# Patient Record
Sex: Male | Born: 1960 | Race: White | Hispanic: No | Marital: Married | Smoking: Never smoker
Health system: Southern US, Community
[De-identification: ages and names within clinical notes are randomized; demographics above are authoritative.]

## PROBLEM LIST (undated history)

## (undated) DIAGNOSIS — M5414 Radiculopathy, thoracic region: Secondary | ICD-10-CM

## (undated) DIAGNOSIS — K589 Irritable bowel syndrome without diarrhea: Secondary | ICD-10-CM

## (undated) DIAGNOSIS — M4802 Spinal stenosis, cervical region: Secondary | ICD-10-CM

## (undated) DIAGNOSIS — R0789 Other chest pain: Secondary | ICD-10-CM

## (undated) DIAGNOSIS — K760 Fatty (change of) liver, not elsewhere classified: Secondary | ICD-10-CM

## (undated) DIAGNOSIS — K219 Gastro-esophageal reflux disease without esophagitis: Secondary | ICD-10-CM

## (undated) DIAGNOSIS — E1110 Type 2 diabetes mellitus with ketoacidosis without coma: Secondary | ICD-10-CM

## (undated) DIAGNOSIS — K7460 Unspecified cirrhosis of liver: Secondary | ICD-10-CM

## (undated) DIAGNOSIS — R197 Diarrhea, unspecified: Secondary | ICD-10-CM

## (undated) DIAGNOSIS — R0600 Dyspnea, unspecified: Secondary | ICD-10-CM

## (undated) DIAGNOSIS — M1990 Unspecified osteoarthritis, unspecified site: Secondary | ICD-10-CM

## (undated) DIAGNOSIS — D696 Thrombocytopenia, unspecified: Secondary | ICD-10-CM

## (undated) DIAGNOSIS — M5410 Radiculopathy, site unspecified: Secondary | ICD-10-CM

## (undated) DIAGNOSIS — N289 Disorder of kidney and ureter, unspecified: Secondary | ICD-10-CM

## (undated) DIAGNOSIS — M5416 Radiculopathy, lumbar region: Secondary | ICD-10-CM

## (undated) DIAGNOSIS — G4730 Sleep apnea, unspecified: Secondary | ICD-10-CM

## (undated) DIAGNOSIS — E119 Type 2 diabetes mellitus without complications: Secondary | ICD-10-CM

## (undated) DIAGNOSIS — M542 Cervicalgia: Secondary | ICD-10-CM

## (undated) DIAGNOSIS — E1142 Type 2 diabetes mellitus with diabetic polyneuropathy: Secondary | ICD-10-CM

## (undated) DIAGNOSIS — M5126 Other intervertebral disc displacement, lumbar region: Secondary | ICD-10-CM

## (undated) DIAGNOSIS — C649 Malignant neoplasm of unspecified kidney, except renal pelvis: Secondary | ICD-10-CM

## (undated) DIAGNOSIS — R319 Hematuria, unspecified: Secondary | ICD-10-CM

## (undated) DIAGNOSIS — M76899 Other specified enthesopathies of unspecified lower limb, excluding foot: Secondary | ICD-10-CM

## (undated) DIAGNOSIS — I10 Essential (primary) hypertension: Secondary | ICD-10-CM

## (undated) DIAGNOSIS — S86301A Unspecified injury of muscle(s) and tendon(s) of peroneal muscle group at lower leg level, right leg, initial encounter: Secondary | ICD-10-CM

## (undated) DIAGNOSIS — M5417 Radiculopathy, lumbosacral region: Secondary | ICD-10-CM

## (undated) DIAGNOSIS — R1013 Epigastric pain: Secondary | ICD-10-CM

## (undated) DIAGNOSIS — C801 Malignant (primary) neoplasm, unspecified: Secondary | ICD-10-CM

## (undated) DIAGNOSIS — N433 Hydrocele, unspecified: Secondary | ICD-10-CM

## (undated) DIAGNOSIS — F419 Anxiety disorder, unspecified: Secondary | ICD-10-CM

## (undated) DIAGNOSIS — S86319A Strain of muscle(s) and tendon(s) of peroneal muscle group at lower leg level, unspecified leg, initial encounter: Secondary | ICD-10-CM

## (undated) DIAGNOSIS — M51369 Other intervertebral disc degeneration, lumbar region without mention of lumbar back pain or lower extremity pain: Secondary | ICD-10-CM

## (undated) DIAGNOSIS — M5136 Other intervertebral disc degeneration, lumbar region: Secondary | ICD-10-CM

## (undated) DIAGNOSIS — A498 Other bacterial infections of unspecified site: Secondary | ICD-10-CM

## (undated) DIAGNOSIS — M48061 Spinal stenosis, lumbar region without neurogenic claudication: Secondary | ICD-10-CM

## (undated) DIAGNOSIS — R51 Headache: Secondary | ICD-10-CM

## (undated) DIAGNOSIS — K30 Functional dyspepsia: Secondary | ICD-10-CM

## (undated) DIAGNOSIS — D691 Qualitative platelet defects: Secondary | ICD-10-CM

## (undated) DIAGNOSIS — R519 Headache, unspecified: Secondary | ICD-10-CM

## (undated) HISTORY — DX: Type 2 diabetes mellitus without complications: E11.9

## (undated) HISTORY — DX: Gastro-esophageal reflux disease without esophagitis: K21.9

## (undated) HISTORY — DX: Functional dyspepsia: K30

## (undated) HISTORY — PX: JOINT REPLACEMENT: SHX530

## (undated) HISTORY — PX: SPINE SURGERY: SHX786

## (undated) HISTORY — DX: Type 2 diabetes mellitus with ketoacidosis without coma: E11.10

## (undated) HISTORY — PX: OSTEOTOMY: SHX137

## (undated) HISTORY — DX: Other intervertebral disc degeneration, lumbar region: M51.36

## (undated) HISTORY — PX: COLONOSCOPY: SHX174

## (undated) HISTORY — DX: Fatty (change of) liver, not elsewhere classified: K76.0

## (undated) HISTORY — DX: Other intervertebral disc displacement, lumbar region: M51.26

## (undated) HISTORY — DX: Other bacterial infections of unspecified site: A49.8

## (undated) HISTORY — DX: Radiculopathy, thoracic region: M54.14

## (undated) HISTORY — PX: OTHER SURGICAL HISTORY: SHX169

## (undated) HISTORY — PX: BACK SURGERY: SHX140

## (undated) HISTORY — DX: Other specified enthesopathies of unspecified lower limb, excluding foot: M76.899

## (undated) HISTORY — DX: Malignant (primary) neoplasm, unspecified: C80.1

## (undated) HISTORY — DX: Essential (primary) hypertension: I10

## (undated) HISTORY — DX: Radiculopathy, lumbar region: M54.16

## (undated) HISTORY — DX: Radiculopathy, lumbosacral region: M54.17

## (undated) HISTORY — DX: Unspecified osteoarthritis, unspecified site: M19.90

## (undated) HISTORY — DX: Unspecified cirrhosis of liver: K74.60

## (undated) SURGERY — LEFT HEART CATH AND CORONARY ANGIOGRAPHY
Anesthesia: Moderate Sedation | Laterality: Right

---

## 2004-06-19 ENCOUNTER — Ambulatory Visit: Payer: Self-pay | Admitting: Family Medicine

## 2004-06-28 ENCOUNTER — Ambulatory Visit (HOSPITAL_COMMUNITY): Admission: RE | Admit: 2004-06-28 | Discharge: 2004-06-28 | Payer: Self-pay | Admitting: Neurosurgery

## 2006-11-28 ENCOUNTER — Ambulatory Visit: Payer: Self-pay

## 2007-06-16 ENCOUNTER — Ambulatory Visit: Payer: Self-pay | Admitting: Internal Medicine

## 2008-07-21 ENCOUNTER — Ambulatory Visit: Payer: Self-pay | Admitting: Unknown Physician Specialty

## 2008-08-31 ENCOUNTER — Ambulatory Visit: Payer: Self-pay | Admitting: Unknown Physician Specialty

## 2009-06-06 ENCOUNTER — Ambulatory Visit: Payer: Self-pay | Admitting: Internal Medicine

## 2010-05-14 DIAGNOSIS — C801 Malignant (primary) neoplasm, unspecified: Secondary | ICD-10-CM

## 2010-05-14 HISTORY — DX: Malignant (primary) neoplasm, unspecified: C80.1

## 2011-02-08 ENCOUNTER — Ambulatory Visit: Payer: Self-pay | Admitting: Family Medicine

## 2011-02-08 ENCOUNTER — Emergency Department: Payer: Self-pay | Admitting: Internal Medicine

## 2011-02-26 ENCOUNTER — Ambulatory Visit: Payer: Self-pay | Admitting: Urology

## 2011-03-16 ENCOUNTER — Ambulatory Visit: Payer: Self-pay | Admitting: Urology

## 2011-03-23 ENCOUNTER — Ambulatory Visit: Payer: Self-pay | Admitting: Urology

## 2011-04-12 ENCOUNTER — Ambulatory Visit: Payer: Self-pay | Admitting: Urology

## 2011-04-14 HISTORY — PX: NEPHRECTOMY: SHX65

## 2011-06-29 ENCOUNTER — Ambulatory Visit: Payer: Self-pay | Admitting: Unknown Physician Specialty

## 2011-07-03 LAB — PATHOLOGY REPORT

## 2011-07-24 DIAGNOSIS — M5414 Radiculopathy, thoracic region: Secondary | ICD-10-CM

## 2011-07-24 DIAGNOSIS — E119 Type 2 diabetes mellitus without complications: Secondary | ICD-10-CM | POA: Insufficient documentation

## 2011-07-24 HISTORY — DX: Radiculopathy, thoracic region: M54.14

## 2011-07-24 HISTORY — DX: Type 2 diabetes mellitus without complications: E11.9

## 2011-08-23 DIAGNOSIS — M48061 Spinal stenosis, lumbar region without neurogenic claudication: Secondary | ICD-10-CM | POA: Insufficient documentation

## 2011-08-23 DIAGNOSIS — M5136 Other intervertebral disc degeneration, lumbar region: Secondary | ICD-10-CM | POA: Insufficient documentation

## 2011-08-23 DIAGNOSIS — Q602 Renal agenesis, unspecified: Secondary | ICD-10-CM | POA: Insufficient documentation

## 2011-08-23 DIAGNOSIS — M51369 Other intervertebral disc degeneration, lumbar region without mention of lumbar back pain or lower extremity pain: Secondary | ICD-10-CM

## 2011-08-23 HISTORY — DX: Other intervertebral disc degeneration, lumbar region without mention of lumbar back pain or lower extremity pain: M51.369

## 2011-08-23 HISTORY — DX: Other intervertebral disc degeneration, lumbar region: M51.36

## 2011-09-05 DIAGNOSIS — M5416 Radiculopathy, lumbar region: Secondary | ICD-10-CM

## 2011-09-05 DIAGNOSIS — M5126 Other intervertebral disc displacement, lumbar region: Secondary | ICD-10-CM | POA: Insufficient documentation

## 2011-09-05 DIAGNOSIS — M51369 Other intervertebral disc degeneration, lumbar region without mention of lumbar back pain or lower extremity pain: Secondary | ICD-10-CM

## 2011-09-05 HISTORY — DX: Other intervertebral disc displacement, lumbar region: M51.26

## 2011-09-05 HISTORY — DX: Radiculopathy, lumbar region: M54.16

## 2011-09-05 HISTORY — DX: Other intervertebral disc degeneration, lumbar region without mention of lumbar back pain or lower extremity pain: M51.369

## 2011-10-30 ENCOUNTER — Ambulatory Visit: Payer: Self-pay | Admitting: Unknown Physician Specialty

## 2011-11-06 ENCOUNTER — Ambulatory Visit: Payer: Self-pay | Admitting: Unknown Physician Specialty

## 2011-12-18 DIAGNOSIS — M545 Low back pain, unspecified: Secondary | ICD-10-CM | POA: Insufficient documentation

## 2012-02-14 DIAGNOSIS — K30 Functional dyspepsia: Secondary | ICD-10-CM | POA: Insufficient documentation

## 2012-02-14 HISTORY — DX: Functional dyspepsia: K30

## 2012-02-18 ENCOUNTER — Ambulatory Visit: Payer: Self-pay | Admitting: Internal Medicine

## 2012-02-27 ENCOUNTER — Emergency Department: Payer: Self-pay | Admitting: Emergency Medicine

## 2012-02-27 LAB — COMPREHENSIVE METABOLIC PANEL
Albumin: 3.8 g/dL (ref 3.4–5.0)
Alkaline Phosphatase: 128 U/L (ref 50–136)
Anion Gap: 11 (ref 7–16)
BUN: 21 mg/dL — ABNORMAL HIGH (ref 7–18)
Bilirubin,Total: 0.5 mg/dL (ref 0.2–1.0)
Calcium, Total: 8.8 mg/dL (ref 8.5–10.1)
Chloride: 104 mmol/L (ref 98–107)
Co2: 24 mmol/L (ref 21–32)
Creatinine: 1.42 mg/dL — ABNORMAL HIGH (ref 0.60–1.30)
EGFR (African American): >60
EGFR (Non-African Amer.): 58 — ABNORMAL LOW
Glucose: 249 mg/dL — ABNORMAL HIGH (ref 65–99)
Osmolality: 289 (ref 275–301)
Potassium: 4.3 mmol/L (ref 3.5–5.1)
SGOT(AST): 34 U/L (ref 15–37)
SGPT (ALT): 53 U/L (ref 12–78)
Sodium: 139 mmol/L (ref 136–145)
Total Protein: 7.9 g/dL (ref 6.4–8.2)

## 2012-02-27 LAB — TROPONIN I: Troponin-I: <0.02 ng/mL

## 2012-02-27 LAB — CBC
HCT: 44 % (ref 40.0–52.0)
HGB: 15.3 g/dL (ref 13.0–18.0)
MCH: 30.1 pg (ref 26.0–34.0)
MCHC: 34.8 g/dL (ref 32.0–36.0)
MCV: 86 fL (ref 80–100)
Platelet: 211 10*3/uL (ref 150–440)
RBC: 5.09 10*6/uL (ref 4.40–5.90)
RDW: 13.7 % (ref 11.5–14.5)
WBC: 7.1 10*3/uL (ref 3.8–10.6)

## 2012-02-27 LAB — LIPASE, BLOOD: Lipase: 248 U/L (ref 73–393)

## 2012-02-27 LAB — CK TOTAL AND CKMB (NOT AT ARMC)
CK, Total: 125 U/L (ref 35–232)
CK-MB: 1 ng/mL (ref 0.5–3.6)

## 2012-03-31 ENCOUNTER — Ambulatory Visit: Payer: Self-pay | Admitting: Rheumatology

## 2012-04-03 ENCOUNTER — Ambulatory Visit: Payer: Self-pay | Admitting: Unknown Physician Specialty

## 2012-04-07 ENCOUNTER — Ambulatory Visit: Payer: Self-pay | Admitting: Unknown Physician Specialty

## 2012-05-02 ENCOUNTER — Ambulatory Visit: Payer: Self-pay | Admitting: Physical Medicine and Rehabilitation

## 2012-05-26 ENCOUNTER — Emergency Department: Payer: Self-pay | Admitting: Emergency Medicine

## 2012-05-26 LAB — COMPREHENSIVE METABOLIC PANEL
Albumin: 4 g/dL (ref 3.4–5.0)
Alkaline Phosphatase: 119 U/L (ref 50–136)
Anion Gap: 8 (ref 7–16)
BUN: 18 mg/dL (ref 7–18)
Bilirubin,Total: 0.8 mg/dL (ref 0.2–1.0)
Calcium, Total: 9.4 mg/dL (ref 8.5–10.1)
Chloride: 100 mmol/L (ref 98–107)
Co2: 25 mmol/L (ref 21–32)
Creatinine: 1.38 mg/dL — ABNORMAL HIGH (ref 0.60–1.30)
EGFR (African American): >60
EGFR (Non-African Amer.): 60 — ABNORMAL LOW
Glucose: 189 mg/dL — ABNORMAL HIGH (ref 65–99)
Osmolality: 273 (ref 275–301)
Potassium: 4.1 mmol/L (ref 3.5–5.1)
SGOT(AST): 59 U/L — ABNORMAL HIGH (ref 15–37)
SGPT (ALT): 99 U/L — ABNORMAL HIGH (ref 12–78)
Sodium: 133 mmol/L — ABNORMAL LOW (ref 136–145)
Total Protein: 8.5 g/dL — ABNORMAL HIGH (ref 6.4–8.2)

## 2012-05-26 LAB — URINALYSIS, COMPLETE
Bacteria: NONE SEEN
Bilirubin,UR: NEGATIVE
Blood: NEGATIVE
Glucose,UR: >=500 mg/dL (ref 0–75)
Leukocyte Esterase: NEGATIVE
Nitrite: NEGATIVE
Ph: 6 (ref 4.5–8.0)
Protein: 30
RBC,UR: 2 /HPF (ref 0–5)
Specific Gravity: 1.025 (ref 1.003–1.030)
Squamous Epithelial: NONE SEEN
WBC UR: 1 /HPF (ref 0–5)

## 2012-05-26 LAB — CBC
HCT: 49.2 % (ref 40.0–52.0)
HGB: 16.3 g/dL (ref 13.0–18.0)
MCH: 29.5 pg (ref 26.0–34.0)
MCHC: 33.1 g/dL (ref 32.0–36.0)
MCV: 89 fL (ref 80–100)
Platelet: 202 10*3/uL (ref 150–440)
RBC: 5.52 10*6/uL (ref 4.40–5.90)
RDW: 13.9 % (ref 11.5–14.5)
WBC: 8 10*3/uL (ref 3.8–10.6)

## 2012-05-26 LAB — LIPASE, BLOOD: Lipase: 282 U/L (ref 73–393)

## 2012-05-26 LAB — CK TOTAL AND CKMB (NOT AT ARMC)
CK, Total: 82 U/L (ref 35–232)
CK-MB: <0.5 ng/mL — ABNORMAL LOW (ref 0.5–3.6)

## 2012-05-26 LAB — TROPONIN I: Troponin-I: <0.02 ng/mL

## 2012-05-27 LAB — TROPONIN I: Troponin-I: <0.02 ng/mL

## 2012-07-11 ENCOUNTER — Ambulatory Visit: Payer: Self-pay | Admitting: Unknown Physician Specialty

## 2012-09-10 ENCOUNTER — Emergency Department: Payer: Self-pay | Admitting: Emergency Medicine

## 2012-09-27 ENCOUNTER — Ambulatory Visit: Payer: Self-pay | Admitting: Internal Medicine

## 2012-09-27 LAB — SYNOVIAL CELL COUNT + DIFF, W/ CRYSTALS
Basophil: 0 %
Crystals, Joint Fluid: NONE SEEN
Eosinophil: 5 %
Lymphocytes: 11 %
Neutrophils: 84 %
Nucleated Cell Count: 2553 /mm3
Other Cells BF: 0 %
Other Mononuclear Cells: 0 %

## 2012-10-01 LAB — BODY FLUID CULTURE

## 2012-10-11 ENCOUNTER — Ambulatory Visit: Payer: Self-pay | Admitting: Emergency Medicine

## 2012-10-15 ENCOUNTER — Ambulatory Visit: Payer: Self-pay | Admitting: Unknown Physician Specialty

## 2012-10-16 ENCOUNTER — Ambulatory Visit: Payer: Self-pay

## 2012-11-15 ENCOUNTER — Ambulatory Visit: Payer: Self-pay

## 2012-11-15 ENCOUNTER — Emergency Department: Payer: Self-pay | Admitting: Emergency Medicine

## 2012-11-15 LAB — COMPREHENSIVE METABOLIC PANEL
Albumin: 3.7 g/dL (ref 3.4–5.0)
Alkaline Phosphatase: 123 U/L (ref 50–136)
Anion Gap: 8 (ref 7–16)
BUN: 16 mg/dL (ref 7–18)
Bilirubin,Total: 0.6 mg/dL (ref 0.2–1.0)
Calcium, Total: 9.1 mg/dL (ref 8.5–10.1)
Chloride: 108 mmol/L — ABNORMAL HIGH (ref 98–107)
Co2: 26 mmol/L (ref 21–32)
Creatinine: 1.18 mg/dL (ref 0.60–1.30)
EGFR (African American): >60
EGFR (Non-African Amer.): >60
Glucose: 81 mg/dL (ref 65–99)
Osmolality: 283 (ref 275–301)
Potassium: 3.8 mmol/L (ref 3.5–5.1)
SGOT(AST): 24 U/L (ref 15–37)
SGPT (ALT): 31 U/L (ref 12–78)
Sodium: 142 mmol/L (ref 136–145)
Total Protein: 7.7 g/dL (ref 6.4–8.2)

## 2012-11-15 LAB — URINALYSIS, COMPLETE
Bacteria: NONE SEEN
Bilirubin,UR: NEGATIVE
Blood: NEGATIVE
Glucose,UR: NEGATIVE mg/dL (ref 0–75)
Hyaline Cast: 1
Ketone: NEGATIVE
Leukocyte Esterase: NEGATIVE
Nitrite: NEGATIVE
Ph: 5 (ref 4.5–8.0)
Protein: NEGATIVE
RBC,UR: 2 /HPF (ref 0–5)
Specific Gravity: 1.018 (ref 1.003–1.030)
Squamous Epithelial: 1
WBC UR: 1 /HPF (ref 0–5)

## 2012-11-15 LAB — CBC
HCT: 44 % (ref 40.0–52.0)
HGB: 15.1 g/dL (ref 13.0–18.0)
MCH: 29.8 pg (ref 26.0–34.0)
MCHC: 34.2 g/dL (ref 32.0–36.0)
MCV: 87 fL (ref 80–100)
Platelet: 165 10*3/uL (ref 150–440)
RBC: 5.06 10*6/uL (ref 4.40–5.90)
RDW: 13.8 % (ref 11.5–14.5)
WBC: 6.8 10*3/uL (ref 3.8–10.6)

## 2012-11-15 LAB — LIPASE, BLOOD: Lipase: 146 U/L (ref 73–393)

## 2012-11-15 LAB — PROTIME-INR
INR: 1
Prothrombin Time: 13 secs (ref 11.5–14.7)

## 2012-11-15 LAB — TROPONIN I: Troponin-I: <0.02 ng/mL

## 2012-11-15 LAB — APTT: Activated PTT: 28.1 secs (ref 23.6–35.9)

## 2012-12-03 ENCOUNTER — Encounter: Payer: Self-pay | Admitting: Family Medicine

## 2012-12-12 ENCOUNTER — Encounter: Payer: Self-pay | Admitting: Family Medicine

## 2013-01-12 ENCOUNTER — Encounter: Payer: Self-pay | Admitting: Family Medicine

## 2013-05-15 ENCOUNTER — Ambulatory Visit: Payer: Self-pay | Admitting: Physician Assistant

## 2013-05-15 LAB — RAPID STREP-A WITH REFLX: Micro Text Report: NEGATIVE

## 2013-05-17 LAB — BETA STREP CULTURE(ARMC)

## 2013-05-25 ENCOUNTER — Ambulatory Visit: Payer: Self-pay | Admitting: Physician Assistant

## 2013-05-25 LAB — RAPID STREP-A WITH REFLX: Micro Text Report: NEGATIVE

## 2013-05-27 LAB — BETA STREP CULTURE(ARMC)

## 2013-06-29 DIAGNOSIS — Z87448 Personal history of other diseases of urinary system: Secondary | ICD-10-CM | POA: Insufficient documentation

## 2013-07-25 ENCOUNTER — Ambulatory Visit: Payer: Self-pay | Admitting: Family Medicine

## 2013-07-25 LAB — URINALYSIS, COMPLETE
Bacteria: NEGATIVE
Bilirubin,UR: NEGATIVE
Glucose,UR: NEGATIVE mg/dL (ref 0–75)
Ketone: NEGATIVE
Leukocyte Esterase: NEGATIVE
Nitrite: NEGATIVE
Ph: 6 (ref 4.5–8.0)
RBC,UR: >30 /HPF (ref 0–5)
Specific Gravity: 1.02 (ref 1.003–1.030)
Squamous Epithelial: NONE SEEN

## 2013-07-25 LAB — RAPID INFLUENZA A&B ANTIGENS

## 2013-08-08 ENCOUNTER — Ambulatory Visit: Payer: Self-pay | Admitting: Family Medicine

## 2013-08-08 LAB — RAPID INFLUENZA A&B ANTIGENS

## 2013-08-26 DIAGNOSIS — M25559 Pain in unspecified hip: Secondary | ICD-10-CM | POA: Insufficient documentation

## 2013-08-26 DIAGNOSIS — M25579 Pain in unspecified ankle and joints of unspecified foot: Secondary | ICD-10-CM | POA: Insufficient documentation

## 2013-10-18 ENCOUNTER — Ambulatory Visit: Payer: Self-pay | Admitting: Emergency Medicine

## 2013-11-12 ENCOUNTER — Ambulatory Visit: Payer: Self-pay

## 2013-11-13 ENCOUNTER — Inpatient Hospital Stay: Payer: Self-pay | Admitting: Internal Medicine

## 2013-11-13 LAB — CBC WITH DIFFERENTIAL/PLATELET
Basophil #: 0 10*3/uL (ref 0.0–0.1)
Basophil %: 0.3 %
Eosinophil #: 0.1 10*3/uL (ref 0.0–0.7)
Eosinophil %: 1 %
HCT: 49.1 % (ref 40.0–52.0)
HGB: 16.8 g/dL (ref 13.0–18.0)
Lymphocyte #: 1.2 10*3/uL (ref 1.0–3.6)
Lymphocyte %: 9.7 %
MCH: 30.2 pg (ref 26.0–34.0)
MCHC: 34.3 g/dL (ref 32.0–36.0)
MCV: 88 fL (ref 80–100)
Monocyte #: 0.6 x10 3/mm (ref 0.2–1.0)
Monocyte %: 5.1 %
Neutrophil #: 10.4 10*3/uL — ABNORMAL HIGH (ref 1.4–6.5)
Neutrophil %: 83.9 %
Platelet: 202 10*3/uL (ref 150–440)
RBC: 5.57 10*6/uL (ref 4.40–5.90)
RDW: 14.2 % (ref 11.5–14.5)
WBC: 12.4 10*3/uL — ABNORMAL HIGH (ref 3.8–10.6)

## 2013-11-13 LAB — COMPREHENSIVE METABOLIC PANEL
Albumin: 4.3 g/dL (ref 3.4–5.0)
Alkaline Phosphatase: 122 U/L — ABNORMAL HIGH
Anion Gap: 8 (ref 7–16)
BUN: 23 mg/dL — ABNORMAL HIGH (ref 7–18)
Bilirubin,Total: 0.6 mg/dL (ref 0.2–1.0)
Calcium, Total: 9.4 mg/dL (ref 8.5–10.1)
Chloride: 105 mmol/L (ref 98–107)
Co2: 22 mmol/L (ref 21–32)
Creatinine: 1.21 mg/dL (ref 0.60–1.30)
EGFR (African American): >60
EGFR (Non-African Amer.): >60
Glucose: 175 mg/dL — ABNORMAL HIGH (ref 65–99)
Osmolality: 278 (ref 275–301)
Potassium: 4.4 mmol/L (ref 3.5–5.1)
SGOT(AST): 37 U/L (ref 15–37)
SGPT (ALT): 40 U/L (ref 12–78)
Sodium: 135 mmol/L — ABNORMAL LOW (ref 136–145)
Total Protein: 9.1 g/dL — ABNORMAL HIGH (ref 6.4–8.2)

## 2013-11-13 LAB — URINALYSIS, COMPLETE
Bacteria: NEGATIVE
Bilirubin,UR: NEGATIVE
Blood: NEGATIVE
Glucose,UR: NEGATIVE mg/dL (ref 0–75)
Ketone: NEGATIVE
Leukocyte Esterase: NEGATIVE
Nitrite: NEGATIVE
Ph: 6 (ref 4.5–8.0)
Protein: 100
Specific Gravity: 1.025 (ref 1.003–1.030)

## 2013-11-13 LAB — CLOSTRIDIUM DIFFICILE(ARMC)

## 2013-11-13 LAB — LIPASE, BLOOD: Lipase: 194 U/L (ref 73–393)

## 2013-11-15 LAB — CBC WITH DIFFERENTIAL/PLATELET
Basophil #: 0 10*3/uL (ref 0.0–0.1)
Basophil %: 0.4 %
Eosinophil #: 0.2 10*3/uL (ref 0.0–0.7)
Eosinophil %: 4.6 %
HCT: 43 % (ref 40.0–52.0)
HGB: 14.8 g/dL (ref 13.0–18.0)
Lymphocyte #: 1.5 10*3/uL (ref 1.0–3.6)
Lymphocyte %: 37.9 %
MCH: 30.4 pg (ref 26.0–34.0)
MCHC: 34.3 g/dL (ref 32.0–36.0)
MCV: 89 fL (ref 80–100)
Monocyte #: 0.3 x10 3/mm (ref 0.2–1.0)
Monocyte %: 7.6 %
Neutrophil #: 1.9 10*3/uL (ref 1.4–6.5)
Neutrophil %: 49.5 %
Platelet: 165 10*3/uL (ref 150–440)
RBC: 4.87 10*6/uL (ref 4.40–5.90)
RDW: 13.8 % (ref 11.5–14.5)
WBC: 3.9 10*3/uL (ref 3.8–10.6)

## 2013-11-15 LAB — WBCS, STOOL

## 2013-11-15 LAB — STOOL CULTURE

## 2013-11-16 LAB — BASIC METABOLIC PANEL
Anion Gap: 6 — ABNORMAL LOW (ref 7–16)
BUN: 14 mg/dL (ref 7–18)
Calcium, Total: 8.3 mg/dL — ABNORMAL LOW (ref 8.5–10.1)
Chloride: 108 mmol/L — ABNORMAL HIGH (ref 98–107)
Co2: 27 mmol/L (ref 21–32)
Creatinine: 1.2 mg/dL (ref 0.60–1.30)
EGFR (African American): >60
EGFR (Non-African Amer.): >60
Glucose: 111 mg/dL — ABNORMAL HIGH (ref 65–99)
Osmolality: 282 (ref 275–301)
Potassium: 4 mmol/L (ref 3.5–5.1)
Sodium: 141 mmol/L (ref 136–145)

## 2013-11-26 ENCOUNTER — Inpatient Hospital Stay: Payer: Self-pay | Admitting: Internal Medicine

## 2013-11-26 LAB — URINALYSIS, COMPLETE
Bacteria: NONE SEEN
Bilirubin,UR: NEGATIVE
Blood: NEGATIVE
Glucose,UR: NEGATIVE mg/dL (ref 0–75)
Ketone: NEGATIVE
Leukocyte Esterase: NEGATIVE
Nitrite: NEGATIVE
Ph: 5 (ref 4.5–8.0)
Protein: NEGATIVE
RBC,UR: NONE SEEN /HPF (ref 0–5)
Specific Gravity: 1.026 (ref 1.003–1.030)
Squamous Epithelial: NONE SEEN
WBC UR: 1 /HPF (ref 0–5)

## 2013-11-26 LAB — CBC WITH DIFFERENTIAL/PLATELET
Basophil #: 0 10*3/uL (ref 0.0–0.1)
Basophil %: 0.2 %
Eosinophil #: 0.1 10*3/uL (ref 0.0–0.7)
Eosinophil %: 0.9 %
HCT: 50.2 % (ref 40.0–52.0)
HGB: 16.6 g/dL (ref 13.0–18.0)
Lymphocyte #: 0.9 10*3/uL — ABNORMAL LOW (ref 1.0–3.6)
Lymphocyte %: 8.8 %
MCH: 29.8 pg (ref 26.0–34.0)
MCHC: 33.1 g/dL (ref 32.0–36.0)
MCV: 90 fL (ref 80–100)
Monocyte #: 0.5 x10 3/mm (ref 0.2–1.0)
Monocyte %: 5.3 %
Neutrophil #: 8.6 10*3/uL — ABNORMAL HIGH (ref 1.4–6.5)
Neutrophil %: 84.8 %
Platelet: 191 10*3/uL (ref 150–440)
RBC: 5.58 10*6/uL (ref 4.40–5.90)
RDW: 13.8 % (ref 11.5–14.5)
WBC: 10.1 10*3/uL (ref 3.8–10.6)

## 2013-11-26 LAB — COMPREHENSIVE METABOLIC PANEL
Albumin: 4.2 g/dL (ref 3.4–5.0)
Alkaline Phosphatase: 90 U/L
Anion Gap: 6 — ABNORMAL LOW (ref 7–16)
BUN: 25 mg/dL — ABNORMAL HIGH (ref 7–18)
Bilirubin,Total: 1 mg/dL (ref 0.2–1.0)
Calcium, Total: 9.5 mg/dL (ref 8.5–10.1)
Chloride: 102 mmol/L (ref 98–107)
Co2: 27 mmol/L (ref 21–32)
Creatinine: 1.29 mg/dL (ref 0.60–1.30)
EGFR (African American): >60
EGFR (Non-African Amer.): >60
Glucose: 138 mg/dL — ABNORMAL HIGH (ref 65–99)
Osmolality: 277 (ref 275–301)
Potassium: 4.2 mmol/L (ref 3.5–5.1)
SGOT(AST): 27 U/L (ref 15–37)
SGPT (ALT): 49 U/L (ref 12–78)
Sodium: 135 mmol/L — ABNORMAL LOW (ref 136–145)
Total Protein: 9 g/dL — ABNORMAL HIGH (ref 6.4–8.2)

## 2013-11-26 LAB — LIPASE, BLOOD: Lipase: 170 U/L (ref 73–393)

## 2013-11-27 LAB — CBC WITH DIFFERENTIAL/PLATELET
Basophil #: 0 10*3/uL (ref 0.0–0.1)
Basophil %: 0.3 %
Eosinophil #: 0.4 10*3/uL (ref 0.0–0.7)
Eosinophil %: 6.2 %
HCT: 41.7 % (ref 40.0–52.0)
HGB: 13.7 g/dL (ref 13.0–18.0)
Lymphocyte #: 2.3 10*3/uL (ref 1.0–3.6)
Lymphocyte %: 37.1 %
MCH: 29.8 pg (ref 26.0–34.0)
MCHC: 32.8 g/dL (ref 32.0–36.0)
MCV: 91 fL (ref 80–100)
Monocyte #: 0.6 x10 3/mm (ref 0.2–1.0)
Monocyte %: 10.2 %
Neutrophil #: 2.9 10*3/uL (ref 1.4–6.5)
Neutrophil %: 46.2 %
Platelet: 154 10*3/uL (ref 150–440)
RBC: 4.59 10*6/uL (ref 4.40–5.90)
RDW: 13.9 % (ref 11.5–14.5)
WBC: 6.3 10*3/uL (ref 3.8–10.6)

## 2013-11-27 LAB — COMPREHENSIVE METABOLIC PANEL
Albumin: 3.2 g/dL — ABNORMAL LOW (ref 3.4–5.0)
Alkaline Phosphatase: 60 U/L
Anion Gap: 0 — ABNORMAL LOW (ref 7–16)
BUN: 20 mg/dL — ABNORMAL HIGH (ref 7–18)
Bilirubin,Total: 0.7 mg/dL (ref 0.2–1.0)
Calcium, Total: 8 mg/dL — ABNORMAL LOW (ref 8.5–10.1)
Chloride: 109 mmol/L — ABNORMAL HIGH (ref 98–107)
Co2: 27 mmol/L (ref 21–32)
Creatinine: 1.23 mg/dL (ref 0.60–1.30)
EGFR (African American): >60
EGFR (Non-African Amer.): >60
Glucose: 112 mg/dL — ABNORMAL HIGH (ref 65–99)
Osmolality: 275 (ref 275–301)
Potassium: 3.9 mmol/L (ref 3.5–5.1)
SGOT(AST): 13 U/L — ABNORMAL LOW (ref 15–37)
SGPT (ALT): 34 U/L (ref 12–78)
Sodium: 136 mmol/L (ref 136–145)
Total Protein: 6.7 g/dL (ref 6.4–8.2)

## 2013-11-30 LAB — PROTIME-INR
INR: 1.1
Prothrombin Time: 14.3 secs (ref 11.5–14.7)

## 2013-11-30 LAB — CBC WITH DIFFERENTIAL/PLATELET
Basophil #: 0 10*3/uL (ref 0.0–0.1)
Basophil %: 0.7 %
Eosinophil #: 0.3 10*3/uL (ref 0.0–0.7)
Eosinophil %: 5.4 %
HCT: 43.7 % (ref 40.0–52.0)
HGB: 14.5 g/dL (ref 13.0–18.0)
Lymphocyte #: 1.3 10*3/uL (ref 1.0–3.6)
Lymphocyte %: 27.1 %
MCH: 29.4 pg (ref 26.0–34.0)
MCHC: 33.2 g/dL (ref 32.0–36.0)
MCV: 89 fL (ref 80–100)
Monocyte #: 0.4 x10 3/mm (ref 0.2–1.0)
Monocyte %: 8 %
Neutrophil #: 2.9 10*3/uL (ref 1.4–6.5)
Neutrophil %: 58.8 %
Platelet: 179 10*3/uL (ref 150–440)
RBC: 4.94 10*6/uL (ref 4.40–5.90)
RDW: 13.4 % (ref 11.5–14.5)
WBC: 4.9 10*3/uL (ref 3.8–10.6)

## 2013-12-02 LAB — PATHOLOGY REPORT

## 2013-12-17 DIAGNOSIS — R197 Diarrhea, unspecified: Secondary | ICD-10-CM | POA: Insufficient documentation

## 2014-01-07 ENCOUNTER — Ambulatory Visit: Payer: Self-pay | Admitting: Physician Assistant

## 2014-01-07 LAB — CLOSTRIDIUM DIFFICILE(ARMC)

## 2014-01-09 LAB — STOOL CULTURE

## 2014-02-04 DIAGNOSIS — A498 Other bacterial infections of unspecified site: Secondary | ICD-10-CM

## 2014-02-04 HISTORY — DX: Other bacterial infections of unspecified site: A49.8

## 2014-02-27 ENCOUNTER — Ambulatory Visit: Payer: Self-pay | Admitting: Physician Assistant

## 2014-02-27 LAB — COMPREHENSIVE METABOLIC PANEL
Albumin: 4 g/dL (ref 3.4–5.0)
Alkaline Phosphatase: 107 U/L
Anion Gap: 9 (ref 7–16)
BUN: 26 mg/dL — ABNORMAL HIGH (ref 7–18)
Bilirubin,Total: 0.5 mg/dL (ref 0.2–1.0)
Calcium, Total: 9.3 mg/dL (ref 8.5–10.1)
Chloride: 104 mmol/L (ref 98–107)
Co2: 28 mmol/L (ref 21–32)
Creatinine: 1.38 mg/dL — ABNORMAL HIGH (ref 0.60–1.30)
EGFR (African American): >60
EGFR (Non-African Amer.): 58 — ABNORMAL LOW
Glucose: 131 mg/dL — ABNORMAL HIGH (ref 65–99)
Osmolality: 288 (ref 275–301)
Potassium: 4 mmol/L (ref 3.5–5.1)
SGOT(AST): 17 U/L (ref 15–37)
SGPT (ALT): 32 U/L
Sodium: 141 mmol/L (ref 136–145)
Total Protein: 7.9 g/dL (ref 6.4–8.2)

## 2014-02-27 LAB — CBC WITH DIFFERENTIAL/PLATELET
Basophil #: 0.1 10*3/uL (ref 0.0–0.1)
Basophil %: 1 %
Eosinophil #: 0.1 10*3/uL (ref 0.0–0.7)
Eosinophil %: 1.8 %
HCT: 46.2 % (ref 40.0–52.0)
HGB: 15.5 g/dL (ref 13.0–18.0)
Lymphocyte #: 1.8 10*3/uL (ref 1.0–3.6)
Lymphocyte %: 32.6 %
MCH: 29.8 pg (ref 26.0–34.0)
MCHC: 33.5 g/dL (ref 32.0–36.0)
MCV: 89 fL (ref 80–100)
Monocyte #: 0.4 x10 3/mm (ref 0.2–1.0)
Monocyte %: 8.1 %
Neutrophil #: 3.1 10*3/uL (ref 1.4–6.5)
Neutrophil %: 56.5 %
Platelet: 182 10*3/uL (ref 150–440)
RBC: 5.2 10*6/uL (ref 4.40–5.90)
RDW: 13.4 % (ref 11.5–14.5)
WBC: 5.5 10*3/uL (ref 3.8–10.6)

## 2014-04-11 ENCOUNTER — Ambulatory Visit: Payer: Self-pay | Admitting: Unknown Physician Specialty

## 2014-04-11 LAB — CLOSTRIDIUM DIFFICILE(ARMC)

## 2014-04-12 ENCOUNTER — Ambulatory Visit: Payer: Self-pay | Admitting: Unknown Physician Specialty

## 2014-04-14 LAB — STOOL CULTURE

## 2014-04-18 ENCOUNTER — Ambulatory Visit: Admit: 2014-04-18 | Disposition: A | Discharge status: Home / Self Care | Payer: Self-pay | Admitting: Internal Medicine

## 2014-04-18 ENCOUNTER — Ambulatory Visit: Payer: Self-pay | Admitting: Internal Medicine

## 2014-05-02 ENCOUNTER — Ambulatory Visit: Payer: Self-pay | Admitting: Physician Assistant

## 2014-05-02 LAB — RAPID STREP-A WITH REFLX: Micro Text Report: NEGATIVE

## 2014-05-05 LAB — BETA STREP CULTURE(ARMC)

## 2014-05-31 ENCOUNTER — Observation Stay: Payer: Self-pay | Admitting: Internal Medicine

## 2014-05-31 LAB — URINALYSIS, COMPLETE
Bilirubin,UR: NEGATIVE
Blood: NEGATIVE
Glucose,UR: 50 mg/dL (ref 0–75)
Hyaline Cast: 2
Ketone: NEGATIVE
Leukocyte Esterase: NEGATIVE
Nitrite: NEGATIVE
Ph: 5 (ref 4.5–8.0)
Protein: 30
RBC,UR: 2 /HPF (ref 0–5)
Specific Gravity: 1.029 (ref 1.003–1.030)
Squamous Epithelial: <1
WBC UR: 3 /HPF (ref 0–5)

## 2014-05-31 LAB — CBC WITH DIFFERENTIAL/PLATELET
Basophil #: 0 10*3/uL (ref 0.0–0.1)
Basophil %: 0.6 %
Eosinophil #: 0.2 10*3/uL (ref 0.0–0.7)
Eosinophil %: 2 %
HCT: 46.6 % (ref 40.0–52.0)
HGB: 15.6 g/dL (ref 13.0–18.0)
Lymphocyte #: 1.8 10*3/uL (ref 1.0–3.6)
Lymphocyte %: 22.9 %
MCH: 29.8 pg (ref 26.0–34.0)
MCHC: 33.5 g/dL (ref 32.0–36.0)
MCV: 89 fL (ref 80–100)
Monocyte #: 0.8 x10 3/mm (ref 0.2–1.0)
Monocyte %: 9.9 %
Neutrophil #: 5 10*3/uL (ref 1.4–6.5)
Neutrophil %: 64.6 %
Platelet: 174 10*3/uL (ref 150–440)
RBC: 5.24 10*6/uL (ref 4.40–5.90)
RDW: 13.8 % (ref 11.5–14.5)
WBC: 7.7 10*3/uL (ref 3.8–10.6)

## 2014-05-31 LAB — LIPASE, BLOOD: Lipase: 231 U/L (ref 73–393)

## 2014-05-31 LAB — COMPREHENSIVE METABOLIC PANEL
Albumin: 3.5 g/dL (ref 3.4–5.0)
Alkaline Phosphatase: 149 U/L — ABNORMAL HIGH
Anion Gap: 8 (ref 7–16)
BUN: 20 mg/dL — ABNORMAL HIGH (ref 7–18)
Bilirubin,Total: 0.4 mg/dL (ref 0.2–1.0)
Calcium, Total: 8.3 mg/dL — ABNORMAL LOW (ref 8.5–10.1)
Chloride: 106 mmol/L (ref 98–107)
Co2: 26 mmol/L (ref 21–32)
Creatinine: 1.1 mg/dL (ref 0.60–1.30)
EGFR (African American): >60
EGFR (Non-African Amer.): >60
Glucose: 167 mg/dL — ABNORMAL HIGH (ref 65–99)
Osmolality: 286 (ref 275–301)
Potassium: 4.1 mmol/L (ref 3.5–5.1)
SGOT(AST): 32 U/L (ref 15–37)
SGPT (ALT): 40 U/L
Sodium: 140 mmol/L (ref 136–145)
Total Protein: 7.4 g/dL (ref 6.4–8.2)

## 2014-05-31 LAB — TROPONIN I: Troponin-I: <0.02 ng/mL

## 2014-06-01 LAB — CBC WITH DIFFERENTIAL/PLATELET
Basophil #: 0 10*3/uL (ref 0.0–0.1)
Basophil %: 0.4 %
Eosinophil #: 0.1 10*3/uL (ref 0.0–0.7)
Eosinophil %: 0.7 %
HCT: 43.1 % (ref 40.0–52.0)
HGB: 14.3 g/dL (ref 13.0–18.0)
Lymphocyte #: 1.7 10*3/uL (ref 1.0–3.6)
Lymphocyte %: 23.3 %
MCH: 29.5 pg (ref 26.0–34.0)
MCHC: 33.1 g/dL (ref 32.0–36.0)
MCV: 89 fL (ref 80–100)
Monocyte #: 0.5 x10 3/mm (ref 0.2–1.0)
Monocyte %: 7.1 %
Neutrophil #: 5 10*3/uL (ref 1.4–6.5)
Neutrophil %: 68.5 %
Platelet: 162 10*3/uL (ref 150–440)
RBC: 4.85 10*6/uL (ref 4.40–5.90)
RDW: 14 % (ref 11.5–14.5)
WBC: 7.3 10*3/uL (ref 3.8–10.6)

## 2014-06-03 LAB — BASIC METABOLIC PANEL
Anion Gap: 7 (ref 7–16)
BUN: 17 mg/dL (ref 7–18)
Calcium, Total: 8.4 mg/dL — ABNORMAL LOW (ref 8.5–10.1)
Chloride: 108 mmol/L — ABNORMAL HIGH (ref 98–107)
Co2: 26 mmol/L (ref 21–32)
Creatinine: 1.19 mg/dL (ref 0.60–1.30)
EGFR (African American): >60
EGFR (Non-African Amer.): >60
Glucose: 101 mg/dL — ABNORMAL HIGH (ref 65–99)
Osmolality: 283 (ref 275–301)
Potassium: 3.9 mmol/L (ref 3.5–5.1)
Sodium: 141 mmol/L (ref 136–145)

## 2014-06-03 LAB — HEMOGLOBIN: HGB: 14.8 g/dL (ref 13.0–18.0)

## 2014-06-03 LAB — HEMATOCRIT: HCT: 44 % (ref 40.0–52.0)

## 2014-06-03 LAB — STOOL CULTURE

## 2014-07-02 ENCOUNTER — Ambulatory Visit: Payer: Self-pay | Admitting: Gastroenterology

## 2014-07-29 ENCOUNTER — Ambulatory Visit
Admit: 2014-07-29 | Disposition: A | Discharge status: Home / Self Care | Payer: Self-pay | Attending: Gastroenterology | Admitting: Gastroenterology

## 2014-08-04 ENCOUNTER — Encounter
Admit: 2014-08-04 | Disposition: A | Discharge status: Home / Self Care | Payer: Self-pay | Attending: Gastroenterology | Admitting: Gastroenterology

## 2014-08-06 DIAGNOSIS — M542 Cervicalgia: Secondary | ICD-10-CM | POA: Insufficient documentation

## 2014-08-13 ENCOUNTER — Ambulatory Visit
Admit: 2014-08-13 | Disposition: A | Discharge status: Home / Self Care | Payer: Self-pay | Attending: Gastroenterology | Admitting: Gastroenterology

## 2014-08-13 ENCOUNTER — Encounter
Admit: 2014-08-13 | Disposition: A | Discharge status: Home / Self Care | Payer: Self-pay | Attending: Gastroenterology | Admitting: Gastroenterology

## 2014-08-21 ENCOUNTER — Ambulatory Visit
Admit: 2014-08-21 | Disposition: A | Discharge status: Home / Self Care | Payer: Self-pay | Attending: Family Medicine | Admitting: Family Medicine

## 2014-08-21 LAB — RAPID STREP-A WITH REFLX: Micro Text Report: NEGATIVE

## 2014-08-24 LAB — BETA STREP CULTURE(ARMC)

## 2014-08-25 ENCOUNTER — Ambulatory Visit
Admit: 2014-08-25 | Disposition: A | Discharge status: Home / Self Care | Payer: Self-pay | Attending: Gastroenterology | Admitting: Gastroenterology

## 2014-08-25 LAB — CLOSTRIDIUM DIFFICILE(ARMC)

## 2014-08-29 LAB — STOOL CULTURE

## 2014-09-04 NOTE — Consult Note (Signed)
Chief Complaint:  Subjective/Chief Complaint patient seen for lower abdominal pain.  Minimal nausea today, no emesis.  continues to have bilateral lower abdominal pain.   VITAL SIGNS/ANCILLARY NOTES: **Vital Signs.:   18-Jul-15 07:51  Vital Signs Type Q 8hr  Temperature Temperature (F) 97.6  Celsius 36.4  Pulse Pulse 49  Respirations Respirations 18  Systolic BP Systolic BP 056  Diastolic BP (mmHg) Diastolic BP (mmHg) 77  Mean BP 92  Pulse Ox % Pulse Ox % 97  Pulse Ox Activity Level  At rest  Oxygen Delivery Room Air/ 21 %   Brief Assessment:  Cardiac Regular   Respiratory clear BS   Gastrointestinal details normal Soft  Nondistended  Bowel sounds normal  No rebound tenderness  tender to palpation in the upper epigastrum and bilateral lower abdomen   Lab Results: Hepatic:  17-Jul-15 04:43   Bilirubin, Total 0.7  Alkaline Phosphatase 60 (45-117 NOTE: New Reference Range 04/03/13)  SGPT (ALT) 34  SGOT (AST)  13  Total Protein, Serum 6.7  Albumin, Serum  3.2  Routine Chem:  17-Jul-15 04:43   Glucose, Serum  112  BUN  20  Creatinine (comp) 1.23  Sodium, Serum 136  Potassium, Serum 3.9  Chloride, Serum  109  CO2, Serum 27  Calcium (Total), Serum  8.0  Osmolality (calc) 275  eGFR (African American) >60  eGFR (Non-African American) >60 (eGFR values <78m/min/1.73 m2 may be an indication of chronic kidney disease (CKD). Calculated eGFR is useful in patients with stable renal function. The eGFR calculation will not be reliable in acutely ill patients when serum creatinine is changing rapidly. It is not useful in  patients on dialysis. The eGFR calculation may not be applicable to patients at the low and high extremes of body sizes, pregnant women, and vegetarians.)  Anion Gap  0  Routine Hem:  16-Jul-15 10:38   WBC (CBC) 10.1  17-Jul-15 04:43   WBC (CBC) 6.3  RBC (CBC) 4.59  Hemoglobin (CBC) 13.7  Hematocrit (CBC) 41.7  Platelet Count (CBC) 154  MCV 91  MCH  29.8  MCHC 32.8  RDW 13.9  Neutrophil % 46.2  Lymphocyte % 37.1  Monocyte % 10.2  Eosinophil % 6.2  Basophil % 0.3  Neutrophil # 2.9  Lymphocyte # 2.3  Monocyte # 0.6  Eosinophil # 0.4  Basophil # 0.0 (Result(s) reported on 27 Nov 2013 at 05:30AM.)   Radiology Results: XRay:    16-Jul-15 12:56, Abdomen 3 Way Includes PA Chest  Abdomen 3 Way Includes PA Chest   REASON FOR EXAM:    vomiting, recent ? partial obstruction  COMMENTS:       PROCEDURE: DXR - DXR ABDOMEN 3-WAY (INCL PA CXR)  - Nov 26 2013 12:56PM     CLINICAL DATA:  Pain, vomiting.  Question partial obstruction.    EXAM:  ABDOMEN SERIES    COMPARISON:  Prior CT from 11/16/2013    FINDINGS:  The cardiac and mediastinal silhouettes are stable in size and  contour, and remain within normal limits.  The lungs are normally inflated. No airspace consolidation, pleural  effusion, or pulmonary edema is identified. There is no  pneumothorax.    Visualized bowel gas pattern is within normal limits without  evidence of obstruction or ileus. No abnormal bowel wall thickening.  Mild gaseous distention of the stomach noted. Surgical clips overlie  the right mid abdomen. No soft tissue mass. Left-sided  nephrolithiasis noted. No free intraperitoneal air.    No acute osseous  abnormality.     IMPRESSION:  1. Nonobstructive bowel gas pattern with no radiographic evidence of  acute intra-abdominal process.  2. No acute cardiopulmonary abnormality.  3. Left nephrolithiasis.      Electronically Signed    By: Jeannine Boga M.D.    On: 11/26/2013 13:15         Verified By: Neomia Glass, M.D.,  [place]:    16-Jul-15 13:48, US Kidney Bilateral  US Kidney Bilateral   REASON FOR EXAM:    recent stones on ct. now with flank pain, h/o right   nephrectomy  COMMENTS:       PROCEDURE: [place]  - US KIDNEY  - Nov 26 2013  1:48PM     CLINICAL DATA:  recent stones on ct. now with flank pain, h/o  right  nephrectomy    EXAM:  RENAL/URINARY TRACT ULTRASOUND COMPLETE    COMPARISON:  Prior CT from 11/16/2013    FINDINGS:  Right Kidney:    Surgically absent.    Left Kidney:    Length: 14.1 cm. Echogenicity within normal limits. Multiple  echogenic foci seen, the largest of whichmeasures 6 x 7 x 3 mm.  Findings are consistent with nephrolithiasis. No hydronephrosis.    Bladder:    Appears normal for degree of bladder distention.     IMPRESSION:  1. Left renal nephrolithiasis. No sonographic evidence of  hydronephrosis or obstructive uropathy.  2. Absent right kidney.      Electronically Signed    By: Jeannine Boga M.D.    On: 11/26/2013 13:53         Verified By: Neomia Glass, M.D.,   Assessment/Plan:  Assessment/Plan:  Assessment 1) lower abdominalpain and change of bowel habit.   2) history of non-etoh related cirrhosis and grade 2 esophageal varices, fatty liver per patient. not currently on variceal bleeding prophylaxis.   Plan 1) continue current, if no improvement tomorrow, will plan for luminal evaluation via egd and colonoscopy monday with Dr Rayann Heman.   Electronic Signatures: Loistine Simas (MD)  (Signed 18-Jul-15 18:17)  Authored: Chief Complaint, VITAL SIGNS/ANCILLARY NOTES, Brief Assessment, Lab Results, Radiology Results, Assessment/Plan   Last Updated: 18-Jul-15 18:17 by Loistine Simas (MD)

## 2014-09-04 NOTE — Consult Note (Signed)
Pt doing a little better, diarrhea definitely better, still has pain in abd.  Only one loose stool today. VSS afeb. Chest clear, abd tender but better than yesterday.  Making progress.    Electronic Signatures: Manya Silvas (MD)  (Signed on 04-Jul-15 10:02)  Authored  Last Updated: 04-Jul-15 10:02 by Manya Silvas (MD)

## 2014-09-04 NOTE — Consult Note (Signed)
PATIENT NAME:  Anthony Fuller, Anthony Fuller MR#:  [id number] DATE OF BIRTH:  [birth year]  DATE OF CONSULTATION:  11/27/2013  REFERRING PHYSICIAN:  Dustin Flock, MD CONSULTING PHYSICIAN:  Arther Dames, MD  REASON FOR CONSULTATION: Abdominal pain, nausea, vomiting and diarrhea.   HISTORY OF PRESENT ILLNESS: Anthony Fuller is a 54 year old male with a past medical history notable for cirrhosis, diabetes, irritable bowel syndrome and chronic back pain presenting to the Emergency Room for evaluation of nausea, vomiting, diarrhea and abdominal pain. Of note, Anthony Fuller was just recently admitted to the hospital in early July for similar symptoms. At that time he was seen by Dr. Vira Agar with GI who thought this was likely either gastroenteritis versus irritable bowel syndrome.    He had some improvement. However, over the past several days his symptoms have worsened. He feels as though his abdominal pain has worsened and he also developed some episodes of nausea, vomiting and diarrhea.   At this time, the nausea, the vomiting and the diarrhea have totally resolved. His main complaint at this time is abdominal pain. The abdominal pain is bilateral, starts in the right lower quadrant and then it travels up his abdomen towards the right upper quadrant and left upper quadrant regions. He also describes a funny sensation in his epigastrium traveling up into his chest.   He reports that the diarrhea, nausea and vomiting can come on intermittently. He cannot identify any potential triggers for these.   Of note, during the previous hospitalization, he did have a CT of abdomen and pelvis that did not show any etiology for his symptoms. His last upper endoscopy was in 2013. That showed grade II esophageal varices, gastritis and large gastric folds and duodenitis. He also had a colonoscopy in 2013, which showed 2 small sessile polyps and internal hemorrhoids; otherwise, it was a normal exam.   PAST MEDICAL HISTORY: 1.  Cirrhosis  with esophageal varices.  2.  Osteoarthritis.  3.  Chronic back pain.  4.  Gout.  5.  Hypertension.  6.  Diabetes.  7.  Nephrectomy.  8.  Irritable bowel syndrome.   ALLERGIES: DILAUDID AND FLOXIN.   FAMILY HISTORY: No GI malignancy.   SOCIAL HISTORY: He denies alcohol and tobacco.   HOME MEDICATIONS: 1.  Viagra daily.  2.  Tramadol 50 mg t.i.d.  3.  Tizanidine 4 mg t.i.d.  4.  Pioglitazone 50 mg daily.  5.  Omeprazole 40 mg daily.  6.  Metformin 500 mg daily.  7.  Lyrica 75 daily.  8.  Halcion 0.125 sublingual 3 times a day.  9.  Fluticasone nasal spray daily.  10.  Duloxetine 30 mg daily.  11.  Ceftin 500 mg.   PHYSICAL EXAMINATION: VITAL SIGNS: Temperature 97.8, pulse 55, respirations 17, blood pressure 113/74 and 97% on room air.  GENERAL: Alert and oriented times 4.  No acute distress. Appears stated age. HEENT: Normocephalic/atraumatic. Extraocular movements are intact. Anicteric. NECK: Soft, supple. JVP appears normal. No adenopathy. CHEST: Clear to auscultation. No wheeze or crackle. Respirations unlabored. HEART: Regular. No murmur, rub, or gallop.  Normal S1 and S2. ABDOMEN: Positive for mild diffuse tenderness to palpation. Nondistended.  Normal active bowel sounds in all 4 quadrants.  No organomegaly. No masses EXTREMITIES: No swelling, well perfused. SKIN: No rash or lesion. Skin color, texture, turgor normal. NEUROLOGICAL: Grossly intact. PSYCHIATRIC: Normal tone and affect. MUSCULOSKELETAL: No joint swelling or erythema.   DIAGNOSTIC DATA: Laboratory: Sodium 136, creatinine 1.23, BUN 20, potassium 3.9. Lipase is  normal. Liver enzymes are normal. Albumin 3.2. White count 6.3, hemoglobin 13.7, hematocrit 42 and his platelets are 154,000   Imaging: See HPI for recent CT.  He did have a KUB this visit which showed a nonobstructive bowel gas pattern.   He also had an ultrasound of his kidneys that showed left nephrolithiasis with no obstruction.    ASSESSMENT: Nausea, vomiting, diarrhea and abdominal pain: Now that he is in the hospital his nausea, vomiting and diarrhea have now resolved. His one residual symptom is abdominal pain. This abdominal pain is bilateral, right lower quadrant and left lower quadrant, and travels upwards through his abdomen. It is present 24/7, not related to anything. It seems this is more of a chronic condition for Anthony Fuller.   Given his recent CT 2 weeks ago, that was nondiagnostic, and the nature of the pain, I think it is very unlikely that we would find an etiology for this pain on upper endoscopy or colonoscopy. He also had both of these tests back in 2013 with no etiology.   RECOMMENDATIONS: I would like to treat him symptomatically at this time to see if we can get some improvement. This will include a proton pump inhibitors, sucralfate, and an antinausea spasmatic such as Bentyl.   If he does not have improvement over the weekend, then we will likely perform EGD and colonoscopy on Monday. However, I have talked to him and let him know that I feel these studies are likely to be very extremely low yield for these symptoms and I do not expect to find an etiology on these tests.  Thank you for this consult.   ____________________________ Arther Dames, MD mr:sb D: 11/27/2013 14:09:16 ET T: 11/27/2013 14:32:55 ET JOB#: [id number]  cc: Arther Dames, MD, <Dictator> Mellody Life MD ELECTRONICALLY SIGNED 12/03/2013 10:20

## 2014-09-04 NOTE — Consult Note (Signed)
Chief Complaint:  Subjective/Chief Complaint seen for abdominal pain. tolerating po, some epigastric pain and continued bilateral lower abdominal pain.  No evidence of bleeding, no bm overnight.   VITAL SIGNS/ANCILLARY NOTES: **Vital Signs.:   19-Jul-15 07:53  Vital Signs Type Q 8hr  Temperature Temperature (F) 97.7  Celsius 36.5  Temperature Source oral  Pulse Pulse 52  Respirations Respirations 18  Systolic BP Systolic BP 333  Diastolic BP (mmHg) Diastolic BP (mmHg) 71  Mean BP 84  Pulse Ox % Pulse Ox % 94  Pulse Ox Activity Level  At rest  Oxygen Delivery Room Air/ 21 %   Brief Assessment:  Cardiac Regular   Respiratory clear BS   Gastrointestinal details normal Soft  Nondistended  Bowel sounds normal  No rebound tenderness  tender to palpation across the lower abdomen and epigastric area.   Assessment/Plan:  Assessment/Plan:  Assessment 1) abdominal pain, lower and epigastric. uncertain etiology.   Plan 1) egd and colonoscopy.  I have discussed the risks benefits and complications of proceedures to include not limited to bleeding infection perforationa nd sedation and he wishes to proceed.   Electronic Signatures: Loistine Simas (MD)  (Signed 19-Jul-15 15:01)  Authored: Chief Complaint, VITAL SIGNS/ANCILLARY NOTES, Brief Assessment, Assessment/Plan   Last Updated: 19-Jul-15 15:01 by Loistine Simas (MD)

## 2014-09-04 NOTE — Discharge Summary (Signed)
PATIENT NAME:  Anthony Fuller, Anthony Fuller MR#:  [id number] DATE OF BIRTH:  08/29/1960  DATE OF ADMISSION:  11/26/2013 DATE OF DISCHARGE:  11/30/2013  ADMITTING DIAGNOSIS: Abdominal pain in bilateral lower quadrants as well as groin, diarrhea and vomiting.   DISCHARGE DIAGNOSES:  1.  Bilateral abdominal pain, diarrhea and vomiting felt to be due to irritable bowel syndrome. No clear etiology found. The patient has had CT scan during his previous hospitalization. During this hospitalization, he underwent EGD and colonoscopy with no abnormality noted. Gastroenterology feels that this is possibly irritable bowel syndrome. The patient may need referral to a GI clinic at [facility] if symptoms persist.  2.  History of gout.  3.  Osteoarthritis.  4.  Chronic back pain.  5.  History of esophageal varices and liver cirrhosis.  6.  Hypertension.  7.  Diabetes.  8.  Nephrectomy in 2012, right-sided, due to carcinoma.  9.  Back surgery.   PERTINENT LABS AND EVALUATIONS: Lipase was 170. Glucose 138, BUN 25, creatinine 1.29, sodium 135, potassium 4.2, chloride 102, CO2 27, calcium 9.5. LFTs were normal, except albumin of 9. WBC 10.1, hemoglobin 16.6, platelet count 191,000.   Abdominal x-rays showed nonobstructive bowel gas pattern. No acute cardiopulmonary abnormality. Left nephrolithiasis.   Kidney ultrasound showed left renal nephrolithiasis. No sonographic evidence of hydronephrosis. Absent right kidney.   Urinalysis was negative.   EGD showed normal stomach, nodular mucosa in the duodenal bulb, which was biopsied.   Colonoscopy showed internal hemorrhoids. No other pathology was identified.   HOSPITAL COURSE: Please refer to the H and P done by the admitting physician. The patient is a 54 year old white male who was recently hospitalized on 07/03 with same type of abdominal pain. At that time, he was thought to have possible irritable bowel syndrome and discharged home. He had a CT scan at that time which showed  nonspecific edema in his bowel. The patient during this hospitalization presented with same complaints. He was seen in consultation by GI due to persistent symptoms. He underwent an EGD and a colonoscopy, which were nonrevealing. GI recommended trying some Bentyl. The patient may need further evaluation as an outpatient for his persistent symptoms and may need referral to [facility]. At this time, he is doing better and is stable for discharge.   DISCHARGE MEDICATIONS: Omeprazole 40 daily, fluticasone 50 mcg 2 sprays daily, Lyrica 75 p.o. daily, duloxetine 30 daily, tizanidine 4 mg 1 tab p.o. t.i.d., chlordiazepoxide clidinium 5 mg/2.5 two tabs t.i.d. as needed, Viagra 50 mg 1 tab p.o. daily as needed, pioglitazone 15 at bedtime as needed, mupirocin topical 2% apply topically to effected area 3 times a day as needed, tramadol 50 mg 1 tab p.o. t.i.d. as needed, metformin 500 two tabs at bedtime, Lyrica 100 at bedtime, acetaminophen/oxycodone 325/5 mg 1 tab p.o. q. 6 p.r.n. for pain, Lyrica 25/50 mg at bedtime, sucralfate 1 gram 10 mL q. 6, dicyclomine 10 mg 1 tab p.o. q. 8.   DISCHARGE DIET: Low sodium, low fat, low cholesterol, carbohydrate-controlled.  DISCHARGE ACTIVITY: As tolerated.   DISCHARGE FOLLOWUP: Follow up with primary MD in 1 to 2 weeks. Follow with [facility] in 2 to 4 weeks.  TIME SPENT: 35 minutes.  ____________________________ Lafonda Mosses Posey Pronto, MD shp:sb D: 12/01/2013 08:11:18 ET T: 12/01/2013 10:23:38 ET JOB#: [id number]  cc: Claudia Alvizo H. Posey Pronto, MD, <Dictator> Alric Seton MD ELECTRONICALLY SIGNED 12/02/2013 12:31

## 2014-09-04 NOTE — Consult Note (Signed)
PATIENT NAME:  Anthony Fuller, Anthony Fuller MR#:  [id number] DATE OF BIRTH:  20-Aug-1960  DATE OF CONSULTATION:  11/13/2013  REFERRING PHYSICIAN:  Johny Drilling, MD CONSULTING PHYSICIAN:  Manya Silvas, MD  HISTORY OF PRESENT ILLNESS:  The patient is a 54 year old white male known to me with a history of chronic back pain, arthritis, grade I esophageal varices and chronic abdominal pain felt to be probably irritable bowel but he has also had previous right nephrectomy and may have adhesions.   The patient had an onset of severe diarrhea, start off with stomach cramps and diarrhea and vomiting. He ate a ___meal_____ about 6:30 and had the onset of the illness about 2 hours later. He ate creamed potatoes and country fried steak that night. Since then, he has been to the bathroom, maybe 20 times or more, passing watery stools described as gray-brown water accompanied by very bad stomach cramps and chills but no fever. He had vomited several times at the beginning but stopped vomiting after that. He was seen in the ER and the decision was made to hydrate and admit to the hospital. I was asked to see him in consultation.   His last bowel movement was about an hour and a half ago. He says it is still like "turning on a faucet". He has pain and discomfort throughout his abdomen   He was on Augmentin couple of weeks ago for sinus infection and then he developed a rash on his legs, possibly related to a tick bite and he had been on cefuroxime for this. He has a target-like lesion on the right shin. It may be related to that.  He denies any fever. No hematemesis. No new medications.   PAST MEDICAL HISTORY:  1.  Gout.  2.  Osteoarthritis.  3.  Chronic back pain. 4.  Chronic abdominal pain.  5.  A history of grade I varices.  6.  Hypertension.  7.  Diabetes.  8.  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  9.  Back surgery.   ALLERGIES: 1.  DILAUDID  2.  FLOXIN.   MEDICATIONS:   1.   Viagra 50 mg daily as needed.  2.  Tramadol 50 mg 3 times a day p.r.n.  3.  Tizanidine 4 mg 3 times a day.  4.  Simvastatin 20 mg daily at bedtime. 5.  Pioglitazone 15 mg daily at bedtime.  6.  Omeprazole 40 mg a day. 7.  Mupirocin topical ointment twice a day.  8.  Lyrica 50 mg in the morning, 100 mg in the evening.  9.  Metformin 500 mg 2 every evening.  10.  Fluticasone 50 mcg inhalation 2 sprays a day. 11.  Duloxetine 30 mg a day.  12.  Librax 2 capsules 3 times a day.   FAMILY HISTORY:  Positive for hypertension.   SOCIAL HISTORY:  Nonsmoker, nondrinker, married and lives with his wife.   REVIEW OF SYSTEMS:  No fever. He has felt weak and washed out, occasional double vision at that time, but not recently. No changes in hearing. No asthma, wheezing or hemoptysis. No chest pains. No skipping irregular heartbeats No dysuria or hematuria. No polyuria. No known anemia or easy bruising. Does have a rash and a target lesion on the right shin.   PHYSICAL EXAMINATION:  GENERAL:  Somewhat mesomorphic white male in no acute distress.  HEENT:  Sclerae anicteric. Conjunctivae negative. Tongue negative.  HEAD:  Atraumatic.  CHEST:  Clear.  HEART:  No  murmurs, gallops, clicks or rubs.  ABDOMEN:  Bowel sounds are diminished. There is diffuse abdominal tenderness to mild percussion. Fullness in the right upper quadrant.  EXTREMITIES:  A target lesion on the right anterior shin,  no edema.  SKIN:  Warm and dry.  PSYCHIATRIC:  Mood and affect are appropriate.   LABORATORY, DIAGNOSTIC, AND RADIOLOGICAL DATA:  Glucose 175, BUN 23, creatinine 1.21, sodium 135, potassium 4.4, chloride 105, CO2 22, calcium 9.4, lipase 194, total protein 9.1, albumin 4.3, total bilirubin 0.6, alkaline phos 122, SGOT 37, SGPT 40. White blood cells 12.4, hemoglobin 16.8, platelet count 202. Stool shows no Campylobacter antigen and C. diff. test is negative for toxins A and B and negative for GDH antigen. Urinalysis shows  some protein, rare red cells, 0 to 5 white cells. A CAT scan of the abdomen and pelvis with contrast showed that the liver is diffusely decreased in attenuation consistent with fatty infiltration. The right kidney surgically removed. The left kidney is well visualized. No obstructive changes. The bladder is partially distended. Fluid is noted in the distal rectum. Mild prominence of small bowel is seen although no true obstructive changes are noted. Assessment:  Fluid throughout the large and small bowel representing a generalized enteritis.   ASSESSMENT:  Either acute onset of gastroenteritis of a viral nature or probable food poisoning. Recommend stool comprehensive culture as you are doing. Stool white cells and since C. difficile is negative, he can continue the Ceftin for now. If this is food poisoning, most likely it would be staphylococcus followed by Clostridium perfringens. I doubt salmonella. It could be a severe viral gastroenteritis. We will follow with you.   ____________________________ Manya Silvas, MD rte:jm D: 11/13/2013 15:53:53 ET T: 11/13/2013 17:05:30 ET JOB#: [id number]  cc: Manya Silvas, MD, <Dictator> [place], MD Manya Silvas MD ELECTRONICALLY SIGNED 11/24/2013 17:47

## 2014-09-04 NOTE — Consult Note (Signed)
Pt has hx of IBS, his diarrhea is much better, no bowel movement over night.  He is still tender in periumbilical area.  No nausea, vomiting or diarrhea reported.  VSS afebrile WBC 8.6, hgb 11.9. Met C neg except glucose of 180.  Would expect to be improved enough to maybe go home tomorrow.   Electronic Signatures: Manya Silvas (MD) (Signed on 05-Jul-15 11:06)  Authored   Last Updated: 05-Jul-15 11:13 by Manya Silvas (MD)

## 2014-09-04 NOTE — Discharge Summary (Signed)
PATIENT NAME:  Anthony Fuller, Anthony Fuller MR#:  [id number] DATE OF BIRTH:  1961-05-14  DATE OF ADMISSION:  11/13/2013 DATE OF DISCHARGE:  11/17/2013  ADMITTING PHYSICIAN:  Dr. Fritzi Mandes   DISCHARGING PHYSICIAN:  Dr. Gladstone Lighter  PRIMARY CARE PHYSICIAN: Dr. Johny Drilling   CONSULTATIONS IN THE HOSPITAL: GI consultation by Dr. Vira Agar  DISCHARGE DIAGNOSES:  1. Acute gastroenteritis.  2. Irritable bowel syndrome with chronic abdominal pain.  3. Diabetes mellitus.  4. Hypertension.  5. Peripheral neuropathy.  DISCHARGE HOME MEDICATIONS:   1. Omeprazole 40 mg p.o. daily.  2. Flonase nasal spray two sprays each nostril once a day.  3. Ceftin 500 mg p.o. b.i.d. for 6 more days.  4. Lyrica 75 mg p.o. in the evening.  5. Lyrica 25 mg p.o. once a day in the evening.  6. Cymbalta 30 mg p.o. daily.  7. Tizanidine 4 mg p.o. t.i.d.  8. Simvastatin 20 mg p.o. at bedtime.  9. Chlordiazepoxide with clidinium 5 mg/2.5 mg 2 capsules 3 times a day as needed.  10. Viagra 50 mg p.o. daily p.r.n.  11. Actos 15 mg p.o. daily at bedtime.  12. Mupirocin topical ointment 3 times a day.  13. Tramadol 50 mg p.o. t.i.d. p.r.n. for pain.  14. Lyrica 50 mg capsule once a day in the morning.  15. Percocet 5/325 mg q. 6 hours p.r.n. for pain.  16. Hyoscyamine 0.125 mg sublingual 3 times a day for pain.   DISCHARGE DIET:  Carbohydrate-controlled diet.   DISCHARGE ACTIVITY: As tolerated.    FOLLOWUP INSTRUCTIONS:  1. GI followup in 2-3 weeks.  2. PCP followup in 2 weeks.   LABS AND IMAGING STUDIES PRIOR TO DISCHARGE: Sodium 141, potassium 4.0, chloride 108, bicarbonate 27, BUN 14, creatinine 1.2, glucose 111, and calcium of 8.3.   WBC 3.9, hemoglobin 14.8, hematocrit 42.0, platelet count 165,000. CT of the abdomen and pelvis on 11/16/2013 showing normal organs in the abdomen. No free air or free fluid in the abdomen. No focal acute process demonstrated either in the abdomen or pelvis.  Stool cultures negative,  stool for Clostridium difficile is negative. He also had another CT of the abdomen and pelvis on admission which shows possible enteritis and small bowel and large bowel enteritis, status post right nephrectomy and nonobstructing left renal stones. Urinalysis negative for any infection. Lipase normal at 194, ALT 40, AST 37, alkaline phosphatase 122, total bilirubin 0.6, albumin of 4.3.   BRIEF HOSPITAL COURSE: This patient is a 54 year old, obese, Caucasian male with past medical history significant for IBS with chronic lower abdominal pain and chronic back pain, history of hypertension, diabetes, status post right nephrectomy secondary to renal cell carcinoma, presents secondary to abdominal pain and diarrhea.  1. Acute gastroenteritis with diarrhea. Initial CT abdomen showed to have possible enteritis.  Could have been triggered by food poisoning.  The patient was started on empiric Flagyl. His diarrhea, nausea, and vomiting has improved though he has persistent lower abdominal pain. Flagyl is being discontinued as repeat CT did not show any evidence of any inflammation and he is able to tolerate regular diet well. He was also seen by GI who agreed with current management.  2. Lower abdominal pain. The patient does have chronic abdominal pain per Dr. Vira Agar. He has IBS and takes pain medications at home all the time. The patient insisted on further workup.  He had repeat CTs done which did not show any worsening or any changes. The patient also wants to  get a colonoscopy done in the hospital. However, his recent colonoscopy in February 2013 only showed couple of polyps and there was no other acute indication for doing a colonoscopy at this time.  So, he is being started on pain medication, hyoscyamine, antispasmodic anticholinergic drug  and is being discharged home at this time.  3. Recent tick bite and rash. He was seen for possible cellulitis at the urgent care a few days before admission and he is on  Ceftin for the same and he will continue to finish off a 10-day course.   4. Diabetes mellitus. He is on Actos, well-controlled.  5. Peripheral neuropathy.  On Lyrica and Cymbalta.   His course has been otherwise uneventful in the hospital.   DISCHARGE CONDITION: Stable.   DISCHARGE DISPOSITION: Home.   TIME SPENT ON DISCHARGE: 40 minutes.    ____________________________ Gladstone Lighter, MD rk:dd D: 11/17/2013 16:00:13 ET T: 11/17/2013 18:53:03 ET JOB#: [id number]  cc: Gladstone Lighter, MD, <Dictator> Valera Castle, MD Unknown cc   Gladstone Lighter MD ELECTRONICALLY SIGNED 11/19/2013 10:54

## 2014-09-04 NOTE — H&P (Signed)
PATIENT NAME:  JAKOBY, MELENDREZ MR#:  [id number] DATE OF BIRTH:  [birth year]  DATE OF ADMISSION:  11/13/2013  PRIMARY CARE PHYSICIAN: Valera Castle, MD  CHIEF COMPLAINT: Abdominal pain and diarrhea since yesterday.  HISTORY OF PRESENT ILLNESS: Mr. Murdoch is a 54 year old obese Caucasian gentleman with past medical history of chronic back pain, arthritis and history of esophageal varices, who comes to the  Emergency Room after he started having watery stool since last night. The patient said he had noticed some blood on the toilet paper while he wiped it. Denied any frank blood in his stools. He had some abdominal pain and received about 12 mg of IV morphine and 200 mcg of IV fentanyl in the Emergency Room. He also had some nausea and vomiting earlier; however, he has not vomited at this time. His CT of the abdomen shows ileus and consistent also with enteritis. He is being admitted for further evaluation and management. The patient recently was at urgent care, finished up antibiotic of Augmentin for his sinus congestion. He is currently on cefuroxime for some rash in his lower extremities.   PAST MEDICAL HISTORY:  1. Gout.  2. Osteoarthritis.  3. Chronic back pain.  4. History of esophageal varices.  5. Hypertension.  6. Diabetes.  7. Nephrectomy in 2012, right-sided nephrectomy secondary to carcinoma.  8. Back surgery.   ALLERGIES: DILAUDID AND FLOXIN.   MEDICATIONS:  1. Viagra 50 mg p.o. daily as needed.  2. Tramadol 50 mg 3 times a day.  3. Tizanidine 4 mg 3 times a day.  4. Simvastatin 20 mg p.o. daily at bedtime.  5. Pioglitazone 15 mg p.o. daily at bedtime.  6. Omeprazole 40 mg p.o. daily.  7. Mupirocin topical ointment to affected area twice a day.  8. Lyrica 75 mg 1 capsule p.o. daily.  9. Lyrica 25 mg 2 capsules once a day.  10. Lyrica 25 mg 1 capsule once a day.  11. Fluticasone 50 mcg inhalation 2 sprays daily.  12. Duloxetine 30 mg p.o. daily.  13.  Chlordiazepoxide/clidinium 2 capsules 3 times a day.  14. Cefuroxime 500 mg b.i.d., this was started at urgent care on July 2nd for bilateral lower extremity rash.   FAMILY HISTORY: Positive for hypertension.   SOCIAL HISTORY: Married and lives with wife. Nonsmoker, nonalcoholic. Denies any drug use.   REVIEW OF SYSTEMS:  CONSTITUTIONAL: No fever. Positive for fatigue, weakness.  EYES: No blurred or double vision, glaucoma or cataract.  ENT: No tinnitus, ear pain, hearing loss or dysphagia.  RESPIRATORY: No cough, wheeze, hemoptysis. No COPD.  CARDIOVASCULAR: No chest pain, orthopnea, edema, dyspnea or arrhythmias.  GASTROINTESTINAL: Positive for nausea, vomiting and diarrhea along with abdominal pain. Positive for history of hemorrhoids.  GENITOURINARY: No dysuria, hematuria or frequency.  ENDOCRINE: No polyuria, nocturia or thyroid problems.  HEMATOLOGY: No anemia or easy bruising. SKIN: No acne, rash or lesion.  NEUROLOGIC: No CVA, TIA, dysarthria or tremors. PSYCHIATRIC: No anxiety, depression or bipolar disorder.  All other systems reviewed and negative.   PHYSICAL EXAMINATION: GENERAL: The patient is awake, alert, oriented x3, not in acute distress. VITAL SIGNS: Afebrile, pulse is 70, blood pressure is 135/89, saturation is 100% on room air.  HEENT: Atraumatic, normocephalic. Pupils are equal, round and reactive to light and accommodation. EOM intact. Oral mucosa is moist.  NECK: Supple. No JVD. No carotid bruits.  RESPIRATORY: Clear to auscultation bilaterally. No rales, rhonchi, respiratory distress or labored breathing.  CARDIOVASCULAR: Both the heart sounds  are normal. Rate and rhythm regular. PMI not lateralized. Chest nontender. Good pedal pulses, good femoral pulses. No lower extremity edema. ABDOMEN: Soft, obese. Tenderness present diffuse. No guarding, rigidity or organomegaly felt. Bowel sounds are hypoactive.  EXTREMITIES: Good pedal pulses, good femoral pulses. No  lower extremity edema.  SKIN: The patient does have a bulls-eye kind of rash, a "bulls-eye" lesion over on his right lower extremity along with maculopapular rash on bilateral lower extremities. He does have some areas of bug bite as well.   PSYCHIATRIC: The patient is awake, alert, oriented x3.   DIAGNOSTIC DATA:  C. difficile negative.  CT of the abdomen shows fluid throughout the large and small bowel which may represent some generalized enteritis. Mild fullness of the small bowel is noted, and a few loops are present and some generalized enteritis as well.  UA negative for UTI. White count is 12.4.  Sodium 135, BUN 23, creatinine 1.2. LFTs within normal limits. Lipase 194.  ASSESSMENT AND PLAN: The 54 year old Sir Mallis, with history of chronic back pain, history of diabetes and hypertension, who recently had sinus congestion and completed a course of Augmentin about a week ago, comes in now with:   1. Acute enteritis which is suspected due to antibiotic related; however, his C. difficile is negative. The patient has been having large volume diarrhea. Will continue aggressive IV fluids. Empiric Flagyl 500 p.o. t.i.d. I will have Dr. Vira Agar see the patient in consultation. Stool comprehensive has been sent. Will keep the patient n.p.o. except medications, and once diarrhea slows down, will start clear liquid and see how he tolerates. Followup white cell count. 2. Bilateral lower extremity rash with recent bug bite/tick bite and working out in the yard. The patient has "a bulls-eye lesion" it looks like on his right tibial shin. He is already on cefuroxime 500 mg b.i.d. that he was given from urgent care on July 2nd. I will continue that for now and ask the patient to complete the antibiotic. There is no indication to send any serological workup for Lyme disease at this time.  3. Type 2 diabetes. Will continue sliding scale and continue pioglitazone. 4. Hyperlipidemia, on simvastatin. 5.  Hypertension. The patient is not on any blood pressure medication. Will monitor blood pressure and consider starting antihypertensives if needed.  6. Deep vein thrombosis prophylaxis. Subcutaneous heparin t.i.d.   7. Further workup according to the patient's clinical course.  Hospital admission plan was discussed with the patient.   TIME SPENT: 50 minutes.   ____________________________ Hart Rochester Posey Pronto, MD sap:lb D: 11/13/2013 12:54:38 ET T: 11/13/2013 13:41:24 ET JOB#: [id number]  cc: Glendora Clouatre A. Posey Pronto, MD, <Dictator> Valera Castle, MD Ilda Basset MD ELECTRONICALLY SIGNED 11/14/2013 10:36

## 2014-09-04 NOTE — Consult Note (Signed)
Brief Consult Note: Diagnosis: nv/, diarrhea, abd pain.   Patient was seen by consultant.   Consult note dictated.   Recommend further assessment or treatment.   Comments: n/v, diarrhea have all resolved.  Now just with non-specific abd pain ( RLQ and LLQ traveling upward). This is chronic and with recent normal CT, this is very likely functional pain.   Recs: - will try bentyl, carafate, PPI, probiotic - if contiues to have syptoms will consider egd / colon for monday but these studies will likely be low yield.  Electronic Signatures: Arther Dames (MD)  (Signed 17-Jul-15 14:17)  Authored: Brief Consult Note   Last Updated: 17-Jul-15 14:17 by Arther Dames (MD)

## 2014-09-04 NOTE — H&P (Signed)
PATIENT NAME:  Anthony Fuller, Anthony Fuller MR#:  [id number] DATE OF BIRTH:  1960/12/10  DATE OF ADMISSION:  11/26/2013  PRIMARY CARE PHYSICIAN: Valera Castle, MD.  PRIMARY GASTROENTEROLOGIST:  Manya Silvas, MD.  REFERRING EMERGENCY ROOM PHYSICIAN:  Larae Grooms CHIEF COMPLAINT: Diarrhea and vomiting.   HISTORY OF PRESENT ILLNESS:  A 54 year old male with history of gout, osteoarthritis, chronic back pain, esophageal varices and liver cirrhosis, hypertension,  diabetes, nephrectomy done in 2012 because of carcinoma on the right side, back surgery and possible irritable bowel syndrome, who was admitted to hospital on 11/14/2103 with similar symptoms of diarrhea and vomiting and GI consult was called in and they suggested mostly it is IBS or gastroenteritis. After being treated for 3-4 days, he was discharged on 7th of July with advice to follow in GI Clinic. He was fine until now, but then today morning, suddenly started having  abdominal pain, got worse and he started having watery diarrhea with nausea and he vomited also, so decided to come back to Emergency Room. On further questioning, he said that his abdominal pain is chronic. He has since almost last one year and he is on multiple pain medication for irritable bowel syndrome.  Possibility for that, but it was worse today with this cramping and diarrhea. No fever.   REVIEW OF SYSTEMS:  CONSTITUTIONAL: Negative for fever, fatigue, weakness, or abdominal pain. No weight loss or weight gain.  EYES: No blurring, double vision, discharge or redness.  ENT:  No tinnitus, ear pain or hearing loss.  RESPIRATORY: No cough, wheezing, hemoptysis, or shortness of breath.  CARDIOVASCULAR: No chest pain, orthopnea, edema, arrhythmia, palpitations.  GASTROINTESTINAL: The patient has nausea, vomiting and diarrhea.  GENITOURINARY: No dysuria, hematuria, or increased frequency.  ENDOCRINE: No heat or cold intolerance. No excessive sweating.  SKIN: No  acne, rashes, or lesions.   MUSCULOSKELETAL: No pain or swelling in the joints.  NEUROLOGICAL: No numbness, weakness, tremor or vertigo.  PSYCHIATRIC: No anxiety, insomnia, bipolar disorder.   PAST MEDICAL HISTORY: 1.  Gout.  2.  Osteoarthritis. 3.  Chronic back pain.  4.  History of esophageal varices and liver cirrhosis.  5.  Hypertension.  6.  Diabetes.  7.  Nephrectomy in 2012, right-sided because of carcinoma.  8.  Back surgery.  9.  Possible irritable bowel syndrome.  ALLERGIES:  DILAUDID AND FLOXIN.   FAMILY HISTORY: Positive for hypertension.   SOCIAL HISTORY: Married and lives with wife, nonsmoker. No alcohol. Denies any drugs.   HOME MEDICATIONS: 1.  Viagra 50 mg oral tablet once a day.  2.  Tramadol 50 mg oral 3 times a day.  3.  Tizanidine 4 mg oral tablet 3 times a day.  4.  Pioglitazone 15  mg oral once a day.  5.  Omeprazole 40 mg oral once a day.  6.  Mupirocin 2% topical ointment 3 times a day.  7.  Metformin 500 mg oral 2 tablets once a day.  8.  Lyrica 75 mg oral once a day.  9.  Halcion 0.125 mg sublingual 3 times a day.  10.  Fluticasone nasal spray 2 sprays once a day.  11.  Duloxetine 30 mg oral once a day.  12. ceftin 500 mg oral tablet was discharged on that for 2 times a day for 10 days. 13.  Acetaminophen and oxycodone 5 mg tablet every 6 hours.   PHYSICAL EXAMINATION: VITAL SIGNS: In ER, temperature 98, pulse 71, respirations 18, blood pressure 123/77, pulse  oximetry 98% on room air.   GENERAL: The patient is fully alert and oriented to time, place, and person. Obese, no acute distress.  HEENT: Atraumatic.  Conjunctivae pink. Oral mucosa moist.  NECK: Supple. No JVD.  RESPIRATORY: Bilateral equal and clear air entry.  CARDIOVASCULAR: S1, S2 present, regular. No murmur.  ABDOMEN: Soft.  Mild tenderness. No organomegaly felt.  Bowel sounds present.  SKIN: No acne, rashes, or lesions.  MUSCULOSKELETAL: No pain or swelling in the joints.   NEUROLOGICAL: No numbness, weakness, tremor. Power 5/5. Follows commands. No rigidity.  PSYCHIATRIC: Does not appear in any acute psychiatric illness at this time.   IMPORTANT LABORATORY RESULTS:  Glucose 138, BUN 25, creatinine 1.29, sodium 135, potassium is 4.2, chloride is 102, CO2 is 27. Lipase is 170. Total protein 9, albumin 4.2, bilirubin 1, alkaline phosphate 90, SGOT 27 and SGPT 49. WBC 10.1, hemoglobin 16.6, platelet count 191,000 and MCV is 90. Urinalysis is grossly negative.   RADIOLOGY DATA:  Abdomen 3-way x-ray is done. Reported nonobstructive bowel gas pattern with no radiographic evidence of acute intraabdominal process. No acute cardiopulmonary abnormality, left nephrolithiasis and ultrasound kidney bilateral is also done, which showed left renal nephrolithiasis and no sonographic evidence of hydronephrosis.  No  obstructive uropathy and absent right kidney.   ASSESSMENT AND PLAN: A 54 year old male with past medical history of osteoarthritis, chronic back pain, esophageal varices and cirrhosis, has irritable bowel syndrome, and chronic abdominal pain and taking medications, came to Emergency Room as having diarrhea, vomiting, and abdominal pain for the last 1 day.  1.  Gastroenteritis or irritable bowel syndrome. This is a recurrent complaint, as he had had last admission for the same problem. Will admit to any medical floor, give him IV fluids and IV  pain and nausea medications for control and get GI consult for further evaluation. Last time, when GI saw him, planning to do colonoscopy as outpatient. Maybe it can be done as inpatient to avoid recurrent admissions, if we are able to find something and provide better treatment. Will  also give currently Rocephin and metronidazole for covering gastroenteritis if there is any infection. 2.  Diabetes. We will hold oral antidiabetic medication and will just keep on insulin sliding scale coverage as patient is vomiting and has diarrhea.  3.   Gastroesophageal reflux. We will continue Protonix daily.  4.  Chronic pains at home on multiple medications.  Here will keep on morphine IV.  CODE STATUS: Full code.   TOTAL TIME SPENT ON THIS ADMISSION: 50 minutes.    ____________________________ Ceasar Lund Anselm Jungling, MD vgv:ds D: 11/26/2013 17:07:06 ET T: 11/26/2013 17:45:04 ET JOB#: [id number]  cc: Ceasar Lund. Anselm Jungling, MD, <Dictator> Vaughan Basta MD ELECTRONICALLY SIGNED 11/27/2013 12:41

## 2014-09-04 NOTE — Consult Note (Signed)
Pt without diarrhea, stools forming up.  Still has diffuse abd pain.  he has IBS and has significant abd pain on a regular basis.  if repeat CT is neg he likely can go home tomorrow with something for abd pain.    Electronic Signatures: Manya Silvas (MD)  (Signed on 06-Jul-15 17:54)  Authored  Last Updated: 06-Jul-15 17:54 by Manya Silvas (MD)

## 2014-09-12 DIAGNOSIS — K7460 Unspecified cirrhosis of liver: Secondary | ICD-10-CM

## 2014-09-12 DIAGNOSIS — K760 Fatty (change of) liver, not elsewhere classified: Secondary | ICD-10-CM | POA: Insufficient documentation

## 2014-09-12 HISTORY — DX: Fatty (change of) liver, not elsewhere classified: K76.0

## 2014-09-12 HISTORY — DX: Unspecified cirrhosis of liver: K74.60

## 2014-09-12 NOTE — Discharge Summary (Signed)
PATIENT NAME:  Anthony Fuller, FURRY MR#:  [id number] DATE OF BIRTH:  [birth year]  DATE OF ADMISSION:  05/31/2014 DATE OF DISCHARGE:  06/03/2014  ADMITTING DIAGNOSES:  1.  Generalized abdominal pain.  2.  Bright red blood per rectum.  3.  Diabetes mellitus.   DISCHARGE DIAGNOSES:  1.  Generalized diffuse abdominal pain secondary to irritable bowel syndrome versus functional.  2.  Hematochezia secondary to hemorrhoids.   PROCEDURES: None.   CONSULTATIONS: Gastroenterology.   BRIEF HISTORY AND PHYSICAL AND HOSPITAL COURSE: The patient is a 54 year old Caucasian male with a history of liver cirrhosis and esophageal varices, came into the ED with a chief complaint of 2-day history of abdominal pain and bright red blood per rectum. Had some nausea but no vomiting. Does not take any NSAIDs. Please review history and physical for details. The patient was recently admitted in July 2015 with similar complaints of abdominal pain and diarrhea, and at that time his EGD and colonoscopy were normal. The patient was admitted to the hospital. Patient was made n.p.o. Hemoglobin and hematocrit were monitored. The patient was placed on proton pump inhibitor and gastroenterology was consulted. The patient's hemoglobin and hematocrit were monitored closely. No other episodes of lower GI bleed were noticed, though patient was having diffuse generalized abdominal pain. The patient's stool study was negative, and as no other episodes of bleeding were noticed, the hemoglobin being stable and the patient recently had EGD and colonoscopy, no further GI interventions were recommended. CAT scan of the abdomen was done which has revealed no acute pathology. His abdominal pain was thought to be functional, probably from his irritable bowel syndrome. His diet was advanced with no complications. GI has recommended to minimize narcotic use in view of his irritable bowel syndrome, and outpatient followup. The patient was discharged home in  stable condition.  DISCHARGE PHYSICAL EXAMINATION: VITAL SIGNS: Temperature 97.4, pulse 53 to 68, respirations 18, blood pressure 121/74, pulse oximetry 93% on room air.  GENERAL APPEARANCE: Not in acute distress. Moderately built and nourished.  HEENT: Normocephalic, atraumatic. Pupils are equally reacting to light and accommodation. No scleral icterus. No conjunctival injection. No sinus tenderness. No postnasal drip. Moist mucous membranes.  NECK: Supple. No JVD. No thyromegaly. Range of motion is intact.  LUNGS: Clear to auscultation bilaterally. No accessory muscle use and no anterior chest wall tenderness on palpation.  CARDIAC: S1, S2 normal. Regular rate and rhythm. No murmurs.  GASTROINTESTINAL: Soft. Bowel sounds are positive in all 4 quadrants. Nondistended. Minimal abdominal tenderness is present diffusely, but no rebound tenderness. No guarding . No masses.  NEUROLOGIC: Awake, alert, oriented x 3. Cranial nerves II through XII are grossly intact. Motor and sensory are intact. Reflexes are 2+.  EXTREMITIES: No edema. No cyanosis. No clubbing.  SKIN: Warm to touch. Normal turgor. No rashes. No lesions.  MUSCULOSKELETAL: No joint effusion, tenderness, erythema.  PSYCHIATRIC: Normal mood and affect.   LABORATORY DATA AND IMAGING STUDIES: CBC is absolutely normal. Repeat hemoglobin and hematocrit, hemoglobin 14.8, hematocrit 44.0. BMP is also normal.   DISCHARGE MEDICATIONS: Duloxetine 30 mg 1 capsule p.o. once daily, tizanidine 4 mg 1 capsule p.o. 3 times a day, tamsulosin 0.4 mg 1 capsule p.o. once daily, metformin extended release 500 mg 1 tablet p.o. once daily, probiotic 1 capsule p.o. once daily, tramadol 50 mg 1 tablet p.o. every 6 hours as needed for pain, terbinafine 250 mg 1 capsule p.o. once daily, Klor-Con 25 mEq p.o. twice a day, dicyclomine 10 mg 1  capsule p.o. every 8 hours, Percocet 5/325 mg 1 tablet p.o. every 8 hours as needed for severe pain.   DIET: ADA, regular  consistency.   ACTIVITY: As tolerated.  FOLLOWUP: Follow up with primary care physician in a week, GI physician Dr. Thurmond Butts in 1 to 2 weeks.  The diagnosis and plan of care were discussed in detail with the patient. He expressed understanding of the plan.  TOTAL TIME SPENT ON THE DISCHARGE AND COORDINATION OF CARE: 45 minutes.   ____________________________ Nicholes Mango, MD ag:ST D: 06/04/2014 19:32:01 ET T: 06/05/2014 01:34:00 ET JOB#: [id number]  cc: Nicholes Mango, MD, <Dictator> Primary care physician Gastroenterology Nicholes Mango MD ELECTRONICALLY SIGNED 06/05/2014 16:53

## 2014-09-12 NOTE — H&P (Signed)
PATIENT NAME:  Anthony Fuller, Anthony Fuller MR#:  [id number] DATE OF BIRTH:  May 25, 1960  DATE OF ADMISSION:  05/31/2014  PRIMARY CARE PROVIDER: Dr. Kym Groom  CHIEF COMPLAINT: Abdominal pain, bright red blood per rectum.   HISTORY OF PRESENT ILLNESS: A 54 year old Caucasian male patient with a history of liver cirrhosis, esophageal varices, diabetes, chronic back pain and hemorrhoids presents to the Emergency Room complaining of 2 days of diffuse abdominal pain, bright red blood per rectum. The patient's symptoms started with his diffuse abdominal pain, which he describes mostly as cramping and churning sensation. This is diffuse with no focal point. He has also had multiple episodes of bright red blood per rectum along with blood clots in his stool. He has had some nausea, but no vomiting. He does not take any NSAIDs. He does have history of hemorrhoids and chronically notices blood when he wipes, but never had any frank blood in his stool. The patient was admitted in July 2015 to the hospital for similar symptoms of abdominal pain and diarrhea, although he did not have any bleeding at that point. His EGD and colonoscopy were normal. His previous EGD in 2013 did show grade 2 esophageal varices.   The patient mentions that he was diagnosed with irritable bowel syndrome during his prior admission. Has not had any problems, although tends to have mild abdominal pain on and off, nothing like what he has today.   REVIEW OF SYSTEMS: CONSTITUTIONAL: Complains of some fatigue.  EYES: No blurred vision, pain, or redness.  ENT: No tinnitus, ear pain, hearing loss.  RESPIRATORY: No cough, wheeze, hemoptysis.  CARDIOVASCULAR: No chest pain, orthopnea, edema.  GASTROINTESTINAL: Has nausea, but no vomiting. Has abdominal pain and bright red blood per rectum.  GENITOURINARY: No dysuria or hematuria.  ENDOCRINE: No polyuria, nocturia, thyroid problems. HEMATOLOGIC AND LYMPHATIC: No anemia.  INTEGUMENTARY: No acne, rash,  lesion.  MUSCULOSKELETAL: No back pain, arthritis.  NEUROLOGIC: No focal numbness, weakness. PSYCHIATRIC: No anxiety or depression.   PAST MEDICAL HISTORY: 1.  Gout.  2.  Osteoarthritis.  3.  Chronic back pain.  4.  Esophageal varices with liver cirrhosis, although his last EGD in July 2015 showed no varices.  5.  Hypertension.  6.  Diabetes mellitus.  7.  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  8.  Back surgeries.  9.  Irritable bowel syndrome.  10.  Obesity.   ALLERGIES: TO DILAUDID AND FLOXIN.   FAMILY HISTORY: Hypertension.   SOCIAL HISTORY: The patient is married, lives with his wife. Does not smoke. No alcohol. No illicit drug use. Works at [employer].   HOME MEDICATIONS: Omeprazole 40 mg daily and one other medication that he does not remember.   PHYSICAL EXAMINATION: VITAL SIGNS: Shows temperature 97.8, pulse of 66, blood pressure 131/81, saturating 100% on room air.  GENERAL: Obese, Caucasian male patient lying in bed, in mild distress secondary to his abdominal pain.  PSYCHIATRIC: Alert and oriented x3. Mood and affect appropriate. Judgment intact. HEENT: Atraumatic, normocephalic. Oral mucosa moist and pink.  No pallor. No icterus. Pupils bilaterally equal and reactive to light.  NECK: Supple. No thyromegaly. No palpable lymph nodes. Trachea midline. No carotid bruit or JVD.  CARDIOVASCULAR: S1, S2, without any murmurs. Peripheral pulses 2+. No edema.  RESPIRATORY: Normal work of breathing. Clear to auscultation on both sides.  GASTROINTESTINAL: Soft abdomen, tenderness diffusely. No rigidity or guarding. Bowel sounds increased. No hepatosplenomegaly palpable.  GENITOURINARY: No CVA tenderness or bladder distention.  SKIN: Warm and dry.  No petechiae, rash, ulcers.  MUSCULOSKELETAL: No joint swelling, redness, effusion of the large joints. Normal muscle tone.  NEUROLOGICAL: Motor strength 5/5 in upper and lower extremities. Sensation to fine touch intact  all over.  LYMPHATIC: No cervical lymphadenopathy.   LABORATORY STUDIES: Glucose 167, BUN 20, creatinine 1.1, sodium 140, potassium 4.1, chloride 106. AST, ALT, alkaline phosphatase, and bilirubin normal. Troponin less than 0.02.   WBC 7.7, hemoglobin 15.6, platelets of 174,000. Urinalysis with 3+ bacteria, but only 3 WBCs and less than 1 epithelial cells have mucus present.   Abdominal x-ray shows diffuse stool in colon, overall bowel gas pattern unremarkable.   CT scan of the abdomen and pelvis without contrast showed mild thickening of proximal jejunum, likely related to distention or peristalsis. Post right nephrectomy. Slightly prominent prostate gland.   ASSESSMENT AND PLAN: 1.  Bright red blood per rectum in a patient with diffuse abdominal pain. At this point, a CT scan of the abdomen and pelvis is not revealing. He does not have any fever. No increased white count. The patient's hemoglobin is stable. He does not have any tachycardia or hypotension. I suspect his bleeding is from his internal hemorrhoids and the pain likely from his irritable bowel syndrome. Discussed the case with Dr. Rayann Heman of GI who will be seeing the patient for further input regarding the case. We will use IV p.r.n. medications. Further management as per his hemoglobin trend and gastrointestinal input. I do not suspect any major bleed in this patient since his vitals are stable. Hemoglobin is stable. The patient will be admitted under observation.  2.  Diabetes mellitus. Put him on a sliding scale insulin. The patient will be on clear liquid diet.  3.  Deep vein thrombosis prophylaxis with sequential compression devices.   CODE STATUS: FULL CODE.   TIME SPENT TODAY ON THIS CASE: 50 minutes.   ____________________________ Leia Alf Valma Rotenberg, MD srs:sw D: 05/31/2014 11:14:33 ET T: 05/31/2014 13:06:52 ET JOB#: [id number]  cc: Alveta Heimlich R. Nekayla Heider, MD, <Dictator> Valera Castle, MD Unknown cc  Neita Carp  MD ELECTRONICALLY SIGNED 05/31/2014 16:52

## 2014-09-12 NOTE — Consult Note (Signed)
PATIENT NAME:  Anthony Fuller, Anthony Fuller MR#:  [id number] DATE OF BIRTH:  [birth year]  DATE OF CONSULTATION: 05/31/2014   REFERRING PHYSICIAN: Srikar R. Sudini, MD  REASON FOR CONSULTATION: Rectal bleeding and abdominal pain.   HISTORY OF PRESENT ILLNESS: Mr. Beveridge is a 54 year old male with a past medical history notable for IBS, chronic back pain, hypertension, diabetes, nephrectomy, obesity, who is presenting to the emergency room for evaluation of abdominal pain and rectal bleeding. Per the patient, he as had several episodes of a pretty fair amount of bright red blood in the toilet. This started approximately 2 days prior to admission and has had about 5 or 6 total episodes. He also has noted some slightly darker colored stool in the toilet.   Of note, he has had several episodes of presenting to the hospital for evaluation of abdominal pain and diarrhea. He did have a colonoscopy in July of 2015 for abdominal pain in the left lower quadrant and diarrhea. He had internal hemorrhoids and random biopsies were normal. He also had an EGD in July of 2015 for epigastric and abdominal pain, nausea, with vomiting. The findings on there were a normal esophagus, normal stomach, some diffuse nodular mucosa in the duodenal bulb. Had did not have any evidence of esophageal varices or portal hypertensive gastropathy on that study. He has also had multiple CAT scans of the abdomen without any evidence of cirrhosis, portal hypertension, or varices.    PAST MEDICAL HISTORY:  1. Gout.  2. Osteoarthritis.  3. Chronic back pain.  4. Hypertension.  5. Diabetes.  6. Nephrectomy 2012.  7. Irritable bowel syndrome.  8. Obesity.   ALLERGIES: DILAUDID AND FLOXIN.   FAMILY HISTORY: No family history of GI malignancy.   SOCIAL HISTORY: Nonsmoker. Does not drink alcohol.   HOME MEDICATIONS: Omeprazole 40 mg daily.   REVIEW OF SYSTEMS: A 10 system review was conducted and is negative except as stated in the history of  present illness.   PHYSICAL EXAMINATION:  VITAL SIGNS: Temperature 97.8, pulse 57, respirations 17, blood pressure 134/67, pulse oximetry is 99% on room air.   PHYSICAL EXAMINATION: GENERAL: Alert and oriented times 4.  No acute distress. Appears stated age. HEENT: Normocephalic/atraumatic. Extraocular movements are intact. Anicteric. NECK: Soft, supple. JVP appears normal. No adenopathy. CHEST: Clear to auscultation. No wheeze or crackle. Respirations unlabored. HEART: Regular. No murmur, rub, or gallop.  Normal S1 and S2. ABDOMEN: Normoactive bowel sounds, soft, mild diffuse tenderness to palpation, nondistended.   EXTREMITIES: No swelling, well perfused. SKIN: No rash or lesion. Skin color, texture, turgor normal. NEUROLOGICAL: Grossly intact. PSYCHIATRIC: Normal tone and affect. MUSCULOSKELETAL: No joint swelling or erythema.   LABORATORY DATA: Sodium is 140, potassium is 4.1, BUN 20, creatinine 1.10, liver enzymes are normal, except alkaline phosphatase is mildly elevated at 149. Troponins are negative, white count 7.7, hemoglobin is 15.6, hematocrit is 46, platelets are 174, INR is 1.1.   He did have a CT of the abdomen and pelvis, which showed no extra luminal bowel inflammatory process, free fluid, or free air. Appearance the mild thickening of proximal jejunum may be related to incomplete distention/peristalsis. Although changes are mild, enteritis cannot be entirely excluded. Otherwise, unremarkable CT scan.   ASSESSMENT AND PLAN: Rectal bleeding: His hemoglobin is completely normal. He did have internal hemorrhoids on his colonoscopy 6 months ago. This would be the most likely source of the bleeding. If his hemoglobin remains stable and his bleeding resolve, there would be no be no  indication for esophagogastroduodenoscopy or colonoscopy at this time. His CT scan and laboratory testing are also quite reassuring at this time.   RECOMMENDATIONS:  1. Continue to monitor hemoglobin  until stable.  2. If continues to have rectal bleeding or drop in hemoglobin, would recommend a flexible sigmoidoscopy versus a capsule endoscopy.  3. We will check stool studies, given the possibility of enteritis seen on CT scan and the acute onset of these symptoms.   Will follow. Thank you for this consult.  ____________________________ Arther Dames, MD mr:ap D: 05/31/2014 17:08:13 ET T: 05/31/2014 17:35:17 ET JOB#: [id number]  cc: Arther Dames, MD, <Dictator> Mellody Life MD ELECTRONICALLY SIGNED 06/28/2014 15:12

## 2014-09-12 NOTE — Consult Note (Signed)
Details:   - GI Note:  Still with diffuse abd pain today.  NO bleeding.  Stool study negative.    Exam: VSS stable a and o x 4, nad cta, no w/c rrr, no m/r/g abd: obese, very soft, mild diffuse TTP.   A/P  1.) Rectal bleeding: resolved. Hgb stable.   2.) Abd pain:   diffuse, non-specific, out of proportion to exam.  Constant 24/7.   No etiology on CT or labs.  THis is likely functional / IBS.  We arent't actually treating any acute pathology.  - cont bently - advance diet.  - try to miminize narcotics since this is functional / IBS pain.  - outpt f/u regarding functional abd pain.   Electronic Signatures: Arther Dames (MD)  (Signed 20-Jan-16 16:39)  Authored: Details   Last Updated: 20-Jan-16 16:39 by Arther Dames (MD)

## 2014-09-20 ENCOUNTER — Ambulatory Visit: Payer: 59 | Attending: Gastroenterology | Admitting: Physical Therapy

## 2014-09-20 ENCOUNTER — Encounter: Payer: Self-pay | Admitting: Physical Therapy

## 2014-09-20 DIAGNOSIS — R278 Other lack of coordination: Secondary | ICD-10-CM | POA: Diagnosis present

## 2014-09-20 DIAGNOSIS — N8189 Other female genital prolapse: Secondary | ICD-10-CM

## 2014-09-20 DIAGNOSIS — M629 Disorder of muscle, unspecified: Secondary | ICD-10-CM | POA: Insufficient documentation

## 2014-09-20 NOTE — Patient Instructions (Signed)
1) PELVIC FLOOR / KEGEL EXERCISES   Pelvic floor/ Kegel exercises are used to strengthen the muscles in the base of your pelvis that are responsible for supporting your pelvic organs and preventing urine/feces leakage. Based on your therapist's recommendations, they can be performed while standing, sitting, or lying down.  Make yourself aware of this muscle group by using these cues: . Imagine you are in a crowded room and you feel the need to pass gas. Your response is to pull up and in at the rectum. . Close the rectum. Pull the muscles up inside your body,feeling your scrotum lifting as well . Feel the pelvic floor muscles lift as if you were walking into a cold lake.  Marland Kitchen Place your hand on top of your pubic bone. Tighten and draw in the muscles around the anal muscles without squeezing the buttock muscles.   Common Errors: . Breath holding: If you are holding your breath, you may be bearing down against your bladder instead of pulling it up. If you belly bulges up while you are squeezing, you are holding your breath. Be sure to breathe gently in and out while exercising. Counting out loud may help you avoid holding your breath. . Accessory muscle use: You should not see or feel other muscle movement when performing pelvic floor exercises. When done properly, no one can tell that you are performing the exercises. Keep the buttocks, belly and inner thighs relaxed. . Overdoing it: Your muscles can fatigue and stop working for you if you over-exercise. You may actually leak more or feel soreness at the lower abdomen or rectum.  YOUR HOME EXERCISE PROGRAM  QUICK FLICKS:  Position: standing/ sitting . Inhale and then exhale. Then Squeeze the muscle quick and hold  for one second, then let go for one second.  . Perform 10 repetitions,  3 times/day  LONG HOLDS: Position: sidelying in bed or on recliner . Inhale and then exhale. Then squeeze the muscle and count aloud for 10 seconds. Rest with three  long breaths. . Perform 10 repetitions, 3 times/day   _________________________________________________________________________________________  2)  Food diary given to pt to complete for four days. (to address goal for better forming stools). Pt reported he is taking probiotics. Advised pt to take a small serving of yogurt with minimal sugar for breakfast. Explained benefits of probiotics for replenishing gut flora. Pt reported he was on antibiotics for a long period.   3) water intake instead sodas   Pt reported he has been drinking cool sodas at work instead of water because he prefers drinking something cold due sweating and enduring  hot temperature at his work. Pt brings his 2L water w/ ice which melts quickly. PT brainstormed w/ pt and suggested bringing a cooler with ice packs to keep water bottle chilled. Advised pt to refrain from sodas for better management of diabetes and to drink water in addition to mineral water (with electrolytes) for better hydration at work and improved outcome for abdominal complaints.

## 2014-09-20 NOTE — Therapy (Signed)
[place] MAIN [facility] SERVICES [address] [place], [place], [address] Phone: [phone]   Fax:  [phone]  Physical Therapy Evaluation  Patient Details  Name: Anthony Fuller MRN: [id number] Date of Birth: December 08, 1960 Referring Provider:  Josefine Class, MD  Encounter Date: 09/20/2014      PT End of Session - 09/20/14 1019    Visit Number 1   Number of Visits 12   Date for PT Re-Evaluation 11/15/14   PT Start Time 0810   PT Stop Time 0910   PT Time Calculation (min) 60 min   Activity Tolerance Patient tolerated treatment well;No increased pain   Behavior During Therapy [facility] for tasks assessed/performed      Past Medical History  Diagnosis Date  . Arthritis   . GERD (gastroesophageal reflux disease)   . Hypertension   . Cancer     R kidney removed  . Diabetes mellitus without complication     Past Surgical History  Procedure Laterality Date  . Spine surgery      lumbar 2013  . Kidney removed Right     2012    There were no vitals filed for this visit.  Visit Diagnosis:  Weakness of pelvic floor - Plan: PT plan of care cert/re-cert  Coordination abnormal - Plan: PT plan of care cert/re-cert  Fascial defect - Plan: PT plan of care cert/re-cert      Subjective Assessment - 09/20/14 0946    Subjective Pt c/o fecal incontinence associated with chronic abdominal pain. Pt was seen for PT for one visit on 08/04/14 but did not return due to recurring episode of C.diff. Pt has been referred back to PT after completing his medication Tx for C.diff and has returne back to work.    Pertinent History 2-3 episodes of C.diff since March 2016 and has completed medication. Sx of vomiting, loss of appetite, diarhea have resolved. Fecal incontinence remains problematic most of the time. Pt reports wiping after bowel movement but then senses a "wet" sensation  and soiled undergarments one incident last week.  Pt  reported feeling  embrassed at work  due to this episode because he was unable to make it to the bathroom in time. Bristol  Scale Type 6   Patient Stated Goals stop leakage and abdominal pain   Currently in Pain? Yes   Pain Score 6    Pain Location Abdomen   Pain Descriptors / Indicators Other (Comment)  constant churning and stabbing    Pain Onset Other (comment)  since 1980s   Pain Frequency Constant   Effect of Pain on Daily Activities unable to sleep due to pain and neuropathy (N/T) in feet, interrupts work             [facility] PT Assessment - 09/20/14 1017    Assessment   Medical Diagnosis pelvic floor dysfunction, IBS-D   Onset Date 08/04/14  worsening 3-4 months ago, abdominal pain started    Precautions   Precautions None   Restrictions   Weight Bearing Restrictions No   Palpation   Palpation lumbar scar immobility. tenderness over abdomen with palpation UQ and LQ.    noted increased parapsinal and thoracic mm tensions   Bed Mobility   Bed Mobility --  supine>EOB: crunch method, educated pt on log rolling      PLOF:  IND w/ all ADLs prior to fecal incontinence associated with abdominal pain. Out of work during C/ Diff episode but has been treated  with medication and currently returned to work.             Pelvic Floor Special Questions - 09/20/14 0001    Prior Pelvic/Prostate Exam Yes   Fecal incontinence Yes   External Perineal Exam use of accessory mm of glut mm with cue for pelvic floor contraction   Skin Integrity Intact   Pelvic Floor Internal Exam Pt treated for C.Diff 4/13 w/ vanomycin for 14 days. Pt completed medication and no longer experiences Sx. Pt consented to rectal examination.   Exam Type Rectal   Palpation --   Strength fair squeeze, definite lift  Grade 2 initially, Grade 3 post-manual Tx and neuro-reedu   Strength # of reps --  10   Strength # of seconds 10   Tone Noted tenderness and tensions at puborectalis anterior aspect.        Treatment:   Manual Tx: Grade 2 initially. Noted increased tension and tenderness at puborectalis anterior. Post breathing training and sustained pressure, noted decreased mm tension/ tenderness. Pt able to elicit Grade 8/65/78/4 in sidelying.   Neuro-reedu:  Guided Pelvic floor contraction with breathing coordination with tactile, verbal cues along with visual imagery. Explained the physiology of deep core mm coordination and roles of pelvic floor function in urination, defecation, sexual function, and postural control with deep core mm system.            PT Education - 09/20/14 0955    Education provided Yes   Education Details HEP    Person(s) Educated Patient   Methods Explanation;Verbal cues;Tactile cues;Handout;Demonstration   Comprehension Verbalized understanding;Returned demonstration;Tactile cues required;Verbal cues required             PT Long Term Goals - 09/20/14 1028    PT LONG TERM GOAL #1   Title Pt will report a decrease in his trips to the bathroom to check on leakage from 3-4x per work shfit to <2 x and/ or Pt will report no episode of soiling undergarments across 2 weeks in order to improve QOL.    Time 12   Period Weeks   Status New   PT LONG TERM GOAL #2   Title Pt will demonstrate deep core coordination and proper body mechanics with yard work activities  in order to return to performing yard work without SOB and decreased pain.    Time 12   Period Weeks   Status New   PT LONG TERM GOAL #3   Title Pt will report Type 3 or 4 on Bristol Scale for  3-5 days out of one week in order to demonstrate compliance to dietary recommendations and minimize constipation.    Time 12   Period Weeks   Status New   PT LONG TERM GOAL #4   Title Patient will be independent with completion of home exercise program for pelvic floor exercise, progressing to standing for all ADL's/activities.   Time 12   Period Weeks   Status New   PT LONG TERM GOAL #5   Title Pt will report a  decrease in abdominal pain from constant to 50-75% of the time in order to work effectively.   Time 12   Period Days   Status New               Plan - 09/20/14 1021    Clinical Impression Statement Pt is a 54 yo male who c/o chronic abdominal pain and fecal incontinence.   Pt will benefit from skilled therapeutic intervention in  order to improve on the following deficits Abnormal gait;Impaired flexibility;Improper body mechanics;Postural dysfunction;Obesity;Decreased activity tolerance;Increased muscle spasms;Pain;Decreased mobility;Decreased strength;Decreased range of motion;Decreased coordination;Decreased endurance;Decreased scar mobility   Rehab Potential Good   Clinical Impairments Affecting Rehab Potential co-morbidities   PT Frequency 1x / week   PT Duration 12 weeks   PT Treatment/Interventions ADLs/Self Care Home Management;Therapeutic activities;Patient/family education;Scar mobilization;Therapeutic exercise;Other (comment);Manual techniques;Dry needling;Cryotherapy;Moist Heat;Stair training;Neuromuscular re-education;Functional mobility training;Gait training;Balance training  pain science education, relaxation training. toileting posture    PT Next Visit Plan reassess pelvic floor coordination. Assess posterior back and massage lumbar scar   PT Home Exercise Plan progress endurance training, abdominal massage   Consulted and Agree with Plan of Care Patient         Problem List There are no active problems to display for this patient.   Jerl Mina, ,PT, DPT, E-RYT 09/20/2014, 11:20 AM  [place] MAIN [facility] SERVICES [address] [place], [place], [address] Phone: [phone]   Fax:  [phone]

## 2014-10-05 ENCOUNTER — Encounter: Payer: 59 | Admitting: Physical Therapy

## 2014-10-13 ENCOUNTER — Encounter: Payer: 59 | Admitting: Physical Therapy

## 2014-10-19 ENCOUNTER — Encounter: Payer: 59 | Admitting: Physical Therapy

## 2014-10-28 ENCOUNTER — Encounter: Payer: 59 | Admitting: Physical Therapy

## 2014-11-03 ENCOUNTER — Encounter: Payer: 59 | Admitting: Physical Therapy

## 2014-11-09 ENCOUNTER — Encounter: Payer: 59 | Admitting: Physical Therapy

## 2014-11-16 ENCOUNTER — Encounter: Payer: 59 | Admitting: Physical Therapy

## 2014-11-23 ENCOUNTER — Encounter: Payer: 59 | Admitting: Physical Therapy

## 2015-01-16 ENCOUNTER — Ambulatory Visit: Payer: 59

## 2015-01-16 ENCOUNTER — Ambulatory Visit
Admission: EM | Admit: 2015-01-16 | Discharge: 2015-01-16 | Disposition: A | Discharge status: Home / Self Care | Payer: 59 | Attending: Internal Medicine | Admitting: Internal Medicine

## 2015-01-16 DIAGNOSIS — S20211A Contusion of right front wall of thorax, initial encounter: Secondary | ICD-10-CM

## 2015-01-16 LAB — URINALYSIS COMPLETE WITH MICROSCOPIC (ARMC ONLY)
Bacteria, UA: NONE SEEN — AB
Bilirubin Urine: NEGATIVE
Glucose, UA: >1000 mg/dL — AB
Hgb urine dipstick: NEGATIVE
Ketones, ur: NEGATIVE mg/dL
Leukocytes, UA: NEGATIVE
Nitrite: NEGATIVE
Protein, ur: NEGATIVE mg/dL
Specific Gravity, Urine: >1.03 — ABNORMAL HIGH (ref 1.005–1.030)
Squamous Epithelial / LPF: NONE SEEN — AB
pH: 5 (ref 5.0–8.0)

## 2015-01-16 NOTE — ED Notes (Signed)
States fell while taking a shower and hit right lateral ribs/flank area. Happened Friday night. States unable to take deep breath

## 2015-01-20 ENCOUNTER — Encounter: Payer: Self-pay | Admitting: Physician Assistant

## 2015-01-20 NOTE — ED Provider Notes (Signed)
CSN: [id number]     Arrival date & time 01/16/15  1533 History   First MD Initiated Contact with Patient 01/16/15 1702     Chief Complaint  Patient presents with  . Fall   (Consider location/radiation/quality/duration/timing/severity/associated sxs/prior Treatment) HPI  54 yo  M fell 2 days ago. Has had no SOB, cough or wheeze in interim.  Now concerned because of persistent right rib pain to touch and discomfort with deep inhalation. He uses shower inside a bathub with slightly curved floor surface and has diabetic neuropathy in both feet. Does not use a non-skid pad. Using soap and his feet went out from under him, right lateral chest wall hit edge of fiberglas tub and body slid down along edge. He did not strike his head, did not have his breath knocked out. Was immediately able to scramble together and gather himself, get out of tub and then dress himself. Wife is in attendance this evening. Reports that there has been no breathing distress- but persistent touch tenderness. PAteitn has had previous injuries and back issues. He denies that his back has had any increased discomfort or change in ROM. He routinely takes 1-2 Tramadol in the AM and 1-2 Tramadol plus 2 Zanaflex in the PM for back issues. He has maintained that schedule.  Past Medical History  Diagnosis Date  . Arthritis   . GERD (gastroesophageal reflux disease)   . Hypertension   . Cancer     R kidney removed  . Diabetes mellitus without complication    Past Surgical History  Procedure Laterality Date  . Spine surgery      lumbar 2013  . Kidney removed Right     2012  . Back surgery    . Nephrectomy Right 04/2011   Family History  Problem Relation Age of Onset  . Diabetes Mother   . Hypertension Mother   . Asthma Mother   . Heart disease Father   . Kidney disease Father   . Diabetes Father   . Stroke Father    Social History  Substance Use Topics  . Smoking status: Never Smoker   . Smokeless tobacco: Never  Used  . Alcohol Use: No    Review of Systems  Constitutional -afebrile Eyes-denies visual changes ENT- normal voice,denies sore throat CV-denies deep chest pain- but has chest wall pain with rib motion Resp-denies SOB,tenderness with deep inspiration GI- negative for nausea,vomiting, diarrhea GU- negative for dysuria MSK- negative for new back pain,positivelongstanding, ambulatory Skin- denies acute changes Neuro- negative headache,focal weakness or numbness     Allergies  Floxin and Hydromorphone  Home Medications   Prior to Admission medications   Medication Sig Start Date End Date Taking? Authorizing Provider  canagliflozin (INVOKANA) 100 MG TABS tablet Take 100 mg by mouth.   Yes Historical Provider, MD  citalopram (CELEXA) 20 MG tablet Take 20 mg by mouth daily.   Yes Historical Provider, MD  dicyclomine (BENTYL) 10 MG capsule Take 10 mg by mouth 4 (four) times daily -  before meals and at bedtime.   Yes Historical Provider, MD  Multiple Vitamin (MULTIVITAMIN) capsule Take 1 capsule by mouth daily.   Yes Historical Provider, MD  omeprazole (PRILOSEC) 40 MG capsule Take 40 mg by mouth daily.   Yes Historical Provider, MD  pregabalin (LYRICA) 150 MG capsule Take 150 mg by mouth 2 (two) times daily.   Yes Historical Provider, MD  pregabalin (LYRICA) 50 MG capsule Take 50 mg by mouth 3 (three) times daily.  Yes Historical Provider, MD  sucralfate (CARAFATE) 1 G tablet Take 1 g by mouth 4 (four) times daily -  with meals and at bedtime.   Yes Historical Provider, MD  tamsulosin (FLOMAX) 0.4 MG CAPS capsule Take 0.4 mg by mouth.   Yes Historical Provider, MD  terbinafine (LAMISIL) 250 MG tablet Take 250 mg by mouth daily.   Yes Historical Provider, MD  albuterol (PROVENTIL HFA;VENTOLIN HFA) 108 (90 BASE) MCG/ACT inhaler Inhale into the lungs every 6 (six) hours as needed for wheezing or shortness of breath.    Historical Provider, MD  clonazePAM (KLONOPIN) 0.5 MG tablet Take 0.5  mg by mouth 2 (two) times daily as needed for anxiety.    Historical Provider, MD  loratadine (CLARITIN) 10 MG tablet Take 10 mg by mouth daily.    Historical Provider, MD  oxyCODONE-acetaminophen (PERCOCET) 10-325 MG per tablet Take 1 tablet by mouth every 4 (four) hours as needed for pain.    Historical Provider, MD   Meds Ordered and Administered this Visit  Medications - No data to display  BP 141/95 mmHg  Pulse 60  Temp(Src) 96.1 F (35.6 C) (Tympanic)  Resp 16  Ht 6\' 2"  (1.88 m)  Wt 270 lb (122.471 kg)  BMI 34.65 kg/m2  SpO2 99% No data found. Medications - No data to display  Physical Exam   Constitutional -alert and oriented,well appearing,mild distress Head-atraumatic, normocephalic Eyes- conjunctiva normal, EOMI ,conjugate gaze Nose- no congestion or rhinorrhea Mouth/throat- mucous membranes moist , Neck- supple,Nexus criteria neg CV- regular rate, grossly normal heart sounds,  Resp-no distress, normal respiratory effort,clear to auscultation bilaterally; resp regular, chest wall motion symmetric; tenderness in intercostal tissue with touch on deep inspiration- Chest- no bruising noted, but intercostal tenderness in noted right chest laterally, no rib recoil noted Back- low back discomfort with palpation-denies any change from the usual exam, up right flank there is tenderness but no ecchymosis chest wall GI- soft,non-tender,no distention GU- not examined MSK- no tender, normal ROM, all extremities, ambulatory, self-care- reports decreased sensation in feet, pin specific testing not done Neuro- normal speech and language, no gross focal neurological deficit appreciated, no gait instability, Skin-warm,dry ,intact; no rash noted Psych-mood and affect grossly normal; speech and behavior grossly normal ED Course  Procedures (including critical care time)  Labs Review Labs Reviewed  URINALYSIS COMPLETEWITH MICROSCOPIC ([facility] ONLY) - Abnormal; Notable for the following:     Glucose, UA >1000 (*)    Specific Gravity, Urine >1.030 (*)    Bacteria, UA NONE SEEN (*)    Squamous Epithelial / LPF NONE SEEN (*)    All other components within normal limits    Imaging Review No results found.  CXR /Ribs- failed to drop - negative for fracture or pneumothorax  Discussed elevated urine glucose and questioned history and current diabetic status.  He reports he has not been consistently focused on DM management and diet and is encouraged to discuss with PCP. Family is considering an elevated step-in seated bathtub which might an option over the 2 tub showers they currently have Expect bone and intercostal tenderness to require moderate time to repair. MDM   1. Contusion of ribs, right, initial encounter    Plan: 1. Test/x-ray results and diagnosis reviewed with patient and wife. 2. rx as per his standing order for pain meds orders;  3. Recommend supportive treatment with heat pad or ice paks as serves comfort level. 4. Seek additional medical care if symptoms worsen or don't improve-any  experience with SOB or change status. Currently preparing to go out of town a few days    Jan Fireman, Hershal Coria 01/20/15 2119

## 2015-04-11 DIAGNOSIS — M76899 Other specified enthesopathies of unspecified lower limb, excluding foot: Secondary | ICD-10-CM

## 2015-04-11 HISTORY — DX: Other specified enthesopathies of unspecified lower limb, excluding foot: M76.899

## 2015-04-12 ENCOUNTER — Other Ambulatory Visit: Payer: Self-pay | Admitting: Student

## 2015-04-12 DIAGNOSIS — K7460 Unspecified cirrhosis of liver: Secondary | ICD-10-CM

## 2015-04-18 ENCOUNTER — Ambulatory Visit: Payer: 59

## 2015-04-25 ENCOUNTER — Ambulatory Visit
Admission: RE | Admit: 2015-04-25 | Discharge: 2015-04-25 | Disposition: A | Discharge status: Home / Self Care | Payer: 59 | Source: Ambulatory Visit | Attending: Student | Admitting: Student

## 2015-04-25 DIAGNOSIS — K4090 Unilateral inguinal hernia, without obstruction or gangrene, not specified as recurrent: Secondary | ICD-10-CM | POA: Insufficient documentation

## 2015-04-25 DIAGNOSIS — K7460 Unspecified cirrhosis of liver: Secondary | ICD-10-CM

## 2015-04-25 DIAGNOSIS — N200 Calculus of kidney: Secondary | ICD-10-CM | POA: Insufficient documentation

## 2015-04-25 DIAGNOSIS — Z905 Acquired absence of kidney: Secondary | ICD-10-CM | POA: Insufficient documentation

## 2015-04-25 HISTORY — DX: Disorder of kidney and ureter, unspecified: N28.9

## 2015-04-25 MED ORDER — IOHEXOL 350 MG/ML SOLN
100.0000 mL | Freq: Once | INTRAVENOUS | Status: AC | PRN
  Administered 2015-04-25: 100 mL via INTRAVENOUS

## 2015-04-30 ENCOUNTER — Ambulatory Visit
Admission: EM | Admit: 2015-04-30 | Discharge: 2015-04-30 | Disposition: A | Discharge status: Home / Self Care | Payer: 59 | Attending: Internal Medicine | Admitting: Internal Medicine

## 2015-04-30 ENCOUNTER — Encounter: Payer: Self-pay | Admitting: Emergency Medicine

## 2015-04-30 DIAGNOSIS — N200 Calculus of kidney: Secondary | ICD-10-CM | POA: Diagnosis not present

## 2015-04-30 DIAGNOSIS — N23 Unspecified renal colic: Secondary | ICD-10-CM | POA: Diagnosis not present

## 2015-04-30 LAB — URINALYSIS COMPLETE WITH MICROSCOPIC (ARMC ONLY)
Bacteria, UA: NONE SEEN
Bilirubin Urine: NEGATIVE
Glucose, UA: 250 mg/dL — AB
Ketones, ur: NEGATIVE mg/dL
Leukocytes, UA: NEGATIVE
Nitrite: NEGATIVE
Protein, ur: NEGATIVE mg/dL
Specific Gravity, Urine: 1.02 (ref 1.005–1.030)
Squamous Epithelial / LPF: NONE SEEN
pH: 6.5 (ref 5.0–8.0)

## 2015-04-30 MED ORDER — KETOROLAC TROMETHAMINE 60 MG/2ML IM SOLN
60.0000 mg | Freq: Once | INTRAMUSCULAR | Status: AC
  Administered 2015-04-30: 60 mg via INTRAMUSCULAR

## 2015-04-30 MED ORDER — TRAMADOL HCL (ER BIPHASIC) 100 MG PO CP24: 100.0000 mg | ORAL_CAPSULE | Freq: Four times a day (QID) | ORAL | Status: DC | PRN

## 2015-04-30 NOTE — ED Notes (Addendum)
Left side pain for 1 week. Feels like he need to go urinate and cannot

## 2015-04-30 NOTE — Discharge Instructions (Signed)
Prescription for a small quantity of tramadol (#12) 100mg  was printed today. CT scan abdomen/pelvis on 04/25/15 showed small left sided kidney stones, not blocking flow from the left kidney. Push fluids. Followup PCP/Dr Olmedo and pain management doctor for longer term pain medicine needs for flank pain.

## 2015-04-30 NOTE — ED Provider Notes (Signed)
CSN: [id number]     Arrival date & time 04/30/15  0920 History   None    Chief Complaint  Patient presents with  . Muscle Pain   HPI  Patient is a 54 year old gentleman who works on the Cyt[employer] at [employer], physical work, lifting.  He presents today with about a one-week history of pain in the left flank, increasing steadily, constant. He has had a right nephrectomy for kidney cancer, and was concerned about a kidney stone blocking flow to the remaining left kidney. No vomiting, some nausea. Some chills for a couple of nights. Movement does increase discomfort. No rash. Unclear if there is prior history of kidney stones, or not. Patient is taking tamsulosin, and has for some time. He has some difficulty starting his urinary stream at times, with urgency, and this has been worse in the last week. He has not had incontinence, but worries about urge incontinence, because sometimes he feels like he just made it to the bathroom in time. Area.  He is in pain management for diabetic neuropathy.  Review of [facility] reveals an abdominal/pelvic CT scan on December 12 of this year, which demonstrated several left-sided kidney stones, without obstruction or hydronephrosis.    Past Medical History  Diagnosis Date  . Arthritis   . GERD (gastroesophageal reflux disease)   . Hypertension   . Diabetes mellitus without complication ([place])   . Cancer ([place]) 2012    R kidney removed  . 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   Past Surgical History  Procedure Laterality Date  . Spine surgery      lumbar 2013  . Kidney removed Right     2012  . Back surgery    . Nephrectomy Right 04/2011   Family History  Problem Relation Age of Onset  . Diabetes Mother   . Hypertension Mother   . Asthma Mother   . Heart disease Father   . Kidney disease Father   . Diabetes Father   . Stroke Father    Social History  Substance Use Topics  . Smoking status: Never Smoker   . Smokeless tobacco: Never  Used  . Alcohol Use: No    Review of Systems  All other systems reviewed and are negative.   Allergies  Floxin and Hydromorphone  Home Medications   Prior to Admission medications   Medication Sig Start Date End Date Taking? Authorizing Provider  albuterol (PROVENTIL HFA;VENTOLIN HFA) 108 (90 BASE) MCG/ACT inhaler Inhale into the lungs every 6 (six) hours as needed for wheezing or shortness of breath.    Historical Provider, MD  canagliflozin (INVOKANA) 100 MG TABS tablet Take 100 mg by mouth.    Historical Provider, MD  citalopram (CELEXA) 20 MG tablet Take 20 mg by mouth daily.    Historical Provider, MD  clonazePAM (KLONOPIN) 0.5 MG tablet Take 0.5 mg by mouth 2 (two) times daily as needed for anxiety.    Historical Provider, MD  dicyclomine (BENTYL) 10 MG capsule Take 10 mg by mouth 4 (four) times daily -  before meals and at bedtime.    Historical Provider, MD  loratadine (CLARITIN) 10 MG tablet Take 10 mg by mouth daily.    Historical Provider, MD  Multiple Vitamin (MULTIVITAMIN) capsule Take 1 capsule by mouth daily.    Historical Provider, MD  omeprazole (PRILOSEC) 40 MG capsule Take 40 mg by mouth daily.    Historical Provider, MD  oxyCODONE-acetaminophen (PERCOCET) 10-325 MG per  tablet Take 1 tablet by mouth every 4 (four) hours as needed for pain.    Historical Provider, MD  pregabalin (LYRICA) 150 MG capsule Take 150 mg by mouth 2 (two) times daily.    Historical Provider, MD  pregabalin (LYRICA) 50 MG capsule Take 50 mg by mouth 3 (three) times daily.    Historical Provider, MD  sucralfate (CARAFATE) 1 G tablet Take 1 g by mouth 4 (four) times daily -  with meals and at bedtime.    Historical Provider, MD  tamsulosin (FLOMAX) 0.4 MG CAPS capsule Take 0.4 mg by mouth.    Historical Provider, MD  terbinafine (LAMISIL) 250 MG tablet Take 250 mg by mouth daily.    Historical Provider, MD          Meds Ordered and Administered this Visit   Medications  ketorolac (TORADOL)  injection 60 mg (60 mg Intramuscular Given 04/30/15 1005)    BP 139/80 mmHg  Pulse 62  Temp(Src) 97.8 F (36.6 C) (Tympanic)  Ht 6\' 2"  (1.88 m)  Wt 275 lb (124.739 kg)  BMI 35.29 kg/m2  SpO2 99%   Physical Exam  Constitutional: He is oriented to person, place, and time. No distress.  Alert, nicely groomed Patient is observed walking to the restroom, and he is leaning slightly to the right. Pain is in the left flank.  HENT:  Head: Atraumatic.  Eyes:  Conjugate gaze, no eye redness/drainage  Neck: Neck supple.  Cardiovascular: Normal rate and regular rhythm.   Pulmonary/Chest: No respiratory distress. He has no wheezes. He has no rales.  Lungs clear, symmetric breath sounds  Abdominal: Soft. He exhibits no distension. There is no rebound.  Somewhat protuberant, able to palpate deeply, diffuse mild tenderness with deep palpation. Nonfocal. Palpation of the left flank does not increase discomfort. There is no rash. No CVAT.  Musculoskeletal: Normal range of motion. He exhibits no edema.  Neurological: He is alert and oriented to person, place, and time.  Skin: Skin is warm and dry. No rash noted.  No cyanosis  Nursing note and vitals reviewed.   ED Course  Procedures (including critical care time)  Labs Review  Results for orders placed or performed during the hospital encounter of 04/30/15  Urine culture  Result Value Ref Range   Specimen Description URINE, CLEAN CATCH    Special Requests NONE    Culture NO GROWTH 2 DAYS    Report Status 05/03/2015 FINAL   Urinalysis complete, with microscopic  Result Value Ref Range   Color, Urine YELLOW YELLOW   APPearance CLEAR CLEAR   Glucose, UA 250 (A) NEGATIVE mg/dL   Bilirubin Urine NEGATIVE NEGATIVE   Ketones, ur NEGATIVE NEGATIVE mg/dL   Specific Gravity, Urine 1.020 1.005 - 1.030   Hgb urine dipstick TRACE (A) NEGATIVE   pH 6.5 5.0 - 8.0   Protein, ur NEGATIVE NEGATIVE mg/dL   Nitrite NEGATIVE NEGATIVE   Leukocytes,  UA NEGATIVE NEGATIVE   RBC / HPF 0-5 0 - 5 RBC/hpf   WBC, UA 0-5 0 - 5 WBC/hpf   Bacteria, UA NONE SEEN NONE SEEN   Squamous Epithelial / LPF NONE SEEN NONE SEEN   Urine culture pending  MDM   1. Renal colic on left side   2. Left nephrolithiasis    Prescription for a small quantity of tramadol (#12) 100mg  was printed today. CT scan abdomen/pelvis on 04/25/15 showed small left sided kidney stones, not blocking flow from the left kidney. Push fluids. Followup PCP/Dr  Olmedo and pain management doctor for longer term pain medicine needs for flank pain.    Sherlene Shams, MD 05/04/15 1245

## 2015-05-03 LAB — URINE CULTURE: Culture: NO GROWTH

## 2015-05-05 ENCOUNTER — Ambulatory Visit (INDEPENDENT_AMBULATORY_CARE_PROVIDER_SITE_OTHER): Payer: 59 | Admitting: Urology

## 2015-05-05 ENCOUNTER — Encounter: Payer: Self-pay | Admitting: Urology

## 2015-05-05 VITALS — BP 109/65 | HR 67 | Ht 74.0 in | Wt 264.1 lb

## 2015-05-05 DIAGNOSIS — Z85528 Personal history of other malignant neoplasm of kidney: Secondary | ICD-10-CM | POA: Diagnosis not present

## 2015-05-05 DIAGNOSIS — N200 Calculus of kidney: Secondary | ICD-10-CM | POA: Diagnosis not present

## 2015-05-05 LAB — URINALYSIS, COMPLETE
Bilirubin, UA: NEGATIVE
Ketones, UA: NEGATIVE
Leukocytes, UA: NEGATIVE
Nitrite, UA: NEGATIVE
Protein, UA: NEGATIVE
RBC, UA: NEGATIVE
Specific Gravity, UA: 1.015 (ref 1.005–1.030)
Urobilinogen, Ur: 0.2 mg/dL (ref 0.2–1.0)
pH, UA: 7 (ref 5.0–7.5)

## 2015-05-05 LAB — MICROSCOPIC EXAMINATION
Bacteria, UA: NONE SEEN
Epithelial Cells (non renal): NONE SEEN /hpf (ref 0–10)

## 2015-05-05 NOTE — Progress Notes (Signed)
05/05/2015 2:50 PM   Anthony Fuller 1960/07/10 [id number]  Referring provider: Valera Castle, MD [address] [place], [place] [address]  Chief Complaint  Patient presents with  . New Patient (Initial Visit)    kidney stones     HPI: The patient is a 54 year old gentleman with a past history significant for right nephrectomy who presents with a left lower pole nonobstructing 8 mm stone. He is asymptomatic from the stones at this time. He does have lower back pain but not localized left flank.  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 He had negative margins and no lymphovascular invasion.  PMH: Past Medical History  Diagnosis Date  . Arthritis   . GERD (gastroesophageal reflux disease)   . Hypertension   . Diabetes mellitus without complication ([place])   . Cancer ([place]) 2012    R kidney removed  . 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    Surgical History: Past Surgical History  Procedure Laterality Date  . Spine surgery      lumbar 2013  . Kidney removed Right     2012  . Back surgery    . Nephrectomy Right 04/2011    Home Medications:    Medication List       This list is accurate as of: 05/05/15  2:50 PM.  Always use your most recent med list.               albuterol 108 (90 BASE) MCG/ACT inhaler  Commonly known as:  PROVENTIL HFA;VENTOLIN HFA  Inhale into the lungs every 6 (six) hours as needed for wheezing or shortness of breath.     canagliflozin 100 MG Tabs tablet  Commonly known as:  INVOKANA  Take 100 mg by mouth.     clonazePAM 0.5 MG tablet  Commonly known as:  KLONOPIN  Take 0.5 mg by mouth 2 (two) times daily as needed for anxiety.     dicyclomine 10 MG capsule  Commonly known as:  BENTYL  Take 10 mg by mouth 4 (four) times daily -  before meals and at bedtime.     HYDROcodone-acetaminophen 5-325 MG tablet  Commonly known as:  NORCO/VICODIN  TAKE 1-2 TABLETS BY MOPUTH  EVERY 4 HOURS AS NEEDED FOR PAIN     hydrocortisone 25 MG suppository  Commonly known as:  ANUSOL-HC  Place rectally.     JARDIANCE 10 MG Tabs tablet  Generic drug:  empagliflozin  Take by mouth.     loratadine 10 MG tablet  Commonly known as:  CLARITIN  Take 10 mg by mouth daily.     meloxicam 15 MG tablet  Commonly known as:  MOBIC  TAKE 1 TABLET (15 MG TOTAL) BY MOUTH ONCE DAILY.     metFORMIN 500 MG (MOD) 24 hr tablet  Commonly known as:  GLUMETZA  Take by mouth.     multivitamin capsule  Take 1 capsule by mouth daily.     nortriptyline 50 MG capsule  Commonly known as:  PAMELOR  Take by mouth.     omeprazole 40 MG capsule  Commonly known as:  PRILOSEC  Take 40 mg by mouth daily.     ONETOUCH VERIO test strip  Generic drug:  glucose blood  2 (two) times daily. for testing     oxyCODONE-acetaminophen 10-325 MG tablet  Commonly known as:  PERCOCET  Take 1 tablet by mouth every 4 (four) hours as needed  for pain.     pregabalin 150 MG capsule  Commonly known as:  LYRICA  Take 150 mg by mouth 2 (two) times daily.     pregabalin 50 MG capsule  Commonly known as:  LYRICA  Take 50 mg by mouth 3 (three) times daily.     sucralfate 1 G tablet  Commonly known as:  CARAFATE  Take 1 g by mouth 4 (four) times daily -  with meals and at bedtime.     tamsulosin 0.4 MG Caps capsule  Commonly known as:  FLOMAX  Take 0.4 mg by mouth.     terbinafine 250 MG tablet  Commonly known as:  LAMISIL  Take 250 mg by mouth daily.     tiZANidine 4 MG capsule  Commonly known as:  ZANAFLEX  TAKE 1-2 CAPS THREE (3) TIMES DAILY     traMADol 50 MG tablet  Commonly known as:  ULTRAM  Take 50 mg by mouth every 6 (six) hours as needed. for pain        Allergies:  Allergies  Allergen Reactions  . Floxin [Ofloxacin]   . Hydromorphone     Family History: Family History  Problem Relation Age of Onset  . Diabetes Mother   . Hypertension Mother   . Asthma Mother   . Heart  disease Father   . Kidney disease Father   . Diabetes Father   . Stroke Father     Social History:  reports that he has never smoked. He has never used smokeless tobacco. He reports that he does not drink alcohol or use illicit drugs.  ROS: UROLOGY Frequent Urination?: No Hard to postpone urination?: Yes Burning/pain with urination?: No Get up at night to urinate?: No Leakage of urine?: No Urine stream starts and stops?: Yes Trouble starting stream?: No Do you have to strain to urinate?: Yes Blood in urine?: No Urinary tract infection?: No Sexually transmitted disease?: No Injury to kidneys or bladder?: Yes Painful intercourse?: No Weak stream?: Yes Erection problems?: No Penile pain?: No  Gastrointestinal Nausea?: Yes Vomiting?: No Indigestion/heartburn?: No Diarrhea?: Yes Constipation?: No  Constitutional Fever: No Night sweats?: No Weight loss?: No Fatigue?: No  Skin Skin rash/lesions?: No Itching?: No  Eyes Blurred vision?: No Double vision?: No  Ears/Nose/Throat Sore throat?: No Sinus problems?: No  Hematologic/Lymphatic Swollen glands?: No Easy bruising?: No  Cardiovascular Leg swelling?: No Chest pain?: No  Respiratory Cough?: No Shortness of breath?: No  Endocrine Excessive thirst?: No  Musculoskeletal Back pain?: Yes Joint pain?: No  Neurological Headaches?: No Dizziness?: Yes  Psychologic Depression?: No Anxiety?: Yes  Physical Exam: BP 109/65 mmHg  Pulse 67  Ht 6\' 2"  (1.88 m)  Wt 264 lb 1.6 oz (119.795 kg)  BMI 33.89 kg/m2  Constitutional:  Alert and oriented, No acute distress. HEENT: [place] AT, moist mucus membranes.  Trachea midline, no masses. Cardiovascular: No clubbing, cyanosis, or edema. Respiratory: Normal respiratory effort, no increased work of breathing. GI: Abdomen is soft, nontender, nondistended, no abdominal masses GU: No CVA tenderness. Normal phallus. Testicles and equal bilaterally. Left varicocele  noted. No masses. Skin: No rashes, bruises or suspicious lesions. Lymph: No cervical or inguinal adenopathy. Neurologic: Grossly intact, no focal deficits, moving all 4 extremities. Psychiatric: Normal mood and affect.  Laboratory Data: Lab Results  Component Value Date   WBC 7.3 06/01/2014   HGB 14.8 06/03/2014   HCT 44.0 06/03/2014   MCV 89 06/01/2014   PLT 162 06/01/2014    Lab Results  Component Value Date   CREATININE 1.19 06/03/2014    No results found for: PSA  No results found for: TESTOSTERONE  No results found for: HGBA1C  Urinalysis    Component Value Date/Time   COLORURINE YELLOW 04/30/2015 0935   COLORURINE Yellow 05/31/2014 0637   APPEARANCEUR CLEAR 04/30/2015 0935   APPEARANCEUR Turbid 05/31/2014 0637   LABSPEC 1.020 04/30/2015 0935   LABSPEC 1.029 05/31/2014 0637   PHURINE 6.5 04/30/2015 0935   PHURINE 5.0 05/31/2014 0637   GLUCOSEU 250* 04/30/2015 0935   GLUCOSEU 50 mg/dL 05/31/2014 0637   HGBUR TRACE* 04/30/2015 0935   HGBUR Negative 05/31/2014 [place] 04/30/2015 0935   BILIRUBINUR Negative 05/31/2014 [place] 04/30/2015 0935   KETONESUR Negative 05/31/2014 0637   PROTEINUR NEGATIVE 04/30/2015 0935   PROTEINUR 30 mg/dL 05/31/2014 0637   NITRITE NEGATIVE 04/30/2015 0935   NITRITE Negative 05/31/2014 0637   LEUKOCYTESUR NEGATIVE 04/30/2015 0935   LEUKOCYTESUR Negative 05/31/2014 0637    Pertinent Imaging:  CLINICAL DATA: Worsening lower abdominal and pelvic pain for 6 months. Abdominal distention. Cirrhosis. Previous right nephrectomy for renal cell carcinoma.  EXAM: CT ABDOMEN AND PELVIS WITHOUT AND WITH CONTRAST  TECHNIQUE: Multidetector CT imaging of the abdomen and pelvis was performed following the standard protocol before and following the bolus administration of intravenous contrast.  CONTRAST: 138mL OMNIPAQUE IOHEXOL 350 MG/ML SOLN  COMPARISON: 05/31/2014 and  11/13/2013  FINDINGS: Lower chest: No acute findings.  Hepatobiliary: No masses or other significant abnormality. Gallbladder is unremarkable.  Pancreas: No mass, inflammatory changes, or other significant abnormality.  Spleen: Within normal limits in size and appearance.  Adrenals/Urinary Tract: Adrenal glands are unremarkable. Prior right nephrectomy. Several left renal calculi are seen, largest in lower pole measuring 8 mm. No evidence of hydronephrosis. No evidence of left renal mass. unopacified urinary bladder is unremarkable in appearance.  Stomach/Bowel: No evidence of obstruction, inflammatory process, or abnormal fluid collections. Normal appendix visualized.  Vascular/Lymphatic: No pathologically enlarged lymph nodes. No evidence of abdominal aortic aneurysm.  Reproductive: No mass or other significant abnormality.  Other: Small right inguinal hernia is again seen containing only fat. No evidence herniated bowel loops.  Musculoskeletal: No suspicious bone lesions identified.  IMPRESSION: No acute findings within the abdomen or pelvis.  Nonobstructive left nephrolithiasis. Previous right nephrectomy. No evidence of recurrent or metastatic carcinoma.  No evidence of hepatic mass, splenomegaly, or ascites.  Stable small right inguinal hernia containing only fat.     Assessment & Plan:    I discussed the treatment options for renal calculi with the patient. We discussed lithotripsy. I do not think he is a good candidate for this as he has a solitary kidney as well as a lower pole stone. He risk going into renal failure if he develops an obstructing stone in solitary kidney. We discussed ureteroscopy in great detail. I feel that would be the best option for him given that he has a solitary kidney in the lower pole stone. We discussed the risks, benefits, and indications of ureteroscopy. We also discussed the need for multiple procedures as a risk  factor. The patient is agreeable wishes to proceed.  1. Left nonobstructing 8 mm lower pole calculus and a solitary kidney Cystoscopy, left ureteroscopy, laser lithotripsy, left retropyelogram, left ureteral stent  2. History of right renal cell carcinoma No evidence of disease  Return for surgery.  Nickie Retort, MD  [facility] [address], [place], [place]  [address] [phone]   

## 2015-05-06 ENCOUNTER — Telehealth: Payer: Self-pay | Admitting: Radiology

## 2015-05-06 ENCOUNTER — Ambulatory Visit: Payer: Self-pay

## 2015-05-06 ENCOUNTER — Telehealth: Payer: Self-pay | Admitting: *Deleted

## 2015-05-06 ENCOUNTER — Ambulatory Visit
Admission: EM | Admit: 2015-05-06 | Discharge: 2015-05-06 | Disposition: A | Discharge status: Home / Self Care | Payer: 59 | Attending: Family Medicine | Admitting: Family Medicine

## 2015-05-06 DIAGNOSIS — M5431 Sciatica, right side: Secondary | ICD-10-CM

## 2015-05-06 MED ORDER — METHYLPREDNISOLONE 4 MG PO TBPK: ORAL_TABLET | ORAL | Status: DC

## 2015-05-06 NOTE — Discharge Instructions (Signed)
°  Sciatica °Sciatica is pain, weakness, numbness, or tingling along your sciatic nerve. The nerve starts in the lower back and runs down the back of each leg. Nerve damage or certain conditions pinch or put pressure on the sciatic nerve. This causes the pain, weakness, and other discomforts of sciatica. °HOME CARE  °· Only take medicine as told by your doctor. °· Apply ice to the affected area for 20 minutes. Do this 3-4 times a day for the first 48-72 hours. Then try heat in the same way. °· Exercise, stretch, or do your usual activities if these do not make your pain worse. °· Go to physical therapy as told by your doctor. °· Keep all doctor visits as told. °· Do not wear high heels or shoes that are not supportive. °· Get a firm mattress if your mattress is too soft to lessen pain and discomfort. °GET HELP RIGHT AWAY IF:  °· You cannot control when you poop (bowel movement) or pee (urinate). °· You have more weakness in your lower back, lower belly (pelvis), butt (buttocks), or legs. °· You have redness or puffiness (swelling) of your back. °· You have a burning feeling when you pee. °· You have pain that gets worse when you lie down. °· You have pain that wakes you from your sleep. °· Your pain is worse than past pain. °· Your pain lasts longer than 4 weeks. °· You are suddenly losing weight without reason. °MAKE SURE YOU:  °· Understand these instructions. °· Will watch this condition. °· Will get help right away if you are not doing well or get worse. °  °This information is not intended to replace advice given to you by your health care provider. Make sure you discuss any questions you have with your health care provider. °  °Document Released: 02/07/2008 Document Revised: 01/19/2015 Document Reviewed: 09/09/2011 °Elsevier Interactive Patient Education ©2016 [employer] ° ° °

## 2015-05-06 NOTE — ED Provider Notes (Signed)
CSN: [id number]     Arrival date & time 05/06/15  [id number] History   First MD Initiated Contact with Patient 05/06/15 [phone]     Chief Complaint  Patient presents with  . Hip Pain   (Consider location/radiation/quality/duration/timing/severity/associated sxs/prior Treatment) HPI   54 year old male who presents with right buttock pain where he indicates the sacroiliac area for the last 3-4 days. He states that sitting is the worst activity. He denies any radicular symptoms at this time. He denies any incontinence. States that he saw his kidney doctor yesterday that told him it may be sciatica. 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 Fact when questioned further he does not remember anything that may have caused his pain at this time.  Past Medical History  Diagnosis Date  . Arthritis   . GERD (gastroesophageal reflux disease)   . Hypertension   . Diabetes mellitus without complication ([place])   . Cancer ([place]) 2012    R kidney removed  . 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   Past Surgical History  Procedure Laterality Date  . Spine surgery      lumbar 2013  . Kidney removed Right     2012  . Back surgery    . Nephrectomy Right 04/2011   Family History  Problem Relation Age of Onset  . Diabetes Mother   . Hypertension Mother   . Asthma Mother   . Heart disease Father   . Kidney disease Father   . Diabetes Father   . Stroke Father    Social History  Substance Use Topics  . Smoking status: Never Smoker   . Smokeless tobacco: Never Used  . Alcohol Use: No    Review of Systems  Allergies  Floxin and Hydromorphone  Home Medications   Prior to Admission medications   Medication Sig Start Date End Date Taking? Authorizing Provider  dicyclomine (BENTYL) 10 MG capsule Take 10 mg by mouth 4 (four) times daily -  before meals and at bedtime.   Yes Historical Provider, MD   empagliflozin (JARDIANCE) 10 MG TABS tablet Take by mouth. 04/22/15  Yes Historical Provider, MD  metFORMIN (GLUMETZA) 500 MG (MOD) 24 hr tablet Take by mouth. 11/26/14  Yes Historical Provider, MD  Multiple Vitamin (MULTIVITAMIN) capsule Take 1 capsule by mouth daily.   Yes Historical Provider, MD  nortriptyline (PAMELOR) 50 MG capsule Take by mouth. 03/11/15 03/10/16 Yes Historical Provider, MD  omeprazole (PRILOSEC) 40 MG capsule Take 40 mg by mouth daily.   Yes Historical Provider, MD  [facility] [address] VERIO test strip 2 (two) times daily. for testing 02/10/15  Yes Historical Provider, MD  pregabalin (LYRICA) 150 MG capsule Take 150 mg by mouth 2 (two) times daily.   Yes Historical Provider, MD  pregabalin (LYRICA) 50 MG capsule Take 50 mg by mouth 3 (three) times daily.   Yes Historical Provider, MD  terbinafine (LAMISIL) 250 MG tablet Take 250 mg by mouth daily.   Yes Historical Provider, MD  tiZANidine (ZANAFLEX) 4 MG capsule TAKE 1-2 CAPS THREE (3) TIMES DAILY 04/16/15  Yes Historical Provider, MD  traMADol (ULTRAM) 50 MG tablet Take 50 mg by mouth every 6 (six) hours as needed. for pain 04/07/15  Yes Historical Provider, MD  albuterol (PROVENTIL HFA;VENTOLIN HFA) 108 (90 BASE) MCG/ACT inhaler Inhale into the lungs every 6 (six) hours as needed for wheezing or shortness  of breath.    Historical Provider, MD  canagliflozin (INVOKANA) 100 MG TABS tablet Take 100 mg by mouth.    Historical Provider, MD  clonazePAM (KLONOPIN) 0.5 MG tablet Take 0.5 mg by mouth 2 (two) times daily as needed for anxiety.    Historical Provider, MD  HYDROcodone-acetaminophen (NORCO/VICODIN) 5-325 MG tablet TAKE 1-2 TABLETS BY MOPUTH EVERY 4 HOURS AS NEEDED FOR PAIN 04/26/15   Historical Provider, MD  hydrocortisone (ANUSOL-HC) 25 MG suppository Place rectally.    Historical Provider, MD  loratadine (CLARITIN) 10 MG tablet Take 10 mg by mouth daily.    Historical Provider, MD  meloxicam (MOBIC) 15 MG tablet TAKE 1 TABLET (15  MG TOTAL) BY MOUTH ONCE DAILY. 03/11/15   Historical Provider, MD  methylPREDNISolone (MEDROL DOSEPAK) 4 MG TBPK tablet Take per package instructions 05/06/15   Lorin Picket, PA-C  oxyCODONE-acetaminophen (PERCOCET) 10-325 MG per tablet Take 1 tablet by mouth every 4 (four) hours as needed for pain.    Historical Provider, MD  sucralfate (CARAFATE) 1 G tablet Take 1 g by mouth 4 (four) times daily -  with meals and at bedtime.    Historical Provider, MD  tamsulosin (FLOMAX) 0.4 MG CAPS capsule Take 0.4 mg by mouth.    Historical Provider, MD   Meds Ordered and Administered this Visit  Medications - No data to display  Pulse 62  Temp(Src) 96.3 F (35.7 C) (Tympanic)  Resp 16  Ht 6\' 2"  (1.88 m)  Wt 264 lb (119.75 kg)  BMI 33.88 kg/m2  SpO2 100% No data found.   Physical Exam  ED Course  Procedures (including critical care time)  Labs Review Labs Reviewed - No data to display  Imaging Review No results found.   Visual Acuity Review  Right Eye Distance:   Left Eye Distance:   Bilateral Distance:    Right Eye Near:   Left Eye Near:    Bilateral Near:         MDM   1. Right sided sciatica    Discharge Medication List as of 05/06/2015 10:29 AM    START taking these medications   Details  methylPREDNISolone (MEDROL DOSEPAK) 4 MG TBPK tablet Take per package instructions, Normal      Plan: 1. Diagnosis reviewed with patient 2. rx as per orders; risks, benefits, potential side effects reviewed with patient 3. Recommend supportive treatment with symptom avoidance . Specifically no sitting lifting or bending. Patient has a solitary kidney and has been discouraged from using nonsteroidal anti-inflammatory medications. Therefore I will only provide him with a Medrol Dosepak and given him explicit instructions to avoid symptoms at all cost. I've also asked him to make a follow-up appointment with his primary care physician next week so that he can be followed closely.  He already has had a lumbar laminectomy in the past she thinks in 2012. At this time he has no incontinence muscular weakness but does have  numbness in his lower extremities but this is from a chronic peripheral neuropathy from his diabetes. 4. F/u with his PCP next week    Lorin Picket, PA-C 05/06/15 1036

## 2015-05-06 NOTE — Telephone Encounter (Signed)
Pt's wife notified of surgery scheduled 05/18/15, pre-admit testing appt on 05/11/15 @10 :15 and to call day prior to surgery for arrival time to SDS. Wife voices understanding.

## 2015-05-06 NOTE — Telephone Encounter (Signed)
Lab results. Please advise

## 2015-05-06 NOTE — ED Notes (Signed)
States has had right buttock pain x 3-4 days, pain worse with sitting. Saw kidney doctor yesterday and told "Might be sciatica"

## 2015-05-06 NOTE — Telephone Encounter (Signed)
Labs normal.

## 2015-05-11 ENCOUNTER — Encounter
Admit: 2015-05-11 | Discharge: 2015-05-11 | Disposition: A | Discharge status: Home / Self Care | Payer: 59 | Attending: Urology | Admitting: Urology

## 2015-05-11 DIAGNOSIS — Z01812 Encounter for preprocedural laboratory examination: Secondary | ICD-10-CM | POA: Insufficient documentation

## 2015-05-11 DIAGNOSIS — Z01810 Encounter for preprocedural cardiovascular examination: Secondary | ICD-10-CM | POA: Insufficient documentation

## 2015-05-11 HISTORY — DX: Anxiety disorder, unspecified: F41.9

## 2015-05-11 LAB — PROTIME-INR
INR: 0.97
Prothrombin Time: 13.1 seconds (ref 11.4–15.0)

## 2015-05-11 LAB — APTT: aPTT: 26 seconds (ref 24–36)

## 2015-05-11 NOTE — Patient Instructions (Signed)
  Your procedure is scheduled on: Wednesday 05/18/2015 Report to Day Surgery. 2ND FLOOR MEDICAL MALL [facility] To find out your arrival time please call [phone] between 1PM - 3PM on Tuesday 05/17/2015.  Remember: Instructions that are not followed completely may result in serious medical risk, up to and including death, or upon the discretion of your surgeon and anesthesiologist your surgery may need to be rescheduled.    __X__ 1. Do not eat food or drink liquids after midnight. No gum chewing or hard candies.     __X__ 2. No Alcohol for 24 hours before or after surgery.   ____ 3. Bring all medications with you on the day of surgery if instructed.    __X__ 4. Notify your doctor if there is any change in your medical condition     (cold, fever, infections).     Do not wear jewelry, make-up, hairpins, clips or nail polish.  Do not wear lotions, powders, or perfumes. You may wear deodorant.  Do not shave 48 hours prior to surgery. Men may shave face and neck.  Do not bring valuables to the hospital.    [facility] is not responsible for any belongings or valuables.               Contacts, dentures or bridgework may not be worn into surgery.  Leave your suitcase in the car. After surgery it may be brought to your room.  For patients admitted to the hospital, discharge time is determined by your                treatment team.   Patients discharged the day of surgery will not be allowed to drive home.   Please read over the following fact sheets that you were given:   Surgical Site Infection Prevention   __X__ Take these medicines the morning of surgery with A SIP OF WATER:    1. OMEPRAZOLE  2. LYRICA  3. CLONAZEPAM IF NEEDED FOR ANXIETY  4. HYDROCODONE IF NEEDED FOR PAIN  5.  6.  ____ Fleet Enema (as directed)   ____ Use CHG Soap as directed  ____ Use inhalers on the day of surgery  __X__ Stop metformin 2 days prior to surgery    ____ Take 1/2 of usual insulin dose the  night before surgery and none on the morning of surgery.   ____ Stop Coumadin/Plavix/aspirin on   __X__ Stop Anti-inflammatories on TODAY (MELOXICAM)   ____ Stop supplements until after surgery.    ____ Bring C-Pap to the hospital.

## 2015-05-12 LAB — URINE CULTURE

## 2015-05-18 ENCOUNTER — Ambulatory Visit: Payer: 59 | Admitting: Anesthesiology

## 2015-05-18 ENCOUNTER — Encounter: Payer: Self-pay | Admitting: *Deleted

## 2015-05-18 ENCOUNTER — Ambulatory Visit
Admission: RE | Admit: 2015-05-18 | Discharge: 2015-05-18 | Disposition: A | Discharge status: Home / Self Care | Payer: 59 | Source: Ambulatory Visit | Attending: Urology | Admitting: Urology

## 2015-05-18 ENCOUNTER — Encounter: Payer: Self-pay | Admitting: Urology

## 2015-05-18 ENCOUNTER — Encounter
Admission: RE | Disposition: A | Discharge status: Home / Self Care | Payer: Self-pay | Source: Ambulatory Visit | Attending: Urology

## 2015-05-18 ENCOUNTER — Telehealth: Payer: Self-pay

## 2015-05-18 DIAGNOSIS — E119 Type 2 diabetes mellitus without complications: Secondary | ICD-10-CM | POA: Insufficient documentation

## 2015-05-18 DIAGNOSIS — I10 Essential (primary) hypertension: Secondary | ICD-10-CM | POA: Diagnosis not present

## 2015-05-18 DIAGNOSIS — Z85528 Personal history of other malignant neoplasm of kidney: Secondary | ICD-10-CM | POA: Diagnosis not present

## 2015-05-18 DIAGNOSIS — Z905 Acquired absence of kidney: Secondary | ICD-10-CM | POA: Insufficient documentation

## 2015-05-18 DIAGNOSIS — Z79899 Other long term (current) drug therapy: Secondary | ICD-10-CM | POA: Diagnosis not present

## 2015-05-18 DIAGNOSIS — K219 Gastro-esophageal reflux disease without esophagitis: Secondary | ICD-10-CM | POA: Insufficient documentation

## 2015-05-18 DIAGNOSIS — R140 Abdominal distension (gaseous): Secondary | ICD-10-CM | POA: Diagnosis not present

## 2015-05-18 DIAGNOSIS — K7460 Unspecified cirrhosis of liver: Secondary | ICD-10-CM | POA: Diagnosis not present

## 2015-05-18 DIAGNOSIS — M545 Low back pain: Secondary | ICD-10-CM | POA: Insufficient documentation

## 2015-05-18 DIAGNOSIS — M1990 Unspecified osteoarthritis, unspecified site: Secondary | ICD-10-CM | POA: Diagnosis not present

## 2015-05-18 DIAGNOSIS — Z6833 Body mass index (BMI) 33.0-33.9, adult: Secondary | ICD-10-CM | POA: Diagnosis not present

## 2015-05-18 DIAGNOSIS — K4090 Unilateral inguinal hernia, without obstruction or gangrene, not specified as recurrent: Secondary | ICD-10-CM | POA: Insufficient documentation

## 2015-05-18 DIAGNOSIS — N289 Disorder of kidney and ureter, unspecified: Secondary | ICD-10-CM | POA: Insufficient documentation

## 2015-05-18 DIAGNOSIS — N200 Calculus of kidney: Secondary | ICD-10-CM | POA: Diagnosis not present

## 2015-05-18 DIAGNOSIS — E669 Obesity, unspecified: Secondary | ICD-10-CM | POA: Diagnosis not present

## 2015-05-18 DIAGNOSIS — Z7951 Long term (current) use of inhaled steroids: Secondary | ICD-10-CM | POA: Diagnosis not present

## 2015-05-18 DIAGNOSIS — Z7984 Long term (current) use of oral hypoglycemic drugs: Secondary | ICD-10-CM | POA: Diagnosis not present

## 2015-05-18 HISTORY — PX: CYSTOSCOPY W/ RETROGRADES: SHX1426

## 2015-05-18 HISTORY — PX: CYSTOSCOPY WITH STENT PLACEMENT: SHX5790

## 2015-05-18 HISTORY — PX: URETEROSCOPY WITH HOLMIUM LASER LITHOTRIPSY: SHX6645

## 2015-05-18 LAB — GLUCOSE, CAPILLARY
Glucose-Capillary: 158 mg/dL — ABNORMAL HIGH (ref 65–99)
Glucose-Capillary: 133 mg/dL — ABNORMAL HIGH (ref 65–99)

## 2015-05-18 SURGERY — URETEROSCOPY, WITH LITHOTRIPSY USING HOLMIUM LASER
Anesthesia: General | Laterality: Left | Wound class: Clean Contaminated

## 2015-05-18 MED ORDER — BELLADONNA ALKALOIDS-OPIUM 16.2-60 MG RE SUPP
RECTAL | Status: AC
  Administered 2015-05-18: 1 via RECTAL
  Filled 2015-05-18: qty 1

## 2015-05-18 MED ORDER — CEPHALEXIN 500 MG PO CAPS: 500.0000 mg | ORAL_CAPSULE | Freq: Three times a day (TID) | ORAL | Status: DC

## 2015-05-18 MED ORDER — IOTHALAMATE MEGLUMINE 43 % IV SOLN
INTRAVENOUS | Status: DC | PRN
  Administered 2015-05-18: 40 mL

## 2015-05-18 MED ORDER — SODIUM CHLORIDE 0.9 % IV SOLN
INTRAVENOUS | Status: DC
  Administered 2015-05-18 (×2): via INTRAVENOUS

## 2015-05-18 MED ORDER — DEXAMETHASONE SODIUM PHOSPHATE 10 MG/ML IJ SOLN
INTRAMUSCULAR | Status: DC | PRN
  Administered 2015-05-18: 10 mg via INTRAVENOUS

## 2015-05-18 MED ORDER — FENTANYL CITRATE (PF) 100 MCG/2ML IJ SOLN
INTRAMUSCULAR | Status: AC
  Administered 2015-05-18: 25 ug via INTRAVENOUS
  Filled 2015-05-18: qty 2

## 2015-05-18 MED ORDER — HYDROCODONE-ACETAMINOPHEN 5-325 MG PO TABS
1.0000 | ORAL_TABLET | Freq: Once | ORAL | Status: AC
  Administered 2015-05-18: 1 via ORAL

## 2015-05-18 MED ORDER — SUCCINYLCHOLINE CHLORIDE 20 MG/ML IJ SOLN
INTRAMUSCULAR | Status: DC | PRN
  Administered 2015-05-18: 100 mg via INTRAVENOUS

## 2015-05-18 MED ORDER — FENTANYL CITRATE (PF) 100 MCG/2ML IJ SOLN
INTRAMUSCULAR | Status: DC | PRN
  Administered 2015-05-18: 100 ug via INTRAVENOUS

## 2015-05-18 MED ORDER — HYDROCODONE-ACETAMINOPHEN 5-325 MG PO TABS
ORAL_TABLET | ORAL | Status: AC
  Filled 2015-05-18: qty 1

## 2015-05-18 MED ORDER — ONDANSETRON HCL 4 MG/2ML IJ SOLN: 4.0000 mg | Freq: Once | INTRAMUSCULAR | Status: DC | PRN

## 2015-05-18 MED ORDER — BELLADONNA ALKALOIDS-OPIUM 16.2-60 MG RE SUPP
1.0000 | Freq: Every day | RECTAL | Status: DC
  Administered 2015-05-18: 1 via RECTAL

## 2015-05-18 MED ORDER — PROPOFOL 10 MG/ML IV BOLUS
INTRAVENOUS | Status: DC | PRN
  Administered 2015-05-18: 300 mg via INTRAVENOUS

## 2015-05-18 MED ORDER — HYDROCODONE-ACETAMINOPHEN 5-325 MG PO TABS: ORAL_TABLET | ORAL | Status: DC

## 2015-05-18 MED ORDER — CEFAZOLIN SODIUM-DEXTROSE 2-3 GM-% IV SOLR
INTRAVENOUS | Status: AC
Start: 2015-05-18 — End: 2015-05-18
  Administered 2015-05-18: 2 g via INTRAVENOUS
  Filled 2015-05-18: qty 50

## 2015-05-18 MED ORDER — CEFAZOLIN SODIUM-DEXTROSE 2-3 GM-% IV SOLR
2.0000 g | Freq: Once | INTRAVENOUS | Status: AC
  Administered 2015-05-18: 2 g via INTRAVENOUS

## 2015-05-18 MED ORDER — MIDAZOLAM HCL 2 MG/2ML IJ SOLN
INTRAMUSCULAR | Status: DC | PRN
Start: 2015-05-18 — End: 2015-05-18
  Administered 2015-05-18: 2 mg via INTRAVENOUS

## 2015-05-18 MED ORDER — ONDANSETRON HCL 4 MG/2ML IJ SOLN
INTRAMUSCULAR | Status: DC | PRN
  Administered 2015-05-18: 4 mg via INTRAVENOUS

## 2015-05-18 MED ORDER — HYDROCODONE-ACETAMINOPHEN 5-325 MG PO TABS
ORAL_TABLET | ORAL | Status: AC
  Administered 2015-05-18: 1 via ORAL
  Filled 2015-05-18: qty 1

## 2015-05-18 MED ORDER — FENTANYL CITRATE (PF) 100 MCG/2ML IJ SOLN
25.0000 ug | INTRAMUSCULAR | Status: AC | PRN
  Administered 2015-05-18 (×6): 25 ug via INTRAVENOUS

## 2015-05-18 SURGICAL SUPPLY — 32 items
BACTOSHIELD CHG 4% 4OZ (MISCELLANEOUS) ×2
BASKET ZERO TIP 1.9FR (BASKET) IMPLANT
CATH URETL 5X70 OPEN END (CATHETERS) ×3 IMPLANT
CNTNR SPEC 2.5X3XGRAD LEK (MISCELLANEOUS) ×1
CONRAY 43 FOR UROLOGY 50M (MISCELLANEOUS) ×3 IMPLANT
CONT SPEC 4OZ STER OR WHT (MISCELLANEOUS) ×2
CONTAINER SPEC 2.5X3XGRAD LEK (MISCELLANEOUS) ×1 IMPLANT
FEE TECHNICIAN ONLY PER HOUR (MISCELLANEOUS) IMPLANT
GLOVE BIO SURGEON STRL SZ7 (GLOVE) ×6 IMPLANT
GLOVE BIO SURGEON STRL SZ7.5 (GLOVE) ×3 IMPLANT
GOWN STRL REUS W/ TWL LRG LVL3 (GOWN DISPOSABLE) ×1 IMPLANT
GOWN STRL REUS W/ TWL LRG LVL4 (GOWN DISPOSABLE) ×1 IMPLANT
GOWN STRL REUS W/TWL LRG LVL3 (GOWN DISPOSABLE) ×2
GOWN STRL REUS W/TWL LRG LVL4 (GOWN DISPOSABLE) ×2
GOWN STRL REUS W/TWL XL LVL3 (GOWN DISPOSABLE) ×3 IMPLANT
GUIDEWIRE SUPER STIFF (WIRE) IMPLANT
KIT RM TURNOVER CYSTO AR (KITS) ×3 IMPLANT
LASER FIBER 200M SMARTSCOPE (Laser) ×3 IMPLANT
LASER HOLMIUM FIBER SU 272UM (MISCELLANEOUS) IMPLANT
PACK CYSTO AR (MISCELLANEOUS) ×3 IMPLANT
SCRUB CHG 4% DYNA-HEX 4OZ (MISCELLANEOUS) ×1 IMPLANT
SENSORWIRE 0.038 NOT ANGLED (WIRE) ×3
SET CYSTO W/LG BORE CLAMP LF (SET/KITS/TRAYS/PACK) ×3 IMPLANT
SHEATH URETERAL 13/15X36 1L (SHEATH) IMPLANT
SOL .9 NS 3000ML IRR  AL (IV SOLUTION) ×2
SOL .9 NS 3000ML IRR UROMATIC (IV SOLUTION) ×1 IMPLANT
STENT URET 6FRX24 CONTOUR (STENTS) IMPLANT
STENT URET 6FRX26 CONTOUR (STENTS) ×3 IMPLANT
SURGILUBE 2OZ TUBE FLIPTOP (MISCELLANEOUS) ×3 IMPLANT
SYRINGE IRR TOOMEY STRL 70CC (SYRINGE) ×3 IMPLANT
WATER STERILE IRR 1000ML POUR (IV SOLUTION) ×3 IMPLANT
WIRE SENSOR 0.038 NOT ANGLED (WIRE) ×1 IMPLANT

## 2015-05-18 NOTE — Anesthesia Preprocedure Evaluation (Signed)
Anesthesia Evaluation  Patient identified by MRN, date of birth, ID band Patient awake    Reviewed: Allergy & Precautions, NPO status , Patient's Chart, lab work & pertinent test results  Airway Mallampati: III       Dental  (+) Teeth Intact   Pulmonary    breath sounds clear to auscultation       Cardiovascular Exercise Tolerance: Good hypertension,  Rhythm:Regular Rate:Normal     Neuro/Psych Anxiety    GI/Hepatic Neg liver ROS, GERD  ,  Endo/Other  diabetes, Well Controlled, Type 2, Oral Hypoglycemic Agents  Renal/GU      Musculoskeletal   Abdominal (+) + obese,   Peds  Hematology   Anesthesia Other Findings   Reproductive/Obstetrics                             Anesthesia Physical Anesthesia Plan  ASA: III  Anesthesia Plan: General   Post-op Pain Management:    Induction: Intravenous  Airway Management Planned: Oral ETT  Additional Equipment:   Intra-op Plan:   Post-operative Plan: Extubation in OR  Informed Consent: I have reviewed the patients History and Physical, chart, labs and discussed the procedure including the risks, benefits and alternatives for the proposed anesthesia with the patient or authorized representative who has indicated his/her understanding and acceptance.     Plan Discussed with: CRNA  Anesthesia Plan Comments:         Anesthesia Quick Evaluation

## 2015-05-18 NOTE — Op Note (Signed)
Date of procedure: 05/18/2015  Preoperative diagnosis:  1. Left renal stone   Postoperative diagnosis:  1. Left renal stone   Procedure: 1. Cystoscopy 2. Left ureteroscopy 3. Laser lithotripsy 4. Stone basketing 5. Left retrograde pyelogram with interpretation 6. Left ureteral stent placement 6 French by 26 cm  Surgeon: Baruch Gouty, MD  Anesthesia: General  Complications: None  Intraoperative findings: The stone was visualized on fluoroscopy. At the end of the procedure there is no visible filling defects. There is no filling defects on retrograde pyelogram. Stent was located in the correct position.  EBL: None  Specimens: Left renal stone  Drains: 6 French by 26 cm double-J left ureteral stent  Disposition: Stable to the postanesthesia care unit  Indication for procedure: The patient is a 55 y.o. male with a solitary left kidney and a 8 mm inferior pole stone who presents for definitive management .  After reviewing the management options for treatment, the patient elected to proceed with the above surgical procedure(s). We have discussed the potential benefits and risks of the procedure, side effects of the proposed treatment, the likelihood of the patient achieving the goals of the procedure, and any potential problems that might occur during the procedure or recuperation. Informed consent has been obtained.  Description of procedure: The patient was met in the preoperative area. All risks, benefits, and indications of the procedure were described in great detail. The patient consented to the procedure. Preoperative antibiotics were given. The patient was taken to the operative theater. General anesthesia was induced per the anesthesia service. The patient was then placed in the dorsal lithotomy position and prepped and draped in the usual sterile fashion. A preoperative timeout was called.    21 French 30 cystoscope was inserted into the patient's bladder per urethra  atraumatically. The left ureteral orifice was intubated with a dual-lumen catheter. 2 sensor wires were then placed up to the level the renal pelvis under fluoroscopy. The cystoscope was then withdrawn. Over one of the sensor wires, the ureteral access sheath was placed under fluoroscopy atraumatically. The inner sheath and the second sensor wire removed. Pan nephroscopy was then carried out in a systematic fashion. In the lower pole there is noted to be 2 stones. These were moved to the upper pole with stone basket. They were then broken into small pieces with laser lithotripsy. These pieces were removed through the ureteral access sheath with stone basket. Pan nephroscopy is unremarkable for any residual fragments at the end the procedure. Retropyelogram was obtained showing no residual filling defects. The ureteral access sheath was then removed under direct vision. The cystoscope was then assembled remaining sensor wire. A 6 French by 26 over double-J ureteral stent was placed over the sensor wire. Sensor wire was removed. The stent was confirmed to be in the correct location with a curl seen in the patient's renal pelvis on fluoroscopy and seen in the patient's urinary bladder direct position. This point the patient's bladder was drained. He was then woken from anesthesia and transferred in stable condition to postanesthesia care unit.   Plan: The patient will follow-up in one week for stent removal. He will have an ultrasound 1 month after stent removal. Baruch Gouty, M.D.

## 2015-05-18 NOTE — Telephone Encounter (Signed)
-----   Message from Nickie Retort, MD sent at 05/18/2015 12:56 PM EST ----- Patient needs to f/u in one week for stent removal. Thanks

## 2015-05-18 NOTE — Discharge Instructions (Signed)

## 2015-05-18 NOTE — Anesthesia Procedure Notes (Signed)
Procedure Name: LMA Insertion Date/Time: 05/18/2015 12:02 PM Performed by: Jonna Clark Pre-anesthesia Checklist: Patient identified, Patient being monitored, Timeout performed, Emergency Drugs available and Suction available Patient Re-evaluated:Patient Re-evaluated prior to inductionOxygen Delivery Method: Circle system utilized Preoxygenation: Pre-oxygenation with 100% oxygen Intubation Type: IV induction Ventilation: Mask ventilation without difficulty LMA: LMA inserted LMA Size: 4.5 Tube type: Oral Number of attempts: 1 Placement Confirmation: positive ETCO2 and breath sounds checked- equal and bilateral Tube secured with: Tape Dental Injury: Teeth and Oropharynx as per pre-operative assessment  Difficulty Due To: Difficulty was anticipated and Difficult Airway- due to anterior larynx Future Recommendations: Recommend- induction with short-acting agent, and alternative techniques readily available Comments: Multiple attempts at intubation. Unsuccessful due to anterior airway. Used mcgrath as well as bougie

## 2015-05-18 NOTE — Progress Notes (Signed)
Bladder scanner done  54ml noted  abd soft

## 2015-05-18 NOTE — H&P (View-Only) (Signed)
05/05/2015 2:50 PM   Anthony Fuller [birth year] [id number]  Referring provider: Valera Castle, MD [address] [place], [place] [address]  Chief Complaint  Patient presents with  . New Patient (Initial Visit)    kidney stones     HPI: The patient is a 55 year old gentleman with a past history significant for right nephrectomy who presents with a left lower pole nonobstructing 8 mm stone. He is asymptomatic from the stones at this time. He does have lower back pain but not localized left flank.  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 He had negative margins and no lymphovascular invasion.  PMH: Past Medical History  Diagnosis Date  . Arthritis   . GERD (gastroesophageal reflux disease)   . Hypertension   . Diabetes mellitus without complication ([place])   . Cancer ([place]) 2012    R kidney removed  . 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    Surgical History: Past Surgical History  Procedure Laterality Date  . Spine surgery      lumbar 2013  . Kidney removed Right     2012  . Back surgery    . Nephrectomy Right 04/2011    Home Medications:    Medication List       This list is accurate as of: 05/05/15  2:50 PM.  Always use your most recent med list.               albuterol 108 (90 BASE) MCG/ACT inhaler  Commonly known as:  PROVENTIL HFA;VENTOLIN HFA  Inhale into the lungs every 6 (six) hours as needed for wheezing or shortness of breath.     canagliflozin 100 MG Tabs tablet  Commonly known as:  INVOKANA  Take 100 mg by mouth.     clonazePAM 0.5 MG tablet  Commonly known as:  KLONOPIN  Take 0.5 mg by mouth 2 (two) times daily as needed for anxiety.     dicyclomine 10 MG capsule  Commonly known as:  BENTYL  Take 10 mg by mouth 4 (four) times daily -  before meals and at bedtime.     HYDROcodone-acetaminophen 5-325 MG tablet  Commonly known as:  NORCO/VICODIN  TAKE 1-2 TABLETS BY MOPUTH  EVERY 4 HOURS AS NEEDED FOR PAIN     hydrocortisone 25 MG suppository  Commonly known as:  ANUSOL-HC  Place rectally.     JARDIANCE 10 MG Tabs tablet  Generic drug:  empagliflozin  Take by mouth.     loratadine 10 MG tablet  Commonly known as:  CLARITIN  Take 10 mg by mouth daily.     meloxicam 15 MG tablet  Commonly known as:  MOBIC  TAKE 1 TABLET (15 MG TOTAL) BY MOUTH ONCE DAILY.     metFORMIN 500 MG (MOD) 24 hr tablet  Commonly known as:  GLUMETZA  Take by mouth.     multivitamin capsule  Take 1 capsule by mouth daily.     nortriptyline 50 MG capsule  Commonly known as:  PAMELOR  Take by mouth.     omeprazole 40 MG capsule  Commonly known as:  PRILOSEC  Take 40 mg by mouth daily.     ONETOUCH VERIO test strip  Generic drug:  glucose blood  2 (two) times daily. for testing     oxyCODONE-acetaminophen 10-325 MG tablet  Commonly known as:  PERCOCET  Take 1 tablet by mouth every 4 (four) hours as needed  for pain.     pregabalin 150 MG capsule  Commonly known as:  LYRICA  Take 150 mg by mouth 2 (two) times daily.     pregabalin 50 MG capsule  Commonly known as:  LYRICA  Take 50 mg by mouth 3 (three) times daily.     sucralfate 1 G tablet  Commonly known as:  CARAFATE  Take 1 g by mouth 4 (four) times daily -  with meals and at bedtime.     tamsulosin 0.4 MG Caps capsule  Commonly known as:  FLOMAX  Take 0.4 mg by mouth.     terbinafine 250 MG tablet  Commonly known as:  LAMISIL  Take 250 mg by mouth daily.     tiZANidine 4 MG capsule  Commonly known as:  ZANAFLEX  TAKE 1-2 CAPS THREE (3) TIMES DAILY     traMADol 50 MG tablet  Commonly known as:  ULTRAM  Take 50 mg by mouth every 6 (six) hours as needed. for pain        Allergies:  Allergies  Allergen Reactions  . Floxin [Ofloxacin]   . Hydromorphone     Family History: Family History  Problem Relation Age of Onset  . Diabetes Mother   . Hypertension Mother   . Asthma Mother   . Heart  disease Father   . Kidney disease Father   . Diabetes Father   . Stroke Father     Social History:  reports that he has never smoked. He has never used smokeless tobacco. He reports that he does not drink alcohol or use illicit drugs.  ROS: UROLOGY Frequent Urination?: No Hard to postpone urination?: Yes Burning/pain with urination?: No Get up at night to urinate?: No Leakage of urine?: No Urine stream starts and stops?: Yes Trouble starting stream?: No Do you have to strain to urinate?: Yes Blood in urine?: No Urinary tract infection?: No Sexually transmitted disease?: No Injury to kidneys or bladder?: Yes Painful intercourse?: No Weak stream?: Yes Erection problems?: No Penile pain?: No  Gastrointestinal Nausea?: Yes Vomiting?: No Indigestion/heartburn?: No Diarrhea?: Yes Constipation?: No  Constitutional Fever: No Night sweats?: No Weight loss?: No Fatigue?: No  Skin Skin rash/lesions?: No Itching?: No  Eyes Blurred vision?: No Double vision?: No  Ears/Nose/Throat Sore throat?: No Sinus problems?: No  Hematologic/Lymphatic Swollen glands?: No Easy bruising?: No  Cardiovascular Leg swelling?: No Chest pain?: No  Respiratory Cough?: No Shortness of breath?: No  Endocrine Excessive thirst?: No  Musculoskeletal Back pain?: Yes Joint pain?: No  Neurological Headaches?: No Dizziness?: Yes  Psychologic Depression?: No Anxiety?: Yes  Physical Exam: BP 109/65 mmHg  Pulse 67  Ht 6\' 2"  (1.88 m)  Wt 264 lb 1.6 oz (119.795 kg)  BMI 33.89 kg/m2  Constitutional:  Alert and oriented, No acute distress. HEENT: [place] AT, moist mucus membranes.  Trachea midline, no masses. Cardiovascular: No clubbing, cyanosis, or edema. Respiratory: Normal respiratory effort, no increased work of breathing. GI: Abdomen is soft, nontender, nondistended, no abdominal masses GU: No CVA tenderness. Normal phallus. Testicles and equal bilaterally. Left varicocele  noted. No masses. Skin: No rashes, bruises or suspicious lesions. Lymph: No cervical or inguinal adenopathy. Neurologic: Grossly intact, no focal deficits, moving all 4 extremities. Psychiatric: Normal mood and affect.  Laboratory Data: Lab Results  Component Value Date   WBC 7.3 06/01/2014   HGB 14.8 06/03/2014   HCT 44.0 06/03/2014   MCV 89 06/01/2014   PLT 162 06/01/2014    Lab Results  Component Value Date   CREATININE 1.19 06/03/2014    No results found for: PSA  No results found for: TESTOSTERONE  No results found for: HGBA1C  Urinalysis    Component Value Date/Time   COLORURINE YELLOW 04/30/2015 0935   COLORURINE Yellow 05/31/2014 0637   APPEARANCEUR CLEAR 04/30/2015 0935   APPEARANCEUR Turbid 05/31/2014 0637   LABSPEC 1.020 04/30/2015 0935   LABSPEC 1.029 05/31/2014 0637   PHURINE 6.5 04/30/2015 0935   PHURINE 5.0 05/31/2014 0637   GLUCOSEU 250* 04/30/2015 0935   GLUCOSEU 50 mg/dL 05/31/2014 0637   HGBUR TRACE* 04/30/2015 0935   HGBUR Negative 05/31/2014 [place] 04/30/2015 0935   BILIRUBINUR Negative 05/31/2014 [place] 04/30/2015 0935   KETONESUR Negative 05/31/2014 0637   PROTEINUR NEGATIVE 04/30/2015 0935   PROTEINUR 30 mg/dL 05/31/2014 0637   NITRITE NEGATIVE 04/30/2015 0935   NITRITE Negative 05/31/2014 0637   LEUKOCYTESUR NEGATIVE 04/30/2015 0935   LEUKOCYTESUR Negative 05/31/2014 0637    Pertinent Imaging:  CLINICAL DATA: Worsening lower abdominal and pelvic pain for 6 months. Abdominal distention. Cirrhosis. Previous right nephrectomy for renal cell carcinoma.  EXAM: CT ABDOMEN AND PELVIS WITHOUT AND WITH CONTRAST  TECHNIQUE: Multidetector CT imaging of the abdomen and pelvis was performed following the standard protocol before and following the bolus administration of intravenous contrast.  CONTRAST: 180mL OMNIPAQUE IOHEXOL 350 MG/ML SOLN  COMPARISON: 05/31/2014 and  11/13/2013  FINDINGS: Lower chest: No acute findings.  Hepatobiliary: No masses or other significant abnormality. Gallbladder is unremarkable.  Pancreas: No mass, inflammatory changes, or other significant abnormality.  Spleen: Within normal limits in size and appearance.  Adrenals/Urinary Tract: Adrenal glands are unremarkable. Prior right nephrectomy. Several left renal calculi are seen, largest in lower pole measuring 8 mm. No evidence of hydronephrosis. No evidence of left renal mass. unopacified urinary bladder is unremarkable in appearance.  Stomach/Bowel: No evidence of obstruction, inflammatory process, or abnormal fluid collections. Normal appendix visualized.  Vascular/Lymphatic: No pathologically enlarged lymph nodes. No evidence of abdominal aortic aneurysm.  Reproductive: No mass or other significant abnormality.  Other: Small right inguinal hernia is again seen containing only fat. No evidence herniated bowel loops.  Musculoskeletal: No suspicious bone lesions identified.  IMPRESSION: No acute findings within the abdomen or pelvis.  Nonobstructive left nephrolithiasis. Previous right nephrectomy. No evidence of recurrent or metastatic carcinoma.  No evidence of hepatic mass, splenomegaly, or ascites.  Stable small right inguinal hernia containing only fat.     Assessment & Plan:    I discussed the treatment options for renal calculi with the patient. We discussed lithotripsy. I do not think he is a good candidate for this as he has a solitary kidney as well as a lower pole stone. He risk going into renal failure if he develops an obstructing stone in solitary kidney. We discussed ureteroscopy in great detail. I feel that would be the best option for him given that he has a solitary kidney in the lower pole stone. We discussed the risks, benefits, and indications of ureteroscopy. We also discussed the need for multiple procedures as a risk  factor. The patient is agreeable wishes to proceed.  1. Left nonobstructing 8 mm lower pole calculus and a solitary kidney Cystoscopy, left ureteroscopy, laser lithotripsy, left retropyelogram, left ureteral stent  2. History of right renal cell carcinoma No evidence of disease  Return for surgery.  Nickie Retort, MD  [facility] [address] [place], [place]  [address] [phone]   

## 2015-05-18 NOTE — Transfer of Care (Signed)
Immediate Anesthesia Transfer of Care Note  Patient: Anthony Fuller  Procedure(s) Performed: Procedure(s): URETEROSCOPY WITH HOLMIUM LASER LITHOTRIPSY (Left) CYSTOSCOPY WITH RETROGRADE PYELOGRAM (Left) CYSTOSCOPY WITH STENT PLACEMENT (Left)  Patient Location: PACU  Anesthesia Type:General  Level of Consciousness: sedated and responds to stimulation  Airway & Oxygen Therapy: Patient Spontanous Breathing and Patient connected to face mask oxygen  Post-op Assessment: Report given to RN and Post -op Vital signs reviewed and stable  Post vital signs: Reviewed and stable  Last Vitals:  Filed Vitals:   05/18/15 0952 05/18/15 1300  BP: 141/93 136/79  Pulse: 67 68  Temp: 36.7 C 36.3 C  Resp: 16 21    Complications: No apparent anesthesia complications

## 2015-05-18 NOTE — Progress Notes (Signed)
Called dr Pilar Jarvis about hydromorphine allergy  For b and o    Dr Aggie Cosier to give pt b and o supp

## 2015-05-18 NOTE — Interval H&P Note (Signed)
History and Physical Interval Note:  05/18/2015 11:20 AM  Anthony Fuller  has presented today for surgery, with the diagnosis of RENAL STONE,SOLITARY KIDNEY  The various methods of treatment have been discussed with the patient and family. After consideration of risks, benefits and other options for treatment, the patient has consented to  Procedure(s): URETEROSCOPY WITH HOLMIUM LASER LITHOTRIPSY (Left) CYSTOSCOPY WITH RETROGRADE PYELOGRAM (Left) CYSTOSCOPY WITH STENT PLACEMENT (Left) as a surgical intervention .  The patient's history has been reviewed, patient examined, no change in status, stable for surgery.  I have reviewed the patient's chart and labs.  Questions were answered to the patient's satisfaction.    RRR Unlabored respirations  Horald Pollen Campbell

## 2015-05-18 NOTE — Progress Notes (Signed)
Called dr Izetta Dakin    No more pain medication orders   To take pt to sds and get  Up in chair blood pressure 138/84   Heart rate 71

## 2015-05-19 ENCOUNTER — Encounter: Payer: Self-pay | Admitting: Urology

## 2015-05-19 NOTE — Anesthesia Postprocedure Evaluation (Signed)
Anesthesia Post Note  Patient: Anthony Fuller  Procedure(s) Performed: Procedure(s) (LRB): URETEROSCOPY WITH HOLMIUM LASER LITHOTRIPSY (Left) CYSTOSCOPY WITH RETROGRADE PYELOGRAM (Left) CYSTOSCOPY WITH STENT PLACEMENT (Left)  Patient location during evaluation: PACU Anesthesia Type: General Level of consciousness: awake and alert Pain management: pain level not controlled Vital Signs Assessment: post-procedure vital signs reviewed and stable Respiratory status: spontaneous breathing Cardiovascular status: stable Anesthetic complications: no    Last Vitals:  Filed Vitals:   05/18/15 1457 05/18/15 1513  BP: 125/75   Pulse: 65 66  Temp:    Resp: 16 16    Last Pain:  Filed Vitals:   05/18/15 1514  PainSc: 5                  VAN STAVEREN,Jannis Atkins

## 2015-05-20 ENCOUNTER — Ambulatory Visit: Admit: 2015-05-20 | Payer: 59 | Admitting: Gastroenterology

## 2015-05-20 ENCOUNTER — Telehealth: Payer: Self-pay | Admitting: Radiology

## 2015-05-20 SURGERY — COLONOSCOPY WITH PROPOFOL
Anesthesia: General

## 2015-05-20 NOTE — Telephone Encounter (Signed)
LMOM. Need to notify pt his FMLA paperwork is ready to be picked up.

## 2015-05-23 NOTE — Addendum Note (Signed)
Addendum  created 05/23/15 [id number] by Johnna Acosta, CRNA   Modules edited: Anesthesia Responsible Staff

## 2015-05-24 ENCOUNTER — Telehealth: Payer: Self-pay

## 2015-05-24 NOTE — Telephone Encounter (Signed)
Pt wife called stating pt is having urinary frequency, leakage, and pain-8/10. Reinforced with pt wife this is normal for a stent. Please advise.

## 2015-05-24 NOTE — Telephone Encounter (Signed)
If he comes in for a PVR to ensure he is not in retention, we can give him some mybetriq 25 mg samples if we have them.

## 2015-05-26 ENCOUNTER — Other Ambulatory Visit: Payer: 59

## 2015-05-26 LAB — STONE ANALYSIS
Ca Oxalate,Monohydr.: 95 %
Ca phos cry stone ql IR: 5 %
Stone Weight KSTONE: 47 mg

## 2015-05-27 ENCOUNTER — Emergency Department: Payer: 59

## 2015-05-27 ENCOUNTER — Inpatient Hospital Stay
Admission: EM | Admit: 2015-05-27 | Discharge: 2015-05-30 | DRG: 689 | Disposition: A | Discharge status: Home / Self Care | Payer: 59 | Attending: Internal Medicine | Admitting: Internal Medicine

## 2015-05-27 ENCOUNTER — Encounter: Payer: Self-pay | Admitting: Emergency Medicine

## 2015-05-27 ENCOUNTER — Ambulatory Visit (INDEPENDENT_AMBULATORY_CARE_PROVIDER_SITE_OTHER): Payer: 59 | Admitting: Obstetrics and Gynecology

## 2015-05-27 ENCOUNTER — Encounter: Payer: Self-pay | Admitting: Obstetrics and Gynecology

## 2015-05-27 VITALS — BP 144/89 | HR 82 | Temp 100.2°F | Resp 16

## 2015-05-27 DIAGNOSIS — Z8249 Family history of ischemic heart disease and other diseases of the circulatory system: Secondary | ICD-10-CM | POA: Diagnosis not present

## 2015-05-27 DIAGNOSIS — E1110 Type 2 diabetes mellitus with ketoacidosis without coma: Secondary | ICD-10-CM

## 2015-05-27 DIAGNOSIS — N182 Chronic kidney disease, stage 2 (mild): Secondary | ICD-10-CM | POA: Diagnosis present

## 2015-05-27 DIAGNOSIS — Z905 Acquired absence of kidney: Secondary | ICD-10-CM

## 2015-05-27 DIAGNOSIS — Z9889 Other specified postprocedural states: Secondary | ICD-10-CM | POA: Diagnosis not present

## 2015-05-27 DIAGNOSIS — T8140XA Infection following a procedure, unspecified, initial encounter: Secondary | ICD-10-CM

## 2015-05-27 DIAGNOSIS — T814XXA Infection following a procedure, initial encounter: Secondary | ICD-10-CM | POA: Diagnosis not present

## 2015-05-27 DIAGNOSIS — Z825 Family history of asthma and other chronic lower respiratory diseases: Secondary | ICD-10-CM | POA: Diagnosis not present

## 2015-05-27 DIAGNOSIS — M1990 Unspecified osteoarthritis, unspecified site: Secondary | ICD-10-CM | POA: Diagnosis present

## 2015-05-27 DIAGNOSIS — E871 Hypo-osmolality and hyponatremia: Secondary | ICD-10-CM | POA: Diagnosis present

## 2015-05-27 DIAGNOSIS — Z833 Family history of diabetes mellitus: Secondary | ICD-10-CM | POA: Diagnosis not present

## 2015-05-27 DIAGNOSIS — B952 Enterococcus as the cause of diseases classified elsewhere: Secondary | ICD-10-CM | POA: Diagnosis present

## 2015-05-27 DIAGNOSIS — N10 Acute tubulo-interstitial nephritis: Secondary | ICD-10-CM | POA: Diagnosis not present

## 2015-05-27 DIAGNOSIS — K219 Gastro-esophageal reflux disease without esophagitis: Secondary | ICD-10-CM | POA: Diagnosis present

## 2015-05-27 DIAGNOSIS — C641 Malignant neoplasm of right kidney, except renal pelvis: Secondary | ICD-10-CM | POA: Diagnosis not present

## 2015-05-27 DIAGNOSIS — Z87442 Personal history of urinary calculi: Secondary | ICD-10-CM

## 2015-05-27 DIAGNOSIS — Z841 Family history of disorders of kidney and ureter: Secondary | ICD-10-CM | POA: Diagnosis not present

## 2015-05-27 DIAGNOSIS — Z85528 Personal history of other malignant neoplasm of kidney: Secondary | ICD-10-CM | POA: Diagnosis not present

## 2015-05-27 DIAGNOSIS — R109 Unspecified abdominal pain: Secondary | ICD-10-CM

## 2015-05-27 DIAGNOSIS — N179 Acute kidney failure, unspecified: Secondary | ICD-10-CM | POA: Diagnosis present

## 2015-05-27 DIAGNOSIS — Q600 Renal agenesis, unilateral: Secondary | ICD-10-CM

## 2015-05-27 DIAGNOSIS — IMO0002 Reserved for concepts with insufficient information to code with codable children: Secondary | ICD-10-CM

## 2015-05-27 DIAGNOSIS — Z8619 Personal history of other infectious and parasitic diseases: Secondary | ICD-10-CM

## 2015-05-27 DIAGNOSIS — R079 Chest pain, unspecified: Secondary | ICD-10-CM

## 2015-05-27 DIAGNOSIS — I129 Hypertensive chronic kidney disease with stage 1 through stage 4 chronic kidney disease, or unspecified chronic kidney disease: Secondary | ICD-10-CM | POA: Diagnosis present

## 2015-05-27 DIAGNOSIS — Z823 Family history of stroke: Secondary | ICD-10-CM

## 2015-05-27 DIAGNOSIS — E1310 Other specified diabetes mellitus with ketoacidosis without coma: Secondary | ICD-10-CM | POA: Diagnosis present

## 2015-05-27 DIAGNOSIS — D72829 Elevated white blood cell count, unspecified: Secondary | ICD-10-CM

## 2015-05-27 DIAGNOSIS — N12 Tubulo-interstitial nephritis, not specified as acute or chronic: Secondary | ICD-10-CM

## 2015-05-27 DIAGNOSIS — Z79899 Other long term (current) drug therapy: Secondary | ICD-10-CM

## 2015-05-27 DIAGNOSIS — Z888 Allergy status to other drugs, medicaments and biological substances status: Secondary | ICD-10-CM

## 2015-05-27 DIAGNOSIS — T8384XA Pain from genitourinary prosthetic devices, implants and grafts, initial encounter: Secondary | ICD-10-CM | POA: Diagnosis present

## 2015-05-27 LAB — MICROSCOPIC EXAMINATION: RBC, UA: >30 /hpf — ABNORMAL HIGH (ref 0–?)

## 2015-05-27 LAB — CBC
HCT: 47.8 % (ref 40.0–52.0)
Hemoglobin: 15.8 g/dL (ref 13.0–18.0)
MCH: 28.9 pg (ref 26.0–34.0)
MCHC: 33 g/dL (ref 32.0–36.0)
MCV: 87.7 fL (ref 80.0–100.0)
Platelets: 145 10*3/uL — ABNORMAL LOW (ref 150–440)
RBC: 5.45 MIL/uL (ref 4.40–5.90)
RDW: 13.7 % (ref 11.5–14.5)
WBC: 15.7 10*3/uL — ABNORMAL HIGH (ref 3.8–10.6)

## 2015-05-27 LAB — URINALYSIS COMPLETE WITH MICROSCOPIC (ARMC ONLY)
Bacteria, UA: NONE SEEN
Bilirubin Urine: NEGATIVE
Glucose, UA: >500 mg/dL — AB
Ketones, ur: NEGATIVE mg/dL
Nitrite: NEGATIVE
Protein, ur: 30 mg/dL — AB
Specific Gravity, Urine: 1.024 (ref 1.005–1.030)
Squamous Epithelial / LPF: NONE SEEN
pH: 6 (ref 5.0–8.0)

## 2015-05-27 LAB — URINALYSIS, COMPLETE
Bilirubin, UA: NEGATIVE
Ketones, UA: NEGATIVE
Nitrite, UA: NEGATIVE
Specific Gravity, UA: 1.01 (ref 1.005–1.030)
Urobilinogen, Ur: 0.2 mg/dL (ref 0.2–1.0)
pH, UA: 5.5 (ref 5.0–7.5)

## 2015-05-27 LAB — BASIC METABOLIC PANEL
Anion gap: 12 (ref 5–15)
BUN: 21 mg/dL — ABNORMAL HIGH (ref 6–20)
CO2: 24 mmol/L (ref 22–32)
Calcium: 9.1 mg/dL (ref 8.9–10.3)
Chloride: 96 mmol/L — ABNORMAL LOW (ref 101–111)
Creatinine, Ser: 1.62 mg/dL — ABNORMAL HIGH (ref 0.61–1.24)
GFR calc Af Amer: 55 mL/min — ABNORMAL LOW (ref >60)
GFR calc non Af Amer: 47 mL/min — ABNORMAL LOW (ref >60)
Glucose, Bld: 284 mg/dL — ABNORMAL HIGH (ref 65–99)
Potassium: 4.2 mmol/L (ref 3.5–5.1)
Sodium: 132 mmol/L — ABNORMAL LOW (ref 135–145)

## 2015-05-27 LAB — TROPONIN I
Troponin I: 0.03 ng/mL (ref <0.031)
Troponin I: <0.03 ng/mL (ref <0.031)

## 2015-05-27 LAB — GLUCOSE, CAPILLARY: Glucose-Capillary: 158 mg/dL — ABNORMAL HIGH (ref 65–99)

## 2015-05-27 MED ORDER — ONDANSETRON HCL 4 MG/2ML IJ SOLN
4.0000 mg | Freq: Once | INTRAMUSCULAR | Status: AC
  Administered 2015-05-27: 4 mg via INTRAVENOUS
  Filled 2015-05-27: qty 2

## 2015-05-27 MED ORDER — CANAGLIFLOZIN 100 MG PO TABS
100.0000 mg | ORAL_TABLET | Freq: Every day | ORAL | Status: DC
  Administered 2015-05-29: 100 mg via ORAL
  Filled 2015-05-27 (×4): qty 1

## 2015-05-27 MED ORDER — PREGABALIN 75 MG PO CAPS
150.0000 mg | ORAL_CAPSULE | Freq: Every day | ORAL | Status: DC
  Administered 2015-05-27 – 2015-05-29 (×3): 150 mg via ORAL
  Filled 2015-05-27 (×3): qty 2

## 2015-05-27 MED ORDER — DEXTROSE 5 % IV SOLN
1.0000 g | INTRAVENOUS | Status: DC
  Administered 2015-05-27 – 2015-05-28 (×2): 1 g via INTRAVENOUS
  Filled 2015-05-27 (×3): qty 10

## 2015-05-27 MED ORDER — ONDANSETRON HCL 4 MG/2ML IJ SOLN
INTRAMUSCULAR | Status: AC
  Administered 2015-05-27: 4 mg via INTRAVENOUS
  Filled 2015-05-27: qty 2

## 2015-05-27 MED ORDER — EMPAGLIFLOZIN 10 MG PO TABS
10.0000 mg | ORAL_TABLET | Freq: Every day | ORAL | Status: DC
  Filled 2015-05-27: qty 10

## 2015-05-27 MED ORDER — LORATADINE 10 MG PO TABS: 10.0000 mg | ORAL_TABLET | Freq: Every day | ORAL | Status: DC | PRN

## 2015-05-27 MED ORDER — MORPHINE SULFATE (PF) 4 MG/ML IV SOLN
INTRAVENOUS | Status: AC
  Administered 2015-05-27: 4 mg via INTRAVENOUS
  Filled 2015-05-27: qty 1

## 2015-05-27 MED ORDER — SODIUM CHLORIDE 0.9 % IV SOLN
INTRAVENOUS | Status: DC
  Administered 2015-05-27 – 2015-05-28 (×2): via INTRAVENOUS

## 2015-05-27 MED ORDER — INSULIN ASPART 100 UNIT/ML ~~LOC~~ SOLN
0.0000 [IU] | Freq: Three times a day (TID) | SUBCUTANEOUS | Status: DC
  Administered 2015-05-28 (×2): 2 [IU] via SUBCUTANEOUS
  Administered 2015-05-28: 1 [IU] via SUBCUTANEOUS
  Administered 2015-05-29 (×2): 2 [IU] via SUBCUTANEOUS
  Administered 2015-05-29 – 2015-05-30 (×3): 1 [IU] via SUBCUTANEOUS
  Filled 2015-05-27: qty 3
  Filled 2015-05-27 (×3): qty 1
  Filled 2015-05-27 (×3): qty 2
  Filled 2015-05-27: qty 1

## 2015-05-27 MED ORDER — TRAMADOL HCL 50 MG PO TABS
50.0000 mg | ORAL_TABLET | Freq: Four times a day (QID) | ORAL | Status: DC | PRN
  Administered 2015-05-27 – 2015-05-29 (×5): 50 mg via ORAL
  Filled 2015-05-27 (×5): qty 1

## 2015-05-27 MED ORDER — PREGABALIN 25 MG PO CAPS
50.0000 mg | ORAL_CAPSULE | ORAL | Status: DC
  Administered 2015-05-28 – 2015-05-30 (×3): 50 mg via ORAL
  Filled 2015-05-27 (×3): qty 2

## 2015-05-27 MED ORDER — SODIUM CHLORIDE 0.9 % IV BOLUS (SEPSIS)
1000.0000 mL | Freq: Once | INTRAVENOUS | Status: AC
  Administered 2015-05-27: 1000 mL via INTRAVENOUS

## 2015-05-27 MED ORDER — PANTOPRAZOLE SODIUM 40 MG PO TBEC
40.0000 mg | DELAYED_RELEASE_TABLET | Freq: Every day | ORAL | Status: DC
  Administered 2015-05-27 – 2015-05-30 (×4): 40 mg via ORAL
  Filled 2015-05-27 (×4): qty 1

## 2015-05-27 MED ORDER — HYDROCODONE-ACETAMINOPHEN 5-325 MG PO TABS
1.0000 | ORAL_TABLET | Freq: Once | ORAL | Status: AC
  Administered 2015-05-27: 1 via ORAL

## 2015-05-27 MED ORDER — TIZANIDINE HCL 4 MG PO TABS
4.0000 mg | ORAL_TABLET | Freq: Three times a day (TID) | ORAL | Status: DC | PRN
  Administered 2015-05-28: 4 mg via ORAL
  Filled 2015-05-27: qty 1

## 2015-05-27 MED ORDER — TAMSULOSIN HCL 0.4 MG PO CAPS
0.4000 mg | ORAL_CAPSULE | Freq: Every day | ORAL | Status: DC
  Administered 2015-05-27 – 2015-05-29 (×3): 0.4 mg via ORAL
  Filled 2015-05-27 (×3): qty 1

## 2015-05-27 MED ORDER — TERBINAFINE HCL 250 MG PO TABS
250.0000 mg | ORAL_TABLET | Freq: Every day | ORAL | Status: DC
  Administered 2015-05-27 – 2015-05-29 (×3): 250 mg via ORAL
  Filled 2015-05-27 (×4): qty 1

## 2015-05-27 MED ORDER — HEPARIN SODIUM (PORCINE) 5000 UNIT/ML IJ SOLN
5000.0000 [IU] | Freq: Three times a day (TID) | INTRAMUSCULAR | Status: DC
  Administered 2015-05-27 – 2015-05-30 (×9): 5000 [IU] via SUBCUTANEOUS
  Filled 2015-05-27 (×9): qty 1

## 2015-05-27 MED ORDER — CITALOPRAM HYDROBROMIDE 20 MG PO TABS
20.0000 mg | ORAL_TABLET | Freq: Every day | ORAL | Status: DC
  Administered 2015-05-27 – 2015-05-29 (×3): 20 mg via ORAL
  Filled 2015-05-27 (×3): qty 1

## 2015-05-27 MED ORDER — ONDANSETRON HCL 4 MG/2ML IJ SOLN
4.0000 mg | Freq: Once | INTRAMUSCULAR | Status: AC
  Administered 2015-05-27: 4 mg via INTRAVENOUS

## 2015-05-27 MED ORDER — NORTRIPTYLINE HCL 25 MG PO CAPS
50.0000 mg | ORAL_CAPSULE | Freq: Every day | ORAL | Status: DC
  Administered 2015-05-27 – 2015-05-29 (×3): 50 mg via ORAL
  Filled 2015-05-27 (×3): qty 2

## 2015-05-27 MED ORDER — DICYCLOMINE HCL 10 MG PO CAPS
10.0000 mg | ORAL_CAPSULE | Freq: Three times a day (TID) | ORAL | Status: DC | PRN
  Administered 2015-05-27: 10 mg via ORAL
  Filled 2015-05-27: qty 1

## 2015-05-27 MED ORDER — HYDROCODONE-ACETAMINOPHEN 5-325 MG PO TABS
1.0000 | ORAL_TABLET | ORAL | Status: DC | PRN
  Administered 2015-05-27 – 2015-05-30 (×13): 2 via ORAL
  Filled 2015-05-27 (×2): qty 2
  Filled 2015-05-27: qty 1
  Filled 2015-05-27 (×13): qty 2

## 2015-05-27 MED ORDER — DEXTROSE 5 % IV SOLN
1.0000 g | Freq: Once | INTRAVENOUS | Status: AC
  Administered 2015-05-27: 1 g via INTRAVENOUS
  Filled 2015-05-27: qty 10

## 2015-05-27 MED ORDER — MORPHINE SULFATE (PF) 4 MG/ML IV SOLN
4.0000 mg | Freq: Once | INTRAVENOUS | Status: AC
  Administered 2015-05-27: 4 mg via INTRAVENOUS

## 2015-05-27 NOTE — ED Notes (Signed)
Pt sent to ed for further eval of fever. Pt with left flank pain for two days, last week had stent placed for failing left kidney. Pt only has one kidney.

## 2015-05-27 NOTE — ED Provider Notes (Signed)
[facility] Emergency Department Provider Note  ____________________________________________  Time seen: 11:00 AM  I have reviewed the triage vital signs and the nursing notes.   HISTORY  Chief Complaint Fever and Flank Pain    HPI Anthony Fuller is a 55 y.o. male sent to ED from urology clinic due to fever left flank pain and dysuria for 2 days. He is status post right nephrectomy due to renal cell carcinoma, and has a left ureteral stent. Denies vomiting chest pain shortness of breath or dizziness.     Past Medical History  Diagnosis Date  . Arthritis   . GERD (gastroesophageal reflux disease)   . Hypertension   . Diabetes mellitus without complication ([place])   . Cancer ([place]) 2012    R kidney removed  . 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  Marland Kitchen Anxiety      Patient Active Problem List   Diagnosis Date Noted  . Enthesopathy of hip 04/11/2015  . Hepatic cirrhosis ([place]) 09/12/2014  . Fatty liver disease, nonalcoholic [id number]  . Cervical pain 08/06/2014  . Clostridium difficile infection 02/04/2014  . D (diarrhea) 12/17/2013  . Arthralgia of hip 08/26/2013  . H/O urinary disorder 06/29/2013  . Acid indigestion 02/14/2012  . LBP (low back pain) 12/18/2011  . 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 Lumbar radiculopathy 09/05/2011  . Degeneration of intervertebral disc of lumbar region 08/23/2011  . Lumbar canal stenosis 08/23/2011  . 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 Thoracic and lumbosacral neuritis 07/24/2011  . Diabetes mellitus ([place]) 07/24/2011     Past Surgical History  Procedure Laterality Date  . Spine surgery      lumbar 2013  . Kidney removed Right     2012  . Back surgery    . Nephrectomy Right 04/2011  . Ureteroscopy with holmium laser lithotripsy Left 05/18/2015    Procedure: URETEROSCOPY WITH HOLMIUM LASER LITHOTRIPSY;  Surgeon: Nickie Retort, MD;  Location: [facility]  ORS;  Service: Urology;  Laterality: Left;  . Cystoscopy w/ retrogrades Left 05/18/2015    Procedure: CYSTOSCOPY WITH RETROGRADE PYELOGRAM;  Surgeon: Nickie Retort, MD;  Location: [facility] ORS;  Service: Urology;  Laterality: Left;  . Cystoscopy with stent placement Left 05/18/2015    Procedure: CYSTOSCOPY WITH STENT PLACEMENT;  Surgeon: Nickie Retort, MD;  Location: [facility] ORS;  Service: Urology;  Laterality: Left;     Current Outpatient Rx  Name  Route  Sig  Dispense  Refill  . citalopram (CELEXA) 20 MG tablet   Oral   Take 20 mg by mouth daily.         . clonazePAM (KLONOPIN) 0.5 MG tablet   Oral   Take 0.5 mg by mouth 2 (two) times daily as needed for anxiety. Reported on 05/18/2015         . dicyclomine (BENTYL) 10 MG capsule   Oral   Take 10 mg by mouth 3 (three) times daily as needed.          . empagliflozin (JARDIANCE) 10 MG TABS tablet   Oral   Take by mouth.         Marland Kitchen HYDROcodone-acetaminophen (NORCO/VICODIN) 5-325 MG tablet      TAKE 1-2 TABLETS BY MOPUTH EVERY 4 HOURS AS NEEDED FOR PAIN   30 tablet   0   . hydrocortisone (ANUSOL-HC) 25 MG suppository   Rectal   Place rectally.         Marland Kitchen  loratadine (CLARITIN) 10 MG tablet   Oral   Take 10 mg by mouth daily. Reported on 05/18/2015         . meloxicam (MOBIC) 15 MG tablet      TAKE 1 TABLET (15 MG TOTAL) BY MOUTH ONCE DAILY.      2   . metFORMIN (GLUMETZA) 500 MG (MOD) 24 hr tablet   Oral   Take by mouth.         . Multiple Vitamin (MULTIVITAMIN) capsule   Oral   Take 1 capsule by mouth daily. Reported on 05/18/2015         . nortriptyline (PAMELOR) 50 MG capsule   Oral   Take by mouth.         Marland Kitchen omeprazole (PRILOSEC) 40 MG capsule   Oral   Take 40 mg by mouth daily.         Marland Kitchen oxyCODONE-acetaminophen (PERCOCET) 10-325 MG per tablet   Oral   Take 1 tablet by mouth every 4 (four) hours as needed for pain.         . pregabalin (LYRICA) 150 MG capsule   Oral   Take 150 mg by  mouth at bedtime.          . pregabalin (LYRICA) 50 MG capsule   Oral   Take 50 mg by mouth every morning.          . sucralfate (CARAFATE) 1 G tablet   Oral   Take 1 g by mouth 4 (four) times daily -  with meals and at bedtime. Reported on 05/18/2015         . tamsulosin (FLOMAX) 0.4 MG CAPS capsule   Oral   Take 0.4 mg by mouth.         . terbinafine (LAMISIL) 250 MG tablet   Oral   Take 250 mg by mouth daily.         Marland Kitchen tiZANidine (ZANAFLEX) 4 MG capsule      TAKE 1-2 CAPS THREE (3) TIMES DAILY      3   . traMADol (ULTRAM) 50 MG tablet   Oral   Take 50 mg by mouth every 6 (six) hours as needed. for pain      1      Allergies Floxin and Hydromorphone   Family History  Problem Relation Age of Onset  . Diabetes Mother   . Hypertension Mother   . Asthma Mother   . Heart disease Father   . Kidney disease Father   . Diabetes Father   . Stroke Father     Social History Social History  Substance Use Topics  . Smoking status: Never Smoker   . Smokeless tobacco: Never Used  . Alcohol Use: No    Review of Systems  Constitutional:   Positive fever. No weight changes. Eyes, no blurry vision or double vision..  ENT:   No sore throat. Cardiovascular:   No chest pain. Respiratory:   No dyspnea or cough. Gastrointestinal:   Negative for abdominal pain, vomiting and diarrhea.  No BRBPR or melena. Genitourinary:   Positive dysuria and urgency and frequency. No retention. Musculoskeletal:   Positive left flank pain. Skin:   Negative for rash. Neurological:   Negative for headaches, focal weakness or numbness. Psychiatric:  No anxiety or depression.   Endocrine:  No hot/cold intolerance, changes in energy, or sleep difficulty.  10-point ROS otherwise negative.  ____________________________________________   PHYSICAL EXAM:  VITAL SIGNS: ED Triage Vitals  Enc Vitals Group     BP 05/27/15 1131 159/85 mmHg     Pulse Rate 05/27/15 1131 84     Resp  05/27/15 1131 20     Temp 05/27/15 1131 99.8 F (37.7 C)     Temp Source 05/27/15 1131 Tympanic     SpO2 05/27/15 1131 95 %     Weight 05/27/15 1131 260 lb (117.935 kg)     Height 05/27/15 1131 6\' 2"  (1.88 m)     Head Cir --      Peak Flow --      Pain Score 05/27/15 1131 8     Pain Loc --      Pain Edu? --      Excl. in [place]? --     Vital signs reviewed, nursing assessments reviewed.   Constitutional:   Alert and oriented. Well appearing and in mild distress due to pain Eyes:   No scleral icterus. No conjunctival pallor. PERRL. EOMI ENT   Head:   Normocephalic and atraumatic.   Nose:   No congestion/rhinnorhea. No septal hematoma   Mouth/Throat:   MMM, no pharyngeal erythema. No peritonsillar mass. No uvula shift.   Neck:   No stridor. No SubQ emphysema. No meningismus. Hematological/Lymphatic/Immunilogical:   No cervical lymphadenopathy. Cardiovascular:   RRR. Normal and symmetric distal pulses are present in all extremities. No murmurs, rubs, or gallops. Respiratory:   Normal respiratory effort without tachypnea nor retractions. Breath sounds are clear and equal bilaterally. No wheezes/rales/rhonchi. Gastrointestinal:   Soft with left-sided tenderness. Significant suprapubic tenderness. No distention. There is no CVA tenderness.  No rebound, rigidity, or guarding. Genitourinary:   deferred Musculoskeletal:   Nontender with normal range of motion in all extremities. No joint effusions.  No lower extremity tenderness.  No edema. Neurologic:   Normal speech and language.  CN 2-10 normal. Motor grossly intact. No pronator drift.  Normal gait. No gross focal neurologic deficits are appreciated.  Skin:    Skin is warm, dry and intact. No rash noted.  No petechiae, purpura, or bullae. Psychiatric:   Mood and affect are normal. Speech and behavior are normal. Patient exhibits appropriate insight and judgment.  ____________________________________________    LABS  (pertinent positives/negatives) (all labs ordered are listed, but only abnormal results are displayed) Labs Reviewed  BASIC METABOLIC PANEL - Abnormal; Notable for the following:    Sodium 132 (*)    Chloride 96 (*)    Glucose, Bld 284 (*)    BUN 21 (*)    Creatinine, Ser 1.62 (*)    GFR calc non Af Amer 47 (*)    GFR calc Af Amer 55 (*)    All other components within normal limits  CBC - Abnormal; Notable for the following:    WBC 15.7 (*)    Platelets 145 (*)    All other components within normal limits  URINALYSIS COMPLETEWITH MICROSCOPIC ([facility] ONLY) - Abnormal; Notable for the following:    Color, Urine YELLOW (*)    APPearance CLEAR (*)    Glucose, UA >500 (*)    Hgb urine dipstick 3+ (*)    Protein, ur 30 (*)    Leukocytes, UA 1+ (*)    All other components within normal limits  URINE CULTURE   ____________________________________________   EKG  Interpreted by me  Date: 05/27/2015  Rate: 80  Rhythm: normal sinus rhythm  QRS Axis: normal  Intervals: normal  ST/T Wave abnormalities: normal  Conduction Disutrbances: none  Narrative  Interpretation: unremarkable      ____________________________________________    RADIOLOGY  CT abdomen and pelvis shows left ureteral stent in good position without hydronephrosis. No renal complications. No significant findings.  ____________________________________________   PROCEDURES   ____________________________________________   INITIAL IMPRESSION / ASSESSMENT AND PLAN / ED COURSE  Pertinent labs & imaging results that were available during my care of the patient were reviewed by me and considered in my medical decision making (see chart for details).  Patient presents with signs and symptoms of urinary tract infection. Single kidney, but febrile. We'll check labs and CT.    ----------------------------------------- 3:56 PM on 05/27/2015 -----------------------------------------  CT does not show any  application of the kidney or ureteral stent. Case discussed with urology who recommends inpatient treatment with IV fluids and ceftriaxone until urine cultures can be resulted. Case discussed with the hospitalist for admission.     ____________________________________________   FINAL CLINICAL IMPRESSION(S) / ED DIAGNOSES  Final diagnoses:  Pyelonephritis  Single kidney      Carrie Mew, MD 05/27/15 1557

## 2015-05-27 NOTE — Progress Notes (Signed)
05/27/2015 12:09 PM   Anthony Fuller 1960/12/16 [id number]  Referring provider: Valera Castle, MD [address] [place], [place] [address]  Chief Complaint  Patient presents with  . Dysuria    HPI: The patient is a 55 year old gentleman with a past history significant for right nephrectomy who presents s/p left URS with laser lithotripsy and stent placement on 05/18/15 by Dr. Pilar Jarvis with symptoms of fever, chills, nausea and irritative voiding symptoms worsening over the last few days.  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 He had negative margins and no lymphovascular invasion.  PMH: Past Medical History  Diagnosis Date  . Arthritis   . GERD (gastroesophageal reflux disease)   . Hypertension   . Diabetes mellitus without complication ([place])   . Cancer ([place]) 2012    R kidney removed  . 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  Marland Kitchen Anxiety     Surgical History: Past Surgical History  Procedure Laterality Date  . Spine surgery      lumbar 2013  . Kidney removed Right     2012  . Back surgery    . Nephrectomy Right 04/2011  . Ureteroscopy with holmium laser lithotripsy Left 05/18/2015    Procedure: URETEROSCOPY WITH HOLMIUM LASER LITHOTRIPSY;  Surgeon: Nickie Retort, MD;  Location: [facility] OR[facility];  Service: Urology;  Laterality: Left;  . Cystoscopy w/ retrogrades Left 05/18/2015    Procedure: CYSTOSCOPY WITH RETROGRADE PYELOGRAM;  Surgeon: Nickie Retort, MD;  Location: [facility];  Service: Urology;  Laterality: Left;  . Cystoscopy with stent placement Left 05/18/2015    Procedure: CYSTOSCOPY WITH STENT PLACEMENT;  Surgeon: Nickie Retort, MD;  Location: [facility];  Service: Urology;  Laterality: Left;    Home Medications:    Medication List       This list is accurate as of: 05/27/15 11:56 AM.  Always use your most recent med list.               citalopram 20 MG tablet  Commonly known as:  CELEXA  Take 20  mg by mouth daily.     clonazePAM 0.5 MG tablet  Commonly known as:  KLONOPIN  Take 0.5 mg by mouth 2 (two) times daily as needed for anxiety. Reported on 05/18/2015     dicyclomine 10 MG capsule  Commonly known as:  BENTYL  Take 10 mg by mouth 3 (three) times daily as needed.     HYDROcodone-acetaminophen 5-325 MG tablet  Commonly known as:  NORCO/VICODIN  TAKE 1-2 TABLETS BY MOPUTH EVERY 4 HOURS AS NEEDED FOR PAIN     hydrocortisone 25 MG suppository  Commonly known as:  ANUSOL-HC  Place rectally.     JARDIANCE 10 MG Tabs tablet  Generic drug:  empagliflozin  Take by mouth.     loratadine 10 MG tablet  Commonly known as:  CLARITIN  Take 10 mg by mouth daily. Reported on 05/18/2015     meloxicam 15 MG tablet  Commonly known as:  MOBIC  TAKE 1 TABLET (15 MG TOTAL) BY MOUTH ONCE DAILY.     metFORMIN 500 MG (MOD) 24 hr tablet  Commonly known as:  GLUMETZA  Take by mouth.     methylPREDNISolone 4 MG Tbpk tablet  Commonly known as:  MEDROL DOSEPAK  Take per package instructions     multivitamin capsule  Take 1 capsule by mouth daily. Reported on 05/18/2015     nortriptyline  50 MG capsule  Commonly known as:  PAMELOR  Take by mouth.     omeprazole 40 MG capsule  Commonly known as:  PRILOSEC  Take 40 mg by mouth daily.     ONETOUCH VERIO test strip  Generic drug:  glucose blood  2 (two) times daily. for testing     oxyCODONE-acetaminophen 10-325 MG tablet  Commonly known as:  PERCOCET  Take 1 tablet by mouth every 4 (four) hours as needed for pain.     pregabalin 150 MG capsule  Commonly known as:  LYRICA  Take 150 mg by mouth at bedtime.     pregabalin 50 MG capsule  Commonly known as:  LYRICA  Take 50 mg by mouth every morning.     sucralfate 1 g tablet  Commonly known as:  CARAFATE  Take 1 g by mouth 4 (four) times daily -  with meals and at bedtime. Reported on 05/18/2015     tamsulosin 0.4 MG Caps capsule  Commonly known as:  FLOMAX  Take 0.4 mg by mouth.      terbinafine 250 MG tablet  Commonly known as:  LAMISIL  Take 250 mg by mouth daily.     tiZANidine 4 MG capsule  Commonly known as:  ZANAFLEX  TAKE 1-2 CAPS THREE (3) TIMES DAILY     traMADol 50 MG tablet  Commonly known as:  ULTRAM  Take 50 mg by mouth every 6 (six) hours as needed. for pain        Allergies:  Allergies  Allergen Reactions  . Floxin [Ofloxacin]   . Hydromorphone     Family History: Family History  Problem Relation Age of Onset  . Diabetes Mother   . Hypertension Mother   . Asthma Mother   . Heart disease Father   . Kidney disease Father   . Diabetes Father   . Stroke Father     Social History:  reports that he has never smoked. He has never used smokeless tobacco. He reports that he does not drink alcohol or use illicit drugs.  ROS: UROLOGY Frequent Urination?: Yes Hard to postpone urination?: No Burning/pain with urination?: Yes Get up at night to urinate?: Yes Leakage of urine?: Yes Urine stream starts and stops?: No Trouble starting stream?: No Do you have to strain to urinate?: No Blood in urine?: Yes Urinary tract infection?: No Sexually transmitted disease?: No Injury to kidneys or bladder?: No Painful intercourse?: No Weak stream?: No Erection problems?: No Penile pain?: No  Gastrointestinal Nausea?: Yes Vomiting?: No Indigestion/heartburn?: No Diarrhea?: No Constipation?: No  Constitutional Fever: No Night sweats?: No Weight loss?: No Fatigue?: Yes  Skin Skin rash/lesions?: No Itching?: No  Eyes Blurred vision?: No Double vision?: No  Ears/Nose/Throat Sore throat?: No Sinus problems?: No  Hematologic/Lymphatic Swollen glands?: No Easy bruising?: No  Cardiovascular Leg swelling?: Yes Chest pain?: No  Respiratory Cough?: No Shortness of breath?: Yes  Endocrine Excessive thirst?: No  Musculoskeletal Back pain?: Yes Joint pain?: Yes  Neurological Headaches?: Yes Dizziness?:  Yes  Psychologic Depression?: Yes Anxiety?: Yes  Physical Exam: BP 144/89 mmHg  Pulse 82  Temp(Src) 100.2 F (37.9 C)  Resp 16  Constitutional:  Alert and oriented, appears moderately distressed. HEENT: [place] AT, moist mucus membranes.  Trachea midline, no masses. Cardiovascular: No clubbing, cyanosis, or edema. Respiratory: Normal respiratory effort, no increased work of breathing. GI: Abdomen is soft, mild upper left quadrant tenderness, nondistended, no abdominal masses GU: Left CVA tenderness.  Skin: No  rashes, bruises or suspicious lesions. Neurologic: Grossly intact, no focal deficits, moving all 4 extremities. Psychiatric: Normal mood and affect.  Laboratory Data:   Urinalysis    Component Value Date/Time   COLORURINE YELLOW* 05/27/2015 1135   COLORURINE Yellow 05/31/2014 0637   APPEARANCEUR CLEAR* 05/27/2015 1135   APPEARANCEUR Turbid 05/31/2014 0637   LABSPEC 1.024 05/27/2015 1135   LABSPEC 1.029 05/31/2014 0637   PHURINE 6.0 05/27/2015 1135   PHURINE 5.0 05/31/2014 0637   GLUCOSEU >500* 05/27/2015 1135   GLUCOSEU 50 mg/dL 05/31/2014 0637   HGBUR 3+* 05/27/2015 1135   HGBUR Negative 05/31/2014 0637   BILIRUBINUR NEGATIVE 05/27/2015 1135   BILIRUBINUR Negative 05/05/2015 1429   BILIRUBINUR Negative 05/31/2014 [place] 05/27/2015 1135   KETONESUR Negative 05/31/2014 0637   PROTEINUR 30* 05/27/2015 1135   PROTEINUR 30 mg/dL 05/31/2014 0637   NITRITE NEGATIVE 05/27/2015 1135   NITRITE Negative 05/05/2015 1429   NITRITE Negative 05/31/2014 0637   LEUKOCYTESUR 1+* 05/27/2015 1135   LEUKOCYTESUR Negative 05/05/2015 1429   LEUKOCYTESUR Negative 05/31/2014 0637    Pertinent Imaging:  Assessment & Plan:   1. Post operative infection- Status post left URS with laser lithotripsy and ureteral stent placement. Patient has a solitary kidney status post right nephrectomy. Patient referred to the ED for further evaluation with blood work and imaging.  ED RN notified and expecting patient arrival. - Urinalysis, Complete  2. Renal Cell Carcinoma-  S/p right nephrectomy.  3. H/o C. Difficile infection-  Patient reports multiple previous episodes of C. difficile infection.  No Follow-up on file.  These notes generated with voice recognition software. I apologize for typographical errors.  Herbert Moors, [facility] [address], [place], [place] [address] [phone]

## 2015-05-27 NOTE — H&P (Signed)
Anthony Fuller NAME: Anthony Fuller    MR#:  [id number]  DATE OF BIRTH:  06/26/1960  DATE OF ADMISSION:  05/27/2015  PRIMARY CARE PHYSICIAN: Valera Castle, MD   REQUESTING/REFERRING PHYSICIAN: [place]  CHIEF COMPLAINT:   Chief Complaint  Patient presents with  . Fever  . Flank Pain    HISTORY OF PRESENT ILLNESS: Anthony Fuller  is a 55 y.o. male with a known history of arthritis, gastric reflux, hypertension, diabetes, right-sided kidney renal cell carcinoma status post nephrectomy, left side renal stone and lithotripsy and the ureteral stent done last week by urology surgery. For last 3-4 days has been having pain in his left side abdomen and flank and also having some fever and chills with feeling of nausea but no vomiting. Today he had a regular follow-up with urology clinic after having procedure last week, when he talked to the PDA over there about his symptoms days decided to send him to emergency room right away because he only has one kidney and he looked like having urinary infection. He was tested positive for urinalysis and so started on antibiotic and given his admission to hospitalist team. On top of this patient also complained of pain in his left side of chest which is spasm-like and going constantly for last 1-2 hours, denies any association with change in the position or deep breathing.  PAST MEDICAL HISTORY:   Past Medical History  Diagnosis Date  . Arthritis   . GERD (gastroesophageal reflux disease)   . Hypertension   . Diabetes mellitus without complication ([place])   . Cancer ([place]) 2012    R kidney removed  . 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  Marland Kitchen Anxiety     PAST SURGICAL HISTORY: Past Surgical History  Procedure Laterality Date  . Spine surgery      lumbar 2013  . Kidney removed Right     2012  . Back surgery    . Nephrectomy Right 04/2011  . Ureteroscopy with holmium  laser lithotripsy Left 05/18/2015    Procedure: URETEROSCOPY WITH HOLMIUM LASER LITHOTRIPSY;  Surgeon: Nickie Retort, MD;  Location: [facility];  Service: Urology;  Laterality: Left;  . Cystoscopy w/ retrogrades Left 05/18/2015    Procedure: CYSTOSCOPY WITH RETROGRADE PYELOGRAM;  Surgeon: Nickie Retort, MD;  Location: [facility];  Service: Urology;  Laterality: Left;  . Cystoscopy with stent placement Left 05/18/2015    Procedure: CYSTOSCOPY WITH STENT PLACEMENT;  Surgeon: Nickie Retort, MD;  Location: [facility];  Service: Urology;  Laterality: Left;    SOCIAL HISTORY:  Social History  Substance Use Topics  . Smoking status: Never Smoker   . Smokeless tobacco: Never Used  . Alcohol Use: No    FAMILY HISTORY:  Family History  Problem Relation Age of Onset  . Diabetes Mother   . Hypertension Mother   . Asthma Mother   . Heart disease Father   . Kidney disease Father   . Diabetes Father   . Stroke Father     DRUG ALLERGIES:  Allergies  Allergen Reactions  . Hydromorphone Other (See Comments)    Reaction:  Drops pts BP   . Floxin [Ofloxacin] Rash    REVIEW OF SYSTEMS:   CONSTITUTIONAL: No fever, fatigue or weakness.  EYES: No blurred or double vision.  EARS, NOSE, AND THROAT: No tinnitus or ear pain.  RESPIRATORY: No cough, shortness of breath, wheezing  or hemoptysis.  CARDIOVASCULAR: Positive left-sided chest pain, no orthopnea, edema.  GASTROINTESTINAL: No nausea, vomiting, diarrhea , left side abdominal pain.  GENITOURINARY: No dysuria, mild hematuria since having the stent.  ENDOCRINE: No polyuria, nocturia,  HEMATOLOGY: No anemia, easy bruising or bleeding SKIN: No rash or lesion. MUSCULOSKELETAL: No joint pain or arthritis.   NEUROLOGIC: No tingling, numbness, weakness.  PSYCHIATRY: No anxiety or depression.   MEDICATIONS AT HOME:  Prior to Admission medications   Medication Sig Start Date End Date Taking? Authorizing Provider  citalopram (CELEXA) 20 MG  tablet Take 20 mg by mouth at bedtime.    Yes Historical Provider, MD  dicyclomine (BENTYL) 10 MG capsule Take 10 mg by mouth 3 (three) times daily as needed for spasms.    Yes Historical Provider, MD  empagliflozin (JARDIANCE) 10 MG TABS tablet Take 10 mg by mouth daily.    Yes Historical Provider, MD  HYDROcodone-acetaminophen (NORCO/VICODIN) 5-325 MG tablet Take 1-2 tablets by mouth every 4 (four) hours as needed for moderate pain.   Yes Historical Provider, MD  loratadine (CLARITIN) 10 MG tablet Take 10 mg by mouth daily as needed for allergies.    Yes Historical Provider, MD  metFORMIN (GLUCOPHAGE-XR) 500 MG 24 hr tablet Take 500 mg by mouth at bedtime.   Yes Historical Provider, MD  nortriptyline (PAMELOR) 50 MG capsule Take 50 mg by mouth at bedtime.    Yes Historical Provider, MD  omeprazole (PRILOSEC) 40 MG capsule Take 40 mg by mouth at bedtime.    Yes Historical Provider, MD  pregabalin (LYRICA) 150 MG capsule Take 150 mg by mouth at bedtime.    Yes Historical Provider, MD  pregabalin (LYRICA) 50 MG capsule Take 50 mg by mouth every morning.    Yes Historical Provider, MD  tamsulosin (FLOMAX) 0.4 MG CAPS capsule Take 0.4 mg by mouth at bedtime.    Yes Historical Provider, MD  terbinafine (LAMISIL) 250 MG tablet Take 250 mg by mouth at bedtime.    Yes Historical Provider, MD  tiZANidine (ZANAFLEX) 4 MG tablet Take 4-8 mg by mouth 3 (three) times daily as needed for muscle spasms.   Yes Historical Provider, MD  traMADol (ULTRAM) 50 MG tablet Take 50 mg by mouth every 6 (six) hours as needed for moderate pain. for pain   Yes Historical Provider, MD      PHYSICAL EXAMINATION:   VITAL SIGNS: Blood pressure 159/94, pulse 80, temperature 99.8 F (37.7 C), temperature source Tympanic, resp. rate 17, height 6\' 2"  (1.88 m), weight 117.935 kg (260 lb), SpO2 98 %.  GENERAL:  55 y.o.-year-old patient lying in the bed with no acute distress.  EYES: Pupils equal, round, reactive to light and  accommodation. No scleral icterus. Extraocular muscles intact.  HEENT: Head atraumatic, normocephalic. Oropharynx and nasopharynx clear.  NECK:  Supple, no jugular venous distention. No thyroid enlargement, no tenderness.  LUNGS: Normal breath sounds bilaterally, no wheezing, rales,rhonchi or crepitation. No use of accessory muscles of respiration.  CARDIOVASCULAR: S1, S2 normal. No murmurs, rubs, or gallops.  ABDOMEN: Soft, mild left-sided costovertebral angle tenderness., nondistended. Bowel sounds present. No organomegaly or mass.  EXTREMITIES: No pedal edema, cyanosis, or clubbing.  NEUROLOGIC: Cranial nerves II through XII are intact. Muscle strength 5/5 in all extremities. Sensation intact. Gait not checked.  PSYCHIATRIC: The patient is alert and oriented x 3.  SKIN: No obvious rash, lesion, or ulcer.   LABORATORY PANEL:   CBC  Recent Labs Lab 05/27/15 1135  WBC  15.7*  HGB 15.8  HCT 47.8  PLT 145*  MCV 87.7  MCH 28.9  MCHC 33.0  RDW 13.7   ------------------------------------------------------------------------------------------------------------------  Chemistries   Recent Labs Lab 05/27/15 1135  NA 132*  K 4.2  CL 96*  CO2 24  GLUCOSE 284*  BUN 21*  CREATININE 1.62*  CALCIUM 9.1   ------------------------------------------------------------------------------------------------------------------ estimated creatinine clearance is 72.8 mL/min (by C-G formula based on Cr of 1.62). ------------------------------------------------------------------------------------------------------------------ No results for input(s): TSH, T4TOTAL, T3FREE, THYROIDAB in the last 72 hours.  Invalid input(s): FREET3   Coagulation profile No results for input(s): INR, PROTIME in the last 168 hours. ------------------------------------------------------------------------------------------------------------------- No results for input(s): DDIMER in the last 72  hours. -------------------------------------------------------------------------------------------------------------------  Cardiac Enzymes No results for input(s): CKMB, TROPONINI, MYOGLOBIN in the last 168 hours.  Invalid input(s): CK ------------------------------------------------------------------------------------------------------------------ Invalid input(s): POCBNP  ---------------------------------------------------------------------------------------------------------------  Urinalysis    Component Value Date/Time   COLORURINE YELLOW* 05/27/2015 1135   COLORURINE Yellow 05/31/2014 0637   APPEARANCEUR CLEAR* 05/27/2015 1135   APPEARANCEUR Turbid 05/31/2014 0637   LABSPEC 1.024 05/27/2015 1135   LABSPEC 1.029 05/31/2014 0637   PHURINE 6.0 05/27/2015 1135   PHURINE 5.0 05/31/2014 0637   GLUCOSEU >500* 05/27/2015 1135   GLUCOSEU 50 mg/dL 05/31/2014 0637   HGBUR 3+* 05/27/2015 1135   HGBUR Negative 05/31/2014 0637   BILIRUBINUR NEGATIVE 05/27/2015 1135   BILIRUBINUR Negative 05/27/2015 1049   BILIRUBINUR Negative 05/31/2014 [place] 05/27/2015 1135   KETONESUR Negative 05/31/2014 0637   PROTEINUR 30* 05/27/2015 1135   PROTEINUR 30 mg/dL 05/31/2014 0637   NITRITE NEGATIVE 05/27/2015 1135   NITRITE Negative 05/27/2015 1049   NITRITE Negative 05/31/2014 0637   LEUKOCYTESUR 1+* 05/27/2015 1135   LEUKOCYTESUR 1+* 05/27/2015 1049   LEUKOCYTESUR Negative 05/31/2014 0637     RADIOLOGY: Ct Renal Stone Study  05/27/2015  CLINICAL DATA:  [place] this past week and and landed on back. Left flank pain for several days with nausea. History of a left double-J ureteral stent placed 1 week ago for kidney stones. History of right nephrectomy for renal cell cancer. EXAM: CT ABDOMEN AND PELVIS WITHOUT CONTRAST TECHNIQUE: Multidetector CT imaging of the abdomen and pelvis was performed following the standard protocol without IV contrast. COMPARISON:  CT scan 04/25/2015  FINDINGS: Lower chest: The lung bases are clear. No pulmonary contusion, pleural effusion or pneumothorax. Minimal lingular atelectasis. The heart is normal in size. No pericardial effusion. The distal esophagus is grossly normal. Hepatobiliary: No focal hepatic lesions or acute hepatic injury. The gallbladder is grossly normal. No common bile duct dilatation. Pancreas: No mass, inflammation or ductal dilatation. Spleen: Normal size.  No focal lesions or acute injury. Adrenals/Urinary Tract: The adrenal glands are normal. Status post right nephrectomy. The left kidney contains a double-J ureteral stent which appears to be in good position without complicating features. Small mid and lower pole renal calculi are noted. No definite calculi are seen along the course of the left ureteral stent. No bladder calculi. No left-sided hydronephrosis. Mild perinephric interstitial changes are noted. Stomach/Bowel: The stomach, duodenum, small bowel and colon are grossly normal without oral contrast. No inflammatory changes, mass lesions or obstructive findings. The terminal ileum is normal. The appendix is normal. Vascular/Lymphatic: The aorta is normal in caliber. No atherosclerotic calcifications. No mesenteric or retroperitoneal mass or adenopathy. Other: The bladder, prostate gland and seminal vesicles are unremarkable. A right inguinal hernia is noted. Prominent vessels in the left inguinal canal could be due to a varicocele.  Musculoskeletal: No significant bony findings. No acute rib, spine or transverse process fractures are identified. The bony pelvis is intact. IMPRESSION: 1. Left-sided double-J ureteral stent in good position without complicating features. No hydronephrosis. 2. Left-sided renal calculi. No definite ureteral or bladder calculi. 3. Status post right nephrectomy for renal cell cancer. 4. No significant bony findings. Electronically Signed   By: Marijo Sanes M.D.   On: 05/27/2015 14:20    EKG: Orders  placed or performed during the hospital encounter of 05/11/15  . EKG 12 lead  . EKG 12 lead    IMPRESSION AND PLAN:  * UTI  The patient had fever and chills at home and slight elevated temperature here, elevated white cell count.  Recent urological study done , ER physician spoke to urologist and they suggested treatment medically.   IV Rocephin for now and follow urine culture.  * Mild acute kidney injury  Slight worsening in renal function compared to baseline.  Continue IV fluid and follow renal function tomorrow.  Hold metformin for now.  *  Diabetes  Continue home medication except metformin, keep on insulin sliding scale coverage.  * Chest pain on left side  Most likely these is extension of his left-sided abdominal pain.  We will do cardiac monitoring and follow serial troponin. Check lipid panel.  * Hypertension  Blood pressure is slightly elevated  I would like to monitor as he has only one kidney and presented with slight worsening in renal function so would allow blood pressure to run normal or slight higher.   All the records are reviewed and case discussed with ED provider. Management plans discussed with the patient, family and they are in agreement.  CODE STATUS: Code Status History    This patient does not have a recorded code status. Please follow your organizational policy for patients in this situation.       TOTAL TIME TAKING CARE OF THIS PATIENT: 50 minutes.    Vaughan Basta M.D on 05/27/2015   Between 7am to 6pm - Pager - [phone]  After 6pm go to [email or web address] - password [email or web address] [facility]  Office  [phone]  CC: Primary care physician; Valera Castle, MD   Note: This dictation was prepared with Dragon dictation along with smaller phrase technology. Any transcriptional errors that result from this process are unintentional.

## 2015-05-27 NOTE — Progress Notes (Signed)
Received orders from md to give norco additional dose x 1 for re-occuring chest pain.

## 2015-05-28 LAB — GLUCOSE, CAPILLARY
Glucose-Capillary: 128 mg/dL — ABNORMAL HIGH (ref 65–99)
Glucose-Capillary: 173 mg/dL — ABNORMAL HIGH (ref 65–99)
Glucose-Capillary: 183 mg/dL — ABNORMAL HIGH (ref 65–99)
Glucose-Capillary: 143 mg/dL — ABNORMAL HIGH (ref 65–99)

## 2015-05-28 LAB — LIPID PANEL
Cholesterol: 175 mg/dL (ref 0–200)
HDL: 43 mg/dL (ref >40)
LDL Cholesterol: 88 mg/dL (ref 0–99)
Total CHOL/HDL Ratio: 4.1 RATIO
Triglycerides: 220 mg/dL — ABNORMAL HIGH (ref <150)
VLDL: 44 mg/dL — ABNORMAL HIGH (ref 0–40)

## 2015-05-28 LAB — CBC
HCT: 42 % (ref 40.0–52.0)
Hemoglobin: 14 g/dL (ref 13.0–18.0)
MCH: 29.1 pg (ref 26.0–34.0)
MCHC: 33.3 g/dL (ref 32.0–36.0)
MCV: 87.4 fL (ref 80.0–100.0)
Platelets: 123 10*3/uL — ABNORMAL LOW (ref 150–440)
RBC: 4.81 MIL/uL (ref 4.40–5.90)
RDW: 13.7 % (ref 11.5–14.5)
WBC: 13 10*3/uL — ABNORMAL HIGH (ref 3.8–10.6)

## 2015-05-28 LAB — BASIC METABOLIC PANEL
Anion gap: 5 (ref 5–15)
BUN: 17 mg/dL (ref 6–20)
CO2: 27 mmol/L (ref 22–32)
Calcium: 8.2 mg/dL — ABNORMAL LOW (ref 8.9–10.3)
Chloride: 102 mmol/L (ref 101–111)
Creatinine, Ser: 1.29 mg/dL — ABNORMAL HIGH (ref 0.61–1.24)
GFR calc Af Amer: >60 mL/min (ref >60)
GFR calc non Af Amer: >60 mL/min (ref >60)
Glucose, Bld: 177 mg/dL — ABNORMAL HIGH (ref 65–99)
Potassium: 3.5 mmol/L (ref 3.5–5.1)
Sodium: 134 mmol/L — ABNORMAL LOW (ref 135–145)

## 2015-05-28 LAB — TROPONIN I: Troponin I: <0.03 ng/mL (ref <0.031)

## 2015-05-28 LAB — HEMOGLOBIN A1C: Hgb A1c MFr Bld: 7.4 % — ABNORMAL HIGH (ref 4.0–6.0)

## 2015-05-28 MED ORDER — POTASSIUM CHLORIDE IN NACL 20-0.9 MEQ/L-% IV SOLN
INTRAVENOUS | Status: DC
  Administered 2015-05-28 – 2015-05-29 (×2): via INTRAVENOUS
  Filled 2015-05-28 (×3): qty 1000

## 2015-05-28 NOTE — Progress Notes (Signed)
[place] at [place] NAME: Anthony Fuller    MR#:  [id number]  DATE OF BIRTH:  1961-02-04  SUBJECTIVE:  CHIEF COMPLAINT:   Chief Complaint  Patient presents with  . Fever  . Flank Pain  patient is 55 year old male with PMH of arthritis, gastric reflux, hypertension, diabetes, right-sided kidney renal cell carcinoma status post nephrectomy, left side renal stone and lithotripsy and the ureteral stent done last week by urology who came in to the hospital because of 3-4 days history of pain in his left side abdomen and flank and also some fever and chills with feeling of nausea but no vomiting. Because of concern of acute pyelonephritis patient was sent from urology to ER.  He tested positive for pyuria and was started on antibiotic and admitted.  On top of this patient also complained of pain in his left side of chest which is spasm-like and going constantly for last 1-2 hours, denies any association with change in the position or deep breathing. Patient feels better today, still has pain in L flank, not in chest. T max 100.2  Review of Systems  Constitutional: Negative for fever, chills and weight loss.  HENT: Negative for congestion.   Eyes: Negative for blurred vision and double vision.  Respiratory: Negative for cough, sputum production, shortness of breath and wheezing.   Cardiovascular: Negative for chest pain, palpitations, orthopnea, leg swelling and PND.  Gastrointestinal: Negative for nausea, vomiting, abdominal pain, diarrhea, constipation and blood in stool.  Genitourinary: Positive for dysuria and flank pain. Negative for urgency, frequency and hematuria.  Musculoskeletal: Negative for falls.  Neurological: Negative for dizziness, tremors, focal weakness and headaches.  Endo/Heme/Allergies: Does not bruise/bleed easily.  Psychiatric/Behavioral: Negative for depression. The patient does not have insomnia.     VITAL SIGNS:  Blood pressure 115/79, pulse 67, temperature 98.6 F (37 C), temperature source Oral, resp. rate 17, height 6\' 2"  (1.88 m), weight 117.935 kg (260 lb), SpO2 97 %.  PHYSICAL EXAMINATION:   GENERAL:  55 y.o.-year-old patient lying in the bed in moderate  Distress, wet towel on the forehead.  EYES: Pupils equal, round, reactive to light and accommodation. No scleral icterus. Extraocular muscles intact.  HEENT: Head atraumatic, normocephalic. Oropharynx and nasopharynx clear.  NECK:  Supple, no jugular venous distention. No thyroid enlargement, no tenderness.  LUNGS: Normal breath sounds bilaterally, no wheezing, rales,rhonchi or crepitation. No use of accessory muscles of respiration.  CARDIOVASCULAR: S1, S2 normal. No murmurs, rubs, or gallops.  ABDOMEN: Soft, some tenderness in L flank, L side of abdomen, no rebound, no massess, nondistended. Bowel sounds present. No organomegaly or mass. No CVA tenderness EXTREMITIES: No pedal edema, cyanosis, or clubbing.  NEUROLOGIC: Cranial nerves II through XII are intact. Muscle strength 5/5 in all extremities. Sensation intact. Gait not checked.  PSYCHIATRIC: The patient is alert and oriented x 3.  SKIN: No obvious rash, lesion, or ulcer.   ORDERS/RESULTS REVIEWED:   CBC  Recent Labs Lab 05/27/15 1135 05/28/15 0027  WBC 15.7* 13.0*  HGB 15.8 14.0  HCT 47.8 42.0  PLT 145* 123*  MCV 87.7 87.4  MCH 28.9 29.1  MCHC 33.0 33.3  RDW 13.7 13.7   ------------------------------------------------------------------------------------------------------------------  Chemistries   Recent Labs Lab 05/27/15 1135 05/28/15 0027  NA 132* 134*  K 4.2 3.5  CL 96* 102  CO2 24 27  GLUCOSE 284* 177*  BUN 21* 17  CREATININE 1.62* 1.29*  CALCIUM 9.1 8.2*   ------------------------------------------------------------------------------------------------------------------  estimated creatinine clearance is 91.4 mL/min (by C-G formula based on Cr of  1.29). ------------------------------------------------------------------------------------------------------------------ No results for input(s): TSH, T4TOTAL, T3FREE, THYROIDAB in the last 72 hours.  Invalid input(s): FREET3  Cardiac Enzymes  Recent Labs Lab 05/27/15 1135 05/27/15 1827 05/28/15 0027  TROPONINI 0.03 <0.03 <0.03   ------------------------------------------------------------------------------------------------------------------ Invalid input(s): POCBNP ---------------------------------------------------------------------------------------------------------------  RADIOLOGY: Ct Renal Stone Study  05/27/2015  CLINICAL DATA:  [place] this past week and and landed on back. Left flank pain for several days with nausea. History of a left double-J ureteral stent placed 1 week ago for kidney stones. History of right nephrectomy for renal cell cancer. EXAM: CT ABDOMEN AND PELVIS WITHOUT CONTRAST TECHNIQUE: Multidetector CT imaging of the abdomen and pelvis was performed following the standard protocol without IV contrast. COMPARISON:  CT scan 04/25/2015 FINDINGS: Lower chest: The lung bases are clear. No pulmonary contusion, pleural effusion or pneumothorax. Minimal lingular atelectasis. The heart is normal in size. No pericardial effusion. The distal esophagus is grossly normal. Hepatobiliary: No focal hepatic lesions or acute hepatic injury. The gallbladder is grossly normal. No common bile duct dilatation. Pancreas: No mass, inflammation or ductal dilatation. Spleen: Normal size.  No focal lesions or acute injury. Adrenals/Urinary Tract: The adrenal glands are normal. Status post right nephrectomy. The left kidney contains a double-J ureteral stent which appears to be in good position without complicating features. Small mid and lower pole renal calculi are noted. No definite calculi are seen along the course of the left ureteral stent. No bladder calculi. No left-sided hydronephrosis.  Mild perinephric interstitial changes are noted. Stomach/Bowel: The stomach, duodenum, small bowel and colon are grossly normal without oral contrast. No inflammatory changes, mass lesions or obstructive findings. The terminal ileum is normal. The appendix is normal. Vascular/Lymphatic: The aorta is normal in caliber. No atherosclerotic calcifications. No mesenteric or retroperitoneal mass or adenopathy. Other: The bladder, prostate gland and seminal vesicles are unremarkable. A right inguinal hernia is noted. Prominent vessels in the left inguinal canal could be due to a varicocele. Musculoskeletal: No significant bony findings. No acute rib, spine or transverse process fractures are identified. The bony pelvis is intact. IMPRESSION: 1. Left-sided double-J ureteral stent in good position without complicating features. No hydronephrosis. 2. Left-sided renal calculi. No definite ureteral or bladder calculi. 3. Status post right nephrectomy for renal cell cancer. 4. No significant bony findings. Electronically Signed   By: Marijo Sanes M.D.   On: 05/27/2015 14:20    EKG:  Orders placed or performed during the hospital encounter of 05/11/15  . EKG 12 lead  . EKG 12 lead    ASSESSMENT AND PLAN:  Principal Problem:   UTI (urinary tract infection) Active Problems:   UTI (lower urinary tract infection) 1. Acute pyelonephritis, awaiting for urine cx, continue rocephin, afebrile, white blood cell count is improving, supportive therapy, no hydronephrosis 2. Acute on chronic renal failure, improving on IVF administration 3. Hyponatremia, improving on IVF, follow in am 4. Leukocytosis, improving 5. Nausea, resolved, continue regular diet   Management plans discussed with the patient, family and they are in agreement.   DRUG ALLERGIES:  Allergies  Allergen Reactions  . Hydromorphone Other (See Comments)    Reaction:  Drops pts BP   . Floxin [Ofloxacin] Rash    CODE STATUS:     Code Status  Orders        Start     Ordered   05/27/15 1835  Full code   Continuous  05/27/15 1834    Code Status History    Date Active Date Inactive Code Status Order ID Comments User Context   This patient has a current code status but no historical code status.      TOTAL TIME TAKING CARE OF THIS PATIENT: 40 minutes.    Theodoro Grist M.D on 05/28/2015 at 2:55 PM  Between 7am to 6pm - Pager - [phone]  After 6pm go to [email or web address] - password [email or web address] [facility]  Office  [phone]  CC: Primary care physician; Valera Castle, MD

## 2015-05-29 LAB — GLUCOSE, CAPILLARY
Glucose-Capillary: 218 mg/dL — ABNORMAL HIGH (ref 65–99)
Glucose-Capillary: 196 mg/dL — ABNORMAL HIGH (ref 65–99)
Glucose-Capillary: 127 mg/dL — ABNORMAL HIGH (ref 65–99)
Glucose-Capillary: 124 mg/dL — ABNORMAL HIGH (ref 65–99)

## 2015-05-29 LAB — CBC
HCT: 38.4 % — ABNORMAL LOW (ref 40.0–52.0)
Hemoglobin: 12.8 g/dL — ABNORMAL LOW (ref 13.0–18.0)
MCH: 29.5 pg (ref 26.0–34.0)
MCHC: 33.4 g/dL (ref 32.0–36.0)
MCV: 88.2 fL (ref 80.0–100.0)
Platelets: 137 10*3/uL — ABNORMAL LOW (ref 150–440)
RBC: 4.35 MIL/uL — ABNORMAL LOW (ref 4.40–5.90)
RDW: 13.6 % (ref 11.5–14.5)
WBC: 8.5 10*3/uL (ref 3.8–10.6)

## 2015-05-29 LAB — BASIC METABOLIC PANEL
Anion gap: 4 — ABNORMAL LOW (ref 5–15)
BUN: 19 mg/dL (ref 6–20)
CO2: 27 mmol/L (ref 22–32)
Calcium: 8.1 mg/dL — ABNORMAL LOW (ref 8.9–10.3)
Chloride: 101 mmol/L (ref 101–111)
Creatinine, Ser: 1.27 mg/dL — ABNORMAL HIGH (ref 0.61–1.24)
GFR calc Af Amer: >60 mL/min (ref >60)
GFR calc non Af Amer: >60 mL/min (ref >60)
Glucose, Bld: 238 mg/dL — ABNORMAL HIGH (ref 65–99)
Potassium: 4.2 mmol/L (ref 3.5–5.1)
Sodium: 132 mmol/L — ABNORMAL LOW (ref 135–145)

## 2015-05-29 MED ORDER — DOCUSATE SODIUM 100 MG PO CAPS
100.0000 mg | ORAL_CAPSULE | Freq: Two times a day (BID) | ORAL | Status: DC
  Administered 2015-05-29 – 2015-05-30 (×3): 100 mg via ORAL
  Filled 2015-05-29 (×3): qty 1

## 2015-05-29 MED ORDER — SENNA 8.6 MG PO TABS
1.0000 | ORAL_TABLET | Freq: Every day | ORAL | Status: DC
  Administered 2015-05-29 – 2015-05-30 (×2): 8.6 mg via ORAL
  Filled 2015-05-29 (×2): qty 1

## 2015-05-29 MED ORDER — AMOXICILLIN-POT CLAVULANATE 875-125 MG PO TABS
1.0000 | ORAL_TABLET | Freq: Two times a day (BID) | ORAL | Status: DC
  Administered 2015-05-29 – 2015-05-30 (×3): 1 via ORAL
  Filled 2015-05-29 (×3): qty 1

## 2015-05-29 MED ORDER — MORPHINE SULFATE (PF) 2 MG/ML IV SOLN
2.0000 mg | INTRAVENOUS | Status: DC | PRN
Start: 2015-05-29 — End: 2015-05-30
  Administered 2015-05-29 – 2015-05-30 (×4): 2 mg via INTRAVENOUS
  Filled 2015-05-29 (×4): qty 1

## 2015-05-29 MED ORDER — ASPIRIN EC 325 MG PO TBEC
325.0000 mg | DELAYED_RELEASE_TABLET | Freq: Every day | ORAL | Status: DC
  Administered 2015-05-30: 325 mg via ORAL
  Filled 2015-05-29: qty 1

## 2015-05-29 MED ORDER — PIPERACILLIN-TAZOBACTAM 3.375 G IVPB 30 MIN
3.3750 g | Freq: Once | INTRAVENOUS | Status: AC
  Administered 2015-05-29: 3.375 g via INTRAVENOUS
  Filled 2015-05-29 (×2): qty 50

## 2015-05-29 NOTE — Progress Notes (Signed)
[place] at [place] NAME: Anthony Fuller    MR#:  [id number]  DATE OF BIRTH:  10-18-1960  SUBJECTIVE:  CHIEF COMPLAINT:   Chief Complaint  Patient presents with  . Fever  . Flank Pain  patient is 55 year old male with PMH of arthritis, gastric reflux, hypertension, diabetes, right-sided kidney renal cell carcinoma status post nephrectomy, left side renal stone and lithotripsy and the ureteral stent done last week by urology who came in to the hospital because of 3-4 days history of pain in his left side abdomen and flank and also some fever and chills with feeling of nausea but no vomiting. Because of concern of acute pyelonephritis patient was sent from urology to ER.  He tested positive for pyuria and was started on antibiotic and admitted.  On arrival to the hospital patient also complained of pain in his left side of chest which is spasm-like and going constantly for last 1-2 hours, denies any association with change in the position or deep breathing. He had recurrence of this chest pain earlier today, helpless [place] Patient feels better today, still has pain in L flank, left abdominal area , left side of chest  T max 98.4. Urinary cultures by microbiology is enterococcus, sensitive to ampicillin, per telephone report  Review of Systems  Constitutional: Negative for fever, chills and weight loss.  HENT: Negative for congestion.   Eyes: Negative for blurred vision and double vision.  Respiratory: Negative for cough, sputum production, shortness of breath and wheezing.   Cardiovascular: Negative for chest pain, palpitations, orthopnea, leg swelling and PND.  Gastrointestinal: Negative for nausea, vomiting, abdominal pain, diarrhea, constipation and blood in stool.  Genitourinary: Positive for dysuria and flank pain. Negative for urgency, frequency and hematuria.  Musculoskeletal: Negative for falls.  Neurological: Negative for dizziness,  tremors, focal weakness and headaches.  Endo/Heme/Allergies: Does not bruise/bleed easily.  Psychiatric/Behavioral: Negative for depression. The patient does not have insomnia.     VITAL SIGNS: Blood pressure 118/75, pulse 69, temperature 98.1 F (36.7 C), temperature source Oral, resp. rate 17, height 6\' 2"  (1.88 m), weight 117.935 kg (260 lb), SpO2 98 %.  PHYSICAL EXAMINATION:   GENERAL:  55 y.o.-year-old patient lying in the bed in moderate  Distress, wet towel on the forehead.  EYES: Pupils equal, round, reactive to light and accommodation. No scleral icterus. Extraocular muscles intact.  HEENT: Head atraumatic, normocephalic. Oropharynx and nasopharynx clear.  NECK:  Supple, no jugular venous distention. No thyroid enlargement, no tenderness.  LUNGS: Normal breath sounds bilaterally, no wheezing, rales,rhonchi or crepitation. No use of accessory muscles of respiration.  CARDIOVASCULAR: S1, S2 normal. No murmurs, rubs, or gallops.  ABDOMEN: Soft, some tenderness in L flank, L side of abdomen, no rebound, no massess, nondistended. Bowel sounds present. No organomegaly or mass. No CVA tenderness EXTREMITIES: No pedal edema, cyanosis, or clubbing.  NEUROLOGIC: Cranial nerves II through XII are intact. Muscle strength 5/5 in all extremities. Sensation intact. Gait not checked.  PSYCHIATRIC: The patient is alert and oriented x 3.  SKIN: No obvious rash, lesion, or ulcer.   ORDERS/RESULTS REVIEWED:   CBC  Recent Labs Lab 05/27/15 1135 05/28/15 0027 05/29/15 0811  WBC 15.7* 13.0* 8.5  HGB 15.8 14.0 12.8*  HCT 47.8 42.0 38.4*  PLT 145* 123* 137*  MCV 87.7 87.4 88.2  MCH 28.9 29.1 29.5  MCHC 33.0 33.3 33.4  RDW 13.7 13.7 13.6   ------------------------------------------------------------------------------------------------------------------  Chemistries  Recent Labs Lab 05/27/15 1135 05/28/15 0027 05/29/15 0811  NA 132* 134* 132*  K 4.2 3.5 4.2  CL 96* 102 101  CO2 24  27 27   GLUCOSE 284* 177* 238*  BUN 21* 17 19  CREATININE 1.62* 1.29* 1.27*  CALCIUM 9.1 8.2* 8.1*   ------------------------------------------------------------------------------------------------------------------ estimated creatinine clearance is 92.9 mL/min (by C-G formula based on Cr of 1.27). ------------------------------------------------------------------------------------------------------------------ No results for input(s): TSH, T4TOTAL, T3FREE, THYROIDAB in the last 72 hours.  Invalid input(s): FREET3  Cardiac Enzymes  Recent Labs Lab 05/27/15 1135 05/27/15 1827 05/28/15 0027  TROPONINI 0.03 <0.03 <0.03   ------------------------------------------------------------------------------------------------------------------ Invalid input(s): POCBNP ---------------------------------------------------------------------------------------------------------------  RADIOLOGY: No results found.  EKG:  Orders placed or performed during the hospital encounter of 05/11/15  . EKG 12 lead  . EKG 12 lead    ASSESSMENT AND PLAN:  Principal Problem:   UTI (urinary tract infection) Active Problems:   UTI (lower urinary tract infection) 1. Acute pyelonephritis, reported urine cx by microbiology today is enterococcus sensitive to ampicillin, discontinue rocephin, give one dose of Zosyn and initiate patient on Augmentin, following final culture results, clinically,  no hydronephrosis 2. Acute on chronic renal failure, improving on IVF administration 3. Hyponatremia, worsened on IVF, discontinue IV fluids, follow in the morning 4. Leukocytosis, improving 5. Nausea, resolved, continue regular diet 6. Chest pain, etiology is unclear, cardiac enzymes were negative, getting Myoview in the morning, possible discharge home if Myoview is negative and urinary cultures are known   Management plans discussed with the patient, family and they are in agreement.   DRUG ALLERGIES:  Allergies   Allergen Reactions  . Hydromorphone Other (See Comments)    Reaction:  Drops pts BP   . Floxin [Ofloxacin] Rash    CODE STATUS:     Code Status Orders        Start     Ordered   05/27/15 1835  Full code   Continuous     05/27/15 1834    Code Status History    Date Active Date Inactive Code Status Order ID Comments User Context   This patient has a current code status but no historical code status.      TOTAL TIME TAKING CARE OF THIS PATIENT: 40 minutes.    Theodoro Grist M.D on 05/29/2015 at 3:10 PM  Between 7am to 6pm - Pager - [phone]  After 6pm go to [email or web address] - password [email or web address] [facility]  Office  [phone]  CC: Primary care physician; Valera Castle, MD

## 2015-05-30 ENCOUNTER — Inpatient Hospital Stay: Payer: 59

## 2015-05-30 ENCOUNTER — Encounter: Payer: Self-pay | Admitting: Radiology

## 2015-05-30 ENCOUNTER — Other Ambulatory Visit: Payer: 59 | Admitting: Urology

## 2015-05-30 DIAGNOSIS — Q600 Renal agenesis, unilateral: Secondary | ICD-10-CM

## 2015-05-30 DIAGNOSIS — IMO0002 Reserved for concepts with insufficient information to code with codable children: Secondary | ICD-10-CM

## 2015-05-30 DIAGNOSIS — R079 Chest pain, unspecified: Secondary | ICD-10-CM

## 2015-05-30 DIAGNOSIS — R109 Unspecified abdominal pain: Secondary | ICD-10-CM

## 2015-05-30 DIAGNOSIS — D72829 Elevated white blood cell count, unspecified: Secondary | ICD-10-CM

## 2015-05-30 DIAGNOSIS — E1110 Type 2 diabetes mellitus with ketoacidosis without coma: Secondary | ICD-10-CM

## 2015-05-30 DIAGNOSIS — E871 Hypo-osmolality and hyponatremia: Secondary | ICD-10-CM

## 2015-05-30 DIAGNOSIS — N182 Chronic kidney disease, stage 2 (mild): Secondary | ICD-10-CM

## 2015-05-30 HISTORY — DX: Type 2 diabetes mellitus with ketoacidosis without coma: E11.10

## 2015-05-30 LAB — URINE CULTURE: Culture: >=100000

## 2015-05-30 LAB — GLUCOSE, CAPILLARY
Glucose-Capillary: 134 mg/dL — ABNORMAL HIGH (ref 65–99)
Glucose-Capillary: 148 mg/dL — ABNORMAL HIGH (ref 65–99)
Glucose-Capillary: 147 mg/dL — ABNORMAL HIGH (ref 65–99)

## 2015-05-30 LAB — NM MYOCAR MULTI W/SPECT W/WALL MOTION / EF
LV dias vol: 135 mL
LV sys vol: 67 mL
SDS: 0
SRS: 1
SSS: 1
TID: 0.94

## 2015-05-30 LAB — BASIC METABOLIC PANEL
Anion gap: 6 (ref 5–15)
BUN: 19 mg/dL (ref 6–20)
CO2: 27 mmol/L (ref 22–32)
Calcium: 8.5 mg/dL — ABNORMAL LOW (ref 8.9–10.3)
Chloride: 104 mmol/L (ref 101–111)
Creatinine, Ser: 1.25 mg/dL — ABNORMAL HIGH (ref 0.61–1.24)
GFR calc Af Amer: >60 mL/min (ref >60)
GFR calc non Af Amer: >60 mL/min (ref >60)
Glucose, Bld: 141 mg/dL — ABNORMAL HIGH (ref 65–99)
Potassium: 4 mmol/L (ref 3.5–5.1)
Sodium: 137 mmol/L (ref 135–145)

## 2015-05-30 MED ORDER — REGADENOSON 0.4 MG/5ML IV SOLN
0.4000 mg | Freq: Once | INTRAVENOUS | Status: AC
  Administered 2015-05-30: 0.4 mg via INTRAVENOUS

## 2015-05-30 MED ORDER — SENNA 8.6 MG PO TABS: 1.0000 | ORAL_TABLET | Freq: Every day | ORAL | Status: DC

## 2015-05-30 MED ORDER — DOCUSATE SODIUM 100 MG PO CAPS: 100.0000 mg | ORAL_CAPSULE | Freq: Two times a day (BID) | ORAL | Status: DC

## 2015-05-30 MED ORDER — HYDROCODONE-ACETAMINOPHEN 5-325 MG PO TABS: 2.0000 | ORAL_TABLET | ORAL | Status: DC | PRN

## 2015-05-30 MED ORDER — AMOXICILLIN-POT CLAVULANATE 875-125 MG PO TABS: 1.0000 | ORAL_TABLET | Freq: Two times a day (BID) | ORAL | Status: DC

## 2015-05-30 MED ORDER — TECHNETIUM TC 99M SESTAMIBI - CARDIOLITE
13.8520 | Freq: Once | INTRAVENOUS | Status: AC | PRN
  Administered 2015-05-30: 13.852 via INTRAVENOUS

## 2015-05-30 MED ORDER — TECHNETIUM TC 99M SESTAMIBI - CARDIOLITE
34.0060 | Freq: Once | INTRAVENOUS | Status: AC | PRN
  Administered 2015-05-30: 34.006 via INTRAVENOUS

## 2015-05-30 NOTE — Progress Notes (Signed)
SUBJECTIVE: Patient was admiited with chest pains associated with shortness of breath   Filed Vitals:   05/29/15 1311 05/29/15 2055 05/30/15 0530 05/30/15 1231  BP: 118/75 135/86 151/94 124/81  Pulse: 69 73 74 71  Temp: 98.1 F (36.7 C) 98 F (36.7 C) 97.4 F (36.3 C) 98.2 F (36.8 C)  TempSrc: Oral Oral Oral Oral  Resp: 17 17 17 18   Height:      Weight:      SpO2: 98% 100% 100% 100%    Intake/Output Summary (Last 24 hours) at 05/30/15 1414 Last data filed at 05/30/15 1233  Gross per 24 hour  Intake    480 ml  Output   3700 ml  Net  -3220 ml    LABS: Basic Metabolic Panel:  Recent Labs  05/29/15 0811 05/30/15 0732  NA 132* 137  K 4.2 4.0  CL 101 104  CO2 27 27  GLUCOSE 238* 141*  BUN 19 19  CREATININE 1.27* 1.25*  CALCIUM 8.1* 8.5*   Liver Function Tests: No results for input(s): AST, ALT, ALKPHOS, BILITOT, PROT, ALBUMIN in the last 72 hours. No results for input(s): LIPASE, AMYLASE in the last 72 hours. CBC:  Recent Labs  05/28/15 0027 05/29/15 0811  WBC 13.0* 8.5  HGB 14.0 12.8*  HCT 42.0 38.4*  MCV 87.4 88.2  PLT 123* 137*   Cardiac Enzymes:  Recent Labs  05/27/15 1827 05/28/15 0027  TROPONINI <0.03 <0.03   BNP: Invalid input(s): POCBNP D-Dimer: No results for input(s): DDIMER in the last 72 hours. Hemoglobin A1C: No results for input(s): HGBA1C in the last 72 hours. Fasting Lipid Panel:  Recent Labs  05/28/15 0027  CHOL 175  HDL 43  LDLCALC 88  TRIG 220*  CHOLHDL 4.1   Thyroid Function Tests: No results for input(s): TSH, T4TOTAL, T3FREE, THYROIDAB in the last 72 hours.  Invalid input(s): FREET3 Anemia Panel: No results for input(s): VITAMINB12, FOLATE, FERRITIN, TIBC, IRON, RETICCTPCT in the last 72 hours.   PHYSICAL EXAM General: Well developed, well nourished, in no acute distress HEENT:  Normocephalic and atramatic Neck:  No JVD.  Lungs: Clear bilaterally to auscultation and percussion. Heart: HRRR . Normal S1 and  S2 without gallops or murmurs.  Abdomen: Bowel sounds are positive, abdomen soft and non-tender  Msk:  Back normal, normal gait. Normal strength and tone for age. Extremities: No clubbing, cyanosis or edema.   Neuro: Alert and oriented X 3. Psych:  Good affect, responds appropriately  TELEMETRY:NSR  ASSESSMENT AND PLAN: Patient r/o for MI and has no longer chest pains. Lexiscan myoview revealed fixed anteroseptal defect with LVEF 46 %. No difinite ischaemia. [facility]ise f/u in office next Thursday 10 am and patient can go home.  Principal Problem:   Acute pyelonephritis Active Problems:   Pain due to ureteral stent (HCC)   Left sided abdominal pain   Chest pain   CKD (chronic kidney disease), stage II   Hyponatremia   Leukocytosis   DM (diabetes mellitus) type 2, uncontrolled, with ketoacidosis ([place])   Solitary kidney    Neoma Laming A, MD, [facility] 05/30/2015 2:14 PM

## 2015-05-30 NOTE — Progress Notes (Signed)
         To Whom It May Concern      Mr. Anthony Fuller was hospitalized at [facility] from 05/27/2015 through 05/30/2015. Patient was recommended by me to return back to work at full capacity on 06/07/2015. Thank you for your understanding.       Sincerely,  Theodoro Grist, MD

## 2015-05-30 NOTE — Discharge Summary (Signed)
[facility] [place] NAME: Anthony Fuller    MR#:  [id number]  DATE OF BIRTH:  [birth year]  DATE OF ADMISSION:  05/27/2015 ADMITTING PHYSICIAN: Vaughan Basta, MD  DATE OF DISCHARGE: No discharge date for patient encounter.  PRIMARY CARE PHYSICIAN: Valera Castle, MD     ADMISSION DIAGNOSIS:  Pyelonephritis [N12] Single kidney [Z90.5]  DISCHARGE DIAGNOSIS:  Principal Problem:   Acute pyelonephritis Active Problems:   Pain due to ureteral stent [facility])   Left sided abdominal pain   Chest pain   Solitary kidney   CKD (chronic kidney disease), stage II   Hyponatremia   Leukocytosis   DM (diabetes mellitus) type 2, uncontrolled, with ketoacidosis ([place])   SECONDARY DIAGNOSIS:   Past Medical History  Diagnosis Date  . Arthritis   . GERD (gastroesophageal reflux disease)   . Hypertension   . Diabetes mellitus without complication ([place])   . Cancer ([place]) 2012    R kidney removed  . 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 Because of concern of acute pyelonephritis patient was sent from urology to ER. His urine revealed pyuria and the patient was started on antibiotic intravenously and admitted.  Urinary cultures revealed enterococcus faecalis, sensitive to ampicillin, patient's antibiotic was changed to Augmentin to be given for 2 week course. Patient underwent a Myoview stress test which was  remarkable for small defect in basal anteroseptal location, however, it was nonreversible and was felt to be low risk study. Ejection fraction was found to be 45-54%, mildly decreased. Discussion by problem 1. Acute pyelonephritis due to Enterococcus faecalis, sensitive to ampicillin, patient is to continue Augmentin for 2 weeks, he is to follow-up with urology for stent removal, discussed his urologist today 2. Acute on chronic renal failure, improved on IVF administration, follow closely as outpatient 3. Hyponatremia, worsened on IVF, normalized by the day of discharge 4. Leukocytosis, resolved 5. Nausea, resolved, continue regular diet 6. Chest pain, etiology is unclear, cardiac enzymes were negative, Myoview stress test was remarkable for small reversible defect in basilar anteroseptal location and was felt to be low risk study. Patient was advised to follow up with cardiologist as outpatient DISCHARGE CONDITIONS:   Stable  CONSULTS OBTAINED:     DRUG ALLERGIES:   Allergies  Allergen Reactions  . Hydromorphone Other (See Comments)    Reaction:  Drops pts BP   . Floxin [Ofloxacin] Rash    DISCHARGE MEDICATIONS:   Current Discharge Medication List    START taking these medications   Details  amoxicillin-clavulanate (AUGMENTIN) 875-125 MG tablet Take 1 tablet by mouth every 12 (twelve) hours. Qty: 20 tablet, Refills: 0    docusate sodium (COLACE) 100 MG capsule Take 1 capsule (100 mg total) by mouth 2 (two) times daily. Qty: 10 capsule, Refills: 0    senna (SENOKOT) 8.6 MG TABS tablet Take 1 tablet (8.6 mg total) by mouth  daily. Qty: 120 each, Refills: 0      CONTINUE these medications which have CHANGED   Details  HYDROcodone-acetaminophen (NORCO/VICODIN) 5-325 MG tablet Take 2 tablets by mouth every 4 (four) hours as needed for moderate pain. Qty: 40 tablet, Refills: 0      CONTINUE these medications which have NOT CHANGED   Details  citalopram (CELEXA) 20 MG tablet  Take 20 mg by mouth at bedtime.     dicyclomine (BENTYL) 10 MG capsule Take 10 mg by mouth 3 (three) times daily as needed for spasms.     loratadine (CLARITIN) 10 MG tablet Take 10 mg by mouth daily as needed for allergies.     nortriptyline (PAMELOR) 50 MG capsule Take 50 mg by mouth at bedtime.     omeprazole (PRILOSEC) 40 MG capsule Take 40 mg by mouth at bedtime.     !! pregabalin (LYRICA) 150 MG capsule Take 150 mg by mouth at bedtime.     !! pregabalin (LYRICA) 50 MG capsule Take 50 mg by mouth every morning.     tamsulosin (FLOMAX) 0.4 MG CAPS capsule Take 0.4 mg by mouth at bedtime.     terbinafine (LAMISIL) 250 MG tablet Take 250 mg by mouth at bedtime.     tiZANidine (ZANAFLEX) 4 MG tablet Take 4-8 mg by mouth 3 (three) times daily as needed for muscle spasms.    traMADol (ULTRAM) 50 MG tablet Take 50 mg by mouth every 6 (six) hours as needed for moderate pain. for pain Refills: 1     !! - Potential duplicate medications found. Please discuss with provider.    STOP taking these medications     empagliflozin (JARDIANCE) 10 MG TABS tablet      metFORMIN (GLUCOPHAGE-XR) 500 MG 24 hr tablet          DISCHARGE INSTRUCTIONS:    Patient is to follow-up with his primary care physician, urologist, cardiologist as outpatient  If you experience worsening of your admission symptoms, develop shortness of breath, life threatening emergency, suicidal or homicidal thoughts you must seek medical attention immediately by calling 911 or calling your MD immediately  if symptoms less severe.  You Must read complete instructions/literature along with all the possible adverse reactions/side effects for all the Medicines you take and that have been prescribed to you. Take any new Medicines after you have completely understood and accept all the possible adverse reactions/side effects.   Please note  You were cared for by a hospitalist during your hospital stay. If you have any  questions about your discharge medications or the care you received while you were in the hospital after you are discharged, you can call the unit and asked to speak with the hospitalist on call if the hospitalist that took care of you is not available. Once you are discharged, your primary care physician will handle any further medical issues. Please note that NO REFILLS for any discharge medications will be authorized once you are discharged, as it is imperative that you return to your primary care physician (or establish a relationship with a primary care physician if you do not have one) for your aftercare needs so that they can reassess your need for medications and monitor your lab values.    Today   CHIEF COMPLAINT:   Chief Complaint  Patient presents with  . Fever  . Flank Pain    HISTORY OF PRESENT ILLNESS:  Anthony Fuller  is a 54 y.o. 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 Because of concern of acute pyelonephritis patient was sent from urology to ER. His urine revealed pyuria and the patient was started on antibiotic intravenously and admitted.  Urinary cultures revealed enterococcus faecalis, sensitive to ampicillin, patient's antibiotic was changed to Augmentin to be given for 2 week course. Patient underwent a Myoview stress test which was remarkable for small defect in basal anteroseptal location, however, it was nonreversible and was felt to be low risk study. Ejection fraction was found to be 45-54%, mildly decreased. Discussion by problem 1. Acute pyelonephritis due to  Enterococcus faecalis, sensitive to ampicillin, patient is to continue Augmentin for 2 weeks, he is to follow-up with urology for stent removal, discussed his urologist today 2. Acute on chronic renal failure, improved on IVF administration, follow closely as outpatient 3. Hyponatremia, worsened on IVF, normalized by the day of discharge 4. Leukocytosis, resolved 5. Nausea, resolved, continue regular diet 6. Chest pain, etiology is unclear, cardiac enzymes were negative, Myoview stress test was remarkable for small reversible defect in basilar anteroseptal location and was felt to be low risk study. Patient was advised to follow up with cardiologist as outpatient    VITAL SIGNS:  Blood pressure 124/81, pulse 71, temperature 98.2 F (36.8 C), temperature source Oral, resp. rate 18, height 6\' 2"  (1.88 m), weight 117.935 kg (260 lb), SpO2 100 %.  I/O:   Intake/Output Summary (Last 24 hours) at 05/30/15 1742 Last data filed at 05/30/15 1300  Gross per 24 hour  Intake    480 ml  Output   3700 ml  Net  -3220 ml    PHYSICAL EXAMINATION:  GENERAL:  55 y.o.-year-old patient lying in the bed with no acute distress.  EYES: Pupils equal, round, reactive to light and accommodation. No scleral icterus. Extraocular muscles intact.  HEENT: Head atraumatic, normocephalic. Oropharynx and nasopharynx clear.  NECK:  Supple, no jugular venous distention. No thyroid enlargement, no tenderness.  LUNGS: Normal breath sounds bilaterally, no wheezing, rales,rhonchi or crepitation. No use of accessory muscles of respiration.  CARDIOVASCULAR: S1, S2 normal. No murmurs, rubs, or gallops.  ABDOMEN: Soft, non-tender, non-distended. Bowel sounds present. No organomegaly or mass.  EXTREMITIES: No pedal edema, cyanosis, or clubbing.  NEUROLOGIC: Cranial nerves II through XII are intact. Muscle strength 5/5 in all extremities. Sensation intact. Gait not checked.  PSYCHIATRIC: The patient is alert and oriented x 3.   SKIN: No obvious rash, lesion, or ulcer.   DATA REVIEW:   CBC  Recent Labs Lab 05/29/15 0811  WBC 8.5  HGB 12.8*  HCT 38.4*  PLT 137*    Chemistries   Recent Labs Lab 05/30/15 0732  NA 137  K 4.0  CL 104  CO2 27  GLUCOSE 141*  BUN 19  CREATININE 1.25*  CALCIUM 8.5*    Cardiac Enzymes  Recent Labs Lab 05/28/15 0027  TROPONINI <0.03    Microbiology Results  Results for orders placed or performed during the hospital encounter of 05/27/15  Urine C&S     Status: None   Collection Time: 05/27/15 11:35 AM  Result Value  Ref Range Status   Specimen Description URINE, RANDOM  Final   Special Requests none  Final   Culture >=100,000 COLONIES/mL ENTEROCOCCUS FAECALIS  Final   Report Status 05/30/2015 FINAL  Final   Organism ID, Bacteria ENTEROCOCCUS FAECALIS  Final      Susceptibility   Enterococcus faecalis - MIC*    AMPICILLIN <=2 SENSITIVE Sensitive     LINEZOLID 2 SENSITIVE Sensitive     LEVOFLOXACIN Value in next row Sensitive      SENSITIVE0.5    NITROFURANTOIN Value in next row Sensitive      SENSITIVE<=16    VANCOMYCIN Value in next row Sensitive      SENSITIVE1    TETRACYCLINE Value in next row Resistant      RESISTANT>=16    CIPROFLOXACIN Value in next row Sensitive      SENSITIVE<=0.5    * >=100,000 COLONIES/mL ENTEROCOCCUS FAECALIS    RADIOLOGY:  Nm Myocar Multi W/spect W/wall Motion / Ef  05/30/2015   Defect 1: There is a small defect of mild severity present in the basal anteroseptal location.  This is a low risk study.  The left ventricular ejection fraction is mildly decreased (45-54%).     EKG:   Orders placed or performed during the hospital encounter of 05/11/15  . EKG 12 lead  . EKG 12 lead      Management plans discussed with the patient, family and they are in agreement.  CODE STATUS:     Code Status Orders        Start     Ordered   05/27/15 1835  Full code   Continuous     05/27/15 1834    Code Status History     Date Active Date Inactive Code Status Order ID Comments User Context   This patient has a current code status but no historical code status.      TOTAL TIME TAKING CARE OF THIS PATIENT: 40 minutes.  Discussed with urology today over the phone  Eilleen Davoli M.D on 05/30/2015 at 5:42 PM  Between 7am to 6pm - Pager - [phone]  After 6pm go to [email or web address] - password [email or web address] [facility]  Office  [phone]  CC: Primary care physician; Valera Castle, MD

## 2015-05-30 NOTE — Progress Notes (Signed)
Pt stable. IV removed. D/c instructions given and education provided. Signed prescriptions verified and given. Some electronically sent to pharmacy. Explained to patient to schedule and keep follow-up appointments. Pt states he understands instructions. Pt dressed and escorted out by staff. Driven home by family.

## 2015-06-01 ENCOUNTER — Telehealth: Payer: Self-pay | Admitting: Urology

## 2015-06-01 NOTE — Telephone Encounter (Signed)
Pt's wife, June, called and said hospital told him that stent should not be removed until infection cleared up.  They gave him 10 days of antibiotics.  She wanted to know if it was O.K. To leave stent in for 3 weeks.  Please give her a call at [phone]

## 2015-06-03 NOTE — Telephone Encounter (Signed)
Pt does not have a string attached to stent. Hospitalist told them the infection needs to be gone before stent removal. Please advise.

## 2015-06-03 NOTE — Telephone Encounter (Signed)
Ok to leave stent for 3 weeks. Continue course of antibiotics. Check urine culture prior to stent removal.

## 2015-06-03 NOTE — Telephone Encounter (Signed)
Spoke with pt wife and made aware of Dr. Carlynn Purl orders. Wife voiced understanding. Pt will RTC 06/10/15 for another ucx.

## 2015-06-10 ENCOUNTER — Other Ambulatory Visit: Payer: 59

## 2015-06-10 DIAGNOSIS — N200 Calculus of kidney: Secondary | ICD-10-CM

## 2015-06-10 LAB — URINALYSIS, COMPLETE
Bilirubin, UA: NEGATIVE
Ketones, UA: NEGATIVE
Nitrite, UA: NEGATIVE
Specific Gravity, UA: 1.025 (ref 1.005–1.030)
Urobilinogen, Ur: 0.2 mg/dL (ref 0.2–1.0)
pH, UA: 5.5 (ref 5.0–7.5)

## 2015-06-10 LAB — MICROSCOPIC EXAMINATION: RBC, UA: >30 /hpf — ABNORMAL HIGH (ref 0–?)

## 2015-06-10 NOTE — Progress Notes (Signed)
Pt came by the office to drop off urine sample for UA & Urine culture to verify infection had cleared up. He's complaining of constant burning and pain in his testicles. He would like to get his work note extended pass Monday, Jan. 30th.  I spoke w/ Dr.Budzyn and he informed me that he never give the pt time out of work for stents. Also he recommended that the pt takes Myrbetriq 25MG  to help with the referred pain.

## 2015-06-11 LAB — URINE CULTURE: Organism ID, Bacteria: NO GROWTH

## 2015-06-15 NOTE — Telephone Encounter (Signed)
Pt wife called wanting ucx results. Made aware of negative results. Pt and wife want to know when can stent be removed. Please advise.

## 2015-06-15 NOTE — Telephone Encounter (Signed)
Ok to schedule him for cysto stent removal in the office

## 2015-06-16 ENCOUNTER — Inpatient Hospital Stay: Admission: AD | Admit: 2015-06-16 | Payer: 59 | Source: Ambulatory Visit | Admitting: Internal Medicine

## 2015-06-16 ENCOUNTER — Emergency Department: Payer: 59

## 2015-06-16 ENCOUNTER — Encounter: Payer: Self-pay | Admitting: Emergency Medicine

## 2015-06-16 ENCOUNTER — Inpatient Hospital Stay
Admission: EM | Admit: 2015-06-16 | Discharge: 2015-06-21 | DRG: 698 | Disposition: A | Discharge status: Home / Self Care | Payer: 59 | Attending: Internal Medicine | Admitting: Internal Medicine

## 2015-06-16 DIAGNOSIS — Z8249 Family history of ischemic heart disease and other diseases of the circulatory system: Secondary | ICD-10-CM | POA: Diagnosis not present

## 2015-06-16 DIAGNOSIS — M47812 Spondylosis without myelopathy or radiculopathy, cervical region: Secondary | ICD-10-CM | POA: Diagnosis present

## 2015-06-16 DIAGNOSIS — Y731 Therapeutic (nonsurgical) and rehabilitative gastroenterology and urology devices associated with adverse incidents: Secondary | ICD-10-CM | POA: Diagnosis present

## 2015-06-16 DIAGNOSIS — F419 Anxiety disorder, unspecified: Secondary | ICD-10-CM | POA: Diagnosis present

## 2015-06-16 DIAGNOSIS — T83592A Infection and inflammatory reaction due to indwelling ureteral stent, initial encounter: Secondary | ICD-10-CM | POA: Diagnosis present

## 2015-06-16 DIAGNOSIS — I10 Essential (primary) hypertension: Secondary | ICD-10-CM | POA: Diagnosis present

## 2015-06-16 DIAGNOSIS — I959 Hypotension, unspecified: Secondary | ICD-10-CM | POA: Diagnosis present

## 2015-06-16 DIAGNOSIS — R0789 Other chest pain: Secondary | ICD-10-CM | POA: Diagnosis present

## 2015-06-16 DIAGNOSIS — Z960 Presence of urogenital implants: Secondary | ICD-10-CM

## 2015-06-16 DIAGNOSIS — Z825 Family history of asthma and other chronic lower respiratory diseases: Secondary | ICD-10-CM

## 2015-06-16 DIAGNOSIS — Z85528 Personal history of other malignant neoplasm of kidney: Secondary | ICD-10-CM

## 2015-06-16 DIAGNOSIS — R52 Pain, unspecified: Secondary | ICD-10-CM

## 2015-06-16 DIAGNOSIS — M549 Dorsalgia, unspecified: Secondary | ICD-10-CM

## 2015-06-16 DIAGNOSIS — M4802 Spinal stenosis, cervical region: Secondary | ICD-10-CM | POA: Diagnosis present

## 2015-06-16 DIAGNOSIS — A419 Sepsis, unspecified organism: Secondary | ICD-10-CM | POA: Diagnosis present

## 2015-06-16 DIAGNOSIS — Z87440 Personal history of urinary (tract) infections: Secondary | ICD-10-CM

## 2015-06-16 DIAGNOSIS — M5030 Other cervical disc degeneration, unspecified cervical region: Secondary | ICD-10-CM | POA: Diagnosis present

## 2015-06-16 DIAGNOSIS — B9620 Unspecified Escherichia coli [E. coli] as the cause of diseases classified elsewhere: Secondary | ICD-10-CM | POA: Diagnosis not present

## 2015-06-16 DIAGNOSIS — E871 Hypo-osmolality and hyponatremia: Secondary | ICD-10-CM | POA: Diagnosis present

## 2015-06-16 DIAGNOSIS — Z881 Allergy status to other antibiotic agents status: Secondary | ICD-10-CM | POA: Diagnosis not present

## 2015-06-16 DIAGNOSIS — Z7984 Long term (current) use of oral hypoglycemic drugs: Secondary | ICD-10-CM | POA: Diagnosis not present

## 2015-06-16 DIAGNOSIS — Z79899 Other long term (current) drug therapy: Secondary | ICD-10-CM

## 2015-06-16 DIAGNOSIS — R079 Chest pain, unspecified: Secondary | ICD-10-CM

## 2015-06-16 DIAGNOSIS — E1140 Type 2 diabetes mellitus with diabetic neuropathy, unspecified: Secondary | ICD-10-CM | POA: Diagnosis present

## 2015-06-16 DIAGNOSIS — Z905 Acquired absence of kidney: Secondary | ICD-10-CM

## 2015-06-16 DIAGNOSIS — K219 Gastro-esophageal reflux disease without esophagitis: Secondary | ICD-10-CM | POA: Diagnosis present

## 2015-06-16 DIAGNOSIS — N10 Acute tubulo-interstitial nephritis: Secondary | ICD-10-CM | POA: Diagnosis present

## 2015-06-16 DIAGNOSIS — Z833 Family history of diabetes mellitus: Secondary | ICD-10-CM

## 2015-06-16 DIAGNOSIS — N12 Tubulo-interstitial nephritis, not specified as acute or chronic: Secondary | ICD-10-CM | POA: Diagnosis not present

## 2015-06-16 DIAGNOSIS — Z87442 Personal history of urinary calculi: Secondary | ICD-10-CM | POA: Diagnosis not present

## 2015-06-16 DIAGNOSIS — Z823 Family history of stroke: Secondary | ICD-10-CM | POA: Diagnosis not present

## 2015-06-16 DIAGNOSIS — Z841 Family history of disorders of kidney and ureter: Secondary | ICD-10-CM | POA: Diagnosis not present

## 2015-06-16 DIAGNOSIS — Q600 Renal agenesis, unilateral: Secondary | ICD-10-CM | POA: Diagnosis not present

## 2015-06-16 DIAGNOSIS — M4800 Spinal stenosis, site unspecified: Secondary | ICD-10-CM

## 2015-06-16 DIAGNOSIS — N390 Urinary tract infection, site not specified: Secondary | ICD-10-CM | POA: Diagnosis not present

## 2015-06-16 LAB — TROPONIN I: Troponin I: <0.03 ng/mL (ref <0.031)

## 2015-06-16 LAB — BASIC METABOLIC PANEL
Anion gap: 13 (ref 5–15)
BUN: 15 mg/dL (ref 6–20)
CO2: 25 mmol/L (ref 22–32)
Calcium: 9.3 mg/dL (ref 8.9–10.3)
Chloride: 95 mmol/L — ABNORMAL LOW (ref 101–111)
Creatinine, Ser: 1.36 mg/dL — ABNORMAL HIGH (ref 0.61–1.24)
GFR calc Af Amer: >60 mL/min (ref >60)
GFR calc non Af Amer: 58 mL/min — ABNORMAL LOW (ref >60)
Glucose, Bld: 200 mg/dL — ABNORMAL HIGH (ref 65–99)
Potassium: 4 mmol/L (ref 3.5–5.1)
Sodium: 133 mmol/L — ABNORMAL LOW (ref 135–145)

## 2015-06-16 LAB — CBC
HCT: 46.6 % (ref 40.0–52.0)
Hemoglobin: 15.5 g/dL (ref 13.0–18.0)
MCH: 29.4 pg (ref 26.0–34.0)
MCHC: 33.2 g/dL (ref 32.0–36.0)
MCV: 88.6 fL (ref 80.0–100.0)
Platelets: 188 10*3/uL (ref 150–440)
RBC: 5.26 MIL/uL (ref 4.40–5.90)
RDW: 13.7 % (ref 11.5–14.5)
WBC: 18 10*3/uL — ABNORMAL HIGH (ref 3.8–10.6)

## 2015-06-16 LAB — GLUCOSE, CAPILLARY: Glucose-Capillary: 197 mg/dL — ABNORMAL HIGH (ref 65–99)

## 2015-06-16 MED ORDER — PANTOPRAZOLE SODIUM 40 MG PO TBEC
40.0000 mg | DELAYED_RELEASE_TABLET | Freq: Every day | ORAL | Status: DC
  Administered 2015-06-17 – 2015-06-21 (×4): 40 mg via ORAL
  Filled 2015-06-16 (×4): qty 1

## 2015-06-16 MED ORDER — TAMSULOSIN HCL 0.4 MG PO CAPS
0.4000 mg | ORAL_CAPSULE | Freq: Every day | ORAL | Status: DC
  Administered 2015-06-17 – 2015-06-20 (×5): 0.4 mg via ORAL
  Filled 2015-06-16 (×5): qty 1

## 2015-06-16 MED ORDER — TRAMADOL HCL 50 MG PO TABS
50.0000 mg | ORAL_TABLET | Freq: Four times a day (QID) | ORAL | Status: DC | PRN
  Administered 2015-06-18: 50 mg via ORAL
  Filled 2015-06-16: qty 1

## 2015-06-16 MED ORDER — ONDANSETRON HCL 4 MG/2ML IJ SOLN
4.0000 mg | Freq: Once | INTRAMUSCULAR | Status: AC
  Administered 2015-06-16: 4 mg via INTRAVENOUS
  Filled 2015-06-16: qty 2

## 2015-06-16 MED ORDER — ACETAMINOPHEN 650 MG RE SUPP: 650.0000 mg | Freq: Four times a day (QID) | RECTAL | Status: DC | PRN

## 2015-06-16 MED ORDER — DICYCLOMINE HCL 10 MG PO CAPS
10.0000 mg | ORAL_CAPSULE | Freq: Three times a day (TID) | ORAL | Status: DC | PRN
  Filled 2015-06-16: qty 1

## 2015-06-16 MED ORDER — MORPHINE SULFATE (PF) 4 MG/ML IV SOLN
4.0000 mg | Freq: Once | INTRAVENOUS | Status: AC
  Administered 2015-06-16: 4 mg via INTRAVENOUS
  Filled 2015-06-16: qty 1

## 2015-06-16 MED ORDER — PIPERACILLIN-TAZOBACTAM 3.375 G IVPB 30 MIN
3.3750 g | Freq: Once | INTRAVENOUS | Status: AC
  Administered 2015-06-16: 3.375 g via INTRAVENOUS
  Filled 2015-06-16: qty 50

## 2015-06-16 MED ORDER — ACETAMINOPHEN 325 MG PO TABS
650.0000 mg | ORAL_TABLET | Freq: Four times a day (QID) | ORAL | Status: DC | PRN
  Administered 2015-06-16 – 2015-06-19 (×3): 650 mg via ORAL
  Filled 2015-06-16 (×3): qty 2

## 2015-06-16 MED ORDER — DEXTROSE 5 % IV SOLN
2.0000 g | Freq: Once | INTRAVENOUS | Status: AC
  Administered 2015-06-17: 2 g via INTRAVENOUS
  Filled 2015-06-16: qty 2

## 2015-06-16 MED ORDER — HYDROCODONE-ACETAMINOPHEN 5-325 MG PO TABS
2.0000 | ORAL_TABLET | ORAL | Status: DC | PRN
  Administered 2015-06-17 – 2015-06-18 (×6): 2 via ORAL
  Filled 2015-06-16 (×6): qty 2

## 2015-06-16 MED ORDER — ACETAMINOPHEN 500 MG PO TABS
1000.0000 mg | ORAL_TABLET | Freq: Once | ORAL | Status: AC
  Administered 2015-06-16: 1000 mg via ORAL
  Filled 2015-06-16: qty 2

## 2015-06-16 MED ORDER — ONDANSETRON HCL 4 MG/2ML IJ SOLN
4.0000 mg | Freq: Four times a day (QID) | INTRAMUSCULAR | Status: DC | PRN
  Administered 2015-06-17: 4 mg via INTRAVENOUS
  Filled 2015-06-16: qty 2

## 2015-06-16 MED ORDER — TIZANIDINE HCL 4 MG PO TABS: 4.0000 mg | ORAL_TABLET | Freq: Three times a day (TID) | ORAL | Status: DC | PRN

## 2015-06-16 MED ORDER — CITALOPRAM HYDROBROMIDE 20 MG PO TABS
20.0000 mg | ORAL_TABLET | Freq: Every day | ORAL | Status: DC
  Administered 2015-06-17 – 2015-06-20 (×5): 20 mg via ORAL
  Filled 2015-06-16 (×5): qty 1

## 2015-06-16 MED ORDER — HEPARIN SODIUM (PORCINE) 5000 UNIT/ML IJ SOLN
5000.0000 [IU] | Freq: Three times a day (TID) | INTRAMUSCULAR | Status: DC
  Administered 2015-06-17 – 2015-06-20 (×11): 5000 [IU] via SUBCUTANEOUS
  Filled 2015-06-16 (×12): qty 1

## 2015-06-16 MED ORDER — TERBINAFINE HCL 250 MG PO TABS
250.0000 mg | ORAL_TABLET | Freq: Every day | ORAL | Status: DC
  Administered 2015-06-17 – 2015-06-20 (×5): 250 mg via ORAL
  Filled 2015-06-16 (×6): qty 1

## 2015-06-16 MED ORDER — METOPROLOL TARTRATE 25 MG PO TABS
25.0000 mg | ORAL_TABLET | Freq: Two times a day (BID) | ORAL | Status: DC
  Administered 2015-06-17 – 2015-06-19 (×5): 25 mg via ORAL
  Filled 2015-06-16 (×7): qty 1

## 2015-06-16 MED ORDER — VANCOMYCIN HCL IN DEXTROSE 1-5 GM/200ML-% IV SOLN
1000.0000 mg | Freq: Once | INTRAVENOUS | Status: AC
  Administered 2015-06-16: 1000 mg via INTRAVENOUS
  Filled 2015-06-16: qty 200

## 2015-06-16 MED ORDER — ONDANSETRON HCL 4 MG PO TABS: 4.0000 mg | ORAL_TABLET | Freq: Four times a day (QID) | ORAL | Status: DC | PRN

## 2015-06-16 MED ORDER — SODIUM CHLORIDE 0.9 % IV BOLUS (SEPSIS)
1000.0000 mL | Freq: Once | INTRAVENOUS | Status: AC
  Administered 2015-06-16: 1000 mL via INTRAVENOUS
  Filled 2015-06-16: qty 1000

## 2015-06-16 MED ORDER — SODIUM CHLORIDE 0.9 % IV SOLN
INTRAVENOUS | Status: DC
  Administered 2015-06-17 – 2015-06-19 (×6): via INTRAVENOUS

## 2015-06-16 MED ORDER — TRAMADOL HCL 50 MG PO TABS: 50.0000 mg | ORAL_TABLET | Freq: Four times a day (QID) | ORAL | Status: DC | PRN

## 2015-06-16 MED ORDER — LORATADINE 10 MG PO TABS: 10.0000 mg | ORAL_TABLET | Freq: Every day | ORAL | Status: DC | PRN

## 2015-06-16 MED ORDER — PREGABALIN 50 MG PO CAPS
50.0000 mg | ORAL_CAPSULE | ORAL | Status: DC
  Administered 2015-06-17 – 2015-06-19 (×3): 50 mg via ORAL
  Filled 2015-06-16 (×4): qty 1

## 2015-06-16 MED ORDER — PANTOPRAZOLE SODIUM 40 MG PO TBEC: 40.0000 mg | DELAYED_RELEASE_TABLET | Freq: Every day | ORAL | Status: DC

## 2015-06-16 MED ORDER — PREGABALIN 75 MG PO CAPS
150.0000 mg | ORAL_CAPSULE | Freq: Every day | ORAL | Status: DC
  Administered 2015-06-17 – 2015-06-20 (×5): 150 mg via ORAL
  Filled 2015-06-16 (×6): qty 2

## 2015-06-16 MED ORDER — INSULIN ASPART 100 UNIT/ML ~~LOC~~ SOLN
0.0000 [IU] | Freq: Every day | SUBCUTANEOUS | Status: DC
  Administered 2015-06-18: 4 [IU] via SUBCUTANEOUS
  Administered 2015-06-19: 3 [IU] via SUBCUTANEOUS
  Filled 2015-06-16: qty 3
  Filled 2015-06-16: qty 4

## 2015-06-16 MED ORDER — ALBUTEROL SULFATE (2.5 MG/3ML) 0.083% IN NEBU: 2.5000 mg | INHALATION_SOLUTION | RESPIRATORY_TRACT | Status: DC | PRN

## 2015-06-16 MED ORDER — INSULIN ASPART 100 UNIT/ML ~~LOC~~ SOLN
0.0000 [IU] | Freq: Three times a day (TID) | SUBCUTANEOUS | Status: DC
  Administered 2015-06-17: 3 [IU] via SUBCUTANEOUS
  Administered 2015-06-17: 2 [IU] via SUBCUTANEOUS
  Administered 2015-06-17: 3 [IU] via SUBCUTANEOUS
  Administered 2015-06-18: 2 [IU] via SUBCUTANEOUS
  Administered 2015-06-18: 3 [IU] via SUBCUTANEOUS
  Administered 2015-06-18: 1 [IU] via SUBCUTANEOUS
  Administered 2015-06-19 (×2): 7 [IU] via SUBCUTANEOUS
  Administered 2015-06-20: 3 [IU] via SUBCUTANEOUS
  Administered 2015-06-20: 2 [IU] via SUBCUTANEOUS
  Administered 2015-06-21: 1 [IU] via SUBCUTANEOUS
  Administered 2015-06-21: 2 [IU] via SUBCUTANEOUS
  Filled 2015-06-16: qty 3
  Filled 2015-06-16: qty 2
  Filled 2015-06-16: qty 7
  Filled 2015-06-16: qty 2
  Filled 2015-06-16 (×2): qty 3
  Filled 2015-06-16: qty 7
  Filled 2015-06-16 (×2): qty 2
  Filled 2015-06-16: qty 3
  Filled 2015-06-16 (×2): qty 1
  Filled 2015-06-16: qty 3

## 2015-06-16 NOTE — Telephone Encounter (Signed)
Called to make pt aware stent can be removed now. Wife stated that pt is currently being admitted to the hospital for chest pain and fever. Reinforced with wife to call back when pt is d/c from hospital. Wife voiced understanding.

## 2015-06-16 NOTE — H&P (Signed)
[facility] [place] NAME: Anthony Fuller    MR#:  [id number]  DATE OF BIRTH:  01/04/1961  DATE OF ADMISSION:  06/16/2015  PRIMARY CARE PHYSICIAN: Valera Castle, MD   REQUESTING/REFERRING PHYSICIAN: Lisa Roca, MD  CHIEF COMPLAINT:   Chief Complaint  Patient presents with  . Chest Pain   left flank pain, fever and chills for 2 days.  HISTORY OF PRESENT ILLNESS:  Anthony Fuller  is a 55 y.o. male with a known history of pyelonephritis, hypertension, diabetes and 1 kidney with a stent placement. Patient has a history of current indwelling left ureteral stent and a history of prior kidney infection with infected stone. He was admitted for pyelonephritis last month and was discharged with 2 weeks of Augmentin. He finished Augmentin one week ago. He has had fever, chills and left flank pain and chest pain for the past that 2 days. In addition, he has nausea without vomiting and sweats a lot. He also complains of dysuria and urinary frequency but no hematuria. His cardiologist, Dr. Humphrey Rolls, send patient to ED for further evaluation. He was found to have a fever 101.9 and leukocytosis at 18. He is treated with vancomycin and Zosyn in the ED for sepsis.  PAST MEDICAL HISTORY:   Past Medical History  Diagnosis Date  . Arthritis   . GERD (gastroesophageal reflux disease)   . Hypertension   . Diabetes mellitus without complication ([place])   . Cancer ([place]) 2012    R kidney removed  . 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  Marland Kitchen Anxiety     PAST SURGICAL HISTORY:   Past Surgical History  Procedure Laterality Date  . Spine surgery      lumbar 2013  . Kidney removed Right     2012  . Back surgery    . Nephrectomy Right 04/2011  . Ureteroscopy with holmium laser lithotripsy Left 05/18/2015    Procedure: URETEROSCOPY WITH HOLMIUM LASER LITHOTRIPSY;  Surgeon: Nickie Retort, MD;  Location: [facility] ORS;  Service: Urology;   Laterality: Left;  . Cystoscopy w/ retrogrades Left 05/18/2015    Procedure: CYSTOSCOPY WITH RETROGRADE PYELOGRAM;  Surgeon: Nickie Retort, MD;  Location: [facility] ORS;  Service: Urology;  Laterality: Left;  . Cystoscopy with stent placement Left 05/18/2015    Procedure: CYSTOSCOPY WITH STENT PLACEMENT;  Surgeon: Nickie Retort, MD;  Location: [facility] ORS;  Service: Urology;  Laterality: Left;    SOCIAL HISTORY:   Social History  Substance Use Topics  . Smoking status: Never Smoker   . Smokeless tobacco: Never Used  . Alcohol Use: No    FAMILY HISTORY:   Family History  Problem Relation Age of Onset  . Diabetes Mother   . Hypertension Mother   . Asthma Mother   . Heart disease Father   . Kidney disease Father   . Diabetes Father   . Stroke Father     DRUG ALLERGIES:   Allergies  Allergen Reactions  . Hydromorphone Other (See Comments)    Reaction:  Drops pts BP   . Floxin [Ofloxacin] Rash    REVIEW OF SYSTEMS:  CONSTITUTIONAL: has fever, chills and weakness.  EYES: No blurred or double vision.  EARS, NOSE, AND THROAT: No tinnitus or ear pain.  RESPIRATORY: No cough, shortness of breath, wheezing or hemoptysis.  CARDIOVASCULAR: has chest pain, no orthopnea, edema.  GASTROINTESTINAL: has nausea, no vomiting, diarrhea but has abdominal pain  and flank pain on left side.  GENITOURINARY: Has dysuria, no hematuria.  ENDOCRINE: No polyuria, nocturia,  HEMATOLOGY: No anemia, easy bruising or bleeding SKIN: No rash or lesion. MUSCULOSKELETAL: No joint pain or arthritis.   NEUROLOGIC: No tingling, numbness, weakness.  PSYCHIATRY: No anxiety or depression.   MEDICATIONS AT HOME:   Prior to Admission medications   Medication Sig Start Date End Date Taking? Authorizing Provider  citalopram (CELEXA) 20 MG tablet Take 20 mg by mouth at bedtime.    Yes Historical Provider, MD  dicyclomine (BENTYL) 10 MG capsule Take 10 mg by mouth 3 (three) times daily as needed for spasms.     Yes Historical Provider, MD  empagliflozin (JARDIANCE) 10 MG TABS tablet Take 10 mg by mouth every morning.   Yes Historical Provider, MD  HYDROcodone-acetaminophen (NORCO/VICODIN) 5-325 MG tablet Take 2 tablets by mouth every 4 (four) hours as needed for moderate pain. 05/30/15  Yes Theodoro Grist, MD  loratadine (CLARITIN) 10 MG tablet Take 10 mg by mouth daily as needed for allergies.    Yes Historical Provider, MD  metFORMIN (GLUCOPHAGE-XR) 500 MG 24 hr tablet Take 500 mg by mouth daily with supper.   Yes Historical Provider, MD  omeprazole (PRILOSEC) 40 MG capsule Take 40 mg by mouth at bedtime.    Yes Historical Provider, MD  pregabalin (LYRICA) 150 MG capsule Take 150 mg by mouth at bedtime.    Yes Historical Provider, MD  pregabalin (LYRICA) 50 MG capsule Take 50 mg by mouth every morning.    Yes Historical Provider, MD  senna (SENOKOT) 8.6 MG TABS tablet Take 1 tablet (8.6 mg total) by mouth daily. Patient taking differently: Take 1 tablet by mouth at bedtime.  05/30/15  Yes Theodoro Grist, MD  tamsulosin (FLOMAX) 0.4 MG CAPS capsule Take 0.4 mg by mouth at bedtime.    Yes Historical Provider, MD  terbinafine (LAMISIL) 250 MG tablet Take 250 mg by mouth at bedtime.    Yes Historical Provider, MD  tiZANidine (ZANAFLEX) 4 MG tablet Take 4-8 mg by mouth 3 (three) times daily as needed for muscle spasms.   Yes Historical Provider, MD  traMADol (ULTRAM) 50 MG tablet Take 50 mg by mouth every 6 (six) hours as needed for moderate pain. for pain   Yes Historical Provider, MD  amoxicillin-clavulanate (AUGMENTIN) 875-125 MG tablet Take 1 tablet by mouth every 12 (twelve) hours. Patient not taking: Reported on 06/16/2015 05/30/15   Theodoro Grist, MD  docusate sodium (COLACE) 100 MG capsule Take 1 capsule (100 mg total) by mouth 2 (two) times daily. Patient not taking: Reported on 06/16/2015 05/30/15   Theodoro Grist, MD      VITAL SIGNS:  Blood pressure 158/91, pulse 91, temperature 99.6 F (37.6 C),  temperature source Oral, resp. rate 18, height 6\' 2"  (1.88 m), weight 117.935 kg (260 lb), SpO2 96 %.  PHYSICAL EXAMINATION:  GENERAL:  55 y.o.-year-old patient lying in the bed with no acute distress. Morbidly obese. EYES: Pupils equal, round, reactive to light and accommodation. No scleral icterus. Extraocular muscles intact.  HEENT: Head atraumatic, normocephalic. Oropharynx and nasopharynx clear. Moist oral mucosa. NECK:  Supple, no jugular venous distention. No thyroid enlargement, no tenderness.  LUNGS: Normal breath sounds bilaterally, no wheezing, rales,rhonchi or crepitation. No use of accessory muscles of respiration.  CARDIOVASCULAR: S1, S2 normal. No murmurs, rubs, or gallops.  ABDOMEN: Soft, nontender, nondistended. Bowel sounds present. No organomegaly or mass. Left-sided CVA tenderness. EXTREMITIES: No pedal edema, cyanosis, or  clubbing.  NEUROLOGIC: Cranial nerves II through XII are intact. Muscle strength 5/5 in all extremities. Sensation intact. Gait not checked.  PSYCHIATRIC: The patient is alert and oriented x 3.  SKIN: No obvious rash, lesion, or ulcer.   LABORATORY PANEL:   CBC  Recent Labs Lab 06/16/15 1420  WBC 18.0*  HGB 15.5  HCT 46.6  PLT 188   ------------------------------------------------------------------------------------------------------------------  Chemistries   Recent Labs Lab 06/16/15 1420  NA 133*  K 4.0  CL 95*  CO2 25  GLUCOSE 200*  BUN 15  CREATININE 1.36*  CALCIUM 9.3   ------------------------------------------------------------------------------------------------------------------  Cardiac Enzymes  Recent Labs Lab 06/16/15 1420  TROPONINI <0.03   ------------------------------------------------------------------------------------------------------------------  RADIOLOGY:  Dg Chest Port 1 View  06/16/2015  CLINICAL DATA:  Chest pain beginning in early January, 2017 which has worsened over the past 2 days. Initial  encounter. EXAM: PORTABLE CHEST 1 VIEW COMPARISON:  Single view of the chest 01/16/2015. CT chest 04/03/2012. FINDINGS: The lungs are clear. Heart size is normal. No pneumothorax or pleural effusion. IMPRESSION: Negative chest Electronically Signed   By: Inge Rise M.D.   On: 06/16/2015 18:54    EKG:   Orders placed or performed during the hospital encounter of 06/16/15  . EKG 12-Lead  . EKG 12-Lead  . ED EKG within 10 minutes  . ED EKG within 10 minutes    IMPRESSION AND PLAN:   Sepsis, Possible pyelo-nephritis The patient will be admitted to medical floor. I will start sepsis protocol for possible pyelonephritis, start cefepime, follow-up blood culture, urinalysis and urine culture. Urology consult. Continue Flomax.  Acute renal failure. Give normal saline IV for rehydration and follow-up BMP. Hyponatremia. Give normal saline IV for rehydration and follow-up BMP. Hypertension. Start Lopressor. Diabetes. Start sliding scale.   All the records are reviewed and case discussed with ED provider. Management plans discussed with the patient, family and they are in agreement.  CODE STATUS: Full code  TOTAL TIME TAKING CARE OF THIS PATIENT: 58 minutes.    Demetrios Loll M.D on 06/16/2015 at 8:59 PM  Between 7am to 6pm - Pager - [phone]  After 6pm go to [email or web address] - password [email or web address]  Office  [phone]  CC: Primary care physician; Valera Castle, MD

## 2015-06-16 NOTE — ED Provider Notes (Signed)
[facility] Emergency Department Provider Note   ____________________________________________  Time seen: Approximately 6:30 PM I have reviewed the triage vital signs and the triage nursing note.  HISTORY  Chief Complaint Chest Pain   Historian Patient  HPI Anthony Fuller is a 55 y.o. male who is at the cardiologist Dr.Khan's office today and had a coronary CT. He was sent to the hospital for evaluation/admission due to fever and left flank pain out of concern for possible UTI. Patient has a history of current indwelling left ureteral stent and a history of prior kidney infection with infected stone. Patient states he had a fever for about 1-2 days. He's been having flank pain for about the same time period. No burning with urination. He's had some central chest pressure which is why he went to the cardiologist. Questionable mild cough which is nonproductive.  No real abdominal pain. No diarrhea. No vomiting. Positive for nausea.    Past Medical History  Diagnosis Date  . Arthritis   . GERD (gastroesophageal reflux disease)   . Hypertension   . Diabetes mellitus without complication ([place])   . Cancer ([place]) 2012    R kidney removed  . 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  Marland Kitchen Anxiety     Patient Active Problem List   Diagnosis Date Noted  . Sepsis ([place]) 06/16/2015  . Left sided abdominal pain 05/30/2015  . Chest pain 05/30/2015  . CKD (chronic kidney disease), 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  . Hyponatremia 05/30/2015  . Leukocytosis 05/30/2015  . DM (diabetes mellitus) type 2, uncontrolled, with ketoacidosis ([place]) 05/30/2015  . Solitary kidney 05/30/2015  . Acute pyelonephritis 05/27/2015  . Pain due to ureteral stent ([place]) 05/27/2015  . Enthesopathy of hip 04/11/2015  . Hepatic cirrhosis ([place]) 09/12/2014  . Fatty liver disease, nonalcoholic [id number]  . Cervical pain 08/06/2014  . Clostridium difficile infection 02/04/2014  . D  (diarrhea) 12/17/2013  . Arthralgia of hip 08/26/2013  . H/O urinary disorder 06/29/2013  . Acid indigestion 02/14/2012  . LBP (low back pain) 12/18/2011  . 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 Lumbar radiculopathy 09/05/2011  . Degeneration of intervertebral disc of lumbar region 08/23/2011  . Lumbar canal stenosis 08/23/2011  . 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 Thoracic and lumbosacral neuritis 07/24/2011  . Diabetes mellitus ([place]) 07/24/2011    Past Surgical History  Procedure Laterality Date  . Spine surgery      lumbar 2013  . Kidney removed Right     2012  . Back surgery    . Nephrectomy Right 04/2011  . Ureteroscopy with holmium laser lithotripsy Left 05/18/2015    Procedure: URETEROSCOPY WITH HOLMIUM LASER LITHOTRIPSY;  Surgeon: Nickie Retort, MD;  Location: [facility] ORS;  Service: Urology;  Laterality: Left;  . Cystoscopy w/ retrogrades Left 05/18/2015    Procedure: CYSTOSCOPY WITH RETROGRADE PYELOGRAM;  Surgeon: Nickie Retort, MD;  Location: [facility] ORS;  Service: Urology;  Laterality: Left;  . Cystoscopy with stent placement Left 05/18/2015    Procedure: CYSTOSCOPY WITH STENT PLACEMENT;  Surgeon: Nickie Retort, MD;  Location: [facility] ORS;  Service: Urology;  Laterality: Left;    Current Outpatient Rx  Name  Route  Sig  Dispense  Refill  . citalopram (CELEXA) 20 MG tablet   Oral   Take 20 mg by mouth at bedtime.          . dicyclomine (BENTYL) 10 MG capsule  Oral   Take 10 mg by mouth 3 (three) times daily as needed for spasms.          . empagliflozin (JARDIANCE) 10 MG TABS tablet   Oral   Take 10 mg by mouth every morning.         Marland Kitchen HYDROcodone-acetaminophen (NORCO/VICODIN) 5-325 MG tablet   Oral   Take 2 tablets by mouth every 4 (four) hours as needed for moderate pain.   40 tablet   0   . loratadine (CLARITIN) 10 MG tablet   Oral   Take 10 mg by mouth daily as needed for allergies.          . metFORMIN  (GLUCOPHAGE-XR) 500 MG 24 hr tablet   Oral   Take 500 mg by mouth daily with supper.         Marland Kitchen omeprazole (PRILOSEC) 40 MG capsule   Oral   Take 40 mg by mouth at bedtime.          . pregabalin (LYRICA) 150 MG capsule   Oral   Take 150 mg by mouth at bedtime.          . pregabalin (LYRICA) 50 MG capsule   Oral   Take 50 mg by mouth every morning.          . senna (SENOKOT) 8.6 MG TABS tablet   Oral   Take 1 tablet (8.6 mg total) by mouth daily. Patient taking differently: Take 1 tablet by mouth at bedtime.    120 each   0   . tamsulosin (FLOMAX) 0.4 MG CAPS capsule   Oral   Take 0.4 mg by mouth at bedtime.          . terbinafine (LAMISIL) 250 MG tablet   Oral   Take 250 mg by mouth at bedtime.          Marland Kitchen tiZANidine (ZANAFLEX) 4 MG tablet   Oral   Take 4-8 mg by mouth 3 (three) times daily as needed for muscle spasms.         . traMADol (ULTRAM) 50 MG tablet   Oral   Take 50 mg by mouth every 6 (six) hours as needed for moderate pain. for pain      1   . amoxicillin-clavulanate (AUGMENTIN) 875-125 MG tablet   Oral   Take 1 tablet by mouth every 12 (twelve) hours. Patient not taking: Reported on 06/16/2015   28 tablet   0   . docusate sodium (COLACE) 100 MG capsule   Oral   Take 1 capsule (100 mg total) by mouth 2 (two) times daily. Patient not taking: Reported on 06/16/2015   10 capsule   0     Allergies Hydromorphone and Floxin  Family History  Problem Relation Age of Onset  . Diabetes Mother   . Hypertension Mother   . Asthma Mother   . Heart disease Father   . Kidney disease Father   . Diabetes Father   . Stroke Father     Social History Social History  Substance Use Topics  . Smoking status: Never Smoker   . Smokeless tobacco: Never Used  . Alcohol Use: No    Review of Systems  Constitutional: Positive for fever. Eyes: Negative for visual changes. ENT: Negative for sore throat. Cardiovascular: Positive for chest  pain. Respiratory: Negative for shortness of breath. Gastrointestinal: Negative for vomiting and diarrhea. Genitourinary: Negative for dysuria. Musculoskeletal: Positive for left flank pain. Skin: Negative for rash. Neurological:  Positive for mild to moderate global headache. 10 point Review of Systems otherwise negative ____________________________________________   PHYSICAL EXAM:  VITAL SIGNS: ED Triage Vitals  Enc Vitals Group     BP 06/16/15 1358 129/80 mmHg     Pulse Rate 06/16/15 1358 80     Resp 06/16/15 1358 16     Temp 06/16/15 1716 101.9 F (38.8 C)     Temp Source 06/16/15 1716 Oral     SpO2 06/16/15 1358 96 %     Weight 06/16/15 1358 260 lb (117.935 kg)     Height 06/16/15 1358 6\' 2"  (1.88 m)     Head Cir --      Peak Flow --      Pain Score 06/16/15 1359 8     Pain Loc --      Pain Edu? --      Excl. in [place]? --      Constitutional: Alert and oriented. Looks like he doesn't feel well, but no acute distress. Eyes: Conjunctivae are normal. PERRL. Normal extraocular movements. ENT   Head: Normocephalic and atraumatic.   Nose: No congestion/rhinnorhea.   Mouth/Throat: Mucous membranes are moderately dry.   Neck: No stridor. Cardiovascular/Chest: Normal rate, regular rhythm.  No murmurs, rubs, or gallops. Respiratory: Normal respiratory effort without tachypnea nor retractions. Breath sounds are clear and equal bilaterally. No wheezes/rales/rhonchi. Gastrointestinal: Soft. No distention, no guarding, no rebound. Nontender.    Genitourinary/rectal:Deferred Musculoskeletal: Nontender with normal range of motion in all extremities. No joint effusions.  No lower extremity tenderness.  No edema. Neurologic:  Normal speech and language. No gross or focal neurologic deficits are appreciated. Skin:  Skin is warm, dry and intact. No rash noted. Psychiatric: Mood and affect are normal. Speech and behavior are normal. Patient exhibits appropriate insight and  judgment.  ____________________________________________   EKG I, Lisa Roca, MD, the attending physician have personally viewed and interpreted all ECGs.  80 bpm. Normal sinus rhythm. Narrow QRS. Normal axis. Nonspecific T-wave ____________________________________________  LABS (pertinent positives/negatives)  White blood cell count 18.0, hemoglobin 15.5 and bili count 188 Basic medical panel significant for sodium 133 and creatinine 1.36 Urinalysis pending  ____________________________________________  RADIOLOGY All Xrays were viewed by me. Imaging interpreted by Radiologist.  Chest x-ray portable: Negative chest __________________________________________  PROCEDURES  Procedure(s) performed: None  Critical Care performed: None  ____________________________________________   ED COURSE / ASSESSMENT AND PLAN  Pertinent labs & imaging results that were available during my care of the patient were reviewed by me and considered in my medical decision making (see chart for details).   Patient arrived with fever and flank pain with concern for likely urinary source given his recent similar symptoms with pyelonephritis/UTI. His white blood count is elevated at 18.  He doesn't really have symptoms of gastroenteritis, or pneumonia.  Patient was given Tylenol for fever. He was given pain and nausea medicine as well as IV fluid hydration. Given he has no hypotension, initial fluid bolus was 2 L, full 30 cc/kg was not given due to some congestive heart failure concern.  Code sepsis was initiated.  Patient unable to make urine at this time. I did discuss with Dr. Bridgett Larsson, hospitalist and we will go ahead and start antibiotics for sepsis including vancomycin and Zosyn. Hospital go ahead and admit. Urinalysis pending.    CONSULTATIONS:   Hospitalist for admission.   Patient / Family / Caregiver informed of clinical course, medical decision-making process, and agree with  plan.  ___________________________________________   FINAL CLINICAL IMPRESSION(S) / ED DIAGNOSES   Final diagnoses:  Sepsis, due to unspecified organism [facility])              Note: This dictation was prepared with Dragon dictation. Any transcriptional errors that result from this process are unintentional   Lisa Roca, MD 06/16/15 2012

## 2015-06-16 NOTE — Progress Notes (Signed)
Pharmacy Antibiotic Note  Anthony Fuller is a 55 y.o. male admitted on 06/16/2015 with urosepsis.  Pharmacy has been consulted for cefepime dosing.  Plan: Cefepime 2 gm IV x 1 given in ED, will continue with cefepime 1 gm IV Q8H when admitted.   Height: 6\' 2"  (188 cm) Weight: 260 lb (117.935 kg) IBW/kg (Calculated) : 82.2  Temp (24hrs), Avg:100.8 F (38.2 C), Min:99.6 F (37.6 C), Max:101.9 F (38.8 C)   Recent Labs Lab 06/16/15 1420  WBC 18.0*  CREATININE 1.36*    Estimated Creatinine Clearance: 86.7 mL/min (by C-G formula based on Cr of 1.36).    Allergies  Allergen Reactions  . Hydromorphone Other (See Comments)    Reaction:  Drops pts BP   . Floxin [Ofloxacin] Rash     Thank you for allowing pharmacy to be a part of this patient's care.  Laural Benes, Pharm.D., BCPS Clinical Pharmacist 06/16/2015 10:46 PM

## 2015-06-16 NOTE — ED Notes (Addendum)
Pt presents with chest pain since early jan; was admitted and it has been constant (6/10) ever since but has gotten worse in last 2 days. He got nitroglycerin spray at MD office today and had some relief but it is back. Pt was supposed to be direct admit, but somehow that has not happened and he was sent here from Dr. Laurelyn Sickle office.  States he has had severe flank pain for 2 days. He had kidney stones Jan 3or 4 and had lithotripsy. One week later he was hospitalized for kidney infection and began having chest pains.

## 2015-06-16 NOTE — ED Notes (Signed)
Says chest pain on andoff since jan 1.  [facility] bu tno beds available in hospital so they told him to come to the ED

## 2015-06-17 ENCOUNTER — Inpatient Hospital Stay: Payer: 59

## 2015-06-17 DIAGNOSIS — N12 Tubulo-interstitial nephritis, not specified as acute or chronic: Secondary | ICD-10-CM

## 2015-06-17 DIAGNOSIS — B9620 Unspecified Escherichia coli [E. coli] as the cause of diseases classified elsewhere: Secondary | ICD-10-CM

## 2015-06-17 DIAGNOSIS — N390 Urinary tract infection, site not specified: Secondary | ICD-10-CM

## 2015-06-17 DIAGNOSIS — Q600 Renal agenesis, unilateral: Secondary | ICD-10-CM

## 2015-06-17 DIAGNOSIS — Z960 Presence of urogenital implants: Secondary | ICD-10-CM

## 2015-06-17 LAB — PROTIME-INR
INR: 1.15
Prothrombin Time: 14.9 seconds (ref 11.4–15.0)

## 2015-06-17 LAB — APTT: aPTT: 25 seconds (ref 24–36)

## 2015-06-17 LAB — GLUCOSE, CAPILLARY
Glucose-Capillary: 205 mg/dL — ABNORMAL HIGH (ref 65–99)
Glucose-Capillary: 209 mg/dL — ABNORMAL HIGH (ref 65–99)
Glucose-Capillary: 168 mg/dL — ABNORMAL HIGH (ref 65–99)
Glucose-Capillary: 139 mg/dL — ABNORMAL HIGH (ref 65–99)

## 2015-06-17 LAB — CBC
HCT: 43.1 % (ref 40.0–52.0)
Hemoglobin: 14.6 g/dL (ref 13.0–18.0)
MCH: 29.4 pg (ref 26.0–34.0)
MCHC: 33.8 g/dL (ref 32.0–36.0)
MCV: 87 fL (ref 80.0–100.0)
Platelets: 150 10*3/uL (ref 150–440)
RBC: 4.95 MIL/uL (ref 4.40–5.90)
RDW: 13.8 % (ref 11.5–14.5)
WBC: 21.8 10*3/uL — ABNORMAL HIGH (ref 3.8–10.6)

## 2015-06-17 LAB — BASIC METABOLIC PANEL
Anion gap: 11 (ref 5–15)
BUN: 16 mg/dL (ref 6–20)
CO2: 22 mmol/L (ref 22–32)
Calcium: 8.4 mg/dL — ABNORMAL LOW (ref 8.9–10.3)
Chloride: 99 mmol/L — ABNORMAL LOW (ref 101–111)
Creatinine, Ser: 1.42 mg/dL — ABNORMAL HIGH (ref 0.61–1.24)
GFR calc Af Amer: >60 mL/min (ref >60)
GFR calc non Af Amer: 55 mL/min — ABNORMAL LOW (ref >60)
Glucose, Bld: 236 mg/dL — ABNORMAL HIGH (ref 65–99)
Potassium: 3.8 mmol/L (ref 3.5–5.1)
Sodium: 132 mmol/L — ABNORMAL LOW (ref 135–145)

## 2015-06-17 LAB — LACTIC ACID, PLASMA
Lactic Acid, Venous: 2.2 mmol/L (ref 0.5–2.0)
Lactic Acid, Venous: 2.1 mmol/L (ref 0.5–2.0)

## 2015-06-17 LAB — PROCALCITONIN: Procalcitonin: 3.49 ng/mL

## 2015-06-17 LAB — TROPONIN I
Troponin I: <0.03 ng/mL (ref <0.031)
Troponin I: 0.05 ng/mL — ABNORMAL HIGH (ref <0.031)
Troponin I: 0.06 ng/mL — ABNORMAL HIGH
Troponin I: 0.03 ng/mL

## 2015-06-17 MED ORDER — PNEUMOCOCCAL VAC POLYVALENT 25 MCG/0.5ML IJ INJ
0.5000 mL | INJECTION | INTRAMUSCULAR | Status: AC
Start: 2015-06-18 — End: 2015-06-20
  Administered 2015-06-20: 0.5 mL via INTRAMUSCULAR
  Filled 2015-06-17: qty 0.5

## 2015-06-17 MED ORDER — MORPHINE SULFATE (PF) 4 MG/ML IV SOLN
4.0000 mg | INTRAVENOUS | Status: DC | PRN
  Administered 2015-06-17 (×3): 4 mg via INTRAVENOUS
  Filled 2015-06-17 (×3): qty 1

## 2015-06-17 MED ORDER — DIPHENHYDRAMINE HCL 25 MG PO CAPS
25.0000 mg | ORAL_CAPSULE | Freq: Every evening | ORAL | Status: DC | PRN
Start: 2015-06-17 — End: 2015-06-21
  Administered 2015-06-17: 25 mg via ORAL
  Filled 2015-06-17: qty 1

## 2015-06-17 MED ORDER — DEXTROSE 5 % IV SOLN
1.0000 g | Freq: Three times a day (TID) | INTRAVENOUS | Status: DC
  Administered 2015-06-17 – 2015-06-18 (×4): 1 g via INTRAVENOUS
  Filled 2015-06-17 (×6): qty 1

## 2015-06-17 MED ORDER — NITROGLYCERIN 2 % TD OINT
1.0000 [in_us] | TOPICAL_OINTMENT | Freq: Four times a day (QID) | TRANSDERMAL | Status: DC
  Administered 2015-06-17 – 2015-06-18 (×3): 1 [in_us] via TOPICAL
  Filled 2015-06-17 (×5): qty 1

## 2015-06-17 MED ORDER — ALUM & MAG HYDROXIDE-SIMETH 200-200-20 MG/5ML PO SUSP
30.0000 mL | ORAL | Status: DC | PRN
  Administered 2015-06-17: 30 mL via ORAL
  Filled 2015-06-17: qty 30

## 2015-06-17 NOTE — Progress Notes (Signed)
Pharmacy Antibiotic Note  Anthony Fuller is a 55 y.o. male admitted on 06/16/2015 with urosepsis.  Pharmacy has been consulted for cefepime dosing.  Plan: Continue Cefepime 1 g IV q8 hours.   Height: 6\' 2"  (188 cm) Weight: 260 lb (117.935 kg) IBW/kg (Calculated) : 82.2  Temp (24hrs), Avg:100.4 F (38 C), Min:97.8 F (36.6 C), Max:103.1 F (39.5 C)   Recent Labs Lab 06/16/15 1420 06/17/15 0151  WBC 18.0* 21.8*  CREATININE 1.36* 1.42*  LATICACIDVEN  --  2.2*    Estimated Creatinine Clearance: 83.1 mL/min (by C-G formula based on Cr of 1.42).    Allergies  Allergen Reactions  . Hydromorphone Other (See Comments)    Reaction:  Drops pts BP   . Floxin [Ofloxacin] Rash     Thank you for allowing pharmacy to be a part of this patient's care.  Tida Saner D, Pharm.D., BCPS Clinical Pharmacist 06/17/2015 9:41 AM

## 2015-06-17 NOTE — Progress Notes (Signed)
Inpatient Diabetes Program Recommendations  AACE/ADA: New Consensus Statement on Inpatient Glycemic Control (2015)  Target Ranges:  Prepandial:   less than 140 mg/dL      Peak postprandial:   less than 180 mg/dL (1-2 hours)      Critically ill patients:  140 - 180 mg/dL  Results for ERCEL, SEPTER (MRN [id number]) as of 06/17/2015 15:13  Ref. Range 06/16/2015 23:46 06/17/2015 08:03 06/17/2015 11:10  Glucose-Capillary Latest Ref Range: 65-99 mg/dL 197 (H) 205 (H)  Novolog 3 units 209 (H)  Novolog 3 units   Review of Glycemic Control  Diabetes history: DM2 Outpatient Diabetes medications: Jardiance 10 mg QAM, Metformin XR 500 QPM Current orders for Inpatient glycemic control: Novolog 0-9 units TID with meals, Novolog 0-5 units QHS  Inpatient Diabetes Program Recommendations: Correction (SSI): May want to consider increasing Novolog correction to moderate scale.  Thanks, Barnie Alderman, RN, MSN, CDE Diabetes Coordinator Inpatient Diabetes Program [phone] (Team Pager from [place] to [place]) [phone] ([facility] office) [phone] [facility] office) [phone] [facility] office)

## 2015-06-17 NOTE — Progress Notes (Signed)
Dr Neoma Laming returned page.  He will see the patient tomorrow.  He said that the patient does not have any blockages and that he has nothing wrong from a coronary standpoint and that is why he sent the patient to here to the hospital. I asked what he recommended for the chest pain/tightness and he said something for reflux might help. Dr Bridgett Larsson paged about this and he ordered maalox prn

## 2015-06-17 NOTE — Progress Notes (Signed)
Lactic acid 2.1.  Dr Verdell Carmine notified.  No new orders at this time but he is going to see the pt now

## 2015-06-17 NOTE — Consult Note (Signed)
@[facility]CDATE @ 9:32 AM   Anthony Fuller 22-Sep-1960 [id number]  Referring provider: Dr. Bobetta Lime  Chief Complaint  Patient presents with  . Chest Pain    HPI: The patient is a 55 year old male with a solitary left kidney status post ureteroscopy with indwelling left ureteral stent and recent pyelonephritis presented to the hospital with chest pain. During his workup, he was found to be febrile with leukocytosis. The patient states that his main concern at this time as his chest pain. However, he has noted over the last 2-3 days that he has developed dysuria as well as left flank pain. He notes night sweats as well. In terms of his left ureteral stent, he was due for stent removal last month however he developed pyelonephritis prior to removal. At that time, he had positive urine cultures for enterococcus that was pansensitive except for resistance to tetracycline. He was treated for this appropriately. He had negative urine culture 7 days ago and was to be scheduled for left ureteral stent removal in the office. He however developed this chest pain in the interim and presented to the hospital.   PMH: Past Medical History  Diagnosis Date  . Arthritis   . GERD (gastroesophageal reflux disease)   . Hypertension   . Diabetes mellitus without complication ([place])   . Cancer ([place]) 2012    R kidney removed  . 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  Marland Kitchen Anxiety     Surgical History: Past Surgical History  Procedure Laterality Date  . Spine surgery      lumbar 2013  . Kidney removed Right     2012  . Back surgery    . Nephrectomy Right 04/2011  . Ureteroscopy with holmium laser lithotripsy Left 05/18/2015    Procedure: URETEROSCOPY WITH HOLMIUM LASER LITHOTRIPSY;  Surgeon: Nickie Retort, MD;  Location: [facility] ORS;  Service: Urology;  Laterality: Left;  . Cystoscopy w/ retrogrades Left 05/18/2015    Procedure: CYSTOSCOPY WITH RETROGRADE PYELOGRAM;  Surgeon: Nickie Retort, MD;  Location: [facility] ORS;  Service: Urology;  Laterality: Left;  . Cystoscopy with stent placement Left 05/18/2015    Procedure: CYSTOSCOPY WITH STENT PLACEMENT;  Surgeon: Nickie Retort, MD;  Location: [facility] ORS;  Service: Urology;  Laterality: Left;    Home Medications:    Medication List    ASK your doctor about these medications        amoxicillin-clavulanate 875-125 MG tablet  Commonly known as:  AUGMENTIN  Take 1 tablet by mouth every 12 (twelve) hours.     citalopram 20 MG tablet  Commonly known as:  CELEXA  Take 20 mg by mouth at bedtime.     dicyclomine 10 MG capsule  Commonly known as:  BENTYL  Take 10 mg by mouth 3 (three) times daily as needed for spasms.     docusate sodium 100 MG capsule  Commonly known as:  COLACE  Take 1 capsule (100 mg total) by mouth 2 (two) times daily.     empagliflozin 10 MG Tabs tablet  Commonly known as:  JARDIANCE  Take 10 mg by mouth every morning.     HYDROcodone-acetaminophen 5-325 MG tablet  Commonly known as:  NORCO/VICODIN  Take 2 tablets by mouth every 4 (four) hours as needed for moderate pain.     loratadine 10 MG tablet  Commonly known as:  CLARITIN  Take 10 mg by mouth daily as needed for allergies.  metFORMIN 500 MG 24 hr tablet  Commonly known as:  GLUCOPHAGE-XR  Take 500 mg by mouth daily with supper.     omeprazole 40 MG capsule  Commonly known as:  PRILOSEC  Take 40 mg by mouth at bedtime.     pregabalin 150 MG capsule  Commonly known as:  LYRICA  Take 150 mg by mouth at bedtime.     pregabalin 50 MG capsule  Commonly known as:  LYRICA  Take 50 mg by mouth every morning.     senna 8.6 MG Tabs tablet  Commonly known as:  SENOKOT  Take 1 tablet (8.6 mg total) by mouth daily.     tamsulosin 0.4 MG Caps capsule  Commonly known as:  FLOMAX  Take 0.4 mg by mouth at bedtime.     terbinafine 250 MG tablet  Commonly known as:  LAMISIL  Take 250 mg by mouth at bedtime.     tiZANidine 4 MG  tablet  Commonly known as:  ZANAFLEX  Take 4-8 mg by mouth 3 (three) times daily as needed for muscle spasms.     traMADol 50 MG tablet  Commonly known as:  ULTRAM  Take 50 mg by mouth every 6 (six) hours as needed for moderate pain. for pain        Allergies:  Allergies  Allergen Reactions  . Hydromorphone Other (See Comments)    Reaction:  Drops pts BP   . Floxin [Ofloxacin] Rash    Family History: Family History  Problem Relation Age of Onset  . Diabetes Mother   . Hypertension Mother   . Asthma Mother   . Heart disease Father   . Kidney disease Father   . Diabetes Father   . Stroke Father     Social History:  reports that he has never smoked. He has never used smokeless tobacco. He reports that he does not drink alcohol or use illicit drugs.  ROS: 12 point ROS otherwise negative except for HPI                                        Physical Exam: BP 100/62 mmHg  Pulse 64  Temp(Src) 97.8 F (36.6 C) (Oral)  Resp 18  Ht 6\' 2"  (1.88 m)  Wt 260 lb (117.935 kg)  BMI 33.37 kg/m2  SpO2 97%  Constitutional:  Alert and oriented, No acute distress. HEENT: [place] AT, moist mucus membranes.  Trachea midline, no masses. Cardiovascular: No clubbing, cyanosis, or edema. Respiratory: Normal respiratory effort, no increased work of breathing. GI: Abdomen is soft, nontender, nondistended, no abdominal masses GU: Left CVA tenderness to palpation Skin: No rashes, bruises or suspicious lesions. Lymph: No cervical or inguinal adenopathy. Neurologic: Grossly intact, no focal deficits, moving all 4 extremities. Psychiatric: Normal mood and affect.  Laboratory Data: Lab Results  Component Value Date   WBC 21.8* 06/17/2015   HGB 14.6 06/17/2015   HCT 43.1 06/17/2015   MCV 87.0 06/17/2015   PLT 150 06/17/2015    Lab Results  Component Value Date   CREATININE 1.42* 06/17/2015    No results found for: PSA  No results found for: TESTOSTERONE  Lab  Results  Component Value Date   HGBA1C 7.4* 05/27/2015    Urinalysis    Component Value Date/Time   COLORURINE YELLOW* 05/27/2015 1135   COLORURINE Yellow 05/31/2014 0637   APPEARANCEUR CLEAR* 05/27/2015 1135  APPEARANCEUR Turbid 05/31/2014 0637   LABSPEC 1.024 05/27/2015 1135   LABSPEC 1.029 05/31/2014 0637   PHURINE 6.0 05/27/2015 1135   PHURINE 5.0 05/31/2014 0637   GLUCOSEU Trace* 06/10/2015 0816   GLUCOSEU 50 mg/dL 05/31/2014 0637   HGBUR 3+* 05/27/2015 1135   HGBUR Negative 05/31/2014 0637   BILIRUBINUR Negative 06/10/2015 0816   BILIRUBINUR NEGATIVE 05/27/2015 1135   BILIRUBINUR Negative 05/31/2014 0637   KETONESUR NEGATIVE 05/27/2015 1135   KETONESUR Negative 05/31/2014 0637   PROTEINUR 30* 05/27/2015 1135   PROTEINUR 30 mg/dL 05/31/2014 0637   NITRITE Negative 06/10/2015 0816   NITRITE NEGATIVE 05/27/2015 1135   NITRITE Negative 05/31/2014 0637   LEUKOCYTESUR Trace* 06/10/2015 0816   LEUKOCYTESUR 1+* 05/27/2015 1135   LEUKOCYTESUR Negative 05/31/2014 0637     Assessment & Plan:    1. Possible recurrent left pyelonephritis in solitary kidney -Follow up urine culture/blood culture -Continue antibiotics pending culture and sensitivity results -Check renal ultrasound ensure no hydronephrosis or stent migration  2. Retained left ureteral stent -Will plan for outpatient cystoscopy with stent removal after negative urine culture  3. Chest pain -management per cardiology  Nickie Retort, [facility] [address] [place], [place] [address] [phone]

## 2015-06-17 NOTE — Progress Notes (Signed)
Anthony Fuller is a 55 y.o. 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 He was seen in my office first where he was complaining of chest pain diaphoresis and sweating and was found to have 102.6 temperature along with chest pain. EKG had nonspecific ST ST changes in the inferior leads. He had a CTA coronaries done in the office which showed calcium score 0 with minor luminal irregularities with right dominant system.   Review of Systems: Still has intermittent chest pain no orthopnea PND   Past Medical History  Diagnosis Date  . Arthritis   . GERD (gastroesophageal reflux disease)   . Hypertension   . Diabetes mellitus without complication ([place])   . Cancer ([place]) 2012    R kidney removed  . 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  Marland Kitchen Anxiety     Medications Prior to Admission  Medication Sig Dispense Refill  . citalopram (CELEXA) 20 MG tablet Take 20 mg by mouth at bedtime.     . dicyclomine (BENTYL) 10 MG capsule Take 10 mg by mouth 3 (three) times daily as needed for spasms.     . empagliflozin (JARDIANCE) 10 MG TABS tablet Take 10 mg by mouth every morning.    Marland Kitchen HYDROcodone-acetaminophen (NORCO/VICODIN) 5-325 MG tablet Take 2 tablets by mouth every 4 (four) hours as needed for moderate pain. 40 tablet 0  . loratadine (CLARITIN) 10 MG tablet Take 10 mg by mouth daily as needed for allergies.     . metFORMIN (GLUCOPHAGE-XR) 500 MG 24 hr tablet Take 500 mg by mouth daily with supper.    Marland Kitchen omeprazole (PRILOSEC) 40 MG capsule Take 40 mg by mouth at bedtime.     . pregabalin (LYRICA) 150 MG capsule Take 150 mg by mouth at bedtime.     . pregabalin (LYRICA) 50 MG capsule Take 50 mg by mouth every morning.     . senna  (SENOKOT) 8.6 MG TABS tablet Take 1 tablet (8.6 mg total) by mouth daily. (Patient taking differently: Take 1 tablet by mouth at bedtime. ) 120 each 0  . tamsulosin (FLOMAX) 0.4 MG CAPS capsule Take 0.4 mg by mouth at bedtime.     . terbinafine (LAMISIL) 250 MG tablet Take 250 mg by mouth at bedtime.     Marland Kitchen tiZANidine (ZANAFLEX) 4 MG tablet Take 4-8 mg by mouth 3 (three) times daily as needed for muscle spasms.    . traMADol (ULTRAM) 50 MG tablet Take 50 mg by mouth every 6 (six) hours as needed for moderate pain. for pain  1  . amoxicillin-clavulanate (AUGMENTIN) 875-125 MG tablet Take 1 tablet by mouth every 12 (twelve) hours. (Patient not taking: Reported on 06/16/2015) 28 tablet 0  . docusate sodium (COLACE) 100 MG capsule Take 1 capsule (100 mg total) by mouth 2 (two) times daily. (Patient not taking: Reported on 06/16/2015) 10 capsule 0     . ceFEPime (MAXIPIME) IV  1 g Intravenous 3 times per day  . citalopram  20 mg Oral QHS  . heparin  5,000 Units Subcutaneous 3 times per day  . insulin aspart  0-5 Units Subcutaneous QHS  . insulin aspart  0-9 Units Subcutaneous TID WC  . metoprolol tartrate  25 mg  Oral BID  . nitroGLYCERIN  1 inch Topical 4 times per day  . pantoprazole  40 mg Oral QAC breakfast  . [START ON 06/18/2015] pneumococcal 23 valent vaccine  0.5 mL Intramuscular Tomorrow-1000  . pregabalin  150 mg Oral QHS  . pregabalin  50 mg Oral BH-[id number]  . tamsulosin  0.4 mg Oral QHS  . terbinafine  250 mg Oral QHS    Infusions: . sodium chloride 100 mL/hr at 06/17/15 2015    Allergies  Allergen Reactions  . Hydromorphone Other (See Comments)    Reaction:  Drops pts BP   . Floxin [Ofloxacin] Rash    Social History   Social History  . Marital Status: Married    Spouse Name: N/A  . Number of Children: N/A  . Years of Education: N/A   Occupational History  . Not on file.   Social History Main Topics  . Smoking status: Never Smoker   . Smokeless tobacco: Never Used  .  Alcohol Use: No  . Drug Use: No  . Sexual Activity: Yes   Other Topics Concern  . Not on file   Social History Narrative    Family History  Problem Relation Age of Onset  . Diabetes Mother   . Hypertension Mother   . Asthma Mother   . Heart disease Father   . Kidney disease Father   . Diabetes Father   . Stroke Father     PHYSICAL EXAM: Filed Vitals:   06/17/15 1531 06/17/15 1900  BP: 108/76 108/72  Pulse: 65 80  Temp: 98.4 F (36.9 C) 98.2 F (36.8 C)  Resp: 18 18     Intake/Output Summary (Last 24 hours) at 06/17/15 2033 Last data filed at 06/17/15 1901  Gross per 24 hour  Intake 2416.67 ml  Output      0 ml  Net 2416.67 ml    General:  Well appearing. No respiratory difficulty HEENT: normal Neck: supple. no JVD. Carotids 2+ bilat; no bruits. No lymphadenopathy or thryomegaly appreciated. Cor: PMI nondisplaced. Regular rate & rhythm. No rubs, gallops or murmurs. Lungs: clear Abdomen: soft, nontender, nondistended. No hepatosplenomegaly. No bruits or masses. Good bowel sounds. Extremities: no cyanosis, clubbing, rash, edema Neuro: alert & oriented x 3, cranial nerves grossly intact. moves all 4 extremities w/o difficulty. Affect pleasant.  ECG: Sinus rhythm with ST changes in the inferior leads   Results for orders placed or performed during the hospital encounter of 06/16/15 (from the past 24 hour(s))  Glucose, capillary     Status: Abnormal   Collection Time: 06/16/15 11:46 PM  Result Value Ref Range   Glucose-Capillary 197 (H) 65 - 99 mg/dL   Comment 1 Notify RN   Troponin I     Status: None   Collection Time: 06/17/15  1:51 AM  Result Value Ref Range   Troponin I <0.03 <0.031 ng/mL  Basic metabolic panel     Status: Abnormal   Collection Time: 06/17/15  1:51 AM  Result Value Ref Range   Sodium 132 (L) 135 - 145 mmol/L   Potassium 3.8 3.5 - 5.1 mmol/L   Chloride 99 (L) 101 - 111 mmol/L   CO2 22 22 - 32 mmol/L   Glucose, Bld 236 (H) 65 - 99  mg/dL   BUN 16 6 - 20 mg/dL   Creatinine, Ser 1.42 (H) 0.61 - 1.24 mg/dL   Calcium 8.4 (L) 8.9 - 10.3 mg/dL   GFR calc non Af Amer 55 (L) >60 mL/min  GFR calc Af Amer >60 >60 mL/min   Anion gap 11 5 - 15  CBC     Status: Abnormal   Collection Time: 06/17/15  1:51 AM  Result Value Ref Range   WBC 21.8 (H) 3.8 - 10.6 K/uL   RBC 4.95 4.40 - 5.90 MIL/uL   Hemoglobin 14.6 13.0 - 18.0 g/dL   HCT 43.1 40.0 - 52.0 %   MCV 87.0 80.0 - 100.0 fL   MCH 29.4 26.0 - 34.0 pg   MCHC 33.8 32.0 - 36.0 g/dL   RDW 13.8 11.5 - 14.5 %   Platelets 150 150 - 440 K/uL  Lactic acid, plasma     Status: Abnormal   Collection Time: 06/17/15  1:51 AM  Result Value Ref Range   Lactic Acid, Venous 2.2 (HH) 0.5 - 2.0 mmol/L  Procalcitonin     Status: None   Collection Time: 06/17/15  1:51 AM  Result Value Ref Range   Procalcitonin 3.49 ng/mL  Protime-INR     Status: None   Collection Time: 06/17/15  1:51 AM  Result Value Ref Range   Prothrombin Time 14.9 11.4 - 15.0 seconds   INR 1.15   APTT     Status: None   Collection Time: 06/17/15  1:51 AM  Result Value Ref Range   aPTT 25 24 - 36 seconds  Troponin I     Status: Abnormal   Collection Time: 06/17/15  5:29 AM  Result Value Ref Range   Troponin I 0.05 (H) <0.031 ng/mL  Glucose, capillary     Status: Abnormal   Collection Time: 06/17/15  8:03 AM  Result Value Ref Range   Glucose-Capillary 205 (H) 65 - 99 mg/dL   Comment 1 Notify RN   Lactic acid, plasma     Status: Abnormal   Collection Time: 06/17/15  9:20 AM  Result Value Ref Range   Lactic Acid, Venous 2.1 (HH) 0.5 - 2.0 mmol/L  Glucose, capillary     Status: Abnormal   Collection Time: 06/17/15 11:10 AM  Result Value Ref Range   Glucose-Capillary 209 (H) 65 - 99 mg/dL   Comment 1 Notify RN   Glucose, capillary     Status: Abnormal   Collection Time: 06/17/15  4:31 PM  Result Value Ref Range   Glucose-Capillary 168 (H) 65 - 99 mg/dL   Comment 1 Notify RN   Troponin I     Status:  Abnormal   Collection Time: 06/17/15  7:25 PM  Result Value Ref Range   Troponin I 0.06 (H) <0.031 ng/mL   US Renal  06/17/2015  CLINICAL DATA:  Retained stent.  Right nephrectomy. EXAM: RENAL / URINARY TRACT ULTRASOUND COMPLETE COMPARISON:  05/27/2015 FINDINGS: Right Kidney: Absent consistent with given history. Left Kidney: Length: 14.0 cm. Echogenicity within normal limits. No mass or hydronephrosis visualized. Ureteral stent is visualized in the renal pelvis. Bladder: Appears normal for degree of bladder distention. 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  There is no left hydronephrosis. There is compensatory hypertrophy of the left kidney. Electronically Signed   By: Marybelle Killings M.D.   On: 06/17/2015 16:18   Dg Chest Port 1 View  06/16/2015  CLINICAL DATA:  Chest pain beginning in early January, 2017 which has worsened over the past 2 days. Initial encounter. EXAM: PORTABLE CHEST 1 VIEW COMPARISON:  Single view of the chest 01/16/2015. CT chest 04/03/2012. FINDINGS: The lungs are clear.  Heart size is normal. No pneumothorax or pleural effusion. IMPRESSION: Negative chest Electronically Signed   By: Inge Rise M.D.   On: 06/16/2015 18:54     ASSESSMENT AND PLAN: Atypical chest pain with mildly elevated troponin and CTA coronaries showing 0 calcium score and minor luminal irregularities. I think the chest pain is musculoskeletal but patient is adamant that he has severe pressure in the chest relieved with nitroglycerin. Advise starting nitro paste 1 inch every 6 hours and advise giving the patient sedative to sleep. There is some tenderness over his left chest and this could be musculoskeletal chest pain. I reassured the patient. But if he continues to have chest pain will have to do cardiac catheterization. I am reluctant to give him more dye because of creatinine being 1.42 and only 1 kidney. But if he continues to have  chest pain will give hydration overnight and do catheterization in the morning. His echocardiogram in the office showed normal left reticular systolic function with ejection fraction 70% and mild mitral regurgitation mild diastolic dysfunction.  Jaimey Franchini A

## 2015-06-17 NOTE — Progress Notes (Signed)
Notified Dr Jannifer Franklin pt continues to have chest pain with no relief from pain medication ordered

## 2015-06-17 NOTE — Progress Notes (Signed)
Pt having pain in neck and shoulders.  k pad ordered

## 2015-06-17 NOTE — Progress Notes (Signed)
Troponin 0.05. Dr Jannifer Franklin notified

## 2015-06-17 NOTE — Progress Notes (Signed)
Pt requesting medicine to help him sleep. Dr. Jannifer Franklin to place orders.

## 2015-06-17 NOTE — Progress Notes (Signed)
Pt says he is having pain in his neck and shoulder that is new. He is also having chest pain.  BP is low.  Dr Verdell Carmine said not to give metoprolol this am.  He did not want a cardiology consult ordered

## 2015-06-17 NOTE — Progress Notes (Signed)
Pt said he is having the chest pain but it is worse. He is also short of breath with worse tightness.  Dr Verdell Carmine was notified of this earlier today but did not want to order cardiology consult at that time.  Dr Margaretmary Eddy is now on and said to order cardio consult and stat troponins

## 2015-06-17 NOTE — Progress Notes (Signed)
Notified Dr Jannifer Franklin of critical lactic acid of 2.2

## 2015-06-17 NOTE — Progress Notes (Signed)
[place] [facility] NAME: Anthony Fuller    MR#:  [id number]  DATE OF BIRTH:  February 07, 1961  SUBJECTIVE:   Here due to fever, flank pain and suspected to have pyelonephritis. He also was having some vague chest pain which has resolved now. Afebrile overnight.    REVIEW OF SYSTEMS:    Review of Systems  Constitutional: Negative for fever and chills.  HENT: Negative for congestion and tinnitus.   Eyes: Negative for blurred vision and double vision.  Respiratory: Negative for cough, shortness of breath and wheezing.   Cardiovascular: Negative for chest pain, orthopnea and PND.  Gastrointestinal: Positive for abdominal pain (left-sided flank). Negative for nausea, vomiting and diarrhea.  Genitourinary: Negative for dysuria and hematuria.  Neurological: Negative for dizziness, sensory change and focal weakness.  All other systems reviewed and are negative.   Nutrition: Heart Health/Carb control Tolerating Diet: yes Tolerating PT: ambulatory  DRUG ALLERGIES:   Allergies  Allergen Reactions  . Hydromorphone Other (See Comments)    Reaction:  Drops pts BP   . Floxin [Ofloxacin] Rash    VITALS:  Blood pressure 100/62, pulse 64, temperature 97.8 F (36.6 C), temperature source Oral, resp. rate 18, height 6\' 2"  (1.88 m), weight 117.935 kg (260 lb), SpO2 97 %.  PHYSICAL EXAMINATION:   Physical Exam  GENERAL:  55 y.o.-year-old obese patient lying in the bed with no acute distress.  EYES: Pupils equal, round, reactive to light and accommodation. No scleral icterus. Extraocular muscles intact.  HEENT: Head atraumatic, normocephalic. Oropharynx and nasopharynx clear.  NECK:  Supple, no jugular venous distention. No thyroid enlargement, no tenderness.  LUNGS: Normal breath sounds bilaterally, no wheezing, rales, rhonchi. No use of accessory muscles of respiration.  CARDIOVASCULAR: S1, S2 normal. No murmurs, rubs, or gallops.  ABDOMEN:  Soft, left side lower back/flank tenderness, no rebound, rigidity. nondistended. Bowel sounds present. No organomegaly or mass.  EXTREMITIES: No cyanosis, clubbing or edema b/l.    NEUROLOGIC: Cranial nerves II through XII are intact. No focal Motor or sensory deficits b/l.   PSYCHIATRIC: The patient is alert and oriented x 3. Good affect.  SKIN: No obvious rash, lesion, or ulcer.    LABORATORY PANEL:   CBC  Recent Labs Lab 06/17/15 0151  WBC 21.8*  HGB 14.6  HCT 43.1  PLT 150   ------------------------------------------------------------------------------------------------------------------  Chemistries   Recent Labs Lab 06/17/15 0151  NA 132*  K 3.8  CL 99*  CO2 22  GLUCOSE 236*  BUN 16  CREATININE 1.42*  CALCIUM 8.4*   ------------------------------------------------------------------------------------------------------------------  Cardiac Enzymes  Recent Labs Lab 06/17/15 0529  TROPONINI 0.05*   ------------------------------------------------------------------------------------------------------------------  RADIOLOGY:  Dg Chest Port 1 View  06/16/2015  CLINICAL DATA:  Chest pain beginning in early January, 2017 which has worsened over the past 2 days. Initial encounter. EXAM: PORTABLE CHEST 1 VIEW COMPARISON:  Single view of the chest 01/16/2015. CT chest 04/03/2012. FINDINGS: The lungs are clear. Heart size is normal. No pneumothorax or pleural effusion. IMPRESSION: Negative chest Electronically Signed   By: Inge Rise M.D.   On: 06/16/2015 18:54     ASSESSMENT AND PLAN:   55 year old male with past medical history of hypertension, type 2 diabetes without complication, history of nephrolithiasis status post stent placement, anxiety who presented to the hospital due to fever of 101 and noted to have a leukocytosis with left-sided flank pain.  #1 sepsis-this was the working diagnosis given the patient's leukocytosis, fever,  elevated lactic  acid. -The sources likely urinary nature and suspected pyelonephritis. -Continue IV cefepime, follow blood, urine cultures. -Follow fever curve, currently hemodynamically stable.  #2 acute pyelonephritis-continue IV cefepime, IV fluids, pain control, antiemetics. -  follow urine, blood cultures. -Status post recent ureteral stent on the left. Seen by urology and plan to remove the stent if the urine cultures remained negative. -Follow renal ultrasound.  #3 leukocytosis-due to the sepsis. Follow white cell count with IV antibiotic therapy.  #4 chest pain-vague and likely musculoskeletal and noncardiac in nature. -Cardiac markers 3 have been negative.  #5 type 2 diabetes without combination-continue sliding scale insulin.  #6 GERD-continue Protonix.  #7 hypertension-continue metoprolol.  #8 diabetic neuropathy-continue Lyrica.  All the records are reviewed and case discussed with Care Management/Social Workerr. Management plans discussed with the patient, family and they are in agreement.  CODE STATUS: Full  DVT Prophylaxis: Heparin subcutaneous  TOTAL TIME TAKING CARE OF THIS PATIENT: 30 minutes.   POSSIBLE D/C IN 2-3 DAYS, DEPENDING ON CLINICAL CONDITION.   Henreitta Leber M.D on 06/17/2015 at 3:03 PM  Between 7am to 6pm - Pager - [phone]  After 6pm go to [email or web address] - password [email or web address] [facility]  Office  [phone]  CC: Primary care physician; Valera Castle, MD

## 2015-06-18 DIAGNOSIS — B9620 Unspecified Escherichia coli [E. coli] as the cause of diseases classified elsewhere: Secondary | ICD-10-CM

## 2015-06-18 DIAGNOSIS — Q600 Renal agenesis, unilateral: Secondary | ICD-10-CM

## 2015-06-18 DIAGNOSIS — Z960 Presence of urogenital implants: Secondary | ICD-10-CM

## 2015-06-18 DIAGNOSIS — N12 Tubulo-interstitial nephritis, not specified as acute or chronic: Secondary | ICD-10-CM

## 2015-06-18 DIAGNOSIS — N390 Urinary tract infection, site not specified: Secondary | ICD-10-CM

## 2015-06-18 LAB — GLUCOSE, CAPILLARY
Glucose-Capillary: 140 mg/dL — ABNORMAL HIGH (ref 65–99)
Glucose-Capillary: 197 mg/dL — ABNORMAL HIGH (ref 65–99)
Glucose-Capillary: 227 mg/dL — ABNORMAL HIGH (ref 65–99)
Glucose-Capillary: 325 mg/dL — ABNORMAL HIGH (ref 65–99)
Glucose-Capillary: 327 mg/dL — ABNORMAL HIGH (ref 65–99)

## 2015-06-18 LAB — CBC
HCT: 35.8 % — ABNORMAL LOW (ref 40.0–52.0)
Hemoglobin: 12 g/dL — ABNORMAL LOW (ref 13.0–18.0)
MCH: 29.7 pg (ref 26.0–34.0)
MCHC: 33.4 g/dL (ref 32.0–36.0)
MCV: 88.9 fL (ref 80.0–100.0)
Platelets: 113 10*3/uL — ABNORMAL LOW (ref 150–440)
RBC: 4.03 MIL/uL — ABNORMAL LOW (ref 4.40–5.90)
RDW: 13.8 % (ref 11.5–14.5)
WBC: 12.5 10*3/uL — ABNORMAL HIGH (ref 3.8–10.6)

## 2015-06-18 LAB — BASIC METABOLIC PANEL
Anion gap: 5 (ref 5–15)
BUN: 19 mg/dL (ref 6–20)
CO2: 26 mmol/L (ref 22–32)
Calcium: 7.8 mg/dL — ABNORMAL LOW (ref 8.9–10.3)
Chloride: 102 mmol/L (ref 101–111)
Creatinine, Ser: 1.23 mg/dL (ref 0.61–1.24)
GFR calc Af Amer: >60 mL/min (ref >60)
GFR calc non Af Amer: >60 mL/min (ref >60)
Glucose, Bld: 171 mg/dL — ABNORMAL HIGH (ref 65–99)
Potassium: 3.6 mmol/L (ref 3.5–5.1)
Sodium: 133 mmol/L — ABNORMAL LOW (ref 135–145)

## 2015-06-18 LAB — TROPONIN I: Troponin I: 0.03 ng/mL (ref <0.031)

## 2015-06-18 MED ORDER — METHYLPREDNISOLONE SODIUM SUCC 40 MG IJ SOLR
40.0000 mg | Freq: Four times a day (QID) | INTRAMUSCULAR | Status: AC
  Administered 2015-06-18 – 2015-06-19 (×3): 40 mg via INTRAVENOUS
  Filled 2015-06-18 (×3): qty 1

## 2015-06-18 MED ORDER — SODIUM CHLORIDE 0.9 % IV SOLN
3.0000 g | Freq: Four times a day (QID) | INTRAVENOUS | Status: DC
  Administered 2015-06-18 – 2015-06-21 (×11): 3 g via INTRAVENOUS
  Filled 2015-06-18 (×15): qty 3

## 2015-06-18 MED ORDER — VANCOMYCIN HCL IN DEXTROSE 1-5 GM/200ML-% IV SOLN
1000.0000 mg | Freq: Once | INTRAVENOUS | Status: AC
  Administered 2015-06-18: 1000 mg via INTRAVENOUS
  Filled 2015-06-18: qty 200

## 2015-06-18 NOTE — Progress Notes (Signed)
Urology Inpatient Progress Report Sepsis, due to unspecified organism ([place]) [A41.9]    Intv/Subj: No acute events overnight. Patient continues to have substernal chest pain, colicky in nature, tender to palpation with no improvement on his nitroglycerin paste. Patient was also complaining of left flank pain. Renal ultrasound demonstrated no hydronephrosis. Urine culture is growing gram-positive cocci. Afebrile since admission   Past Medical History  Diagnosis Date  . Arthritis   . GERD (gastroesophageal reflux disease)   . Hypertension   . Diabetes mellitus without complication ([place])   . Cancer ([place]) 2012    R kidney removed  . 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  Marland Kitchen Anxiety    Current Facility-Administered Medications  Medication Dose Route Frequency Provider Last Rate Last Dose  . 0.9 %  sodium chloride infusion   Intravenous Continuous Demetrios Loll, MD 100 mL/hr at 06/18/15 0702    . acetaminophen (TYLENOL) tablet 650 mg  650 mg Oral Q6H PRN Demetrios Loll, MD   650 mg at 06/17/15 [id number]   Or  . acetaminophen (TYLENOL) suppository 650 mg  650 mg Rectal Q6H PRN Demetrios Loll, MD      . albuterol (PROVENTIL) (2.5 MG/3ML) 0.083% nebulizer solution 2.5 mg  2.5 mg Nebulization Q2H PRN Demetrios Loll, MD      . alum & mag hydroxide-simeth (MAALOX/MYLANTA) 200-200-20 MG/5ML suspension 30 mL  30 mL Oral Q4H PRN Demetrios Loll, MD   30 mL at 06/17/15 2105  . ceFEPIme (MAXIPIME) 1 g in dextrose 5 % 50 mL IVPB  1 g Intravenous 3 times per day Demetrios Loll, MD   1 g at 06/18/15 0903  . citalopram (CELEXA) tablet 20 mg  20 mg Oral QHS Demetrios Loll, MD   20 mg at 06/17/15 2106  . dicyclomine (BENTYL) capsule 10 mg  10 mg Oral TID PRN Demetrios Loll, MD      . diphenhydrAMINE (BENADRYL) capsule 25 mg  25 mg Oral QHS PRN Lance Coon, MD   25 mg at 06/17/15 2241  . heparin injection 5,000 Units  5,000 Units Subcutaneous 3 times per day Demetrios Loll, MD   5,000 Units at 06/18/15 0612  . HYDROcodone-acetaminophen  (NORCO/VICODIN) 5-325 MG per tablet 2 tablet  2 tablet Oral Q4H PRN Demetrios Loll, MD   2 tablet at 06/18/15 0000  . insulin aspart (novoLOG) injection 0-5 Units  0-5 Units Subcutaneous QHS Demetrios Loll, MD   0 Units at 06/17/15 0203  . insulin aspart (novoLOG) injection 0-9 Units  0-9 Units Subcutaneous TID WC Demetrios Loll, MD   1 Units at 06/18/15 0901  . loratadine (CLARITIN) tablet 10 mg  10 mg Oral Daily PRN Demetrios Loll, MD      . metoprolol tartrate (LOPRESSOR) tablet 25 mg  25 mg Oral BID Demetrios Loll, MD   25 mg at 06/18/15 0904  . morphine 4 MG/ML injection 4 mg  4 mg Intravenous Q4H PRN Lance Coon, MD   4 mg at 06/17/15 1900  . nitroGLYCERIN (NITROGLYN) 2 % ointment 1 inch  1 inch Topical 4 times per day Dionisio David, MD   1 inch at 06/18/15 0900  . ondansetron (ZOFRAN) tablet 4 mg  4 mg Oral Q6H PRN Demetrios Loll, MD       Or  . ondansetron [facility]) injection 4 mg  4 mg Intravenous Q6H PRN Demetrios Loll, MD   4 mg at 06/17/15 2030  . pantoprazole (PROTONIX) EC tablet 40 mg  40  mg Oral QAC breakfast Henreitta Leber, MD   40 mg at 06/18/15 0904  . pneumococcal 23 valent vaccine (PNU-IMMUNE) injection 0.5 mL  0.5 mL Intramuscular Tomorrow-1000 Lance Coon, MD      . pregabalin (LYRICA) capsule 150 mg  150 mg Oral QHS Demetrios Loll, MD   150 mg at 06/17/15 2106  . pregabalin (LYRICA) capsule 50 mg  50 mg Oral Beaulah Dinning, MD   50 mg at 06/18/15 0904  . tamsulosin (FLOMAX) capsule 0.4 mg  0.4 mg Oral QHS Demetrios Loll, MD   0.4 mg at 06/17/15 2106  . terbinafine (LAMISIL) tablet 250 mg  250 mg Oral QHS Demetrios Loll, MD   250 mg at 06/17/15 2106  . tiZANidine (ZANAFLEX) tablet 4-8 mg  4-8 mg Oral TID PRN Demetrios Loll, MD      . traMADol Veatrice Bourbon) tablet 50 mg  50 mg Oral Q6H PRN Henreitta Leber, MD   50 mg at 06/18/15 0905     Objective: Vital: Filed Vitals:   06/17/15 2105 06/18/15 0259 06/18/15 0536 06/18/15 0741  BP: 119/80 120/82 102/64 111/66  Pulse: 74  70 70  Temp:   98.4 F (36.9 C) 98.5 F (36.9 C)   TempSrc:   Oral Oral  Resp:   18 18  Height:      Weight:      SpO2:   96% 96%   I/Os: I/O last 3 completed shifts: In: 2885 [P.O.:480; I.V.:2355; IV Piggyback:50] Out: -   Physical Exam:  General: Patient is in no apparent distress Lungs: Normal respiratory effort, chest expands symmetrically. GI: The abdomen is soft and nontender without mass. Ext: lower extremities symmetric  Lab Results:  Recent Labs  06/16/15 1420 06/17/15 0151 06/18/15 0401  WBC 18.0* 21.8* 12.5*  HGB 15.5 14.6 12.0*  HCT 46.6 43.1 35.8*    Recent Labs  06/16/15 1420 06/17/15 0151 06/18/15 0401  NA 133* 132* 133*  K 4.0 3.8 3.6  CL 95* 99* 102  CO2 25 22 26   GLUCOSE 200* 236* 171*  BUN 15 16 19   CREATININE 1.36* 1.42* 1.23  CALCIUM 9.3 8.4* 7.8*    Recent Labs  06/17/15 0151  INR 1.15   No results for input(s): LABURIN in the last 72 hours. Results for orders placed or performed during the hospital encounter of 06/16/15  Culture, blood (routine x 2)     Status: None (Preliminary result)   Collection Time: 06/16/15  7:02 PM  Result Value Ref Range Status   Specimen Description BLOOD RIGHT WRIST  Final   Special Requests   Final    BOTTLES DRAWN AEROBIC AND ANAEROBIC  2CC AERO, 3CC ANA   Culture NO GROWTH 2 DAYS  Final   Report Status PENDING  Incomplete  Culture, blood (routine x 2)     Status: None (Preliminary result)   Collection Time: 06/16/15  8:04 PM  Result Value Ref Range Status   Specimen Description BLOOD RIGHT ASSIST CONTROL  Final   Special Requests BOTTLES DRAWN AEROBIC AND ANAEROBIC 6CC  Final   Culture NO GROWTH 2 DAYS  Final   Report Status PENDING  Incomplete  Urine culture     Status: None (Preliminary result)   Collection Time: 06/16/15  8:04 PM  Result Value Ref Range Status   Specimen Description URINE, CLEAN CATCH  Final   Special Requests NONE  Final   Culture   Final    80,000 COLONIES/ml GRAM POSITIVE COCCI IDENTIFICATION  AND SUSCEPTIBILITIES TO  FOLLOW    Report Status PENDING  Incomplete    Studies/Results: US Renal  06/17/2015  CLINICAL DATA:  Retained stent.  Right nephrectomy. EXAM: RENAL / URINARY TRACT ULTRASOUND COMPLETE COMPARISON:  05/27/2015 FINDINGS: Right Kidney: Absent consistent with given history. Left Kidney: Length: 14.0 cm. Echogenicity within normal limits. No mass or hydronephrosis visualized. Ureteral stent is visualized in the renal pelvis. Bladder: Appears normal for degree of bladder distention. 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  There is no left hydronephrosis. There is compensatory hypertrophy of the left kidney. Electronically Signed   By: Marybelle Killings M.D.   On: 06/17/2015 16:18   Dg Chest Port 1 View  06/16/2015  CLINICAL DATA:  Chest pain beginning in early January, 2017 which has worsened over the past 2 days. Initial encounter. EXAM: PORTABLE CHEST 1 VIEW COMPARISON:  Single view of the chest 01/16/2015. CT chest 04/03/2012. FINDINGS: The lungs are clear. Heart size is normal. No pneumothorax or pleural effusion. IMPRESSION: Negative chest Electronically Signed   By: Inge Rise M.D.   On: 06/16/2015 18:54    Assessment:   suspect patient likely has left-sided pyelonephritis. No evidence of hydronephrosis or obstruction.  Plan: Would treat the patient based on previous enterococcus cultures obtained and his initial admission. Once his infection has been treated for 7 days, we can remove the stent. Would recommend consideration of evaluation of his chest pain from a gastrointestinal perspective, perhaps reflux or some similar esophageal pathology. We will continue follow-up the patient is in-house.  LOS: 2 days  Ardis Hughs 06/18/2015, 10:30 AM

## 2015-06-18 NOTE — Progress Notes (Signed)
Patient ID: Anthony Fuller, male   DOB: [birth year], 55 y.o.   MRN: [id number] [facility] PROGRESS NOTE  NENAD TRETO [id number] DOB: 1960/07/07 DOA: 06/16/2015 PCP: Valera Castle, MD  HPI/Subjective: Patient with continuous chest pain. Episodes of sweating. Chest pain worse with deep breath and movement. Patient states that he is urinating okay.  Objective: Filed Vitals:   06/18/15 0741 06/18/15 1506  BP: 111/66 104/65  Pulse: 70 65  Temp: 98.5 F (36.9 C) 98.3 F (36.8 C)  Resp: 18 18    Filed Weights   06/16/15 1358  Weight: 117.935 kg (260 lb)    ROS: Review of Systems  Constitutional: Positive for diaphoresis. Negative for fever and chills.  Eyes: Negative for blurred vision.  Respiratory: Negative for cough and shortness of breath.   Cardiovascular: Positive for chest pain.  Gastrointestinal: Negative for nausea, vomiting, abdominal pain, diarrhea and constipation.  Genitourinary: Negative for dysuria.  Musculoskeletal: Negative for joint pain.  Neurological: Negative for dizziness and headaches.   Exam: Physical Exam  Constitutional: He is oriented to person, place, and time.  HENT:  Nose: No mucosal edema.  Mouth/Throat: No oropharyngeal exudate or posterior oropharyngeal edema.  Eyes: Conjunctivae, EOM and lids are normal. Pupils are equal, round, and reactive to light.  Neck: No JVD present. Carotid bruit is not present. No edema present. No thyroid mass and no thyromegaly present.  Cardiovascular: S1 normal and S2 normal.  Exam reveals no gallop.   No murmur heard. Pulses:      Dorsalis pedis pulses are 2+ on the right side, and 2+ on the left side.  Respiratory: No respiratory distress. He has no wheezes. He has no rhonchi. He has no rales.  GI: Soft. Bowel sounds are normal. There is no tenderness.  Musculoskeletal:       Right ankle: He exhibits no swelling.       Left ankle: He exhibits swelling.  Lymphadenopathy:    He has  no cervical adenopathy.  Neurological: He is alert and oriented to person, place, and time. No cranial nerve deficit.  Skin: Skin is warm. No rash noted. Nails show no clubbing.  Psychiatric: He has a normal mood and affect.      Data Reviewed: Basic Metabolic Panel:  Recent Labs Lab 06/16/15 1420 06/17/15 0151 06/18/15 0401  NA 133* 132* 133*  K 4.0 3.8 3.6  CL 95* 99* 102  CO2 25 22 26   GLUCOSE 200* 236* 171*  BUN 15 16 19   CREATININE 1.36* 1.42* 1.23  CALCIUM 9.3 8.4* 7.8*   CBC:  Recent Labs Lab 06/16/15 1420 06/17/15 0151 06/18/15 0401  WBC 18.0* 21.8* 12.5*  HGB 15.5 14.6 12.0*  HCT 46.6 43.1 35.8*  MCV 88.6 87.0 88.9  PLT 188 150 113*   Cardiac Enzymes:  Recent Labs Lab 06/17/15 0151 06/17/15 0529 06/17/15 1925 06/17/15 2318 06/18/15 0401  TROPONINI <0.03 0.05* 0.06* 0.03 0.03    CBG:  Recent Labs Lab 06/17/15 1110 06/17/15 1631 06/17/15 2055 06/18/15 0742 06/18/15 1145  GLUCAP 209* 168* 139* 140* 197*    Recent Results (from the past 240 hour(s))  Microscopic Examination     Status: Abnormal   Collection Time: 06/10/15  8:16 AM  Result Value Ref Range Status   WBC, UA 6-10 (A) 0 -  5 /hpf Final   RBC, UA >30 (H) 0 -  2 /hpf Final   Epithelial Cells (non renal) 0-10 0 - 10 /hpf Final  Mucus, UA Present (A) Not Estab. Final   Bacteria, UA Moderate (A) None seen/Few Final  Urine culture     Status: None   Collection Time: 06/10/15  8:17 AM  Result Value Ref Range Status   Urine Culture, Routine Final report  Final   Urine Culture result 1 No growth  Final  Culture, blood (routine x 2)     Status: None (Preliminary result)   Collection Time: 06/16/15  7:02 PM  Result Value Ref Range Status   Specimen Description BLOOD RIGHT WRIST  Final   Special Requests   Final    BOTTLES DRAWN AEROBIC AND ANAEROBIC  2CC AERO, 3CC ANA   Culture NO GROWTH 2 DAYS  Final   Report Status PENDING  Incomplete  Culture, blood (routine x 2)      Status: None (Preliminary result)   Collection Time: 06/16/15  8:04 PM  Result Value Ref Range Status   Specimen Description BLOOD RIGHT ASSIST CONTROL  Final   Special Requests BOTTLES DRAWN AEROBIC AND ANAEROBIC 6CC  Final   Culture NO GROWTH 2 DAYS  Final   Report Status PENDING  Incomplete  Urine culture     Status: None (Preliminary result)   Collection Time: 06/16/15  8:04 PM  Result Value Ref Range Status   Specimen Description URINE, CLEAN CATCH  Final   Special Requests NONE  Final   Culture   Final    80,000 COLONIES/ml GRAM POSITIVE COCCI IDENTIFICATION AND SUSCEPTIBILITIES TO FOLLOW    Report Status PENDING  Incomplete     Studies: US Renal  06/17/2015  CLINICAL DATA:  Retained stent.  Right nephrectomy. EXAM: RENAL / URINARY TRACT ULTRASOUND COMPLETE COMPARISON:  05/27/2015 FINDINGS: Right Kidney: Absent consistent with given history. Left Kidney: Length: 14.0 cm. Echogenicity within normal limits. No mass or hydronephrosis visualized. Ureteral stent is visualized in the renal pelvis. Bladder: Appears normal for degree of bladder distention. 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  There is no left hydronephrosis. There is compensatory hypertrophy of the left kidney. Electronically Signed   By: Marybelle Killings M.D.   On: 06/17/2015 16:18   Dg Chest Port 1 View  06/16/2015  CLINICAL DATA:  Chest pain beginning in early January, 2017 which has worsened over the past 2 days. Initial encounter. EXAM: PORTABLE CHEST 1 VIEW COMPARISON:  Single view of the chest 01/16/2015. CT chest 04/03/2012. FINDINGS: The lungs are clear. Heart size is normal. No pneumothorax or pleural effusion. IMPRESSION: Negative chest Electronically Signed   By: Inge Rise M.D.   On: 06/16/2015 18:54    Scheduled Meds: . ampicillin-sulbactam (UNASYN) IV  3 g Intravenous Q6H  . citalopram  20 mg Oral QHS  . heparin  5,000 Units  Subcutaneous 3 times per day  . insulin aspart  0-5 Units Subcutaneous QHS  . insulin aspart  0-9 Units Subcutaneous TID WC  . methylPREDNISolone (SOLU-MEDROL) injection  40 mg Intravenous Q6H  . metoprolol tartrate  25 mg Oral BID  . pantoprazole  40 mg Oral QAC breakfast  . pneumococcal 23 valent vaccine  0.5 mL Intramuscular Tomorrow-1000  . pregabalin  150 mg Oral QHS  . pregabalin  50 mg Oral BH-q7a  . tamsulosin  0.4 mg Oral QHS  . terbinafine  250 mg Oral QHS  . vancomycin  1,000 mg Intravenous Once   Continuous Infusions: . sodium chloride 100 mL/hr at 06/18/15 0702  Assessment/Plan:  1. Clinical sepsis, pyelonephritis. Urine culture growing out 80,000 gram-positive rods. Change antibiotics to IV Unasyn and give 1 dose of IV vancomycin. Urology to remove stent as outpatient 2. Continuous chest pain. Seems more musculoskeletal to me. Possible costochondritis. Start IV Solu-Medrol and see if this improves by tomorrow. 3. Anxiety on Celexa 4. Gastroesophageal reflux disease without esophagitis on Protonix 5. Type 2 diabetes without complication. Sugars will be high on steroids. Patient on sliding scale. 6. Essential hypertension on metoprolol  Code Status:     Code Status Orders        Start     Ordered   06/16/15 2229  Full code   Continuous     06/16/15 2228    Code Status History    Date Active Date Inactive Code Status Order ID Comments User Context   05/27/2015  6:34 PM 05/30/2015  9:56 PM Full Code [id number]  Vaughan Basta, MD Inpatient     Disposition Plan: Home soon  Consultants:  Urology  Cardiology  Antibiotics:  Unasyn  Time spent: 30 minutes  Loletha Grayer  [facility]

## 2015-06-18 NOTE — Progress Notes (Signed)
SUBJECTIVE: Patient continues to have chest pain.   Filed Vitals:   06/18/15 0536 06/18/15 0741 06/18/15 1506 06/18/15 1920  BP: 102/64 111/66 104/65 122/73  Pulse: 70 70 65 66  Temp: 98.4 F (36.9 C) 98.5 F (36.9 C) 98.3 F (36.8 C) 98.4 F (36.9 C)  TempSrc: Oral Oral Oral Oral  Resp: 18 18 18 18   Height:      Weight:      SpO2: 96% 96% 93% 95%    Intake/Output Summary (Last 24 hours) at 06/18/15 1923 Last data filed at 06/18/15 1800  Gross per 24 hour  Intake 1068.33 ml  Output      0 ml  Net 1068.33 ml    LABS: Basic Metabolic Panel:  Recent Labs  06/17/15 0151 06/18/15 0401  NA 132* 133*  K 3.8 3.6  CL 99* 102  CO2 22 26  GLUCOSE 236* 171*  BUN 16 19  CREATININE 1.42* 1.23  CALCIUM 8.4* 7.8*   Liver Function Tests: No results for input(s): AST, ALT, ALKPHOS, BILITOT, PROT, ALBUMIN in the last 72 hours. No results for input(s): LIPASE, AMYLASE in the last 72 hours. CBC:  Recent Labs  06/17/15 0151 06/18/15 0401  WBC 21.8* 12.5*  HGB 14.6 12.0*  HCT 43.1 35.8*  MCV 87.0 88.9  PLT 150 113*   Cardiac Enzymes:  Recent Labs  06/17/15 1925 06/17/15 2318 06/18/15 0401  TROPONINI 0.06* 0.03 0.03   BNP: Invalid input(s): POCBNP D-Dimer: No results for input(s): DDIMER in the last 72 hours. Hemoglobin A1C: No results for input(s): HGBA1C in the last 72 hours. Fasting Lipid Panel: No results for input(s): CHOL, HDL, LDLCALC, TRIG, CHOLHDL, LDLDIRECT in the last 72 hours. Thyroid Function Tests: No results for input(s): TSH, T4TOTAL, T3FREE, THYROIDAB in the last 72 hours.  Invalid input(s): FREET3 Anemia Panel: No results for input(s): VITAMINB12, FOLATE, FERRITIN, TIBC, IRON, RETICCTPCT in the last 72 hours.   PHYSICAL EXAM General: Well developed, well nourished, in no acute distress HEENT:  Normocephalic and atramatic Neck:  No JVD.  Lungs: Clear bilaterally to auscultation and percussion. Heart: HRRR . Normal S1 and S2 without  gallops or murmurs.  Abdomen: Bowel sounds are positive, abdomen soft and non-tender  Msk:  Back normal, normal gait. Normal strength and tone for age. Extremities: No clubbing, cyanosis or edema.   Neuro: Alert and oriented X 3. Psych:  Good affect, responds appropriately  TELEMETRY: Not on monitor  ASSESSMENT AND PLAN: Unstable angina-type of picture with continuous pressure type chest pain even though coronaries were unremarkable on 60 CTA with calcium score 0. Because of repeated episode of chest pain even though patient has renal insufficiency and   and one kidney advise catheterization for Monday. Active Problems:   Sepsis ([place])    Dionisio David, MD, [facility] 06/18/2015 7:23 PM

## 2015-06-18 NOTE — Progress Notes (Signed)
ANTIBIOTIC CONSULT NOTE - INITIAL  Pharmacy Consult for Unasyn Indication: UTI  Allergies  Allergen Reactions  . Hydromorphone Other (See Comments)    Reaction:  Drops pts BP   . Floxin [Ofloxacin] Rash    Patient Measurements: Height: 6\' 2"  (188 cm) Weight: 260 lb (117.935 kg) IBW/kg (Calculated) : 82.2   Vital Signs: Temp: 98.5 F (36.9 C) (02/04 0741) Temp Source: Oral (02/04 0741) BP: 111/66 mmHg (02/04 0741) Pulse Rate: 70 (02/04 0741) Intake/Output from previous day: 02/03 0701 - 02/04 0700 In: 2885 [P.O.:480; I.V.:2355; IV Piggyback:50] Out: -  Intake/Output from this shift: Total I/O In: 120 [P.O.:120] Out: -   Labs:  Recent Labs  06/16/15 1420 06/17/15 0151 06/18/15 0401  WBC 18.0* 21.8* 12.5*  HGB 15.5 14.6 12.0*  PLT 188 150 113*  CREATININE 1.36* 1.42* 1.23   Estimated Creatinine Clearance: 95.9 mL/min (by C-G formula based on Cr of 1.23). No results for input(s): VANCOTROUGH, VANCOPEAK, VANCORANDOM, GENTTROUGH, GENTPEAK, GENTRANDOM, TOBRATROUGH, TOBRAPEAK, TOBRARND, AMIKACINPEAK, AMIKACINTROU, AMIKACIN in the last 72 hours.   Microbiology: Recent Results (from the past 720 hour(s))  Microscopic Examination     Status: Abnormal   Collection Time: 05/27/15 10:49 AM  Result Value Ref Range Status   WBC, UA 11-30 (A) 0 -  5 /hpf Final   RBC, UA >30 (H) 0 -  2 /hpf Final   Epithelial Cells (non renal) 0-10 0 - 10 /hpf Final   Mucus, UA Present (A) Not Estab. Final   Bacteria, UA Many (A) None seen/Few Final  Urine C&S     Status: None   Collection Time: 05/27/15 11:35 AM  Result Value Ref Range Status   Specimen Description URINE, RANDOM  Final   Special Requests none  Final   Culture >=100,000 COLONIES/mL ENTEROCOCCUS FAECALIS  Final   Report Status 05/30/2015 FINAL  Final   Organism ID, Bacteria ENTEROCOCCUS FAECALIS  Final      Susceptibility   Enterococcus faecalis - MIC*    AMPICILLIN <=2 SENSITIVE Sensitive     LINEZOLID 2 SENSITIVE  Sensitive     LEVOFLOXACIN Value in next row Sensitive      SENSITIVE0.5    NITROFURANTOIN Value in next row Sensitive      SENSITIVE<=16    VANCOMYCIN Value in next row Sensitive      SENSITIVE1    TETRACYCLINE Value in next row Resistant      RESISTANT>=16    CIPROFLOXACIN Value in next row Sensitive      SENSITIVE<=0.5    * >=100,000 COLONIES/mL ENTEROCOCCUS FAECALIS  Microscopic Examination     Status: Abnormal   Collection Time: 06/10/15  8:16 AM  Result Value Ref Range Status   WBC, UA 6-10 (A) 0 -  5 /hpf Final   RBC, UA >30 (H) 0 -  2 /hpf Final   Epithelial Cells (non renal) 0-10 0 - 10 /hpf Final   Mucus, UA Present (A) Not Estab. Final   Bacteria, UA Moderate (A) None seen/Few Final  Urine culture     Status: None   Collection Time: 06/10/15  8:17 AM  Result Value Ref Range Status   Urine Culture, Routine Final report  Final   Urine Culture result 1 No growth  Final  Culture, blood (routine x 2)     Status: None (Preliminary result)   Collection Time: 06/16/15  7:02 PM  Result Value Ref Range Status   Specimen Description BLOOD RIGHT WRIST  Final   Special  Requests   Final    BOTTLES DRAWN AEROBIC AND ANAEROBIC  2CC AERO, 3CC ANA   Culture NO GROWTH 2 DAYS  Final   Report Status PENDING  Incomplete  Culture, blood (routine x 2)     Status: None (Preliminary result)   Collection Time: 06/16/15  8:04 PM  Result Value Ref Range Status   Specimen Description BLOOD RIGHT ASSIST CONTROL  Final   Special Requests BOTTLES DRAWN AEROBIC AND ANAEROBIC 6CC  Final   Culture NO GROWTH 2 DAYS  Final   Report Status PENDING  Incomplete  Urine culture     Status: None (Preliminary result)   Collection Time: 06/16/15  8:04 PM  Result Value Ref Range Status   Specimen Description URINE, CLEAN CATCH  Final   Special Requests NONE  Final   Culture   Final    80,000 COLONIES/ml GRAM POSITIVE COCCI IDENTIFICATION AND SUSCEPTIBILITIES TO FOLLOW    Report Status PENDING   Incomplete   Assessment: 55 yo male starting on Unasyn for UTI. Previous enterococcus UTI sensitive to ampicillin from UCx on 1/13. Current culture with 80k GPC.   Goal of Therapy:  Resolution of infection   Plan:  Will order Unasyn 3 g IV q6h.  Will need to continue to follow cultures.   Pharmacy will continue to follow.   Rayna Sexton L 06/18/2015,1:50 PM

## 2015-06-19 ENCOUNTER — Inpatient Hospital Stay: Payer: 59

## 2015-06-19 DIAGNOSIS — Z960 Presence of urogenital implants: Secondary | ICD-10-CM

## 2015-06-19 DIAGNOSIS — N390 Urinary tract infection, site not specified: Secondary | ICD-10-CM

## 2015-06-19 DIAGNOSIS — Q600 Renal agenesis, unilateral: Secondary | ICD-10-CM

## 2015-06-19 DIAGNOSIS — B9620 Unspecified Escherichia coli [E. coli] as the cause of diseases classified elsewhere: Secondary | ICD-10-CM

## 2015-06-19 DIAGNOSIS — N12 Tubulo-interstitial nephritis, not specified as acute or chronic: Secondary | ICD-10-CM

## 2015-06-19 LAB — GLUCOSE, CAPILLARY
Glucose-Capillary: 265 mg/dL — ABNORMAL HIGH (ref 65–99)
Glucose-Capillary: 324 mg/dL — ABNORMAL HIGH (ref 65–99)
Glucose-Capillary: 316 mg/dL — ABNORMAL HIGH (ref 65–99)
Glucose-Capillary: 269 mg/dL — ABNORMAL HIGH (ref 65–99)
Glucose-Capillary: 268 mg/dL — ABNORMAL HIGH (ref 65–99)

## 2015-06-19 MED ORDER — SODIUM CHLORIDE 0.9 % IV SOLN: 250.0000 mL | INTRAVENOUS | Status: DC | PRN

## 2015-06-19 MED ORDER — SODIUM BICARBONATE 8.4 % IV SOLN
INTRAVENOUS | Status: DC
Start: 2015-06-20 — End: 2015-06-20
  Filled 2015-06-19: qty 1000

## 2015-06-19 MED ORDER — SODIUM CHLORIDE 0.9% FLUSH: 3.0000 mL | INTRAVENOUS | Status: DC | PRN

## 2015-06-19 MED ORDER — ASPIRIN 81 MG PO CHEW
81.0000 mg | CHEWABLE_TABLET | ORAL | Status: AC
  Administered 2015-06-20: 81 mg via ORAL
  Filled 2015-06-19: qty 1

## 2015-06-19 MED ORDER — SODIUM BICARBONATE BOLUS VIA INFUSION
INTRAVENOUS | Status: AC
  Administered 2015-06-20: 06:00:00 via INTRAVENOUS
  Filled 2015-06-19: qty 1

## 2015-06-19 MED ORDER — ALPRAZOLAM 0.5 MG PO TABS
0.5000 mg | ORAL_TABLET | Freq: Once | ORAL | Status: AC
Start: 2015-06-19 — End: 2015-06-19
  Administered 2015-06-19: 0.5 mg via ORAL
  Filled 2015-06-19: qty 1

## 2015-06-19 MED ORDER — SODIUM CHLORIDE 0.9 % WEIGHT BASED INFUSION
1.0000 mL/kg/h | INTRAVENOUS | Status: DC
  Administered 2015-06-19 – 2015-06-20 (×2): 1 mL/kg/h via INTRAVENOUS

## 2015-06-19 MED ORDER — SODIUM CHLORIDE 0.9% FLUSH: 3.0000 mL | Freq: Two times a day (BID) | INTRAVENOUS | Status: DC

## 2015-06-19 MED ORDER — SODIUM BICARBONATE 8.4 % IV SOLN
INTRAVENOUS | Status: DC
  Filled 2015-06-19: qty 500

## 2015-06-19 MED ORDER — ALPRAZOLAM 0.5 MG PO TABS: 0.5000 mg | ORAL_TABLET | Freq: Three times a day (TID) | ORAL | Status: DC | PRN

## 2015-06-19 NOTE — Progress Notes (Signed)
Patient complained of chest pain this shift in the chest region, no different than how it has been since he has been here.  Patient to go for Cardiac Cath tomorrow.  Consent obtained

## 2015-06-19 NOTE — Progress Notes (Signed)
Patient still has pressure type of chest pains. Will do cath AM, with IVF overnight and Bicrab drip during cath. Placed orders for 7:30 AM

## 2015-06-19 NOTE — Progress Notes (Signed)
Urology Inpatient Progress Report Sepsis, due to unspecified organism ([place]) [A41.9]    Intv/Subj: No acute events overnight. Flank pain improved by persistent Chest pain seems worse - also has shortness of breath/light headedness when up nad moving - family history of CAD Afebrile   Past Medical History  Diagnosis Date  . Arthritis   . GERD (gastroesophageal reflux disease)   . Hypertension   . Diabetes mellitus without complication ([place])   . Cancer ([place]) 2012    R kidney removed  . 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  Marland Kitchen Anxiety    Current Facility-Administered Medications  Medication Dose Route Frequency Provider Last Rate Last Dose  . 0.9 %  sodium chloride infusion   Intravenous Continuous Demetrios Loll, MD 100 mL/hr at 06/19/15 0601    . acetaminophen (TYLENOL) tablet 650 mg  650 mg Oral Q6H PRN Demetrios Loll, MD   650 mg at 06/19/15 [id number]   Or  . acetaminophen (TYLENOL) suppository 650 mg  650 mg Rectal Q6H PRN Demetrios Loll, MD      . albuterol (PROVENTIL) (2.5 MG/3ML) 0.083% nebulizer solution 2.5 mg  2.5 mg Nebulization Q2H PRN Demetrios Loll, MD      . ALPRAZolam Duanne Moron) tablet 0.5 mg  0.5 mg Oral Once Loletha Grayer, MD      . ALPRAZolam Duanne Moron) tablet 0.5 mg  0.5 mg Oral TID PRN Loletha Grayer, MD      . alum & mag hydroxide-simeth (MAALOX/MYLANTA) 200-200-20 MG/5ML suspension 30 mL  30 mL Oral Q4H PRN Demetrios Loll, MD   30 mL at 06/17/15 2105  . Ampicillin-Sulbactam (UNASYN) 3 g in sodium chloride 0.9 % 100 mL IVPB  3 g Intravenous Q6H Loletha Grayer, MD   3 g at 06/19/15 0257  . citalopram (CELEXA) tablet 20 mg  20 mg Oral QHS Demetrios Loll, MD   20 mg at 06/18/15 2117  . dicyclomine (BENTYL) capsule 10 mg  10 mg Oral TID PRN Demetrios Loll, MD      . diphenhydrAMINE (BENADRYL) capsule 25 mg  25 mg Oral QHS PRN Lance Coon, MD   25 mg at 06/17/15 2241  . heparin injection 5,000 Units  5,000 Units Subcutaneous 3 times per day Demetrios Loll, MD   5,000 Units at 06/19/15 0618  .  HYDROcodone-acetaminophen (NORCO/VICODIN) 5-325 MG per tablet 2 tablet  2 tablet Oral Q4H PRN Demetrios Loll, MD   2 tablet at 06/18/15 2007  . insulin aspart (novoLOG) injection 0-5 Units  0-5 Units Subcutaneous QHS Demetrios Loll, MD   4 Units at 06/18/15 2116  . insulin aspart (novoLOG) injection 0-9 Units  0-9 Units Subcutaneous TID WC Demetrios Loll, MD   3 Units at 06/18/15 1653  . loratadine (CLARITIN) tablet 10 mg  10 mg Oral Daily PRN Demetrios Loll, MD      . metoprolol tartrate (LOPRESSOR) tablet 25 mg  25 mg Oral BID Demetrios Loll, MD   25 mg at 06/18/15 2117  . morphine 4 MG/ML injection 4 mg  4 mg Intravenous Q4H PRN Lance Coon, MD   4 mg at 06/17/15 1900  . ondansetron (ZOFRAN) tablet 4 mg  4 mg Oral Q6H PRN Demetrios Loll, MD       Or  . ondansetron [facility]) injection 4 mg  4 mg Intravenous Q6H PRN Demetrios Loll, MD   4 mg at 06/17/15 2030  . pantoprazole (PROTONIX) EC tablet 40 mg  40 mg Oral QAC breakfast Henreitta Leber,  MD   40 mg at 06/18/15 0904  . pneumococcal 23 valent vaccine (PNU-IMMUNE) injection 0.5 mL  0.5 mL Intramuscular Tomorrow-1000 Lance Coon, MD      . pregabalin (LYRICA) capsule 150 mg  150 mg Oral QHS Demetrios Loll, MD   150 mg at 06/18/15 2117  . pregabalin (LYRICA) capsule 50 mg  50 mg Oral Beaulah Dinning, MD   50 mg at 06/19/15 [id number]  . tamsulosin (FLOMAX) capsule 0.4 mg  0.4 mg Oral QHS Demetrios Loll, MD   0.4 mg at 06/18/15 2117  . terbinafine (LAMISIL) tablet 250 mg  250 mg Oral QHS Demetrios Loll, MD   250 mg at 06/18/15 2117  . tiZANidine (ZANAFLEX) tablet 4-8 mg  4-8 mg Oral TID PRN Demetrios Loll, MD      . traMADol Veatrice Bourbon) tablet 50 mg  50 mg Oral Q6H PRN Henreitta Leber, MD   50 mg at 06/18/15 0905     Objective: Vital: Filed Vitals:   06/18/15 1506 06/18/15 1920 06/19/15 0403 06/19/15 0812  BP: 104/65 122/73 136/92 114/75  Pulse: 65 66 69 50  Temp: 98.3 F (36.8 C) 98.4 F (36.9 C) 97.8 F (36.6 C) 97.5 F (36.4 C)  TempSrc: Oral Oral Oral Oral  Resp: 18 18 18    Height:       Weight:      SpO2: 93% 95% 96% 97%   I/Os: I/O last 3 completed shifts: In: 2720 [P.O.:600; I.V.:2120] Out: -   Physical Exam:  General: Patient is in no apparent distress Lungs: Normal respiratory effort, chest expands symmetrically. Mild left CVA tenderness Ext: lower extremities symmetric  Lab Results:  Recent Labs  06/16/15 1420 06/17/15 0151 06/18/15 0401  WBC 18.0* 21.8* 12.5*  HGB 15.5 14.6 12.0*  HCT 46.6 43.1 35.8*    Recent Labs  06/16/15 1420 06/17/15 0151 06/18/15 0401  NA 133* 132* 133*  K 4.0 3.8 3.6  CL 95* 99* 102  CO2 25 22 26   GLUCOSE 200* 236* 171*  BUN 15 16 19   CREATININE 1.36* 1.42* 1.23  CALCIUM 9.3 8.4* 7.8*    Recent Labs  06/17/15 0151  INR 1.15   No results for input(s): LABURIN in the last 72 hours. Results for orders placed or performed during the hospital encounter of 06/16/15  Culture, blood (routine x 2)     Status: None (Preliminary result)   Collection Time: 06/16/15  7:02 PM  Result Value Ref Range Status   Specimen Description BLOOD RIGHT WRIST  Final   Special Requests   Final    BOTTLES DRAWN AEROBIC AND ANAEROBIC  2CC AERO, 3CC ANA   Culture NO GROWTH 2 DAYS  Final   Report Status PENDING  Incomplete  Culture, blood (routine x 2)     Status: None (Preliminary result)   Collection Time: 06/16/15  8:04 PM  Result Value Ref Range Status   Specimen Description BLOOD RIGHT ASSIST CONTROL  Final   Special Requests BOTTLES DRAWN AEROBIC AND ANAEROBIC 6CC  Final   Culture NO GROWTH 2 DAYS  Final   Report Status PENDING  Incomplete  Urine culture     Status: None (Preliminary result)   Collection Time: 06/16/15  8:04 PM  Result Value Ref Range Status   Specimen Description URINE, CLEAN CATCH  Final   Special Requests NONE  Final   Culture   Final    80,000 COLONIES/ml GRAM POSITIVE COCCI IDENTIFICATION AND SUSCEPTIBILITIES TO FOLLOW    Report  Status PENDING  Incomplete    Studies/Results: US Renal  06/17/2015   CLINICAL DATA:  Retained stent.  Right nephrectomy. EXAM: RENAL / URINARY TRACT ULTRASOUND COMPLETE COMPARISON:  05/27/2015 FINDINGS: Right Kidney: Absent consistent with given history. Left Kidney: Length: 14.0 cm. Echogenicity within normal limits. No mass or hydronephrosis visualized. Ureteral stent is visualized in the renal pelvis. Bladder: Appears normal for degree of bladder distention. 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  There is no left hydronephrosis. There is compensatory hypertrophy of the left kidney. Electronically Signed   By: Marybelle Killings M.D.   On: 06/17/2015 16:18    Assessment: Suspect patient likely has left-sided pyelonephritis - improving. No evidence of hydronephrosis or obstruction.  Plan: Agree with Augmentin BID x 7 days, we can remove the stent.  Chest pain of unclear etiology, cath pending.  Would consider Bi-carb drip prior.  Will schedule stent removal for mid-late week.   LOS: 3 days  Ardis Hughs 06/19/2015, 9:19 AM

## 2015-06-19 NOTE — Progress Notes (Signed)
Patient ID: Anthony Fuller, male   DOB: 1961-05-10, 55 y.o.   MRN: [id number] [facility] PROGRESS NOTE  Anthony Fuller [id number] DOB: [birth year] DOA: 06/16/2015 PCP: Valera Castle, MD  HPI/Subjective: Patient still with continuous chest pain and no relief with steroid trial. Patient is very anxious about his health. Shortness of breath when he standing.  Objective: Filed Vitals:   06/19/15 0403 06/19/15 0812  BP: 136/92 114/75  Pulse: 69 50  Temp: 97.8 F (36.6 C) 97.5 F (36.4 C)  Resp: 18     Filed Weights   06/16/15 1358  Weight: 117.935 kg (260 lb)    ROS: Review of Systems  Constitutional: Positive for diaphoresis. Negative for fever and chills.  Eyes: Negative for blurred vision.  Respiratory: Positive for shortness of breath. Negative for cough.   Cardiovascular: Positive for chest pain.  Gastrointestinal: Negative for nausea, vomiting, abdominal pain, diarrhea and constipation.  Genitourinary: Negative for dysuria.  Musculoskeletal: Negative for joint pain.  Neurological: Negative for dizziness and headaches.   Exam: Physical Exam  Constitutional: He is oriented to person, place, and time.  HENT:  Nose: No mucosal edema.  Mouth/Throat: No oropharyngeal exudate or posterior oropharyngeal edema.  Eyes: Conjunctivae, EOM and lids are normal. Pupils are equal, round, and reactive to light.  Neck: No JVD present. Carotid bruit is not present. No edema present. No thyroid mass and no thyromegaly present.  Cardiovascular: S1 normal and S2 normal.  Exam reveals no gallop.   No murmur heard. Pulses:      Dorsalis pedis pulses are 2+ on the right side, and 2+ on the left side.  Respiratory: No respiratory distress. He has no wheezes. He has no rhonchi. He has no rales.  GI: Soft. Bowel sounds are normal. There is no tenderness.  Musculoskeletal:       Right ankle: He exhibits no swelling.       Left ankle: He exhibits no swelling.   Lymphadenopathy:    He has no cervical adenopathy.  Neurological: He is alert and oriented to person, place, and time. No cranial nerve deficit.  Skin: Skin is warm. No rash noted. Nails show no clubbing.  Psychiatric: He has a normal mood and affect.      Data Reviewed: Basic Metabolic Panel:  Recent Labs Lab 06/16/15 1420 06/17/15 0151 06/18/15 0401  NA 133* 132* 133*  K 4.0 3.8 3.6  CL 95* 99* 102  CO2 25 22 26   GLUCOSE 200* 236* 171*  BUN 15 16 19   CREATININE 1.36* 1.42* 1.23  CALCIUM 9.3 8.4* 7.8*   CBC:  Recent Labs Lab 06/16/15 1420 06/17/15 0151 06/18/15 0401  WBC 18.0* 21.8* 12.5*  HGB 15.5 14.6 12.0*  HCT 46.6 43.1 35.8*  MCV 88.6 87.0 88.9  PLT 188 150 113*   Cardiac Enzymes:  Recent Labs Lab 06/17/15 0151 06/17/15 0529 06/17/15 1925 06/17/15 2318 06/18/15 0401  TROPONINI <0.03 0.05* 0.06* 0.03 0.03    CBG:  Recent Labs Lab 06/18/15 1630 06/18/15 2038 06/18/15 2039 06/19/15 0808 06/19/15 1129  GLUCAP 227* 325* 327* 265* 324*    Recent Results (from the past 240 hour(s))  Microscopic Examination     Status: Abnormal   Collection Time: 06/10/15  8:16 AM  Result Value Ref Range Status   WBC, UA 6-10 (A) 0 -  5 /hpf Final   RBC, UA >30 (H) 0 -  2 /hpf Final   Epithelial Cells (non renal) 0-10 0 - 10 /  hpf Final   Mucus, UA Present (A) Not Estab. Final   Bacteria, UA Moderate (A) None seen/Few Final  Urine culture     Status: None   Collection Time: 06/10/15  8:17 AM  Result Value Ref Range Status   Urine Culture, Routine Final report  Final   Urine Culture result 1 No growth  Final  Culture, blood (routine x 2)     Status: None (Preliminary result)   Collection Time: 06/16/15  7:02 PM  Result Value Ref Range Status   Specimen Description BLOOD RIGHT WRIST  Final   Special Requests   Final    BOTTLES DRAWN AEROBIC AND ANAEROBIC  2CC AERO, 3CC ANA   Culture NO GROWTH 2 DAYS  Final   Report Status PENDING  Incomplete  Culture,  blood (routine x 2)     Status: None (Preliminary result)   Collection Time: 06/16/15  8:04 PM  Result Value Ref Range Status   Specimen Description BLOOD RIGHT ASSIST CONTROL  Final   Special Requests BOTTLES DRAWN AEROBIC AND ANAEROBIC 6CC  Final   Culture NO GROWTH 2 DAYS  Final   Report Status PENDING  Incomplete  Urine culture     Status: None (Preliminary result)   Collection Time: 06/16/15  8:04 PM  Result Value Ref Range Status   Specimen Description URINE, CLEAN CATCH  Final   Special Requests NONE  Final   Culture   Final    80,000 COLONIES/ml GRAM POSITIVE COCCI IDENTIFICATION AND SUSCEPTIBILITIES TO FOLLOW    Report Status PENDING  Incomplete     Studies: Dg Ribs Unilateral Left  06/19/2015  CLINICAL DATA:  Left rib pain. EXAM: LEFT RIBS - 2 VIEW COMPARISON:  06/16/2015 FINDINGS: No fracture or other bone lesions are seen involving the ribs. The lungs are clear without infiltrate or effusion. IMPRESSION: Negative. Electronically Signed   By: Franchot Gallo M.D.   On: 06/19/2015 12:00   Dg Thoracic Spine 2 View  06/19/2015  CLINICAL DATA:  Back pain EXAM: THORACIC SPINE 2 VIEWS COMPARISON:  None. FINDINGS: Normal thoracic alignment. Negative for fracture. Disc degeneration and right lateral spurring noted throughout the thoracic spine. IMPRESSION: Negative for fracture Electronically Signed   By: Franchot Gallo M.D.   On: 06/19/2015 12:01   US Renal  06/17/2015  CLINICAL DATA:  Retained stent.  Right nephrectomy. EXAM: RENAL / URINARY TRACT ULTRASOUND COMPLETE COMPARISON:  05/27/2015 FINDINGS: Right Kidney: Absent consistent with given history. Left Kidney: Length: 14.0 cm. Echogenicity within normal limits. No mass or hydronephrosis visualized. Ureteral stent is visualized in the renal pelvis. Bladder: Appears normal for degree of bladder distention. 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  There is no left  hydronephrosis. There is compensatory hypertrophy of the left kidney. Electronically Signed   By: Marybelle Killings M.D.   On: 06/17/2015 16:18    Scheduled Meds: . ampicillin-sulbactam (UNASYN) IV  3 g Intravenous Q6H  . citalopram  20 mg Oral QHS  . heparin  5,000 Units Subcutaneous 3 times per day  . insulin aspart  0-5 Units Subcutaneous QHS  . insulin aspart  0-9 Units Subcutaneous TID WC  . metoprolol tartrate  25 mg Oral BID  . pantoprazole  40 mg Oral QAC breakfast  . pneumococcal 23 valent vaccine  0.5 mL Intramuscular Tomorrow-1000  . pregabalin  150 mg Oral QHS  . pregabalin  50 mg Oral BH-q7a  .  tamsulosin  0.4 mg Oral QHS  . terbinafine  250 mg Oral QHS   Continuous Infusions: . sodium chloride 100 mL/hr at 06/19/15 0601    Assessment/Plan:  1. Clinical sepsis, pyelonephritis. Urine culture growing out 80,000 gram-positive rods. I spoke with microbiologist this morning, it looks like enterococcus again. Final results will be tomorrow. Continue IV Unasyn. Urology to remove stent as outpatient 2. Continuous chest pain. No improvement with steroid trial. I ordered x-rays of the ribs and thoracic spine which was negative. I will order an MRI of the thoracic spine to see if this is nerve pain coming around the left side. No rash to indicate shingles. Cardiology to do a cardiac catheter tomorrow. 3. Anxiety on Celexa, and Xanax and see if this helps his chest pain. 4. Gastroesophageal reflux disease without esophagitis on Protonix 5. Type 2 diabetes without complication. Sugars will be high on steroids. Patient on sliding scale. 6. Essential hypertension on metoprolol  Code Status:     Code Status Orders        Start     Ordered   06/16/15 2229  Full code   Continuous     06/16/15 2228    Code Status History    Date Active Date Inactive Code Status Order ID Comments User Context   05/27/2015  6:34 PM 05/30/2015  9:56 PM Full Code [id number]  Vaughan Basta, MD  Inpatient     Disposition Plan: Home soon  Consultants:  Urology  Cardiology  Antibiotics:  Unasyn  Time spent: 25 minutes  [place], [place] [facility]

## 2015-06-19 NOTE — Progress Notes (Signed)
MEDICATION RELATED CONSULT NOTE - INITIAL   Pharmacy Consult for sodium bicarbonate for prevention of CIN Indication: for cardiac cath procedure on 2/6  Allergies  Allergen Reactions  . Hydromorphone Other (See Comments)    Reaction:  Drops pts BP   . Floxin [Ofloxacin] Rash    Patient Measurements: Height: 6\' 2"  (188 cm) Weight: 260 lb (117.935 kg) IBW/kg (Calculated) : 82.2  Vital Signs: Temp: 96.7 F (35.9 C) (02/05 1554) Temp Source: Axillary (02/05 1554) BP: 95/44 mmHg (02/05 1553) Pulse Rate: 56 (02/05 1553) Intake/Output from previous day: 02/04 0701 - 02/05 0700 In: 1661.7 [P.O.:600; I.V.:1061.7] Out: -  Intake/Output from this shift: Total I/O In: 480 [P.O.:480] Out: -   Estimated Creatinine Clearance: 95.9 mL/min (by C-G formula based on Cr of 1.23).  Assessment: Patient is scheduled for cardiac cath tomorrow. There were no active diuretic orders to hold. Patient weighs 118 kg.   Plan:  Sodium bicarbonate to run at 330 mL/hr for one hour prior to the start of the procedure followed by a rate of 120 mL/hr for duration of cath and for 6 hours after procedure.  Thank you for the consult.  Lenis Noon, PharmD Clinical Pharmacist 06/19/2015,6:09 PM

## 2015-06-20 ENCOUNTER — Encounter: Payer: Self-pay | Admitting: Cardiovascular Disease

## 2015-06-20 ENCOUNTER — Inpatient Hospital Stay: Payer: 59

## 2015-06-20 ENCOUNTER — Encounter
Admission: EM | Disposition: A | Discharge status: Home / Self Care | Payer: Self-pay | Source: Home / Self Care | Attending: Internal Medicine

## 2015-06-20 HISTORY — PX: CARDIAC CATHETERIZATION: SHX172

## 2015-06-20 LAB — BASIC METABOLIC PANEL
Anion gap: 8 (ref 5–15)
BUN: 28 mg/dL — ABNORMAL HIGH (ref 6–20)
CO2: 23 mmol/L (ref 22–32)
Calcium: 8.7 mg/dL — ABNORMAL LOW (ref 8.9–10.3)
Chloride: 106 mmol/L (ref 101–111)
Creatinine, Ser: 1.04 mg/dL (ref 0.61–1.24)
GFR calc Af Amer: >60 mL/min (ref >60)
GFR calc non Af Amer: >60 mL/min (ref >60)
Glucose, Bld: 282 mg/dL — ABNORMAL HIGH (ref 65–99)
Potassium: 4.3 mmol/L (ref 3.5–5.1)
Sodium: 137 mmol/L (ref 135–145)

## 2015-06-20 LAB — GLUCOSE, CAPILLARY
Glucose-Capillary: 241 mg/dL — ABNORMAL HIGH (ref 65–99)
Glucose-Capillary: 235 mg/dL — ABNORMAL HIGH (ref 65–99)
Glucose-Capillary: 235 mg/dL — ABNORMAL HIGH (ref 65–99)
Glucose-Capillary: 193 mg/dL — ABNORMAL HIGH (ref 65–99)
Glucose-Capillary: 180 mg/dL — ABNORMAL HIGH (ref 65–99)

## 2015-06-20 LAB — URINE CULTURE: Culture: 80000

## 2015-06-20 SURGERY — LEFT HEART CATH AND CORONARY ANGIOGRAPHY
Anesthesia: Moderate Sedation | Laterality: Right

## 2015-06-20 MED ORDER — SODIUM CHLORIDE 0.9 % WEIGHT BASED INFUSION: 1.0000 mL/kg/h | INTRAVENOUS | Status: AC

## 2015-06-20 MED ORDER — SODIUM CHLORIDE 0.9 % IV SOLN: 250.0000 mL | INTRAVENOUS | Status: DC | PRN

## 2015-06-20 MED ORDER — IOHEXOL 300 MG/ML  SOLN
INTRAMUSCULAR | Status: DC | PRN
  Administered 2015-06-20: 35 mL via INTRA_ARTERIAL

## 2015-06-20 MED ORDER — GI COCKTAIL ~~LOC~~
30.0000 mL | Freq: Once | ORAL | Status: AC
  Administered 2015-06-20: 30 mL via ORAL
  Filled 2015-06-20: qty 30

## 2015-06-20 MED ORDER — FENTANYL CITRATE (PF) 100 MCG/2ML IJ SOLN
INTRAMUSCULAR | Status: DC | PRN
  Administered 2015-06-20: 50 ug via INTRAVENOUS

## 2015-06-20 MED ORDER — MIDAZOLAM HCL 2 MG/2ML IJ SOLN
INTRAMUSCULAR | Status: DC | PRN
  Administered 2015-06-20: 1 mg via INTRAVENOUS

## 2015-06-20 MED ORDER — HYDROCODONE-ACETAMINOPHEN 5-325 MG PO TABS
1.0000 | ORAL_TABLET | ORAL | Status: DC | PRN
  Administered 2015-06-20: 1 via ORAL
  Administered 2015-06-21: 2 via ORAL
  Administered 2015-06-21: 1 via ORAL
  Filled 2015-06-20: qty 1
  Filled 2015-06-20: qty 2
  Filled 2015-06-20: qty 1

## 2015-06-20 MED ORDER — ACETAMINOPHEN 325 MG PO TABS: 650.0000 mg | ORAL_TABLET | ORAL | Status: DC | PRN

## 2015-06-20 MED ORDER — TECHNETIUM TC 99M DIETHYLENETRIAME-PENTAACETIC ACID
32.3260 | Freq: Once | INTRAVENOUS | Status: AC | PRN
  Administered 2015-06-20: 32.326 via RESPIRATORY_TRACT

## 2015-06-20 MED ORDER — HEPARIN (PORCINE) IN NACL 2-0.9 UNIT/ML-% IJ SOLN
INTRAMUSCULAR | Status: AC
  Filled 2015-06-20: qty 1000

## 2015-06-20 MED ORDER — SODIUM CHLORIDE 0.9% FLUSH: 3.0000 mL | INTRAVENOUS | Status: DC | PRN

## 2015-06-20 MED ORDER — ONDANSETRON HCL 4 MG/2ML IJ SOLN: 4.0000 mg | Freq: Four times a day (QID) | INTRAMUSCULAR | Status: DC | PRN

## 2015-06-20 MED ORDER — FENTANYL CITRATE (PF) 100 MCG/2ML IJ SOLN
INTRAMUSCULAR | Status: AC
  Filled 2015-06-20: qty 2

## 2015-06-20 MED ORDER — TECHNETIUM TO 99M ALBUMIN AGGREGATED
4.1480 | Freq: Once | INTRAVENOUS | Status: AC | PRN
  Administered 2015-06-20: 4.148 via INTRAVENOUS

## 2015-06-20 MED ORDER — MIDAZOLAM HCL 2 MG/2ML IJ SOLN
INTRAMUSCULAR | Status: AC
  Filled 2015-06-20: qty 2

## 2015-06-20 MED ORDER — SODIUM CHLORIDE 0.9% FLUSH
3.0000 mL | Freq: Two times a day (BID) | INTRAVENOUS | Status: DC
Start: 2015-06-20 — End: 2015-06-21
  Administered 2015-06-20: 3 mL via INTRAVENOUS

## 2015-06-20 SURGICAL SUPPLY — 9 items
CATH INFINITI 5FR ANG PIGTAIL (CATHETERS) ×3 IMPLANT
CATH INFINITI 5FR JL4 (CATHETERS) ×3 IMPLANT
CATH INFINITI JR4 5F (CATHETERS) ×3 IMPLANT
DEVICE CLOSURE MYNXGRIP 5F (Vascular Products) ×3 IMPLANT
KIT MANI 3VAL PERCEP (MISCELLANEOUS) ×3 IMPLANT
NEEDLE PERC 18GX7CM (NEEDLE) ×3 IMPLANT
PACK CARDIAC CATH (CUSTOM PROCEDURE TRAY) ×3 IMPLANT
SHEATH PINNACLE 5F 10CM (SHEATH) ×3 IMPLANT
WIRE EMERALD 3MM-J .035X150CM (WIRE) ×3 IMPLANT

## 2015-06-20 NOTE — Progress Notes (Signed)
Inpatient Diabetes Program Recommendations  AACE/ADA: New Consensus Statement on Inpatient Glycemic Control (2015)  Target Ranges:  Prepandial:   less than 140 mg/dL      Peak postprandial:   less than 180 mg/dL (1-2 hours)      Critically ill patients:  140 - 180 mg/dL   Review of Glycemic Control:  Results for CHRISOPHER, HOUSHOLDER (MRN [id number]) as of 06/20/2015 10:03  Ref. Range 06/19/2015 16:02 06/19/2015 19:25 06/19/2015 22:43 06/20/2015 07:47 06/20/2015 09:31  Glucose-Capillary Latest Ref Range: 65-99 mg/dL 316 (H) 269 (H) 268 (H) 241 (H) 235 (H)   Diabetes history: DM2 Outpatient Diabetes medications: Jardiance 10 mg QAM, Metformin XR 500 QPM Current orders for Inpatient glycemic control: Novolog 0-9 units TID with meals, Novolog 0-5 units QHS Inpatient Diabetes Program Recommendations:  Note blood sugars increased with steroids which are now discontinued.  May consider increasing Novolog correction to moderate and possibly add Levemir 15 units daily (while in the hospital and oral meds on hold).  Thanks, Adah Perl, RN, BC-ADM Inpatient Diabetes Coordinator Pager [phone] (8a-5p)

## 2015-06-20 NOTE — Progress Notes (Signed)
Patient ID: Anthony Fuller, male   DOB: 07/29/1960, 55 y.o.   MRN: [id number] [facility] PROGRESS NOTE  Anthony Fuller [id number] DOB: 11-03-1960 DOA: 06/16/2015 PCP: Valera Castle, MD  HPI/Subjective: Patient still having continuous chest pain. Cardiac catheter this morning was negative. Patient still nervous about his health. I asked the nurse to give oral pain medication to see if that helps.  Objective: Filed Vitals:   06/20/15 1105 06/20/15 1107  BP: 130/79 124/81  Pulse: 52 57  Temp:    Resp: 18     Filed Weights   06/16/15 1358  Weight: 117.935 kg (260 lb)    ROS: Review of Systems  Constitutional: Positive for diaphoresis. Negative for fever and chills.  Eyes: Negative for blurred vision.  Respiratory: Positive for shortness of breath. Negative for cough.   Cardiovascular: Positive for chest pain.  Gastrointestinal: Negative for nausea, vomiting, abdominal pain, diarrhea and constipation.  Genitourinary: Negative for dysuria.  Musculoskeletal: Negative for joint pain.  Neurological: Negative for dizziness and headaches.   Exam: Physical Exam  Constitutional: He is oriented to person, place, and time.  HENT:  Nose: No mucosal edema.  Mouth/Throat: No oropharyngeal exudate or posterior oropharyngeal edema.  Eyes: Conjunctivae, EOM and lids are normal. Pupils are equal, round, and reactive to light.  Neck: No JVD present. Carotid bruit is not present. No edema present. No thyroid mass and no thyromegaly present.  Cardiovascular: S1 normal and S2 normal.  Exam reveals no gallop.   No murmur heard. Pulses:      Dorsalis pedis pulses are 2+ on the right side, and 2+ on the left side.  Chest wall pain to palpation left chest and left parasternal.  Respiratory: No respiratory distress. He has no wheezes. He has no rhonchi. He has no rales.  GI: Soft. Bowel sounds are normal. There is no tenderness.  Musculoskeletal:       Right ankle: He  exhibits no swelling.       Left ankle: He exhibits no swelling.  Lymphadenopathy:    He has no cervical adenopathy.  Neurological: He is alert and oriented to person, place, and time. No cranial nerve deficit.  Skin: Skin is warm. No rash noted. Nails show no clubbing.  Psychiatric: He has a normal mood and affect.      Data Reviewed: Basic Metabolic Panel:  Recent Labs Lab 06/16/15 1420 06/17/15 0151 06/18/15 0401 06/20/15 0353  NA 133* 132* 133* 137  K 4.0 3.8 3.6 4.3  CL 95* 99* 102 106  CO2 25 22 26 23   GLUCOSE 200* 236* 171* 282*  BUN 15 16 19  28*  CREATININE 1.36* 1.42* 1.23 1.04  CALCIUM 9.3 8.4* 7.8* 8.7*   CBC:  Recent Labs Lab 06/16/15 1420 06/17/15 0151 06/18/15 0401  WBC 18.0* 21.8* 12.5*  HGB 15.5 14.6 12.0*  HCT 46.6 43.1 35.8*  MCV 88.6 87.0 88.9  PLT 188 150 113*   Cardiac Enzymes:  Recent Labs Lab 06/17/15 0151 06/17/15 0529 06/17/15 1925 06/17/15 2318 06/18/15 0401  TROPONINI <0.03 0.05* 0.06* 0.03 0.03    CBG:  Recent Labs Lab 06/19/15 1925 06/19/15 2243 06/20/15 0747 06/20/15 0931 06/20/15 1112  GLUCAP 269* 268* 241* 235* 235*    Recent Results (from the past 240 hour(s))  Culture, blood (routine x 2)     Status: None (Preliminary result)   Collection Time: 06/16/15  7:02 PM  Result Value Ref Range Status   Specimen Description BLOOD RIGHT WRIST  Final   Special Requests   Final    BOTTLES DRAWN AEROBIC AND ANAEROBIC  2CC AERO, 3CC ANA   Culture NO GROWTH 2 DAYS  Final   Report Status PENDING  Incomplete  Culture, blood (routine x 2)     Status: None (Preliminary result)   Collection Time: 06/16/15  8:04 PM  Result Value Ref Range Status   Specimen Description BLOOD RIGHT ASSIST CONTROL  Final   Special Requests BOTTLES DRAWN AEROBIC AND ANAEROBIC 6CC  Final   Culture NO GROWTH 2 DAYS  Final   Report Status PENDING  Incomplete  Urine culture     Status: None   Collection Time: 06/16/15  8:04 PM  Result Value Ref  Range Status   Specimen Description URINE, CLEAN CATCH  Final   Special Requests NONE  Final   Culture 80,000 COLONIES/ml ENTEROCOCCUS FAECALIS  Final   Report Status 06/20/2015 FINAL  Final   Organism ID, Bacteria ENTEROCOCCUS FAECALIS  Final      Susceptibility   Enterococcus faecalis - MIC*    AMPICILLIN <=2 SENSITIVE Sensitive     LEVOFLOXACIN 1 SENSITIVE Sensitive     NITROFURANTOIN <=16 SENSITIVE Sensitive     VANCOMYCIN 1 SENSITIVE Sensitive     LINEZOLID 2 SENSITIVE Sensitive     * 80,000 COLONIES/ml ENTEROCOCCUS FAECALIS     Studies: Dg Ribs Unilateral Left  06/19/2015  CLINICAL DATA:  Left rib pain. EXAM: LEFT RIBS - 2 VIEW COMPARISON:  06/16/2015 FINDINGS: No fracture or other bone lesions are seen involving the ribs. The lungs are clear without infiltrate or effusion. IMPRESSION: Negative. Electronically Signed   By: Franchot Gallo M.D.   On: 06/19/2015 12:00   Dg Thoracic Spine 2 View  06/19/2015  CLINICAL DATA:  Back pain EXAM: THORACIC SPINE 2 VIEWS COMPARISON:  None. FINDINGS: Normal thoracic alignment. Negative for fracture. Disc degeneration and right lateral spurring noted throughout the thoracic spine. IMPRESSION: Negative for fracture Electronically Signed   By: Franchot Gallo M.D.   On: 06/19/2015 12:01    Scheduled Meds: . ampicillin-sulbactam (UNASYN) IV  3 g Intravenous Q6H  . citalopram  20 mg Oral QHS  . heparin  5,000 Units Subcutaneous 3 times per day  . insulin aspart  0-5 Units Subcutaneous QHS  . insulin aspart  0-9 Units Subcutaneous TID WC  . pantoprazole  40 mg Oral QAC breakfast  . pregabalin  150 mg Oral QHS  . pregabalin  50 mg Oral BH-q7a  . sodium chloride flush  3 mL Intravenous Q12H  . tamsulosin  0.4 mg Oral QHS  . terbinafine  250 mg Oral QHS    Assessment/Plan:  1. Clinical sepsis, pyelonephritis. Urine culture growing out 80,000 enterococcus. Continue IV Unasyn. Urology to remove stent as outpatient 2. Continuous chest pain. Cardiac  catheter completely negative. No improvement with steroid trial. I ordered x-rays of the ribs and thoracic spine which was negative. I will order an MRI of the thoracic spine to see if this is nerve pain coming around the left side. No rash to indicate shingles. I also ordered a VQ scan to rule out pulmonary embolism. GI cocktail given just in case GI in nature. 3. Anxiety on Celexa, and Xanax and see if this helps his chest pain. 4. Gastroesophageal reflux disease without esophagitis on Protonix 5. Type 2 diabetes without complication. Sugars will be high on steroids. Patient on sliding scale. 6. Relative hypotension hold metoprolol  Code Status:  Code Status Orders        Start     Ordered   06/16/15 2229  Full code   Continuous     06/16/15 2228    Code Status History    Date Active Date Inactive Code Status Order ID Comments User Context   05/27/2015  6:34 PM 05/30/2015  9:56 PM Full Code [id number]  Vaughan Basta, MD Inpatient     Disposition Plan: Home soon  Consultants:  Urology  Cardiology  Antibiotics:  Unasyn  Time spent: 25 minutes  [place], [place] [facility]

## 2015-06-20 NOTE — Progress Notes (Signed)
SUBJECTIVE: Patient is doing better   Filed Vitals:   06/20/15 0447 06/20/15 0530 06/20/15 0709 06/20/15 0722  BP: 110/71 100/62  116/83  Pulse: 58 60  50  Temp:  98.2 F (36.8 C) 97.6 F (36.4 C)   TempSrc:  Oral    Resp:  18  20  Height:      Weight:      SpO2:  96%  99%    Intake/Output Summary (Last 24 hours) at 06/20/15 0820 Last data filed at 06/20/15 0600  Gross per 24 hour  Intake 1980.04 ml  Output      0 ml  Net 1980.04 ml    LABS: Basic Metabolic Panel:  Recent Labs  06/18/15 0401 06/20/15 0353  NA 133* 137  K 3.6 4.3  CL 102 106  CO2 26 23  GLUCOSE 171* 282*  BUN 19 28*  CREATININE 1.23 1.04  CALCIUM 7.8* 8.7*   Liver Function Tests: No results for input(s): AST, ALT, ALKPHOS, BILITOT, PROT, ALBUMIN in the last 72 hours. No results for input(s): LIPASE, AMYLASE in the last 72 hours. CBC:  Recent Labs  06/18/15 0401  WBC 12.5*  HGB 12.0*  HCT 35.8*  MCV 88.9  PLT 113*   Cardiac Enzymes:  Recent Labs  06/17/15 1925 06/17/15 2318 06/18/15 0401  TROPONINI 0.06* 0.03 0.03   BNP: Invalid input(s): POCBNP D-Dimer: No results for input(s): DDIMER in the last 72 hours. Hemoglobin A1C: No results for input(s): HGBA1C in the last 72 hours. Fasting Lipid Panel: No results for input(s): CHOL, HDL, LDLCALC, TRIG, CHOLHDL, LDLDIRECT in the last 72 hours. Thyroid Function Tests: No results for input(s): TSH, T4TOTAL, T3FREE, THYROIDAB in the last 72 hours.  Invalid input(s): FREET3 Anemia Panel: No results for input(s): VITAMINB12, FOLATE, FERRITIN, TIBC, IRON, RETICCTPCT in the last 72 hours.   PHYSICAL EXAM General: Well developed, well nourished, in no acute distress HEENT:  Normocephalic and atramatic Neck:  No JVD.  Lungs: Clear bilaterally to auscultation and percussion. Heart: HRRR . Normal S1 and S2 without gallops or murmurs.  Abdomen: Bowel sounds are positive, abdomen soft and non-tender  Msk:  Back normal, normal gait.  Normal strength and tone for age. Extremities: No clubbing, cyanosis or edema.   Neuro: Alert and oriented X 3. 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 Had normal EF on echo. Non-cardiac chest pains, may go home with f/u next week.  Active Problems:   Sepsis ([place])    Neoma Laming A, MD, [facility] 06/20/2015 8:20 AM

## 2015-06-20 NOTE — Progress Notes (Signed)
Metoprolol scheduled, advised MD of blood pressure and heart rate, advised to discontinue order since patient is bradycardic and blood pressure is normal.

## 2015-06-21 ENCOUNTER — Inpatient Hospital Stay: Payer: 59

## 2015-06-21 LAB — BASIC METABOLIC PANEL
Anion gap: 7 (ref 5–15)
BUN: 21 mg/dL — ABNORMAL HIGH (ref 6–20)
CO2: 28 mmol/L (ref 22–32)
Calcium: 8.3 mg/dL — ABNORMAL LOW (ref 8.9–10.3)
Chloride: 105 mmol/L (ref 101–111)
Creatinine, Ser: 1.23 mg/dL (ref 0.61–1.24)
GFR calc Af Amer: >60 mL/min (ref >60)
GFR calc non Af Amer: >60 mL/min (ref >60)
Glucose, Bld: 135 mg/dL — ABNORMAL HIGH (ref 65–99)
Potassium: 3.7 mmol/L (ref 3.5–5.1)
Sodium: 140 mmol/L (ref 135–145)

## 2015-06-21 LAB — GLUCOSE, CAPILLARY: Glucose-Capillary: 190 mg/dL — ABNORMAL HIGH (ref 65–99)

## 2015-06-21 LAB — CULTURE, BLOOD (ROUTINE X 2)
Culture: NO GROWTH
Culture: NO GROWTH

## 2015-06-21 MED ORDER — PREGABALIN 150 MG PO CAPS: 150.0000 mg | ORAL_CAPSULE | Freq: Two times a day (BID) | ORAL | Status: DC

## 2015-06-21 MED ORDER — PREDNISONE 5 MG PO TABS: ORAL_TABLET | ORAL | Status: DC

## 2015-06-21 MED ORDER — PREDNISONE 20 MG PO TABS
20.0000 mg | ORAL_TABLET | Freq: Every day | ORAL | Status: DC
  Administered 2015-06-21: 20 mg via ORAL
  Filled 2015-06-21: qty 1

## 2015-06-21 MED ORDER — AMOXICILLIN-POT CLAVULANATE 875-125 MG PO TABS: 1.0000 | ORAL_TABLET | Freq: Two times a day (BID) | ORAL | Status: DC

## 2015-06-21 MED ORDER — HYDROCODONE-ACETAMINOPHEN 5-325 MG PO TABS: 1.0000 | ORAL_TABLET | Freq: Four times a day (QID) | ORAL | Status: DC | PRN

## 2015-06-21 MED ORDER — PREGABALIN 75 MG PO CAPS: 150.0000 mg | ORAL_CAPSULE | ORAL | Status: DC

## 2015-06-21 NOTE — Progress Notes (Signed)
Inpatient Diabetes Program Recommendations  AACE/ADA: New Consensus Statement on Inpatient Glycemic Control (2015)  Target Ranges:  Prepandial:   less than 140 mg/dL      Peak postprandial:   less than 180 mg/dL (1-2 hours)      Critically ill patients:  140 - 180 mg/dL   Review of Glycemic Control  Results for DALTON, CURTISS (MRN [id number]) as of 06/21/2015 10:51  Ref. Range 06/20/2015 07:47 06/20/2015 09:31 06/20/2015 11:12 06/20/2015 15:05 06/20/2015 21:17  Glucose-Capillary Latest Ref Range: 65-99 mg/dL 241 (H) 235 (H) 235 (H) 193 (H) 180 (H)    Diabetes history: DM2 Outpatient Diabetes medications: Jardiance 10 mg QAM, Metformin XR 500 QPM  Current orders for Inpatient glycemic control: Novolog 0-9 units TID with meals, Novolog 0-5 units QHS  Inpatient Diabetes Program Recommendations: Note blood sugars increased with steroids- currently taking Prednisone 20mg  with breakfast.  Consider increasing correction insulin to moderate correction scale 0-15 units tid - decrease as steroids are tapered.  Gentry Fitz, RN, BA, MHA, CDE Diabetes Coordinator Inpatient Diabetes Program  [phone] (Team Pager) [phone] ([place]) 06/21/2015 10:53 AM

## 2015-06-21 NOTE — Progress Notes (Signed)
ANTIBIOTIC CONSULT NOTE - FOLLOW UP   Pharmacy Consult for Unasyn Indication: UTI  Allergies  Allergen Reactions  . Hydromorphone Other (See Comments)    Reaction:  Drops pts BP   . Floxin [Ofloxacin] Rash    Patient Measurements: Height: 6\' 2"  (188 cm) Weight: 260 lb (117.935 kg) IBW/kg (Calculated) : 82.2   Vital Signs: Temp: 97.9 F (36.6 C) (02/07 0736) Temp Source: Oral (02/07 0736) BP: 137/81 mmHg (02/07 0736) Pulse Rate: 58 (02/07 0736) Intake/Output from previous day: 02/06 0701 - 02/07 0700 In: 923 [P.O.:720; I.V.:3; IV Piggyback:200] Out: 1 [Urine:1] Intake/Output from this shift: Total I/O In: 240 [P.O.:240] Out: -   Labs:  Recent Labs  06/20/15 0353 06/21/15 0527  CREATININE 1.04 1.23   Estimated Creatinine Clearance: 95.9 mL/min (by C-G formula based on Cr of 1.23). No results for input(s): VANCOTROUGH, VANCOPEAK, VANCORANDOM, GENTTROUGH, GENTPEAK, GENTRANDOM, TOBRATROUGH, TOBRAPEAK, TOBRARND, AMIKACINPEAK, AMIKACINTROU, AMIKACIN in the last 72 hours.   Microbiology: Recent Results (from the past 720 hour(s))  Microscopic Examination     Status: Abnormal   Collection Time: 05/27/15 10:49 AM  Result Value Ref Range Status   WBC, UA 11-30 (A) 0 -  5 /hpf Final   RBC, UA >30 (H) 0 -  2 /hpf Final   Epithelial Cells (non renal) 0-10 0 - 10 /hpf Final   Mucus, UA Present (A) Not Estab. Final   Bacteria, UA Many (A) None seen/Few Final  Urine C&S     Status: None   Collection Time: 05/27/15 11:35 AM  Result Value Ref Range Status   Specimen Description URINE, RANDOM  Final   Special Requests none  Final   Culture >=100,000 COLONIES/mL ENTEROCOCCUS FAECALIS  Final   Report Status 05/30/2015 FINAL  Final   Organism ID, Bacteria ENTEROCOCCUS FAECALIS  Final      Susceptibility   Enterococcus faecalis - MIC*    AMPICILLIN <=2 SENSITIVE Sensitive     LINEZOLID 2 SENSITIVE Sensitive     LEVOFLOXACIN Value in next row Sensitive      SENSITIVE0.5   NITROFURANTOIN Value in next row Sensitive      SENSITIVE<=16    VANCOMYCIN Value in next row Sensitive      SENSITIVE1    TETRACYCLINE Value in next row Resistant      RESISTANT>=16    CIPROFLOXACIN Value in next row Sensitive      SENSITIVE<=0.5    * >=100,000 COLONIES/mL ENTEROCOCCUS FAECALIS  Microscopic Examination     Status: Abnormal   Collection Time: 06/10/15  8:16 AM  Result Value Ref Range Status   WBC, UA 6-10 (A) 0 -  5 /hpf Final   RBC, UA >30 (H) 0 -  2 /hpf Final   Epithelial Cells (non renal) 0-10 0 - 10 /hpf Final   Mucus, UA Present (A) Not Estab. Final   Bacteria, UA Moderate (A) None seen/Few Final  Urine culture     Status: None   Collection Time: 06/10/15  8:17 AM  Result Value Ref Range Status   Urine Culture, Routine Final report  Final   Urine Culture result 1 No growth  Final  Culture, blood (routine x 2)     Status: None   Collection Time: 06/16/15  7:02 PM  Result Value Ref Range Status   Specimen Description BLOOD RIGHT WRIST  Final   Special Requests   Final    BOTTLES DRAWN AEROBIC AND ANAEROBIC  2CC AERO, 3CC ANA   Culture  NO GROWTH 5 DAYS  Final   Report Status 06/21/2015 FINAL  Final  Culture, blood (routine x 2)     Status: None   Collection Time: 06/16/15  8:04 PM  Result Value Ref Range Status   Specimen Description BLOOD RIGHT ASSIST CONTROL  Final   Special Requests BOTTLES DRAWN AEROBIC AND ANAEROBIC 6CC  Final   Culture NO GROWTH 5 DAYS  Final   Report Status 06/21/2015 FINAL  Final  Urine culture     Status: None   Collection Time: 06/16/15  8:04 PM  Result Value Ref Range Status   Specimen Description URINE, CLEAN CATCH  Final   Special Requests NONE  Final   Culture 80,000 COLONIES/ml ENTEROCOCCUS FAECALIS  Final   Report Status 06/20/2015 FINAL  Final   Organism ID, Bacteria ENTEROCOCCUS FAECALIS  Final      Susceptibility   Enterococcus faecalis - MIC*    AMPICILLIN <=2 SENSITIVE Sensitive     LEVOFLOXACIN 1 SENSITIVE  Sensitive     NITROFURANTOIN <=16 SENSITIVE Sensitive     VANCOMYCIN 1 SENSITIVE Sensitive     LINEZOLID 2 SENSITIVE Sensitive     * 80,000 COLONIES/ml ENTEROCOCCUS FAECALIS   Assessment: Pharmacy consulted to dose Unasyn for UTI.   Urine culture resulted as E. Faecalis 80 k CFU.   Sensitive to ampicillin   Goal of Therapy:  Resolution of infection   Plan:  Will continue Unasyn 3 g IV q6 hours for now.   Pharmacy will continue to follow.   Ronni Osterberg D 06/21/2015,10:38 AM

## 2015-06-21 NOTE — Progress Notes (Addendum)
Patient ID: Anthony Fuller, male   DOB: Feb 15, 1961, 55 y.o.   MRN: [id number] [place] [facility] [place] was admitted to the Hospital on 06/16/2015 and Discharged  06/21/2015 and should be excused from work/school   for 15 days starting 06/16/2015 , may return to work/school without any restrictions.  Loletha Grayer M.D on 06/21/2015,at 1:37 PM  [place] [facility] [place]

## 2015-06-21 NOTE — Discharge Planning (Signed)
Pt IV removed.  Pt DC papers given, explained and educated.  Told of suggested FU appts.  Scripts given.  RN assessment and VSS for DC to home.  Pt will be wheeled to car and family transporting home via car.

## 2015-06-21 NOTE — Progress Notes (Signed)
SUBJECTIVE: Patient still has intermittent chest pain   Filed Vitals:   06/20/15 1504 06/20/15 1920 06/21/15 0427 06/21/15 0736  BP: 103/60 124/73 119/52 137/81  Pulse: 56 58 62 58  Temp: 97.5 F (36.4 C) 98.2 F (36.8 C) 98.6 F (37 C) 97.9 F (36.6 C)  TempSrc: Oral Oral Oral Oral  Resp: 18 18 18 18   Height:      Weight:      SpO2: 97% 99% 96% 99%    Intake/Output Summary (Last 24 hours) at 06/21/15 1240 Last data filed at 06/21/15 0900  Gross per 24 hour  Intake    923 ml  Output      1 ml  Net    922 ml    LABS: Basic Metabolic Panel:  Recent Labs  06/20/15 0353 06/21/15 0527  NA 137 140  K 4.3 3.7  CL 106 105  CO2 23 28  GLUCOSE 282* 135*  BUN 28* 21*  CREATININE 1.04 1.23  CALCIUM 8.7* 8.3*   Liver Function Tests: No results for input(s): AST, ALT, ALKPHOS, BILITOT, PROT, ALBUMIN in the last 72 hours. No results for input(s): LIPASE, AMYLASE in the last 72 hours. CBC: No results for input(s): WBC, NEUTROABS, HGB, HCT, MCV, PLT in the last 72 hours. Cardiac Enzymes: No results for input(s): CKTOTAL, CKMB, CKMBINDEX, TROPONINI in the last 72 hours. BNP: Invalid input(s): POCBNP D-Dimer: No results for input(s): DDIMER in the last 72 hours. Hemoglobin A1C: No results for input(s): HGBA1C in the last 72 hours. Fasting Lipid Panel: No results for input(s): CHOL, HDL, LDLCALC, TRIG, CHOLHDL, LDLDIRECT in the last 72 hours. Thyroid Function Tests: No results for input(s): TSH, T4TOTAL, T3FREE, THYROIDAB in the last 72 hours.  Invalid input(s): FREET3 Anemia Panel: No results for input(s): VITAMINB12, FOLATE, FERRITIN, TIBC, IRON, RETICCTPCT in the last 72 hours.   PHYSICAL EXAM General: Well developed, well nourished, in no acute distress HEENT:  Normocephalic and atramatic Neck:  No JVD.  Lungs: Clear bilaterally to auscultation and percussion. Heart: HRRR . Normal S1 and S2 without gallops or murmurs.  Abdomen: Bowel sounds are positive,  abdomen soft and non-tender  Msk:  Back normal, normal gait. Normal strength and tone for age. Extremities: No clubbing, cyanosis or edema.   Neuro: Alert and oriented X 3. Psych:  Good affect, responds appropriately  TELEMETRY: Not on monitor  ASSESSMENT AND PLAN: Normal coronaries and normal LVEF. Noncardiac chest pain either due to gastritis/GERD/musculoskeletal pain/gallbladder disease.  Active Problems:   Sepsis ([place])    Dionisio David, MD, [facility] 06/21/2015 12:40 PM

## 2015-06-21 NOTE — Discharge Summary (Signed)
[place] [facility] [place] NAME: Anthony Fuller    MR#:  [id number]  DATE OF BIRTH:  08-18-1960  DATE OF ADMISSION:  06/16/2015 ADMITTING PHYSICIAN: Demetrios Loll, MD  DATE OF DISCHARGE: 06/21/2015  2:57 PM  PRIMARY CARE PHYSICIAN: Valera Castle, MD    ADMISSION DIAGNOSIS:  Sepsis, due to unspecified organism [facility]) [A41.9]  DISCHARGE DIAGNOSIS:  Active Problems:   Sepsis ([place])   SECONDARY DIAGNOSIS:   Past Medical History  Diagnosis Date  . Arthritis   . GERD (gastroesophageal reflux disease)   . Hypertension   . Diabetes mellitus without complication ([place])   . Cancer ([place]) 2012    R kidney removed  . 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  [place] Kitchen Anxiety     HOSPITAL COURSE:   1. Clinical sepsis suspected pyelonephritis. Urine culture grew out enterococcus. Patient was given IV Unasyn during the entire hospital course and be switched over to by mouth Augmentin upon discharge home. Urology will remove the stent as outpatient. 2. Continuous chest pain. Patient had continuous chest pain throughout the entire hospital course. Cardiac catheter was completely normal. VQ scan was negative.Empiric steroids did not help. patient is on pain medication.  prior imaging with CT scans of the abdomen did not show any cause of the patient's chest pain.  x-rays of the ribs and thoracic spine were negative I did an MRI of the thoracic spine which showed T7 and 8 minimal bulge on the left with slight narrowing of the ventral thecal sac with minimal left-sided cord contact. This could be causing the patient's reproducible chest pain wrapping around the left side. I did prescribe pain medications and a quick prednisone taper upon discharge home. I gave him a note off from work for 2 weeks from the admission date.I also increased the patient's Lyrica to 150 mg twice a day just in case this is shingles without a rash. 3. Cervical spondylitis  and degenerative disc disease causing impingement C5-C6, C6-C7, C3- C4 and C4 -C5. The patient follows outpatient with Dr. Babette Relic. I advised the patient follow up with him and review the MRI of the cervical spine with him. The patient does have good power upper and lower extremities and does not complain of any neck discomfort at this time.These findings would not cause the patient's continuous chest pain. Follow-up as outpatient with Dr. Babette Relic. 4. Type 2 diabetes restart oral medications hold Glucophage 5. gastroesophageal reflux disease on omeprazole 6. Anxiety- Xanax did not help the patient's chest pain  DISCHARGE CONDITIONS:   fair  CONSULTS OBTAINED:  Treatment Team:  Allyne Gee, MD Dionisio David, MD  DRUG ALLERGIES:   Allergies  Allergen Reactions  . Hydromorphone Other (See Comments)    Reaction:  Drops pts BP   . Floxin [Ofloxacin] Rash    DISCHARGE MEDICATIONS:   Discharge Medication List as of 06/21/2015  2:05 PM    START taking these medications   Details  predniSONE (DELTASONE) 5 MG tablet 3 tabs day1; 2 tabs day2; 1 tab day3,4 then stop, Print      CONTINUE these medications which have CHANGED   Details  amoxicillin-clavulanate (AUGMENTIN) 875-125 MG tablet Take 1 tablet by mouth every 12 (twelve) hours., Starting 06/21/2015, Until Discontinued, Print    HYDROcodone-acetaminophen (NORCO/VICODIN) 5-325 MG tablet Take 1 tablet by mouth every 6 (six) hours as needed for moderate pain., Starting 06/21/2015, Until Discontinued, Print  pregabalin (LYRICA) 150 MG capsule Take 1 capsule (150 mg total) by mouth 2 (two) times daily., Starting 06/21/2015, Until Discontinued, Print      CONTINUE these medications which have NOT CHANGED   Details  citalopram (CELEXA) 20 MG tablet Take 20 mg by mouth at bedtime. , Until Discontinued, Historical Med    dicyclomine (BENTYL) 10 MG capsule Take 10 mg by mouth 3 (three) times daily as needed for spasms. , Until Discontinued,  Historical Med    empagliflozin (JARDIANCE) 10 MG TABS tablet Take 10 mg by mouth every morning., Until Discontinued, Historical Med    loratadine (CLARITIN) 10 MG tablet Take 10 mg by mouth daily as needed for allergies. , Until Discontinued, Historical Med    omeprazole (PRILOSEC) 40 MG capsule Take 40 mg by mouth at bedtime. , Until Discontinued, Historical Med    senna (SENOKOT) 8.6 MG TABS tablet Take 1 tablet (8.6 mg total) by mouth daily., Starting 05/30/2015, Until Discontinued, Normal    tamsulosin (FLOMAX) 0.4 MG CAPS capsule Take 0.4 mg by mouth at bedtime. , Until Discontinued, Historical Med    traMADol (ULTRAM) 50 MG tablet Take 50 mg by mouth every 6 (six) hours as needed for moderate pain. for pain, Until Discontinued, Historical Med      STOP taking these medications     metFORMIN (GLUCOPHAGE-XR) 500 MG 24 hr tablet      terbinafine (LAMISIL) 250 MG tablet      tiZANidine (ZANAFLEX) 4 MG tablet      docusate sodium (COLACE) 100 MG capsule          DISCHARGE INSTRUCTIONS:   Follow-up with your medical doctor one week Follow-up with urology to take out the stent Follow-up with Dr. Babette Relic  If you experience worsening of your admission symptoms, develop shortness of breath, life threatening emergency, suicidal or homicidal thoughts you must seek medical attention immediately by calling 911 or calling your MD immediately  if symptoms less severe.  You Must read complete instructions/literature along with all the possible adverse reactions/side effects for all the Medicines you take and that have been prescribed to you. Take any new Medicines after you have completely understood and accept all the possible adverse reactions/side effects.   Please note  You were cared for by a hospitalist during your hospital stay. If you have any questions about your discharge medications or the care you received while you were in the hospital after you are discharged, you can call  the unit and asked to speak with the hospitalist on call if the hospitalist that took care of you is not available. Once you are discharged, your primary care physician will handle any further medical issues. Please note that NO REFILLS for any discharge medications will be authorized once you are discharged, as it is imperative that you return to your primary care physician (or establish a relationship with a primary care physician if you do not have one) for your aftercare needs so that they can reassess your need for medications and monitor your lab values.    Today   CHIEF COMPLAINT:   Chief Complaint  Patient presents with  . Chest Pain    HISTORY OF PRESENT ILLNESS:  Anthony Fuller  is a 55 y.o. male resented with continuous chest pain   VITAL SIGNS:  Blood pressure 146/83, pulse 59, temperature 98.4 F (36.9 C), temperature source Oral, resp. rate 18, height 6\' 2"  (1.88 m), weight 117.935 kg (260 lb), SpO2 98 %.  PHYSICAL EXAMINATION:  GENERAL:  55 y.o.-year-old patient lying in the bed with no acute distress.  EYES: Pupils equal, round, reactive to light and accommodation. No scleral icterus. Extraocular muscles intact.  HEENT: Head atraumatic, normocephalic. Oropharynx and nasopharynx clear.  NECK:  Supple, no jugular venous distention. No thyroid enlargement, no tenderness.  LUNGS: Normal breath sounds bilaterally, no wheezing, rales,rhonchi or crepitation. No use of accessory muscles of respiration.  CARDIOVASCULAR: S1, S2 normal. No murmurs, rubs, or gallops.  ABDOMEN: Soft, non-tender, non-distended. Bowel sounds present. No organomegaly or mass.  EXTREMITIES: trace edema, nocyanosis, or clubbing.  NEUROLOGIC: Cranial nerves II through XII are intact. Muscle strength 5/5 in all extremities. Sensation intact.  PSYCHIATRIC: The patient is alert and oriented x 3.  SKIN: No obvious rash, lesion, or ulcer.   DATA REVIEW:   CBC  Recent Labs Lab 06/18/15 0401  WBC  12.5*  HGB 12.0*  HCT 35.8*  PLT 113*    Chemistries   Recent Labs Lab 06/21/15 0527  NA 140  K 3.7  CL 105  CO2 28  GLUCOSE 135*  BUN 21*  CREATININE 1.23  CALCIUM 8.3*    Cardiac Enzymes  Recent Labs Lab 06/18/15 0401  TROPONINI 0.03    Microbiology Results  Results for orders placed or performed during the hospital encounter of 06/16/15  Culture, blood (routine x 2)     Status: None   Collection Time: 06/16/15  7:02 PM  Result Value Ref Range Status   Specimen Description BLOOD RIGHT WRIST  Final   Special Requests   Final    BOTTLES DRAWN AEROBIC AND ANAEROBIC  2CC AERO, 3CC ANA   Culture NO GROWTH 5 DAYS  Final   Report Status 06/21/2015 FINAL  Final  Culture, blood (routine x 2)     Status: None   Collection Time: 06/16/15  8:04 PM  Result Value Ref Range Status   Specimen Description BLOOD RIGHT ASSIST CONTROL  Final   Special Requests BOTTLES DRAWN AEROBIC AND ANAEROBIC 6CC  Final   Culture NO GROWTH 5 DAYS  Final   Report Status 06/21/2015 FINAL  Final  Urine culture     Status: None   Collection Time: 06/16/15  8:04 PM  Result Value Ref Range Status   Specimen Description URINE, CLEAN CATCH  Final   Special Requests NONE  Final   Culture 80,000 COLONIES/ml ENTEROCOCCUS FAECALIS  Final   Report Status 06/20/2015 FINAL  Final   Organism ID, Bacteria ENTEROCOCCUS FAECALIS  Final      Susceptibility   Enterococcus faecalis - MIC*    AMPICILLIN <=2 SENSITIVE Sensitive     LEVOFLOXACIN 1 SENSITIVE Sensitive     NITROFURANTOIN <=16 SENSITIVE Sensitive     VANCOMYCIN 1 SENSITIVE Sensitive     LINEZOLID 2 SENSITIVE Sensitive     * 80,000 COLONIES/ml ENTEROCOCCUS FAECALIS    RADIOLOGY:  Mr Cervical Spine Wo Contrast  06/21/2015  CLINICAL DATA:  Neck pain radiating to both shoulders and down the left arm for the past 2 months. EXAM: MRI CERVICAL SPINE WITHOUT CONTRAST TECHNIQUE: Multiplanar, multisequence MR imaging of the cervical spine was performed.  No intravenous contrast was administered. COMPARISON:  None. FINDINGS: The craniocervical junction appears unremarkable. There is 1.5 mm degenerative retrolisthesis at C6-7. No significant abnormal spinal cord signal is observed. No significant vertebral marrow edema is identified. Intervertebral disc desiccation is observed at all levels in the cervical spine with loss of disc height most notable at C6-7. Additional  findings at individual levels are as follows: C2-3:  Unremarkable. C3-4: Mild to moderate bilateral foraminal stenosis and borderline central narrowing of the thecal sac due to disc bulge, uncinate spurring, and facet arthropathy. C4-5: Mild to moderate bilateral foraminal stenosis and borderline central narrowing of the thecal sac due to disc bulge, uncinate spurring, and facet arthropathy. C5-6: Prominent central narrowing of the thecal sac with prominent bilateral foraminal stenosis due to disc bulge, posterior osseous ridging, uncinate spurring, and facet arthropathy. AP diameter of the thecal sac narrowed to 5 mm. No cord edema. C6-7: Moderate to severe bilateral foraminal stenosis and moderate left eccentric central narrowing of the thecal sac due to posterior osseous ridging, disc bulge, left paracentral disc protrusion, uncinate spurring, and facet arthropathy. C7-T1:  Unremarkable. IMPRESSION: 1. Cervical spondylosis and degenerative disc disease, causing prominent impingement at C5-6 ; moderate to prominent impingement at C6-7 ; and mild to moderate impingement at C3-4 and C4-5, as detailed above. Electronically Signed   By: Van Clines M.D.   On: 06/21/2015 11:23   Mr Thoracic Spine Wo Contrast  06/20/2015  CLINICAL DATA:  55 year old male with back pain and left rib pain. Subsequent encounter. EXAM: MRI THORACIC SPINE WITHOUT CONTRAST TECHNIQUE: Multiplanar, multisequence MR imaging of the thoracic spine was performed. No intravenous contrast was administered. COMPARISON:   06/19/2015 plain film exam. 05/02/2012 thoracic spine MR. FINDINGS: On the single sagittal scout exam obtained through the cervical spine for proper level assignment, C5-6 cervical spondylotic changes with spinal stenosis and foraminal narrowing is noted but incompletely assessed. MR cervical spine would be necessary for further delineation. Very small bilateral pleural effusions. Limited evaluation of mediastinal structures. No worrisome osseous lesion. No focal thoracic cord signal abnormality. T1-2 through T4-5 are unremarkable. T5-6:  Minimal bulge. T6-7: Minimal Schmorl's node deformity. Minimal bulge greater to the right. Slight narrowing ventral thecal sac. Minimal right-sided cord contact. T7-8: Schmorl's Schmorl's node deformity. Minimal bulge greater to left. Slight narrowing ventral thecal sac with minimal left-sided cord contact. T8-9: Minimal Schmorl's node deformity.  Minimal bulge. T9-10:  Minimal Schmorl's node deformity.  Minimal bulge. T10-11: Small Schmorl's node deformity. Minimal bulge. Minimal narrowing ventral thecal sac. Minimal posterior element hypertrophy. T10-11: Minimal Schmorl's node deformity. T12-L1:  Small Schmorl's node deformity.  Minimal bulge. IMPRESSION: Minimal degenerative changes mid to lower thoracic spine as detailed above. On the left most notable finding is at the T7-8 level. C5-6 cervical spondylotic changes with spinal stenosis and foraminal narrowing noted but incompletely assessed. MR cervical spine would be necessary for further delineation. Very small bilateral pleural effusions. Electronically Signed   By: Genia Del M.D.   On: 06/20/2015 16:27   Nm Pulmonary Perf And Vent  06/20/2015  CLINICAL DATA:  Chest pain. EXAM: NUCLEAR MEDICINE VENTILATION - PERFUSION LUNG SCAN TECHNIQUE: Ventilation images were obtained in multiple projections using inhaled aerosol Tc-67m DTPA. Perfusion images were obtained in multiple projections after intravenous injection of  Tc-19m MAA. RADIOPHARMACEUTICALS:  [id number] millicuries [id number] DTPA aerosol inhalation and 4.1 millicuries [id number] MAA IV COMPARISON:  None. FINDINGS: Ventilation: No focal ventilation defect. Perfusion: No wedge shaped peripheral perfusion defects to suggest acute pulmonary embolism. IMPRESSION: No evidence of pulmonary embolus. Electronically Signed   By: Rolm Baptise M.D.   On: 06/20/2015 14:30    Management plans discussed with the patient, family and he is in agreement.  CODE STATUS:     Code Status Orders        Start     Ordered  06/16/15 2229  Full code   Continuous     06/16/15 2228    Code Status History    Date Active Date Inactive Code Status Order ID Comments User Context   05/27/2015  6:34 PM 05/30/2015  9:56 PM Full Code [id number]  Vaughan Basta, MD Inpatient      TOTAL TIME TAKING CARE OF THIS PATIENT: 63minutes   Milas Hock.D on 06/21/2015 at 4:32 PM  Between 7am to 6pm - Pager - [phone]  After 6pm go to [email or web address] - password [email or web address] [email or web address]  Office  [phone]  CC: Primary care physician; Valera Castle, MD

## 2015-06-24 ENCOUNTER — Encounter: Payer: Self-pay | Admitting: *Deleted

## 2015-06-24 DIAGNOSIS — I129 Hypertensive chronic kidney disease with stage 1 through stage 4 chronic kidney disease, or unspecified chronic kidney disease: Secondary | ICD-10-CM | POA: Diagnosis not present

## 2015-06-24 DIAGNOSIS — N182 Chronic kidney disease, stage 2 (mild): Secondary | ICD-10-CM | POA: Diagnosis not present

## 2015-06-24 DIAGNOSIS — Z792 Long term (current) use of antibiotics: Secondary | ICD-10-CM | POA: Insufficient documentation

## 2015-06-24 DIAGNOSIS — I861 Scrotal varices: Secondary | ICD-10-CM | POA: Insufficient documentation

## 2015-06-24 DIAGNOSIS — Z79899 Other long term (current) drug therapy: Secondary | ICD-10-CM | POA: Diagnosis not present

## 2015-06-24 DIAGNOSIS — M4802 Spinal stenosis, cervical region: Secondary | ICD-10-CM | POA: Insufficient documentation

## 2015-06-24 DIAGNOSIS — E119 Type 2 diabetes mellitus without complications: Secondary | ICD-10-CM | POA: Diagnosis not present

## 2015-06-24 DIAGNOSIS — N200 Calculus of kidney: Secondary | ICD-10-CM | POA: Diagnosis not present

## 2015-06-24 DIAGNOSIS — R319 Hematuria, unspecified: Secondary | ICD-10-CM | POA: Diagnosis present

## 2015-06-24 LAB — BASIC METABOLIC PANEL
Anion gap: 10 (ref 5–15)
BUN: 27 mg/dL — ABNORMAL HIGH (ref 6–20)
CO2: 26 mmol/L (ref 22–32)
Calcium: 9.8 mg/dL (ref 8.9–10.3)
Chloride: 102 mmol/L (ref 101–111)
Creatinine, Ser: 1.38 mg/dL — ABNORMAL HIGH (ref 0.61–1.24)
GFR calc Af Amer: >60 mL/min (ref >60)
GFR calc non Af Amer: 57 mL/min — ABNORMAL LOW (ref >60)
Glucose, Bld: 241 mg/dL — ABNORMAL HIGH (ref 65–99)
Potassium: 4.6 mmol/L (ref 3.5–5.1)
Sodium: 138 mmol/L (ref 135–145)

## 2015-06-24 LAB — URINALYSIS COMPLETE WITH MICROSCOPIC (ARMC ONLY)
Bacteria, UA: NONE SEEN
Bilirubin Urine: NEGATIVE
Glucose, UA: >500 mg/dL — AB
Ketones, ur: NEGATIVE mg/dL
Leukocytes, UA: NEGATIVE
Nitrite: NEGATIVE
Protein, ur: 30 mg/dL — AB
Specific Gravity, Urine: 1.025 (ref 1.005–1.030)
Squamous Epithelial / LPF: NONE SEEN
pH: 6 (ref 5.0–8.0)

## 2015-06-24 LAB — CBC
HCT: 47 % (ref 40.0–52.0)
Hemoglobin: 15.6 g/dL (ref 13.0–18.0)
MCH: 29 pg (ref 26.0–34.0)
MCHC: 33.3 g/dL (ref 32.0–36.0)
MCV: 87 fL (ref 80.0–100.0)
Platelets: 239 10*3/uL (ref 150–440)
RBC: 5.4 MIL/uL (ref 4.40–5.90)
RDW: 14.1 % (ref 11.5–14.5)
WBC: 10.9 10*3/uL — ABNORMAL HIGH (ref 3.8–10.6)

## 2015-06-24 NOTE — ED Notes (Signed)
Pt presents w/ c/o new onset of hematuria today. Pt has a ureteral stent placed 05/18/15 after lithotripsy. Pt recently hospitalized for urosepsis, discharged 5 days ago. Pt c/o LLQ pain and L flank pain. Pt continues on abx and steroid treatment.

## 2015-06-25 ENCOUNTER — Emergency Department
Admission: EM | Admit: 2015-06-25 | Discharge: 2015-06-25 | Disposition: A | Discharge status: Home / Self Care | Payer: 59 | Attending: Emergency Medicine | Admitting: Emergency Medicine

## 2015-06-25 ENCOUNTER — Emergency Department: Payer: 59

## 2015-06-25 DIAGNOSIS — N200 Calculus of kidney: Secondary | ICD-10-CM

## 2015-06-25 DIAGNOSIS — N50812 Left testicular pain: Secondary | ICD-10-CM

## 2015-06-25 DIAGNOSIS — I861 Scrotal varices: Secondary | ICD-10-CM

## 2015-06-25 MED ORDER — ONDANSETRON HCL 4 MG/2ML IJ SOLN
4.0000 mg | Freq: Once | INTRAMUSCULAR | Status: AC
  Administered 2015-06-25: 4 mg via INTRAVENOUS

## 2015-06-25 MED ORDER — MORPHINE SULFATE (PF) 4 MG/ML IV SOLN
4.0000 mg | Freq: Once | INTRAVENOUS | Status: AC
  Administered 2015-06-25: 4 mg via INTRAVENOUS

## 2015-06-25 MED ORDER — ONDANSETRON HCL 4 MG/2ML IJ SOLN: 4.0000 mg | Freq: Once | INTRAMUSCULAR | Status: DC

## 2015-06-25 MED ORDER — MORPHINE SULFATE (PF) 4 MG/ML IV SOLN: 4.0000 mg | Freq: Once | INTRAVENOUS | Status: DC

## 2015-06-25 MED ORDER — ONDANSETRON HCL 4 MG/2ML IJ SOLN
INTRAMUSCULAR | Status: AC
  Filled 2015-06-25: qty 2

## 2015-06-25 MED ORDER — MORPHINE SULFATE (PF) 4 MG/ML IV SOLN
INTRAVENOUS | Status: AC
  Filled 2015-06-25: qty 1

## 2015-06-25 NOTE — ED Provider Notes (Addendum)
[facility] Emergency Department Provider Note  ____________________________________________  Time seen: 3:30 AM  I have reviewed the triage vital signs and the nursing notes.   HISTORY  Chief Complaint Hematuria      HPI Anthony Fuller is a 55 y.o. male presents with left flank pain and hematuria with onset today. Of note patient has a history of urethral stent by Dr. Pilar Jarvis on 05/18/2015 after lithotripsy. Patient has been hospitalized twice for urosepsis recently discharged 5 days ago for before mentioned. Patient is currently taking antibiotic and prednisone. Patient denies any fever at this time no nausea or vomiting. Patient also admits to left testicular discomfort. Current pain score 7 out of 10    Past Medical History  Diagnosis Date  . Arthritis   . GERD (gastroesophageal reflux disease)   . Hypertension   . Diabetes mellitus without complication ([place])   . Cancer ([place]) 2012    R kidney removed  . 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  Marland Kitchen Anxiety     Patient Active Problem List   Diagnosis Date Noted  . Sepsis ([place]) 06/16/2015  . Left sided abdominal pain 05/30/2015  . Chest pain 05/30/2015  . CKD (chronic kidney disease), 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  . Hyponatremia 05/30/2015  . Leukocytosis 05/30/2015  . DM (diabetes mellitus) type 2, uncontrolled, with ketoacidosis ([place]) 05/30/2015  . Solitary kidney 05/30/2015  . Acute pyelonephritis 05/27/2015  . Pain due to ureteral stent ([place]) 05/27/2015  . Enthesopathy of hip 04/11/2015  . Hepatic cirrhosis ([place]) 09/12/2014  . Fatty liver disease, nonalcoholic [id number]  . Cervical pain 08/06/2014  . Clostridium difficile infection 02/04/2014  . D (diarrhea) 12/17/2013  . Arthralgia of hip 08/26/2013  . H/O urinary disorder 06/29/2013  . Acid indigestion 02/14/2012  . LBP (low back pain) 12/18/2011  . 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 Lumbar radiculopathy  09/05/2011  . Degeneration of intervertebral disc of lumbar region 08/23/2011  . Lumbar canal stenosis 08/23/2011  . 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 Thoracic and lumbosacral neuritis 07/24/2011  . Diabetes mellitus ([place]) 07/24/2011    Past Surgical History  Procedure Laterality Date  . Spine surgery      lumbar 2013  . Kidney removed Right     2012  . Back surgery    . Nephrectomy Right 04/2011  . Ureteroscopy with holmium laser lithotripsy Left 05/18/2015    Procedure: URETEROSCOPY WITH HOLMIUM LASER LITHOTRIPSY;  Surgeon: Nickie Retort, MD;  Location: [facility] ORS;  Service: Urology;  Laterality: Left;  . Cystoscopy w/ retrogrades Left 05/18/2015    Procedure: CYSTOSCOPY WITH RETROGRADE PYELOGRAM;  Surgeon: Nickie Retort, MD;  Location: [facility] ORS;  Service: Urology;  Laterality: Left;  . Cystoscopy with stent placement Left 05/18/2015    Procedure: CYSTOSCOPY WITH STENT PLACEMENT;  Surgeon: Nickie Retort, MD;  Location: [facility] ORS;  Service: Urology;  Laterality: Left;  . Cardiac catheterization Right 06/20/2015    Procedure: Left Heart Cath and Coronary Angiography;  Surgeon: Dionisio David, MD;  Location: [facility];  Service: Cardiovascular;  Laterality: Right;    Current Outpatient Rx  Name  Route  Sig  Dispense  Refill  . amoxicillin-clavulanate (AUGMENTIN) 875-125 MG tablet   Oral   Take 1 tablet by mouth every 12 (twelve) hours.   14 tablet   0   . citalopram (CELEXA) 20 MG tablet   Oral   Take  20 mg by mouth at bedtime.          . dicyclomine (BENTYL) 10 MG capsule   Oral   Take 10 mg by mouth 3 (three) times daily as needed for spasms.          . empagliflozin (JARDIANCE) 10 MG TABS tablet   Oral   Take 10 mg by mouth every morning.         Marland Kitchen HYDROcodone-acetaminophen (NORCO/VICODIN) 5-325 MG tablet   Oral   Take 1 tablet by mouth every 6 (six) hours as needed for moderate pain.   40 tablet   0   . loratadine  (CLARITIN) 10 MG tablet   Oral   Take 10 mg by mouth daily as needed for allergies.          Marland Kitchen omeprazole (PRILOSEC) 40 MG capsule   Oral   Take 40 mg by mouth at bedtime.          . predniSONE (DELTASONE) 5 MG tablet      3 tabs day1; 2 tabs day2; 1 tab day3,4 then stop   7 tablet   0   . pregabalin (LYRICA) 150 MG capsule   Oral   Take 1 capsule (150 mg total) by mouth 2 (two) times daily.   60 capsule   0   . senna (SENOKOT) 8.6 MG TABS tablet   Oral   Take 1 tablet (8.6 mg total) by mouth daily. Patient taking differently: Take 1 tablet by mouth at bedtime.    120 each   0   . tamsulosin (FLOMAX) 0.4 MG CAPS capsule   Oral   Take 0.4 mg by mouth at bedtime.          . traMADol (ULTRAM) 50 MG tablet   Oral   Take 50 mg by mouth every 6 (six) hours as needed for moderate pain. for pain      1     Allergies Hydromorphone and Floxin  Family History  Problem Relation Age of Onset  . Diabetes Mother   . Hypertension Mother   . Asthma Mother   . Heart disease Father   . Kidney disease Father   . Diabetes Father   . Stroke Father     Social History Social History  Substance Use Topics  . Smoking status: Never Smoker   . Smokeless tobacco: Never Used  . Alcohol Use: No    Review of Systems  Constitutional: Negative for fever. Eyes: Negative for visual changes. ENT: Negative for sore throat. Cardiovascular: Negative for chest pain. Respiratory: Negative for shortness of breath. Gastrointestinal: Negative for abdominal pain, vomiting and diarrhea. Genitourinary: Negative for dysuria. Musculoskeletal: Negative for back pain. Positive for left flank pain Skin: Negative for rash. Neurological: Negative for headaches, focal weakness or numbness.   10-point ROS otherwise negative.  ____________________________________________   PHYSICAL EXAM:  VITAL SIGNS: ED Triage Vitals  Enc Vitals Group     BP 06/24/15 2305 123/86 mmHg     Pulse Rate  06/24/15 2305 73     Resp 06/24/15 2305 16     Temp 06/24/15 2305 98.1 F (36.7 C)     Temp Source 06/24/15 2305 Oral     SpO2 06/24/15 2305 97 %     Weight 06/24/15 2305 265 lb (120.203 kg)     Height 06/24/15 2305 6\' 2"  (1.88 m)     Head Cir --      Peak Flow --  Pain Score 06/24/15 2306 7     Pain Loc --      Pain Edu? --      Excl. in [place]? --      Constitutional: Alert and oriented. Well appearing and in no distress. Eyes: Conjunctivae are normal. PERRL. Normal extraocular movements. ENT   Head: Normocephalic and atraumatic.   Nose: No congestion/rhinnorhea.   Mouth/Throat: Mucous membranes are moist.   Neck: No stridor. Hematological/Lymphatic/Immunilogical: No cervical lymphadenopathy. Cardiovascular: Normal rate, regular rhythm. Normal and symmetric distal pulses are present in all extremities. No murmurs, rubs, or gallops. Respiratory: Normal respiratory effort without tachypnea nor retractions. Breath sounds are clear and equal bilaterally. No wheezes/rales/rhonchi. Gastrointestinal: Soft and nontender. No distention. There is no CVA tenderness. Genitourinary: Left testicular pain with mild palpation Musculoskeletal: Nontender with normal range of motion in all extremities. No joint effusions.  No lower extremity tenderness nor edema. Neurologic:  Normal speech and language. No gross focal neurologic deficits are appreciated. Speech is normal.  Skin:  Skin is warm, dry and intact. No rash noted. Psychiatric: Mood and affect are normal. Speech and behavior are normal. Patient exhibits appropriate insight and judgment.  ____________________________________________    LABS (pertinent positives/negatives)  Labs Reviewed  URINALYSIS COMPLETEWITH MICROSCOPIC ([facility] ONLY) - Abnormal; Notable for the following:    Color, Urine RED (*)    APPearance CLOUDY (*)    Glucose, UA >500 (*)    Hgb urine dipstick 3+ (*)    Protein, ur 30 (*)    All other  components within normal limits  CBC - Abnormal; Notable for the following:    WBC 10.9 (*)    All other components within normal limits  BASIC METABOLIC PANEL - Abnormal; Notable for the following:    Glucose, Bld 241 (*)    BUN 27 (*)    Creatinine, Ser 1.38 (*)    GFR calc non Af Amer 57 (*)    All other components within normal limits    ED ECG REPORT I, Darriona Dehaas, [place], the attending physician, personally viewed and interpreted this ECG.   Date: 06/25/2015  EKG Time: 3:34AM  Rate: 67  Rhythm: Normal Sinus rhythm  Axis: Normal  Intervals: Normal  ST&T Change: None     RADIOLOGY    US Scrotum (Final result) Result time: 06/25/15 06:05:05   Final result by Rad Results In Interface (06/25/15 06:05:05)   Narrative:   CLINICAL DATA: Subacute onset of left testicular pain and left lower quadrant abdominal pain. Left flank pain. Initial encounter.  EXAM: SCROTAL ULTRASOUND  DOPPLER ULTRASOUND OF THE TESTICLES  TECHNIQUE: Complete ultrasound examination of the testicles, epididymis, and other scrotal structures was performed. Color and spectral Doppler ultrasound were also utilized to evaluate blood flow to the testicles.  COMPARISON: None.  FINDINGS: Right testicle  Measurements: 4.2 x 1.8 x 2.4 cm. No mass or microlithiasis visualized.  Left testicle  Measurements: 3.5 x 2.3 x 3.3 cm. No mass or microlithiasis visualized.  Right epididymis: Normal in size and appearance.  Left epididymis: Normal in size and appearance.  Hydrocele: None visualized.  Varicocele: A large left-sided varicocele is noted, augmented on Valsalva maneuver.  Pulsed Doppler interrogation of both testes demonstrates normal low resistance arterial and venous waveforms bilaterally.  IMPRESSION: 1. No evidence of testicular torsion. 2. Large patent left-sided varicocele noted, which likely corresponds to the patient's symptoms.   Electronically Signed By:  Garald Balding M.D. On: 06/25/2015 06:05  [place] Art/Ven Flow Abd Pelv Doppler (Final result) Result time: 06/25/15 06:05:05   Final result by Rad Results In Interface (06/25/15 06:05:05)   Narrative:   CLINICAL DATA: Subacute onset of left testicular pain and left lower quadrant abdominal pain. Left flank pain. Initial encounter.  EXAM: SCROTAL ULTRASOUND  DOPPLER ULTRASOUND OF THE TESTICLES  TECHNIQUE: Complete ultrasound examination of the testicles, epididymis, and other scrotal structures was performed. Color and spectral Doppler ultrasound were also utilized to evaluate blood flow to the testicles.  COMPARISON: None.  FINDINGS: Right testicle  Measurements: 4.2 x 1.8 x 2.4 cm. No mass or microlithiasis visualized.  Left testicle  Measurements: 3.5 x 2.3 x 3.3 cm. No mass or microlithiasis visualized.  Right epididymis: Normal in size and appearance.  Left epididymis: Normal in size and appearance.  Hydrocele: None visualized.  Varicocele: A large left-sided varicocele is noted, augmented on Valsalva maneuver.  Pulsed Doppler interrogation of both testes demonstrates normal low resistance arterial and venous waveforms bilaterally.  IMPRESSION: 1. No evidence of testicular torsion. 2. Large patent left-sided varicocele noted, which likely corresponds to the patient's symptoms.   Electronically Signed By: Garald Balding M.D. On: 06/25/2015 06:05          CT Renal Stone Study (Final result) Result time: 06/25/15 04:44:21   Final result by Rad Results In Interface (06/25/15 04:44:21)   Narrative:   CLINICAL DATA: Initial evaluation for new onset hematuria, left lower quadrant and left flank pain. Recent ureteral stent placement and lithotripsy.  EXAM: CT ABDOMEN AND PELVIS WITHOUT CONTRAST  TECHNIQUE: Multidetector CT imaging of the abdomen and pelvis was performed following the standard protocol without IV  contrast.  COMPARISON: Prior study from 05/27/2015.  FINDINGS: Visualized lung bases are clear without acute process. Calcified granuloma noted at the right lung base. Additional small granuloma within the lingula.  Limited noncontrast evaluation of the liver is unremarkable. Gallbladder within normal limits. No biliary dilatation. Spleen, adrenal glands, and pancreas demonstrate a normal unenhanced appearance.  Patient is status post right nephrectomy. No abnormality or soft tissue mass within the right renal fossa.  Double-J ureteral stent in place within the left ureter, and appears to being good position. There are residual calculi measuring up to 1 cm within the lower pole of the left kidney. Additional faint 3 mm calculus slightly more superiorly within the interpolar region. Overall, stone burden may be slightly decreased from prior study, alternatively, it may be that the previously seen stones of all migrated into the lower pole calyx. There is no hydronephrosis. Mild left perinephric fat stranding is similar to previous. No definite stones along the left ureter or within the bladder.  Stomach within normal limits. No evidence for bowel obstruction. No acute inflammatory changes about the bowels.  Appendix is normal.  Bladder within normal limits. Prostate normal.  Fact containing right inguinal hernia noted. No free air no free fluid. No pathologically enlarged intra-abdominal or pelvic lymph nodes identified.  No acute osseus abnormality. No worrisome lytic or blastic osseous lesions.  IMPRESSION: 1. Left-sided double-J ureteral stent in good position without complicating features. No hydronephrosis. 2. Residual left renal calculi as above. No ureteral or bladder calculi identified. 3. Status post right nephrectomy. 4. No other acute intra-abdominal or pelvic process.   Electronically Signed By: Jeannine Boga M.D. On: 06/25/2015 04:44        INITIAL IMPRESSION / ASSESSMENT AND PLAN / ED COURSE  Pertinent labs & imaging results that were available during my care of  the patient were reviewed by me and considered in my medical decision making (see chart for details).  Patient informed of all clinical findings including that of CT scan of the abdomen and ultrasound of the scrotum. Patient being for referred to Dr. Pilar Jarvis ____________________________________________   FINAL CLINICAL IMPRESSION(S) / ED DIAGNOSES  Final diagnoses:  Left varicocele  Kidney stone on left side      Gregor Hams, MD 06/25/15 [place], MD 06/25/15 [phone]

## 2015-06-25 NOTE — Discharge Instructions (Signed)
Kidney Stones Kidney stones (urolithiasis) are deposits that form inside your kidneys. The intense pain is caused by the stone moving through the urinary tract. When the stone moves, the ureter goes into spasm around the stone. The stone is usually passed in the urine.  CAUSES   A disorder that makes certain neck glands produce too much parathyroid hormone (primary hyperparathyroidism).  A buildup of uric acid crystals, similar to gout in your joints.  Narrowing (stricture) of the ureter.  A kidney obstruction present at birth (congenital obstruction).  Previous surgery on the kidney or ureters.  Numerous kidney infections. SYMPTOMS   Feeling sick to your stomach (nauseous).  Throwing up (vomiting).  Blood in the urine (hematuria).  Pain that usually spreads (radiates) to the groin.  Frequency or urgency of urination. DIAGNOSIS   Taking a history and physical exam.  Blood or urine tests.  CT scan.  Occasionally, an examination of the inside of the urinary bladder (cystoscopy) is performed. TREATMENT   Observation.  Increasing your fluid intake.  Extracorporeal shock wave lithotripsy--This is a noninvasive procedure that uses shock waves to break up kidney stones.  Surgery may be needed if you have severe pain or persistent obstruction. There are various surgical procedures. Most of the procedures are performed with the use of small instruments. Only small incisions are needed to accommodate these instruments, so recovery time is minimized. The size, location, and chemical composition are all important variables that will determine the proper choice of action for you. Talk to your health care provider to better understand your situation so that you will minimize the risk of injury to yourself and your kidney.  HOME CARE INSTRUCTIONS   Drink enough water and fluids to keep your urine clear or pale yellow. This will help you to pass the stone or stone fragments.  Strain  all urine through the provided strainer. Keep all particulate matter and stones for your health care provider to see. The stone causing the pain may be as small as a grain of salt. It is very important to use the strainer each and every time you pass your urine. The collection of your stone will allow your health care provider to analyze it and verify that a stone has actually passed. The stone analysis will often identify what you can do to reduce the incidence of recurrences.  Only take over-the-counter or prescription medicines for pain, discomfort, or fever as directed by your health care provider.  Keep all follow-up visits as told by your health care provider. This is important.  Get follow-up X-rays if required. The absence of pain does not always mean that the stone has passed. It may have only stopped moving. If the urine remains completely obstructed, it can cause loss of kidney function or even complete destruction of the kidney. It is your responsibility to make sure X-rays and follow-ups are completed. Ultrasounds of the kidney can show blockages and the status of the kidney. Ultrasounds are not associated with any radiation and can be performed easily in a matter of minutes.  Make changes to your daily diet as told by your health care provider. You may be told to:  Limit the amount of salt that you eat.  Eat 5 or more servings of fruits and vegetables each day.  Limit the amount of meat, poultry, fish, and eggs that you eat.  Collect a 24-hour urine sample as told by your health care provider.You may need to collect another urine sample every 6-12  months. SEEK MEDICAL CARE IF:  You experience pain that is progressive and unresponsive to any pain medicine you have been prescribed. SEEK IMMEDIATE MEDICAL CARE IF:   Pain cannot be controlled with the prescribed medicine.  You have a fever or shaking chills.  The severity or intensity of pain increases over 18 hours and is not  relieved by pain medicine.  You develop a new onset of abdominal pain.  You feel faint or pass out.  You are unable to urinate.   This information is not intended to replace advice given to you by your health care provider. Make sure you discuss any questions you have with your health care provider.   Document Released: 04/30/2005 Document Revised: 01/19/2015 Document Reviewed: 10/01/2012 Elsevier Interactive Patient Education 2016 [employer].  Varicocele A varicocele is a swelling of veins in the scrotum. The scrotum is the sac that contains the testicles. Varicoceles can occur on either side of the scrotum, but they are more common on the left side. They occur most often in teenage boys and young men. In most cases, varicoceles are not a serious problem. They are usually small and painless and do not require treatment. Tests may be done to confirm the diagnosis. Treatment may be needed if:  A varicocele is large, causes a lot of pain, or causes pain when exercising.  Varicoceles are found on both sides of the scrotum.  The testicle on the opposite side is absent or not normal.  A varicocele causes a decrease in the size of the testicle in a growing adolescent.  The person has fertility problems. CAUSES This condition is the result of valves in the veins not working properly. Valves in the veins help to return blood from the scrotum and testicles to the heart. If these valves do not work well, blood flows backward and backs up into the veins, which causes the veins to swell. This is similar to what happens when varicose veins form in the leg. SYMPTOMS Most varicoceles do not cause any symptoms. If symptoms do occur, they may include:  Swelling on one side of the scrotum. The swelling may be more obvious when you are standing up.  A lumpy feeling in the scrotum.  A heavy feeling on one side of the scrotum.  A dull ache in the scrotum, especially after exercise or prolonged  standing or sitting.  Slower growth or reduced size of the testicle on the side of the varicocele (in young males).  Problems with fertility. These can occur if the testicle does not grow normally. DIAGNOSIS This condition may be diagnosed with a physical exam. You may also have an imaging test, called an ultrasound, to confirm the diagnosis and to help rule out other causes of the swelling. TREATMENT Treatment is usually not needed for this condition. If you have any pain, your health care provider may prescribe or recommend medicine to help relieve it. You may need regular exams so your health care provider can monitor the varicocele to ensure that it does not cause problems. When further treatment is needed, it may involve one of these options:  Varicocelectomy. This is a surgery in which the swollen veins are tied off so that the flow of blood goes to other veins instead.  Embolization. In this procedure, a small tube (catheter) is used to place metal coils or other blocking items in the veins. This cuts off the blood flow to the swollen veins. HOME CARE INSTRUCTIONS  Take medicines only as  directed by your health care provider.  Wear supportive underwear.  Use an athletic supporter for sports.  Keep all follow-up visits as directed by your health care provider. This is important. SEEK MEDICAL CARE IF:  Your pain is increasing.  You have redness in the affected area.  You have swelling that does not decrease when you are lying down.  One of your testicles is smaller than the other.  Your testicle becomes enlarged, swollen, or painful.   This information is not intended to replace advice given to you by your health care provider. Make sure you discuss any questions you have with your health care provider.   Document Released: 08/06/2000 Document Revised: 09/14/2014 Document Reviewed: 04/07/2014 Elsevier Interactive Patient Education [employer].

## 2015-06-27 ENCOUNTER — Encounter: Payer: Self-pay | Admitting: Urology

## 2015-06-27 ENCOUNTER — Ambulatory Visit (INDEPENDENT_AMBULATORY_CARE_PROVIDER_SITE_OTHER): Payer: 59 | Admitting: Urology

## 2015-06-27 ENCOUNTER — Telehealth: Payer: Self-pay | Admitting: Urology

## 2015-06-27 VITALS — BP 132/82 | HR 76 | Ht 74.0 in | Wt 260.0 lb

## 2015-06-27 DIAGNOSIS — N200 Calculus of kidney: Secondary | ICD-10-CM | POA: Diagnosis not present

## 2015-06-27 MED ORDER — AMOXICILLIN-POT CLAVULANATE 875-125 MG PO TABS: 1.0000 | ORAL_TABLET | Freq: Two times a day (BID) | ORAL | Status: DC

## 2015-06-27 NOTE — Progress Notes (Signed)
55 year old male who presents today in follow-up. The patient is status post left ureteroscopy in early January for left-sided nonobstructing nephrolithiasis. Following the procedure the patient developed pyelonephritis and as such, the stent was not removed as scheduled one week following the operation. He has since been hospitalized twice for enterococcus UTIs. At his last admission he was also having unassociated chest pain and underwent a cardiac catheterization which was noted to Be completely normal. The patient was subsequently discharged and scheduled for follow-up in urology. In the meantime however, the patient developed gross hematuria and left testicular pain. He was then re-seen at the emergency room where a CT scan was performed which demonstrated a small stone in the left lower pole as well as a additional 3 mm calcification in the interpolar region. Currently, the patient is complaining of fatigue, intermittent diaphoresis, and general malaise. His hematuria seemed to resolve. He denies any dysuria. He has persistent left scrotal pain, ultrasound performed in the emergency department demonstrated a left varicocele. He denies any fevers.  The patient's past medical area significant for right radical nephrectomy for RCC.  Current Outpatient Prescriptions on File Prior to Visit  Medication Sig Dispense Refill  . dicyclomine (BENTYL) 10 MG capsule Take 10 mg by mouth 3 (three) times daily as needed for spasms.     . empagliflozin (JARDIANCE) 10 MG TABS tablet Take 10 mg by mouth every morning.    Marland Kitchen omeprazole (PRILOSEC) 40 MG capsule Take 40 mg by mouth at bedtime.     . pregabalin (LYRICA) 150 MG capsule Take 1 capsule (150 mg total) by mouth 2 (two) times daily. 60 capsule 0  . tamsulosin (FLOMAX) 0.4 MG CAPS capsule Take 0.4 mg by mouth at bedtime.     . traMADol (ULTRAM) 50 MG tablet Take 50 mg by mouth every 6 (six) hours as needed for moderate pain. for pain  1  . citalopram (CELEXA)  20 MG tablet Take 20 mg by mouth at bedtime. Reported on 06/27/2015    . HYDROcodone-acetaminophen (NORCO/VICODIN) 5-325 MG tablet Take 1 tablet by mouth every 6 (six) hours as needed for moderate pain. (Patient not taking: Reported on 06/27/2015) 40 tablet 0  . loratadine (CLARITIN) 10 MG tablet Take 10 mg by mouth daily as needed for allergies. Reported on 06/27/2015    . predniSONE (DELTASONE) 5 MG tablet 3 tabs day1; 2 tabs day2; 1 tab day3,4 then stop (Patient not taking: Reported on 06/27/2015) 7 tablet 0  . senna (SENOKOT) 8.6 MG TABS tablet Take 1 tablet (8.6 mg total) by mouth daily. (Patient not taking: Reported on 06/27/2015) 120 each 0   No current facility-administered medications on file prior to visit.   Past Medical History  Diagnosis Date  . Arthritis   . GERD (gastroesophageal reflux disease)   . Hypertension   . Diabetes mellitus without complication ([place])   . Cancer ([place]) 2012    R kidney removed  . 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  Marland Kitchen Anxiety   . Clostridium difficile infection 02/04/2014  . Fatty liver disease, nonalcoholic [id number]  . Hepatic cirrhosis ([place]) 09/12/2014  . Diabetes mellitus ([place]) 07/24/2011  . DM (diabetes mellitus) type 2, uncontrolled, with ketoacidosis ([place]) 05/30/2015  . Lumbar radiculopathy 09/05/2011  . Thoracic and lumbosacral neuritis 07/24/2011  . 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 Degeneration of intervertebral disc of lumbar region 08/23/2011  . Enthesopathy of hip 04/11/2015  . Acid indigestion 02/14/2012  PE: Filed Vitals:   06/27/15 1010  BP: 132/82  Pulse: 76  NAD RRR Lungs are clear to auscultation Abdomen is soft, nontender, left CVA tenderness mild Extremities are symmetric  I've independently reviewed the patient's CT scan from 06/24/14 which demonstrates a small collection of stones in the lower pole of the left kidney. Stent is in adequate position  Impression: The patient has residual  left-sided stone fragments. He does not appear to have ongoing pyelonephritis or infection although he is currently being treated with Augmentin and is completing a 2 week course. The patient is quite frustrated with his situation given that he has had a stent in for now 6 weeks. Further, he has new/residual stones from the initial procedure. These are nonobstructing.  Discussion: The patient and I went over his treatment options. We discussed removing the stent and letting him recover and then considering treating the nonobstructing stone in the future. This is somewhat riskier given that he only has one kidney. The patient was not willing to take this risk. Instead, the patient has opted to proceed with a second stage left ureteroscopy and stent exchange. We will try to get this scheduled as soon as possible. I did give the patient extra Augmentin to cover him so that he does not get infected between now and the time of his procedure. We then discussed stent management options. I suggested that we consider leaving a string on his stent and having him remove the stent following the operation. We would then follow up with him in 6 weeks with an ultrasound prior. However, I did tell him that stent management would be up to the physician performing his procedure.

## 2015-06-27 NOTE — Telephone Encounter (Signed)
Caller states that Dr. Pilar Jarvis did lithotripsy and placed stent.  Patient has a single kidney.  He has been admitted into the hospital X2 with a kidney infection, close to sepsis.  He was just discharged Tuesday, urination has been clear.  He is now urinating pure blood.  Stent has not been removed.    Patient went to the ER and told that there is now an additional stone that is not able to be passed.  Patient has been scheduled to come into the office 06/27/15 to be evaluated.

## 2015-06-28 ENCOUNTER — Encounter
Admission: RE | Disposition: A | Discharge status: Home / Self Care | Payer: Self-pay | Source: Ambulatory Visit | Attending: Urology

## 2015-06-28 ENCOUNTER — Ambulatory Visit: Payer: 59 | Admitting: Certified Registered Nurse Anesthetist

## 2015-06-28 ENCOUNTER — Encounter: Payer: Self-pay | Admitting: *Deleted

## 2015-06-28 ENCOUNTER — Ambulatory Visit
Admission: RE | Admit: 2015-06-28 | Discharge: 2015-06-28 | Disposition: A | Discharge status: Home / Self Care | Payer: 59 | Source: Ambulatory Visit | Attending: Urology | Admitting: Urology

## 2015-06-28 DIAGNOSIS — I10 Essential (primary) hypertension: Secondary | ICD-10-CM | POA: Insufficient documentation

## 2015-06-28 DIAGNOSIS — Z79899 Other long term (current) drug therapy: Secondary | ICD-10-CM | POA: Insufficient documentation

## 2015-06-28 DIAGNOSIS — K760 Fatty (change of) liver, not elsewhere classified: Secondary | ICD-10-CM | POA: Diagnosis not present

## 2015-06-28 DIAGNOSIS — E119 Type 2 diabetes mellitus without complications: Secondary | ICD-10-CM | POA: Insufficient documentation

## 2015-06-28 DIAGNOSIS — K7460 Unspecified cirrhosis of liver: Secondary | ICD-10-CM | POA: Diagnosis not present

## 2015-06-28 DIAGNOSIS — M1990 Unspecified osteoarthritis, unspecified site: Secondary | ICD-10-CM | POA: Insufficient documentation

## 2015-06-28 DIAGNOSIS — M5414 Radiculopathy, thoracic region: Secondary | ICD-10-CM | POA: Insufficient documentation

## 2015-06-28 DIAGNOSIS — M5417 Radiculopathy, lumbosacral region: Secondary | ICD-10-CM | POA: Diagnosis not present

## 2015-06-28 DIAGNOSIS — N200 Calculus of kidney: Secondary | ICD-10-CM | POA: Diagnosis not present

## 2015-06-28 DIAGNOSIS — K219 Gastro-esophageal reflux disease without esophagitis: Secondary | ICD-10-CM | POA: Insufficient documentation

## 2015-06-28 DIAGNOSIS — M5416 Radiculopathy, lumbar region: Secondary | ICD-10-CM | POA: Insufficient documentation

## 2015-06-28 DIAGNOSIS — Z905 Acquired absence of kidney: Secondary | ICD-10-CM | POA: Insufficient documentation

## 2015-06-28 DIAGNOSIS — Z85528 Personal history of other malignant neoplasm of kidney: Secondary | ICD-10-CM | POA: Insufficient documentation

## 2015-06-28 DIAGNOSIS — F419 Anxiety disorder, unspecified: Secondary | ICD-10-CM | POA: Insufficient documentation

## 2015-06-28 DIAGNOSIS — N289 Disorder of kidney and ureter, unspecified: Secondary | ICD-10-CM | POA: Diagnosis not present

## 2015-06-28 HISTORY — PX: URETEROSCOPY WITH HOLMIUM LASER LITHOTRIPSY: SHX6645

## 2015-06-28 HISTORY — PX: CYSTOSCOPY WITH STENT PLACEMENT: SHX5790

## 2015-06-28 LAB — URINALYSIS, COMPLETE
Bilirubin, UA: NEGATIVE
Ketones, UA: NEGATIVE
Leukocytes, UA: NEGATIVE
Nitrite, UA: NEGATIVE
Specific Gravity, UA: 1.015 (ref 1.005–1.030)
Urobilinogen, Ur: 0.2 mg/dL (ref 0.2–1.0)
pH, UA: 5 (ref 5.0–7.5)

## 2015-06-28 LAB — MICROSCOPIC EXAMINATION
Bacteria, UA: NONE SEEN
RBC, UA: >30 /hpf — AB (ref 0–?)
WBC, UA: NONE SEEN /hpf (ref 0–?)

## 2015-06-28 LAB — GLUCOSE, CAPILLARY
Glucose-Capillary: 140 mg/dL — ABNORMAL HIGH (ref 65–99)
Glucose-Capillary: 150 mg/dL — ABNORMAL HIGH (ref 65–99)

## 2015-06-28 SURGERY — URETEROSCOPY, WITH LITHOTRIPSY USING HOLMIUM LASER
Anesthesia: General | Laterality: Left

## 2015-06-28 MED ORDER — ACETAMINOPHEN 10 MG/ML IV SOLN
INTRAVENOUS | Status: DC | PRN
  Administered 2015-06-28: 1000 mg via INTRAVENOUS

## 2015-06-28 MED ORDER — HYDROCODONE-ACETAMINOPHEN 5-325 MG PO TABS
ORAL_TABLET | ORAL | Status: AC
  Administered 2015-06-28: 2
  Filled 2015-06-28: qty 2

## 2015-06-28 MED ORDER — NEOSTIGMINE METHYLSULFATE 10 MG/10ML IV SOLN
INTRAVENOUS | Status: DC | PRN
  Administered 2015-06-28: 3 mg via INTRAVENOUS

## 2015-06-28 MED ORDER — OXYBUTYNIN CHLORIDE 5 MG PO TABS: 5.0000 mg | ORAL_TABLET | Freq: Three times a day (TID) | ORAL | Status: DC | PRN

## 2015-06-28 MED ORDER — GLYCOPYRROLATE 0.2 MG/ML IJ SOLN
INTRAMUSCULAR | Status: DC | PRN
  Administered 2015-06-28: 0.4 mg via INTRAVENOUS
  Administered 2015-06-28: 0.2 mg via INTRAVENOUS

## 2015-06-28 MED ORDER — FENTANYL CITRATE (PF) 100 MCG/2ML IJ SOLN
25.0000 ug | INTRAMUSCULAR | Status: AC | PRN
  Administered 2015-06-28 (×6): 25 ug via INTRAVENOUS

## 2015-06-28 MED ORDER — MIDAZOLAM HCL 2 MG/2ML IJ SOLN
INTRAMUSCULAR | Status: DC | PRN
  Administered 2015-06-28: 1.5 mg via INTRAVENOUS

## 2015-06-28 MED ORDER — SUCCINYLCHOLINE CHLORIDE 20 MG/ML IJ SOLN
INTRAMUSCULAR | Status: DC | PRN
  Administered 2015-06-28: 100 mg via INTRAVENOUS

## 2015-06-28 MED ORDER — LACTATED RINGERS IV SOLN: INTRAVENOUS | Status: DC

## 2015-06-28 MED ORDER — IOTHALAMATE MEGLUMINE 43 % IV SOLN
INTRAVENOUS | Status: DC | PRN
  Administered 2015-06-28: 20 mL

## 2015-06-28 MED ORDER — ROCURONIUM BROMIDE 100 MG/10ML IV SOLN
INTRAVENOUS | Status: DC | PRN
  Administered 2015-06-28: 40 mg via INTRAVENOUS
  Administered 2015-06-28: 10 mg via INTRAVENOUS

## 2015-06-28 MED ORDER — SODIUM CHLORIDE 0.9 % IV SOLN
1.5000 g | Freq: Once | INTRAVENOUS | Status: AC
  Administered 2015-06-28: 1.5 g via INTRAVENOUS
  Filled 2015-06-28: qty 1.5

## 2015-06-28 MED ORDER — PROPOFOL 10 MG/ML IV BOLUS
INTRAVENOUS | Status: DC | PRN
  Administered 2015-06-28: 180 mg via INTRAVENOUS

## 2015-06-28 MED ORDER — ONDANSETRON HCL 4 MG/2ML IJ SOLN: 4.0000 mg | Freq: Once | INTRAMUSCULAR | Status: DC | PRN

## 2015-06-28 MED ORDER — SODIUM CHLORIDE 0.9 % IV SOLN
INTRAVENOUS | Status: DC
  Administered 2015-06-28 (×2): via INTRAVENOUS

## 2015-06-28 MED ORDER — ONDANSETRON HCL 4 MG/2ML IJ SOLN
INTRAMUSCULAR | Status: DC | PRN
  Administered 2015-06-28: 4 mg via INTRAVENOUS

## 2015-06-28 MED ORDER — FENTANYL CITRATE (PF) 100 MCG/2ML IJ SOLN
INTRAMUSCULAR | Status: DC | PRN
  Administered 2015-06-28: 200 ug via INTRAVENOUS
  Administered 2015-06-28: 50 ug via INTRAVENOUS

## 2015-06-28 MED ORDER — LIDOCAINE HCL (CARDIAC) 20 MG/ML IV SOLN
INTRAVENOUS | Status: DC | PRN
  Administered 2015-06-28: 100 mg via INTRAVENOUS

## 2015-06-28 MED ORDER — ACETAMINOPHEN 10 MG/ML IV SOLN
INTRAVENOUS | Status: AC
  Filled 2015-06-28: qty 100

## 2015-06-28 MED ORDER — FENTANYL CITRATE (PF) 100 MCG/2ML IJ SOLN
INTRAMUSCULAR | Status: AC
  Administered 2015-06-28: 25 ug via INTRAVENOUS
  Filled 2015-06-28: qty 2

## 2015-06-28 MED ORDER — HYDROCODONE-ACETAMINOPHEN 5-325 MG PO TABS: 1.0000 | ORAL_TABLET | Freq: Four times a day (QID) | ORAL | Status: DC | PRN

## 2015-06-28 SURGICAL SUPPLY — 31 items
ADAPTER SCOPE UROLOK II (MISCELLANEOUS) IMPLANT
BAG DRAIN CYSTO-URO LG1000N (MISCELLANEOUS) ×3 IMPLANT
BASKET ZERO TIP 1.9FR (BASKET) ×3 IMPLANT
CATH URETL 5X70 OPEN END (CATHETERS) ×3 IMPLANT
CNTNR SPEC 2.5X3XGRAD LEK (MISCELLANEOUS) ×1
CONRAY 43 FOR UROLOGY 50M (MISCELLANEOUS) ×3 IMPLANT
CONT SPEC 4OZ STER OR WHT (MISCELLANEOUS) ×2
CONTAINER SPEC 2.5X3XGRAD LEK (MISCELLANEOUS) ×1 IMPLANT
GLOVE BIO SURGEON STRL SZ 6.5 (GLOVE) ×2 IMPLANT
GLOVE BIO SURGEON STRL SZ7 (GLOVE) ×6 IMPLANT
GLOVE BIO SURGEONS STRL SZ 6.5 (GLOVE) ×1
GOWN STRL REUS W/ TWL LRG LVL4 (GOWN DISPOSABLE) ×2 IMPLANT
GOWN STRL REUS W/TWL LRG LVL4 (GOWN DISPOSABLE) ×4
GUIDEWIRE SUPER STIFF (WIRE) ×3 IMPLANT
INTRODUCER DILATOR DOUBLE (INTRODUCER) ×3 IMPLANT
KIT RM TURNOVER CYSTO AR (KITS) ×3 IMPLANT
LASER FIBER 200M SMARTSCOPE (Laser) ×6 IMPLANT
PACK CYSTO AR (MISCELLANEOUS) ×3 IMPLANT
PREP PVP WINGED SPONGE (MISCELLANEOUS) ×3 IMPLANT
PUMP SINGLE ACTION SAP (PUMP) IMPLANT
SENSORWIRE 0.038 NOT ANGLED (WIRE) ×6
SET CYSTO W/LG BORE CLAMP LF (SET/KITS/TRAYS/PACK) ×3 IMPLANT
SHEATH URETERAL 12FRX35CM (MISCELLANEOUS) ×3 IMPLANT
SOL .9 NS 3000ML IRR  AL (IV SOLUTION) ×2
SOL .9 NS 3000ML IRR UROMATIC (IV SOLUTION) ×1 IMPLANT
STENT URET 6FRX24 CONTOUR (STENTS) IMPLANT
STENT URET 6FRX26 CONTOUR (STENTS) ×3 IMPLANT
SURGILUBE 2OZ TUBE FLIPTOP (MISCELLANEOUS) ×3 IMPLANT
SYRINGE IRR TOOMEY STRL 70CC (SYRINGE) ×3 IMPLANT
WATER STERILE IRR 1000ML POUR (IV SOLUTION) ×3 IMPLANT
WIRE SENSOR 0.038 NOT ANGLED (WIRE) ×2 IMPLANT

## 2015-06-28 NOTE — Op Note (Signed)
Date of procedure: 06/28/2015  Preoperative diagnosis:  1. Left solitary kidney 2. Left nephrolithiasis   Postoperative diagnosis:  1. Same as above   Procedure: 1. Left ureteroscopy 2. Laser lithotripsy 3. Left ureteral stent placement 4. Basket extraction of the stone fragment 5. Left retrograde pyelogram 6. Left ureteral stent exchange  Surgeon: Hollice Espy, MD  Anesthesia: General  Complications: None  Intraoperative findings: 1 cm lower pole burden along with 3 mm midpole stone obliterated and extracted.  EBL: Minimal  Specimens: None  Drains: 6 x 26 French double-J ureteral stent on the left (string left in place)  Indication: Anthony Fuller is a 55 y.o. patient with left solitary kidney and nonobstructing left kidney stones. He previously underwent left ureteroscopy laser lithotripsy but with residual stone burden. His postoperative course was previously complicated by pyelonephritis..  After reviewing the management options for treatment, he elected to proceed with the above surgical procedure(s). We have discussed the potential benefits and risks of the procedure, side effects of the proposed treatment, the likelihood of the patient achieving the goals of the procedure, and any potential problems that might occur during the procedure or recuperation. Informed consent has been obtained.  Description of procedure:  The patient was taken to the operating room and general anesthesia was induced.  The patient was placed in the dorsal lithotomy position, prepped and draped in the usual sterile fashion, and preoperative antibiotics were administered. A preoperative time-out was performed.   A 21 rigid cystoscope was advanced per urethra into the bladder. Attention was turned to the left ureteral orifice from which a left ureteral stent was seen emanating. Stent graspers were then used to grasp the distal coil of the stent and bring out the level of the urethral meatus. The  stent was cannulated using a sensor wire up to level of the kidney. The stent was removed leaving the wire place. A dual lumen introducer was used just within the distal ureter to introduce a second Super Stiff wire up to level of the kidney under fluoroscopic guidance. The sensor wire was snapped in place and a Cook ureteral access sheath was advanced very easily over the Super Stiff wire up to level of the proximal ureter. The inner cannula and Super Stiff wire removed. At this point time, a flexible ureteroscope was advanced through the access sheath up to level of the kidney without difficulty. A formal pyeloscopy was performed directly visualizing each never calyx. The proximal leg 1 cm stone burden was identified and a lower pole calyx. A 200  laser fiber was then brought in and using settings of 0.8 J and 15 Hz, the stone was fragmented into numerous small particles. A 1.9 French to plus nitinol basket was then used to extract each and every one of these fragments until the calyx was completely cleared of all stone burden. The midpole calyx was also identified and extracted in one piece. Once the kidney was adequately cleared of all stone, the scope was backed to level of the UPJ and retrograde pyelogram was performed in order to outline all calyces to create a roadmap. This revealed no filling defects, no hydronephrosis, and taken from the each and every calyx was visualized. The sheath was then scoped out of the ureter directly visualizing the ureter along the way. The ureter was widely patent and there was no evidence of ureteral injury or edema. Given the overall excellent appearance of the ureter, I did make the decision to leave the stent on  string. The safety wire was backloaded over a rigid cystoscope and a 6 x 24 French double-J ureteral stent was advanced over the wire up to level of the kidney. The wire was partially withdrawn until the coil hooked over the lower pole system. The wire was then  fully withdrawn and a full course noted within the bladder. The bladder was then drained. The patient was cleaned and dried. The string of the stent was secured to the patient's glans using Mastisol and Tegaderm. He was then repositioned the supine position, reversed from anesthesia, and taken the PACU in stable condition. Next  Plan patient will follow-up in 4-6 weeks with a renal ultrasound prior. He will remove his own stent on Sunday as he has to return back to work on Monday.  Hollice Espy, M.D.

## 2015-06-28 NOTE — H&P (View-Only) (Signed)
55 year old male who presents today in follow-up. The patient is status post left ureteroscopy in early January for left-sided nonobstructing nephrolithiasis. Following the procedure the patient developed pyelonephritis and as such, the stent was not removed as scheduled one week following the operation. He has since been hospitalized twice for enterococcus UTIs. At his last admission he was also having unassociated chest pain and underwent a cardiac catheterization which was noted to Be completely normal. The patient was subsequently discharged and scheduled for follow-up in urology. In the meantime however, the patient developed gross hematuria and left testicular pain. He was then re-seen at the emergency room where a CT scan was performed which demonstrated a small stone in the left lower pole as well as a additional 3 mm calcification in the interpolar region. Currently, the patient is complaining of fatigue, intermittent diaphoresis, and general malaise. His hematuria seemed to resolve. He denies any dysuria. He has persistent left scrotal pain, ultrasound performed in the emergency department demonstrated a left varicocele. He denies any fevers.  The patient's past medical area significant for right radical nephrectomy for RCC.  Current Outpatient Prescriptions on File Prior to Visit  Medication Sig Dispense Refill  . dicyclomine (BENTYL) 10 MG capsule Take 10 mg by mouth 3 (three) times daily as needed for spasms.     . empagliflozin (JARDIANCE) 10 MG TABS tablet Take 10 mg by mouth every morning.    Marland Kitchen omeprazole (PRILOSEC) 40 MG capsule Take 40 mg by mouth at bedtime.     . pregabalin (LYRICA) 150 MG capsule Take 1 capsule (150 mg total) by mouth 2 (two) times daily. 60 capsule 0  . tamsulosin (FLOMAX) 0.4 MG CAPS capsule Take 0.4 mg by mouth at bedtime.     . traMADol (ULTRAM) 50 MG tablet Take 50 mg by mouth every 6 (six) hours as needed for moderate pain. for pain  1  . citalopram (CELEXA)  20 MG tablet Take 20 mg by mouth at bedtime. Reported on 06/27/2015    . HYDROcodone-acetaminophen (NORCO/VICODIN) 5-325 MG tablet Take 1 tablet by mouth every 6 (six) hours as needed for moderate pain. (Patient not taking: Reported on 06/27/2015) 40 tablet 0  . loratadine (CLARITIN) 10 MG tablet Take 10 mg by mouth daily as needed for allergies. Reported on 06/27/2015    . predniSONE (DELTASONE) 5 MG tablet 3 tabs day1; 2 tabs day2; 1 tab day3,4 then stop (Patient not taking: Reported on 06/27/2015) 7 tablet 0  . senna (SENOKOT) 8.6 MG TABS tablet Take 1 tablet (8.6 mg total) by mouth daily. (Patient not taking: Reported on 06/27/2015) 120 each 0   No current facility-administered medications on file prior to visit.   Past Medical History  Diagnosis Date  . Arthritis   . GERD (gastroesophageal reflux disease)   . Hypertension   . Diabetes mellitus without complication ([place])   . Cancer ([place]) 2012    R kidney removed  . 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  Marland Kitchen Anxiety   . Clostridium difficile infection 02/04/2014  . Fatty liver disease, nonalcoholic 123456  . Hepatic cirrhosis ([place]) 09/12/2014  . Diabetes mellitus ([place]) 07/24/2011  . DM (diabetes mellitus) type 2, uncontrolled, with ketoacidosis ([place]) 05/30/2015  . Lumbar radiculopathy 09/05/2011  . Thoracic and lumbosacral neuritis 07/24/2011  . 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 Degeneration of intervertebral disc of lumbar region 08/23/2011  . Enthesopathy of hip 04/11/2015  . Acid indigestion 02/14/2012  PE: Filed Vitals:   06/27/15 1010  BP: 132/82  Pulse: 76  NAD RRR Lungs are clear to auscultation Abdomen is soft, nontender, left CVA tenderness mild Extremities are symmetric  I've independently reviewed the patient's CT scan from 06/24/14 which demonstrates a small collection of stones in the lower pole of the left kidney. Stent is in adequate position  Impression: The patient has residual  left-sided stone fragments. He does not appear to have ongoing pyelonephritis or infection although he is currently being treated with Augmentin and is completing a 2 week course. The patient is quite frustrated with his situation given that he has had a stent in for now 6 weeks. Further, he has new/residual stones from the initial procedure. These are nonobstructing.  Discussion: The patient and I went over his treatment options. We discussed removing the stent and letting him recover and then considering treating the nonobstructing stone in the future. This is somewhat riskier given that he only has one kidney. The patient was not willing to take this risk. Instead, the patient has opted to proceed with a second stage left ureteroscopy and stent exchange. We will try to get this scheduled as soon as possible. I did give the patient extra Augmentin to cover him so that he does not get infected between now and the time of his procedure. We then discussed stent management options. I suggested that we consider leaving a string on his stent and having him remove the stent following the operation. We would then follow up with him in 6 weeks with an ultrasound prior. However, I did tell him that stent management would be up to the physician performing his procedure.

## 2015-06-28 NOTE — Anesthesia Preprocedure Evaluation (Signed)
Anesthesia Evaluation  Patient identified by MRN, date of birth, ID band Patient awake    Reviewed: Allergy & Precautions, H&P , NPO status , Patient's Chart, lab work & pertinent test results, reviewed documented beta blocker date and time   History of Anesthesia Complications (+) DIFFICULT AIRWAY  Airway Mallampati: III  TM Distance: >3 FB Neck ROM: full    Dental  (+) Teeth Intact, Poor Dentition   Pulmonary neg pulmonary ROS,    Pulmonary exam normal        Cardiovascular hypertension, negative cardio ROS Normal cardiovascular exam Rhythm:regular Rate:Normal     Neuro/Psych  Neuromuscular disease negative neurological ROS  negative psych ROS   GI/Hepatic negative GI ROS, Neg liver ROS, GERD  ,  Endo/Other  negative endocrine ROSdiabetes  Renal/GU Renal diseasenegative Renal ROS  negative genitourinary   Musculoskeletal   Abdominal   Peds  Hematology negative hematology ROS (+)   Anesthesia Other Findings Past Medical History:   Arthritis                                                    GERD (gastroesophageal reflux disease)                       Hypertension                                                 Diabetes mellitus without complication ([place])                 Cancer ([place])                                    2012           Comment:R kidney removed   Renal insufficiency                                            Comment:Right Nephrectomy due to [place].   Anxiety                                                      Clostridium difficile infection                 02/04/2014    Fatty liver disease, nonalcoholic               [id number]     Hepatic cirrhosis ([place])                         09/12/2014     Diabetes mellitus ([place])                         07/24/2011    DM (diabetes mellitus) type 2, uncontrolled, w* 05/30/2015    Lumbar radiculopathy  09/05/2011    Thoracic and lumbosacral  neuritis               07/24/2011    Bulge of lumbar disc without myelopathy         09/05/2011    Degeneration of intervertebral disc of lumbar * 08/23/2011    Enthesopathy of hip                             04/11/2015   Acid indigestion                                02/14/2012  Past Surgical History:   SPINE SURGERY                                                   Comment:lumbar 2013   kidney removed                                  Right                Comment:2012   BACK SURGERY                                                  NEPHRECTOMY                                     Right 04/2011      URETEROSCOPY WITH HOLMIUM LASER LITHOTRIPSY     Left 05/18/2015       Comment:Procedure: URETEROSCOPY WITH HOLMIUM LASER               LITHOTRIPSY;  Surgeon: Nickie Retort, MD;               Location: [facility] ORS;  Service: Urology;                Laterality: Left;   CYSTOSCOPY W/ RETROGRADES                       Left 05/18/2015       Comment:Procedure: CYSTOSCOPY WITH RETROGRADE               PYELOGRAM;  Surgeon: Nickie Retort, MD;                Location: [facility] OR[facility];  Service: Urology;                Laterality: Left;   CYSTOSCOPY WITH STENT PLACEMENT                 Left 05/18/2015       Comment:Procedure: CYSTOSCOPY WITH STENT PLACEMENT;                Surgeon: Nickie Retort, MD;  Location:               [facility] ORS;  Service: Urology;  Laterality: Left;   CARDIAC CATHETERIZATION  Right 06/20/2015       Comment:Procedure: Left Heart Cath and Coronary               Angiography;  Surgeon: Dionisio David, MD;                Location: [facility];  Service:               Cardiovascular;  Laterality: Right; BMI    Body Mass Index   33.36 kg/m 2     Reproductive/Obstetrics negative OB ROS                             Anesthesia Physical Anesthesia Plan  ASA: III  Anesthesia Plan: General ETT   Post-op Pain Management:     Induction: Rapid sequence, Intravenous and Cricoid pressure planned  Airway Management Planned: Video Laryngoscope Planned  Additional Equipment:   Intra-op Plan:   Post-operative Plan:   Informed Consent: I have reviewed the patients History and Physical, chart, labs and discussed the procedure including the risks, benefits and alternatives for the proposed anesthesia with the patient or authorized representative who has indicated his/her understanding and acceptance.   Dental Advisory Given  Plan Discussed with: CRNA  Anesthesia Plan Comments:         Anesthesia Quick Evaluation

## 2015-06-28 NOTE — Discharge Instructions (Signed)
You have a ureteral stent in place.  This is a tube that extends from your kidney to your bladder.  This may cause urinary bleeding, burning with urination, and urinary frequency.  Please call our office or present to the ED if you develop fevers >101 or pain which is not able to be controlled with oral pain medications.  You may be given either Flomax and/ or ditropan to help with bladder spasms and stent pain in addition to pain medications.    Your stent in on a string taped to the head of your penis.  Please wait until Sunday AM to remove the stent.  At this time, untape and pull gently until the entire stent is removed.    [facility] [address] [place], [place] [address] [phone]   AMBULATORY SURGERY  DISCHARGE INSTRUCTIONS   1) The drugs that you were given will stay in your system until tomorrow so for the next 24 hours you should not:  A) Drive an automobile B) Make any legal decisions C) Drink any alcoholic beverage   2) You may resume regular meals tomorrow.  Today it is better to start with liquids and gradually work up to solid foods.  You may eat anything you prefer, but it is better to start with liquids, then soup and crackers, and gradually work up to solid foods.   3) Please notify your doctor immediately if you have any unusual bleeding, trouble breathing, redness and pain at the surgery site, drainage, fever, or pain not relieved by medication.    4) Additional Instructions:        Please contact your physician with any problems or Same Day Surgery at [phone], Monday through Friday 6 am to 4 pm, or [place] at [facility] number at [phone].

## 2015-06-28 NOTE — Transfer of Care (Signed)
Immediate Anesthesia Transfer of Care Note  Patient: Anthony Fuller  Procedure(s) Performed: Procedure(s): URETEROSCOPY WITH HOLMIUM LASER LITHOTRIPSY (Left) CYSTOSCOPY WITH STENT PLACEMENT/ EXCHANGE (Left)  Patient Location: PACU  Anesthesia Type:General  Level of Consciousness: sedated  Airway & Oxygen Therapy: Patient Spontanous Breathing and Patient connected to nasal cannula oxygen  Post-op Assessment: Report given to RN and Post -op Vital signs reviewed and stable  Post vital signs: Reviewed and stable  Last Vitals:  Filed Vitals:   06/28/15 0928 06/28/15 1214  BP: 131/85 153/91  Pulse: 67 89  Temp: 36.7 C 37.2 C  Resp: 16 12    Complications: No apparent anesthesia complications

## 2015-06-28 NOTE — Interval H&P Note (Signed)
History and Physical Interval Note:  06/28/2015 10:07 AM  Anthony Fuller  has presented today for surgery, with the diagnosis of nephrolithiasis  The various methods of treatment have been discussed with the patient and family. After consideration of risks, benefits and other options for treatment, the patient has consented to  Procedure(s): URETEROSCOPY WITH HOLMIUM LASER LITHOTRIPSY (Left) CYSTOSCOPY WITH STENT PLACEMENT/ EXCHANGE (Left) as a surgical intervention .  The patient's history has been reviewed, patient examined, no change in status, stable for surgery.  I have reviewed the patient's chart and labs.  Questions were answered to the patient's satisfaction.     Hollice Espy

## 2015-06-28 NOTE — Progress Notes (Signed)
Confirmed with Dr. Erlene Quan preop - wants Unasyn 1.5mg  IV on call to OR, and not Unasyn 1 mg

## 2015-06-28 NOTE — Anesthesia Procedure Notes (Signed)
Procedure Name: Intubation Date/Time: 06/28/2015 10:42 AM Performed by: Rosaria Ferries, Bunny Lowdermilk Pre-anesthesia Checklist: Patient identified, Emergency Drugs available, Suction available and Patient being monitored Patient Re-evaluated:Patient Re-evaluated prior to inductionOxygen Delivery Method: Circle system utilized Preoxygenation: Pre-oxygenation with 100% oxygen (shoulder boost) Intubation Type: IV induction Laryngoscope Size: Mac and 3 Grade View: Grade III Tube type: Oral Tube size: 7.0 mm Number of attempts: 1 Airway Equipment and Method: Bougie stylet Placement Confirmation: positive ETCO2 and breath sounds checked- equal and bilateral (tracheal rings felt w/ bougie insertion) Secured at: 23 cm Tube secured with: Tape Dental Injury: Teeth and Oropharynx as per pre-operative assessment  Difficulty Due To: Difficulty was anticipated Future Recommendations: Recommend- induction with short-acting agent, and alternative techniques readily available

## 2015-06-29 ENCOUNTER — Encounter: Payer: Self-pay | Admitting: Urology

## 2015-06-29 LAB — CULTURE, URINE COMPREHENSIVE

## 2015-06-30 NOTE — Anesthesia Postprocedure Evaluation (Signed)
Anesthesia Post Note  Patient: Anthony Fuller  Procedure(s) Performed: Procedure(s) (LRB): URETEROSCOPY WITH HOLMIUM LASER LITHOTRIPSY (Left) CYSTOSCOPY WITH STENT PLACEMENT/ EXCHANGE (Left)  Patient location during evaluation: PACU Anesthesia Type: General Level of consciousness: awake and alert Pain management: pain level controlled Vital Signs Assessment: post-procedure vital signs reviewed and stable Respiratory status: spontaneous breathing, nonlabored ventilation, respiratory function stable and patient connected to nasal cannula oxygen Cardiovascular status: blood pressure returned to baseline and stable Postop Assessment: no signs of nausea or vomiting Anesthetic complications: no    Last Vitals:  Filed Vitals:   06/28/15 1347 06/28/15 1403  BP: 125/64 132/65  Pulse: 78 84  Temp:    Resp: 16 16    Last Pain:  Filed Vitals:   06/29/15 0828  PainSc: 3                  Molli Barrows

## 2015-07-01 ENCOUNTER — Other Ambulatory Visit: Payer: 59

## 2015-07-12 DIAGNOSIS — E669 Obesity, unspecified: Secondary | ICD-10-CM | POA: Insufficient documentation

## 2015-07-12 DIAGNOSIS — D696 Thrombocytopenia, unspecified: Secondary | ICD-10-CM | POA: Insufficient documentation

## 2015-07-12 DIAGNOSIS — I10 Essential (primary) hypertension: Secondary | ICD-10-CM | POA: Insufficient documentation

## 2015-07-12 DIAGNOSIS — N433 Hydrocele, unspecified: Secondary | ICD-10-CM | POA: Insufficient documentation

## 2015-07-14 DIAGNOSIS — Z981 Arthrodesis status: Secondary | ICD-10-CM | POA: Insufficient documentation

## 2015-08-02 ENCOUNTER — Ambulatory Visit
Admission: RE | Admit: 2015-08-02 | Discharge: 2015-08-02 | Disposition: A | Discharge status: Home / Self Care | Payer: 59 | Source: Ambulatory Visit | Attending: Urology | Admitting: Urology

## 2015-08-02 DIAGNOSIS — Z87442 Personal history of urinary calculi: Secondary | ICD-10-CM | POA: Diagnosis present

## 2015-08-02 DIAGNOSIS — N200 Calculus of kidney: Secondary | ICD-10-CM

## 2015-08-02 DIAGNOSIS — Z905 Acquired absence of kidney: Secondary | ICD-10-CM | POA: Insufficient documentation

## 2015-08-03 ENCOUNTER — Telehealth: Payer: Self-pay

## 2015-08-03 NOTE — Telephone Encounter (Signed)
-----   Message from Hollice Espy, MD sent at 08/03/2015 12:47 PM EDT ----- Please let this patient know this ultrasound was normal. We will see him in follow-up as scheduled.

## 2015-08-03 NOTE — Telephone Encounter (Signed)
LMOM

## 2015-08-04 NOTE — Telephone Encounter (Signed)
Spoke with pt wife in reference to ultrasound results. Wife voiced understanding.

## 2015-08-09 ENCOUNTER — Ambulatory Visit: Payer: 59 | Admitting: Urology

## 2015-08-12 ENCOUNTER — Ambulatory Visit: Payer: 59 | Admitting: Urology

## 2015-08-12 ENCOUNTER — Encounter: Payer: Self-pay | Admitting: Urology

## 2015-12-11 ENCOUNTER — Ambulatory Visit (INDEPENDENT_AMBULATORY_CARE_PROVIDER_SITE_OTHER): Payer: 59

## 2015-12-11 ENCOUNTER — Encounter: Payer: Self-pay | Admitting: Gynecology

## 2015-12-11 ENCOUNTER — Ambulatory Visit
Admission: EM | Admit: 2015-12-11 | Discharge: 2015-12-11 | Disposition: A | Discharge status: Home / Self Care | Payer: 59 | Attending: Emergency Medicine | Admitting: Emergency Medicine

## 2015-12-11 DIAGNOSIS — M1712 Unilateral primary osteoarthritis, left knee: Secondary | ICD-10-CM | POA: Diagnosis not present

## 2015-12-11 MED ORDER — NAPROXEN 500 MG PO TABS: 500.0000 mg | ORAL_TABLET | Freq: Two times a day (BID) | ORAL | 0 refills | Status: DC

## 2015-12-11 NOTE — ED Triage Notes (Signed)
Patient c/o left knee pain x over 1 week.Per patient no injury to knee.

## 2015-12-11 NOTE — ED Provider Notes (Signed)
CSN: [id number]     Arrival date & time 12/11/15  0822 History   First MD Initiated Contact with Patient 12/11/15 0900     Chief Complaint  Patient presents with  . Knee Pain   (Consider location/radiation/quality/duration/timing/severity/associated sxs/prior Treatment) HPI   This 55 year old male who presents with a one to two-week history of knee pain where he indicates the medial distal femur just above the joint line. He states if he walks straight leg does not bother him but any motion of taking his body against gravity or even range of motion seems to make it worse. He has no history of injury or increase in any physical activity the usual for him. He is not noticed any swelling but has had a pop catching sensation. He said no fever or chills.    Past Medical History:  Diagnosis Date  . Acid indigestion 02/14/2012  . Anxiety   . Arthritis   . 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 Cancer ([place]) 2012   R kidney removed  . Clostridium difficile infection 02/04/2014  . Degeneration of intervertebral disc of lumbar region 08/23/2011  . Diabetes mellitus ([place]) 07/24/2011  . Diabetes mellitus without complication ([place])   . DM (diabetes mellitus) type 2, uncontrolled, with ketoacidosis ([place]) 05/30/2015  . Enthesopathy of hip 04/11/2015  . Fatty liver disease, nonalcoholic 123456  . GERD (gastroesophageal reflux disease)   . Hepatic cirrhosis ([place]) 09/12/2014  . Hypertension   . Lumbar radiculopathy 09/05/2011  . 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  Marland Kitchen Thoracic and lumbosacral neuritis 07/24/2011   Past Surgical History:  Procedure Laterality Date  . BACK SURGERY    . CARDIAC CATHETERIZATION Right 06/20/2015   Procedure: Left Heart Cath and Coronary Angiography;  Surgeon: Dionisio David, MD;  Location: [facility];  Service: Cardiovascular;  Laterality: Right;  . CYSTOSCOPY W/ RETROGRADES Left 05/18/2015   Procedure: CYSTOSCOPY WITH RETROGRADE  PYELOGRAM;  Surgeon: Nickie Retort, MD;  Location: [facility];  Service: Urology;  Laterality: Left;  . CYSTOSCOPY WITH STENT PLACEMENT Left 05/18/2015   Procedure: CYSTOSCOPY WITH STENT PLACEMENT;  Surgeon: Nickie Retort, MD;  Location: [facility];  Service: Urology;  Laterality: Left;  . CYSTOSCOPY WITH STENT PLACEMENT Left 06/28/2015   Procedure: CYSTOSCOPY WITH STENT PLACEMENT/ EXCHANGE;  Surgeon: Hollice Espy, MD;  Location: [facility];  Service: Urology;  Laterality: Left;  . kidney removed Right    2012  . NEPHRECTOMY Right 04/2011  . SPINE SURGERY     lumbar 2013  . URETEROSCOPY WITH HOLMIUM LASER LITHOTRIPSY Left 05/18/2015   Procedure: URETEROSCOPY WITH HOLMIUM LASER LITHOTRIPSY;  Surgeon: Nickie Retort, MD;  Location: [facility];  Service: Urology;  Laterality: Left;  . URETEROSCOPY WITH HOLMIUM LASER LITHOTRIPSY Left 06/28/2015   Procedure: URETEROSCOPY WITH HOLMIUM LASER LITHOTRIPSY;  Surgeon: Hollice Espy, MD;  Location: [facility];  Service: Urology;  Laterality: Left;   Family History  Problem Relation Age of Onset  . Diabetes Mother   . Hypertension Mother   . Asthma Mother   . Heart disease Father   . Kidney disease Father   . Diabetes Father   . Stroke Father    Social History  Substance Use Topics  . Smoking status: Never Smoker  . Smokeless tobacco: Never Used  . Alcohol use No    Review of Systems  Constitutional: Negative for activity change, chills, fatigue and fever.  Musculoskeletal: Positive for arthralgias and gait  problem.  All other systems reviewed and are negative.   Allergies  Hydromorphone and Floxin [ofloxacin]  Home Medications   Prior to Admission medications   Medication Sig Start Date End Date Taking? Authorizing Provider  dicyclomine (BENTYL) 10 MG capsule Take 10 mg by mouth 3 (three) times daily as needed for spasms.    Yes Historical Provider, MD  empagliflozin (JARDIANCE) 10 MG TABS tablet Take 10 mg by mouth every morning.    Yes Historical Provider, MD  loratadine (CLARITIN) 10 MG tablet Take 10 mg by mouth daily as needed for allergies. Reported on 06/27/2015   Yes Historical Provider, MD  LYRICA 50 MG capsule Take 50 mg by mouth every morning. 06/07/15  Yes Historical Provider, MD  omeprazole (PRILOSEC) 40 MG capsule Take 40 mg by mouth at bedtime.    Yes Historical Provider, MD  pregabalin (LYRICA) 150 MG capsule Take 1 capsule (150 mg total) by mouth 2 (two) times daily. 06/21/15  Yes Loletha Grayer, MD  tamsulosin (FLOMAX) 0.4 MG CAPS capsule Take 0.4 mg by mouth at bedtime.    Yes Historical Provider, MD  traMADol (ULTRAM) 50 MG tablet Take 50 mg by mouth every 6 (six) hours as needed for moderate pain. for pain   Yes Historical Provider, MD  citalopram (CELEXA) 20 MG tablet Take 20 mg by mouth at bedtime. Reported on 06/28/2015    Historical Provider, MD  naproxen (NAPROSYN) 500 MG tablet Take 1 tablet (500 mg total) by mouth 2 (two) times daily with a meal. 12/11/15   Lorin Picket, PA-C  oxybutynin (DITROPAN) 5 MG tablet Take 1 tablet (5 mg total) by mouth every 8 (eight) hours as needed for bladder spasms. 06/28/15   Hollice Espy, MD  terbinafine (LAMISIL) 250 MG tablet TAKE 1 TABLET (250 MG TOTAL) BY MOUTH ONCE DAILY. 06/10/15   Historical Provider, MD   Meds Ordered and Administered this Visit  Medications - No data to display  BP 120/77 (BP Location: Left Arm)   Pulse 60   Temp 98.2 F (36.8 C) (Oral)   Resp 16   Ht 6\' 2"  (1.88 m)   Wt 275 lb (124.7 kg)   SpO2 99%   BMI 35.31 kg/m  No data found.   Physical Exam  Constitutional: He is oriented to person, place, and time. He appears well-developed and well-nourished. No distress.  HENT:  Head: Normocephalic and atraumatic.  Eyes: EOM are normal. Pupils are equal, round, and reactive to light.  Neck: Normal range of motion. Neck supple.  Musculoskeletal: He exhibits tenderness.  Examination of the left knee shows no effusion. The patient had  has a strong quad with good control. Maximal tenderness is along the distal medial femur just above the joint line. There is no specific joint line tenderness present. Medial collateral ligaments are strong to clinical testing. He has a negative anterior drawer  posterior drawer signs. Is a negative McMurray's present today. Patellofemoral tenderness to compression and retropatellar tenderness is present. He does exhibit an antalgic gait  Neurological: He is alert and oriented to person, place, and time.  Skin: Skin is warm and dry. He is not diaphoretic.  Psychiatric: He has a normal mood and affect. His behavior is normal. Judgment and thought content normal.  Nursing note and vitals reviewed.   Urgent Care Course   Clinical Course    Procedures (including critical care time)  Labs Review Labs Reviewed - No data to display  Imaging Review Dg Knee Complete  4 Views Left  Result Date: 12/11/2015 CLINICAL DATA:  Medial left knee pain for 1 week.  No known injury. EXAM: LEFT KNEE - COMPLETE 4+ VIEW COMPARISON:  None. FINDINGS: Joint space narrowing and spurring throughout the left knee, most pronounced in the medial and patellofemoral compartments. No acute bony abnormality. Specifically, no fracture, subluxation, or dislocation. Soft tissues are intact. No joint effusion. IMPRESSION: Mild-to-moderate degenerative changes.  No acute bony abnormality. Electronically Signed   By: Rolm Baptise M.D.   On: 12/11/2015 09:35    Visual Acuity Review  Right Eye Distance:   Left Eye Distance:   Bilateral Distance:    Right Eye Near:   Left Eye Near:    Bilateral Near:         MDM   1. Primary osteoarthritis of left knee    Discharge Medication List as of 12/11/2015  9:59 AM    START taking these medications   Details  naproxen (NAPROSYN) 500 MG tablet Take 1 tablet (500 mg total) by mouth 2 (two) times daily with a meal., Starting Sun 12/11/2015, Normal      Plan: 1. Test/x-ray  results and diagnosis reviewed with patient 2. rx as per orders; risks, benefits, potential side effects reviewed with patient 3. Recommend supportive treatment with heat or ice as necessary. Maintain good quad muscle I recommended he be followed up with an orthopedic surgeon for possible knee injection following as he will most likely require total knee arthroplasty in the future. [facility]. They will make the appointment.  4. F/u prn if symptoms worsen or don't improve     Lorin Picket, PA-C 12/11/15 1007

## 2015-12-17 ENCOUNTER — Encounter: Payer: Self-pay | Admitting: *Deleted

## 2015-12-17 ENCOUNTER — Ambulatory Visit
Admission: EM | Admit: 2015-12-17 | Discharge: 2015-12-17 | Disposition: A | Discharge status: Home / Self Care | Payer: 59 | Attending: Family Medicine | Admitting: Family Medicine

## 2015-12-17 ENCOUNTER — Ambulatory Visit (INDEPENDENT_AMBULATORY_CARE_PROVIDER_SITE_OTHER): Payer: 59

## 2015-12-17 DIAGNOSIS — L03115 Cellulitis of right lower limb: Secondary | ICD-10-CM

## 2015-12-17 MED ORDER — INDOMETHACIN 50 MG PO CAPS: 50.0000 mg | ORAL_CAPSULE | Freq: Three times a day (TID) | ORAL | 0 refills | Status: DC

## 2015-12-17 MED ORDER — DOXYCYCLINE HYCLATE 100 MG PO CAPS: 100.0000 mg | ORAL_CAPSULE | Freq: Two times a day (BID) | ORAL | 0 refills | Status: DC

## 2015-12-17 NOTE — ED Triage Notes (Signed)
Right foot pain, edema, discoloration to dorsal aspect and arch. Onset 2-3 days ago, denies injury. Difficulty bearing weight. Pt is NIDDM.

## 2015-12-17 NOTE — ED Provider Notes (Signed)
CSN: [id number]     Arrival date & time 12/17/15  1411 History   First MD Initiated Contact with Patient 12/17/15 1449     Chief Complaint  Patient presents with  . Foot Pain  . Foot Swelling   HPI patient presents today for evaluation of right foot swelling and pain ongoing for 2 - 3 days. The patient denies any history of trauma or injury to the right foot, patient is a diabetic with existing diabetic neuropathy on metformin but not insulin. He does a lot of manual labor, reports that he is often wearing boots keeping his feet in a dark, moist environment. He denies any fevers are chills at home. He has noticed the loss of range of motion to the right foot due to swelling. He also notes bruising on the dorsum of the right foot. Pain along the medial lateral aspect of ankles will. Denies any changes to his existing neuropathy. Pain is worse with weight-bearing and range of motion to the right foot.  Past Medical History:  Diagnosis Date  . Acid indigestion 02/14/2012  . Anxiety   . Arthritis   . 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 Cancer ([address]) 2012   R kidney removed  . Clostridium difficile infection 02/04/2014  . Degeneration of intervertebral disc of lumbar region 08/23/2011  . Diabetes mellitus ([place]) 07/24/2011  . Diabetes mellitus without complication ([place])   . DM (diabetes mellitus) type 2, uncontrolled, with ketoacidosis ([place]) 05/30/2015  . Enthesopathy of hip 04/11/2015  . Fatty liver disease, nonalcoholic 123456  . GERD (gastroesophageal reflux disease)   . Hepatic cirrhosis ([place]) 09/12/2014  . Hypertension   . Lumbar radiculopathy 09/05/2011  . 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  Marland Kitchen Thoracic and lumbosacral neuritis 07/24/2011   Past Surgical History:  Procedure Laterality Date  . BACK SURGERY    . CARDIAC CATHETERIZATION Right 06/20/2015   Procedure: Left Heart Cath and Coronary Angiography;  Surgeon: Dionisio David, MD;  Location: [facility];  Service: Cardiovascular;  Laterality: Right;  . CYSTOSCOPY W/ RETROGRADES Left 05/18/2015   Procedure: CYSTOSCOPY WITH RETROGRADE PYELOGRAM;  Surgeon: Nickie Retort, MD;  Location: [facility];  Service: Urology;  Laterality: Left;  . CYSTOSCOPY WITH STENT PLACEMENT Left 05/18/2015   Procedure: CYSTOSCOPY WITH STENT PLACEMENT;  Surgeon: Nickie Retort, MD;  Location: [facility];  Service: Urology;  Laterality: Left;  . CYSTOSCOPY WITH STENT PLACEMENT Left 06/28/2015   Procedure: CYSTOSCOPY WITH STENT PLACEMENT/ EXCHANGE;  Surgeon: Hollice Espy, MD;  Location: [facility];  Service: Urology;  Laterality: Left;  . kidney removed Right    2012  . NEPHRECTOMY Right 04/2011  . SPINE SURGERY     lumbar 2013  . URETEROSCOPY WITH HOLMIUM LASER LITHOTRIPSY Left 05/18/2015   Procedure: URETEROSCOPY WITH HOLMIUM LASER LITHOTRIPSY;  Surgeon: Nickie Retort, MD;  Location: [facility];  Service: Urology;  Laterality: Left;  . URETEROSCOPY WITH HOLMIUM LASER LITHOTRIPSY Left 06/28/2015   Procedure: URETEROSCOPY WITH HOLMIUM LASER LITHOTRIPSY;  Surgeon: Hollice Espy, MD;  Location: [facility];  Service: Urology;  Laterality: Left;   Family History  Problem Relation Age of Onset  . Diabetes Mother   . Hypertension Mother   . Asthma Mother   . Heart disease Father   . Kidney disease Father   . Diabetes Father   . Stroke Father    Social History  Substance Use Topics  . Smoking status: Never Smoker  .  Smokeless tobacco: Never Used  . Alcohol use No    Review of Systems  Constitutional: Negative for chills and fever.  HENT: Negative.   Eyes: Negative.   Respiratory: Negative.   Cardiovascular: Negative.   Gastrointestinal: Negative.   Endocrine: Negative.   Genitourinary: Negative.   Musculoskeletal: Positive for joint swelling and myalgias.  Skin: Positive for color change.  Allergic/Immunologic: Negative.   Neurological: Negative.   Hematological: Negative.    Psychiatric/Behavioral: Negative.     Allergies  Hydromorphone and Floxin [ofloxacin]  Home Medications   Prior to Admission medications   Medication Sig Start Date End Date Taking? Authorizing Provider  citalopram (CELEXA) 20 MG tablet Take 20 mg by mouth at bedtime. Reported on 06/28/2015   Yes Historical Provider, MD  dicyclomine (BENTYL) 10 MG capsule Take 10 mg by mouth 3 (three) times daily as needed for spasms.    Yes Historical Provider, MD  empagliflozin (JARDIANCE) 10 MG TABS tablet Take 10 mg by mouth every morning.   Yes Historical Provider, MD  LYRICA 50 MG capsule Take 50 mg by mouth every morning. 06/07/15  Yes Historical Provider, MD  pregabalin (LYRICA) 150 MG capsule Take 1 capsule (150 mg total) by mouth 2 (two) times daily. 06/21/15  Yes Loletha Grayer, MD  tamsulosin (FLOMAX) 0.4 MG CAPS capsule Take 0.4 mg by mouth at bedtime.    Yes Historical Provider, MD  traMADol (ULTRAM) 50 MG tablet Take 50 mg by mouth every 6 (six) hours as needed for moderate pain. for pain   Yes Historical Provider, MD  doxycycline (VIBRAMYCIN) 100 MG capsule Take 1 capsule (100 mg total) by mouth 2 (two) times daily. 12/17/15   Lattie Corns, PA-C  indomethacin (INDOCIN) 50 MG capsule Take 1 capsule (50 mg total) by mouth 3 (three) times daily with meals. 12/17/15   Lattie Corns, PA-C  loratadine (CLARITIN) 10 MG tablet Take 10 mg by mouth daily as needed for allergies. Reported on 06/27/2015    Historical Provider, MD  naproxen (NAPROSYN) 500 MG tablet Take 1 tablet (500 mg total) by mouth 2 (two) times daily with a meal. 12/11/15   Lorin Picket, PA-C  omeprazole (PRILOSEC) 40 MG capsule Take 40 mg by mouth at bedtime.     Historical Provider, MD  oxybutynin (DITROPAN) 5 MG tablet Take 1 tablet (5 mg total) by mouth every 8 (eight) hours as needed for bladder spasms. 06/28/15   Hollice Espy, MD  terbinafine (LAMISIL) 250 MG tablet TAKE 1 TABLET (250 MG TOTAL) BY MOUTH ONCE DAILY.  06/10/15   Historical Provider, MD   Meds Ordered and Administered this Visit  Medications - No data to display  BP 135/84 (BP Location: Left Arm)   Pulse 73   Temp 98 F (36.7 C) (Oral)   Resp 16   Ht 6\' 2"  (1.88 m)   Wt 276 lb (125.2 kg)   SpO2 99%   BMI 35.44 kg/m  No data found.  Physical Exam  skin inspection of the right foot demonstrates moderate swelling on the dorsum with erythema present to the dorsal aspect of foot and mild ecchymosis at the MTP joints of the 2nd through 4th digits. Maupin palpation over the lateral as well as medial ankle joint line. Patient has decreased plantarflexion dorsiflexion due to swelling. Decreased inversion and eversion due to swelling. Patient has decreased sensation to touch the right foot. No streaking noted up the patient's leg.  Negative Homan's to the right lower extremity.  Urgent Care Course   Clinical Course    Procedures (including critical care time)  Labs Review Labs Reviewed - No data to display  Imaging Review Dg Ankle Complete Right  Result Date: 12/17/2015 CLINICAL DATA:  Right ankle and foot painful swelling x two days with no known trauma or injury. Pt denies hx of pervious injury or trauma. EXAM: RIGHT ANKLE - COMPLETE 3+ VIEW COMPARISON:  None. FINDINGS: Ankle mortise intact. The talar dome is normal. No malleolar fracture. The calcaneus is normal. Spurring of the calcaneus noted. No soft tissue abnormality. IMPRESSION: No acute osseous abnormality. Electronically Signed   By: Suzy Bouchard M.D.   On: 12/17/2015 15:50   Dg Foot Complete Right  Result Date: 12/17/2015 CLINICAL DATA:  RIGHT foot pain.  No injury EXAM: RIGHT FOOT COMPLETE - 3+ VIEW COMPARISON:  None. FINDINGS: No fracture dislocation. No significant arthropathy. No soft tissue abnormality. Mild spurring of the calcaneus. IMPRESSION: No explanation foot pain. Electronically Signed   By: Suzy Bouchard M.D.   On: 12/17/2015 15:52     MDM   1.  Cellulitis of right lower extremity    1.  Treatment options were discussed today with the patient. 2.  Due to diffuse erythema, doubtful gout flare, appearing more cellulitis. 3.  Start doxycycline for 10 days, indomethacin for pain control 4.  If no improvement in next 4-5 days, should follow-up at ER or PCP as he may require IV Abx.    Lattie Corns, [place] 12/17/15 [phone]

## 2016-02-03 DIAGNOSIS — E1142 Type 2 diabetes mellitus with diabetic polyneuropathy: Secondary | ICD-10-CM | POA: Insufficient documentation

## 2016-03-08 DIAGNOSIS — S86311A Strain of muscle(s) and tendon(s) of peroneal muscle group at lower leg level, right leg, initial encounter: Secondary | ICD-10-CM | POA: Insufficient documentation

## 2016-05-03 IMAGING — MR MR THORACIC SPINE W/O CM
6 series · 48 of 48 positions shown · non-contrast
Comparison: 06/19/2015 plain film exam. 05/02/2012 thoracic spine
MR.

CLINICAL DATA: 54-year-old male with back pain and left rib pain.
Subsequent encounter.

EXAM:
MRI THORACIC SPINE WITHOUT CONTRAST
TECHNIQUE: Multiplanar, multisequence MR imaging of the thoracic spine was
performed. No intravenous contrast was administered.

[Series 4: T2 · sagittal · 4.0mm · 1.33mm/px · 5 of 15 slices shown (1 of 2)]
[im 1/15]
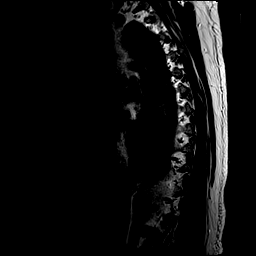
[im 4/15]
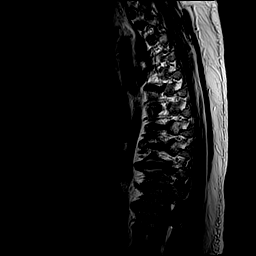
[im 8/15]
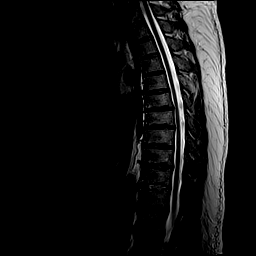
[im 11/15]
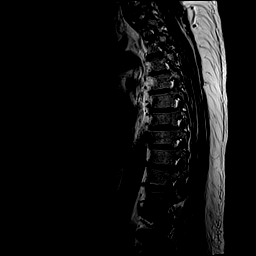
[im 15/15]
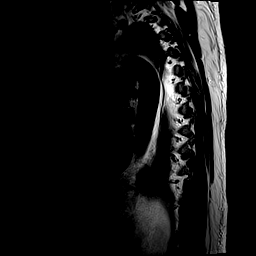

[Series 5: T1 · sagittal · 4.0mm · 1.33mm/px · 5 of 15 slices shown]
[im 1/15]
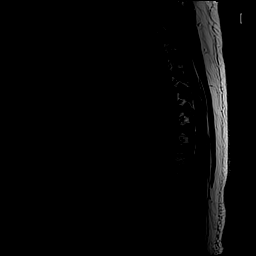
[im 4/15]
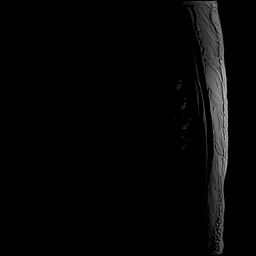
[im 8/15]
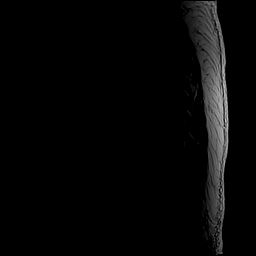
[im 11/15]
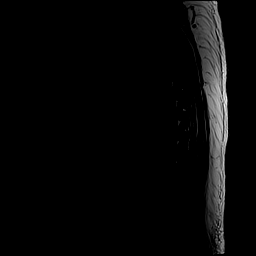
[im 15/15]
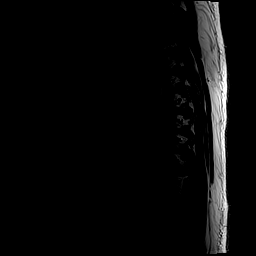

[Series 6: STIR · sagittal · 4.0mm · 1.33mm/px · 5 of 15 slices shown]
[im 1/15]
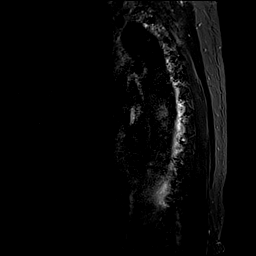
[im 4/15]
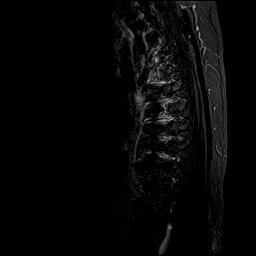
[im 8/15]
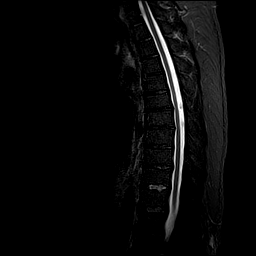
[im 11/15]
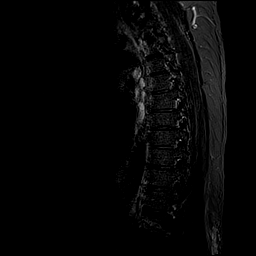
[im 15/15]
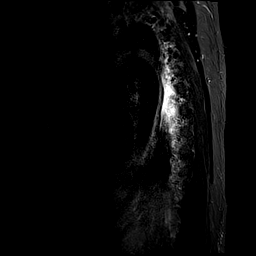

[Series 7: T2 · axial · 5.0mm · 0.86mm/px · z∈[-276,-6]mm · 15 of 45 slices shown (2 of 2)]
[im 1/45]
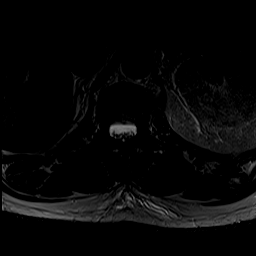
[im 4/45]
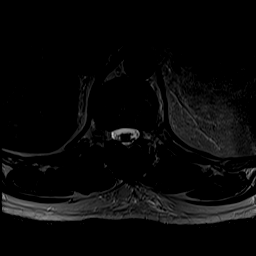
[im 7/45]
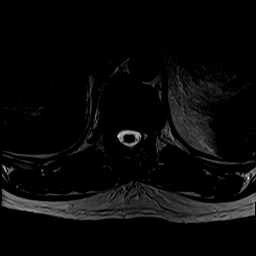
[im 10/45]
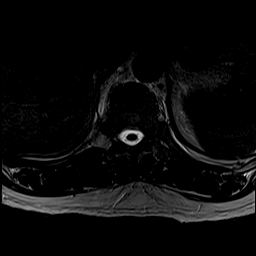
[im 13/45]
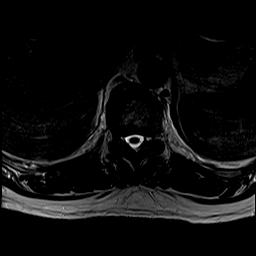
[im 16/45]
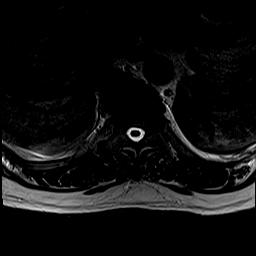
[im 19/45]
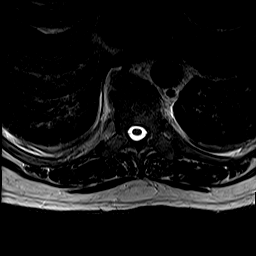
[im 23/45]
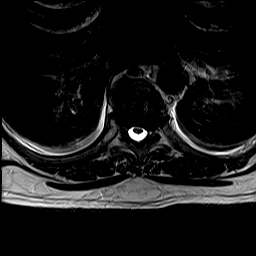
[im 26/45]
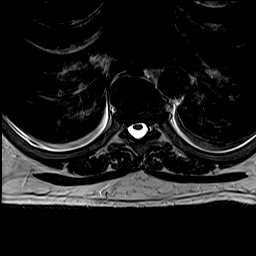
[im 29/45]
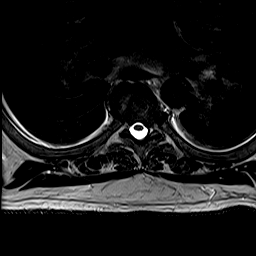
[im 32/45]
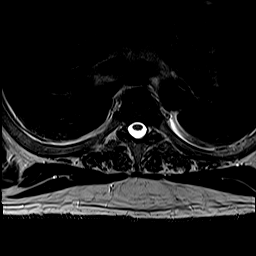
[im 35/45]
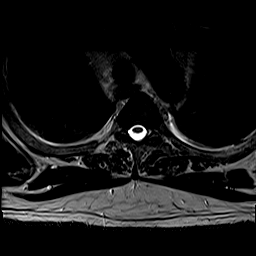
[im 38/45]
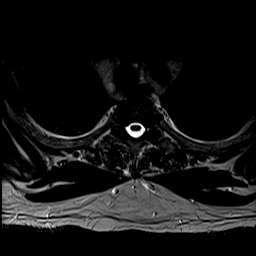
[im 41/45]
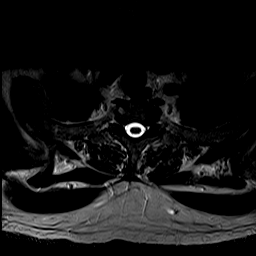
[im 45/45]
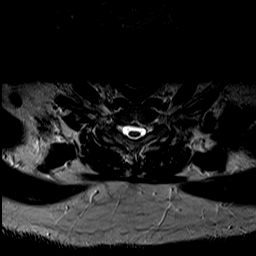

[Series 8: mpgr ax (3 · axial · 5.0mm · 0.78mm/px · z∈[-277,-3]mm · 15 of 45 slices shown]
[im 1/45]
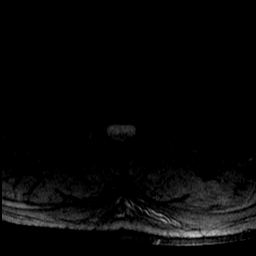
[im 4/45]
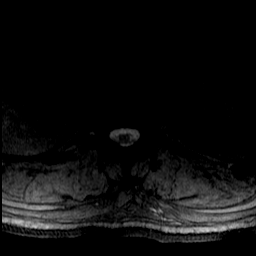
[im 7/45]
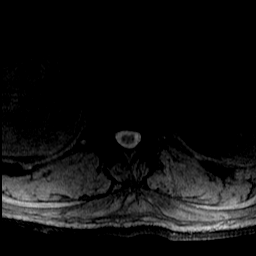
[im 10/45]
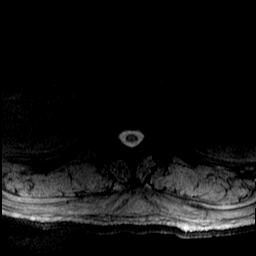
[im 13/45]
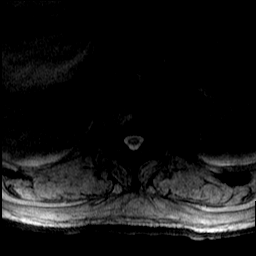
[im 16/45]
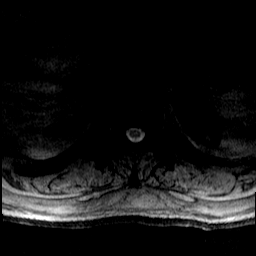
[im 19/45]
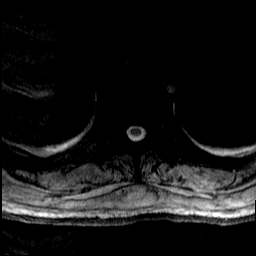
[im 23/45]
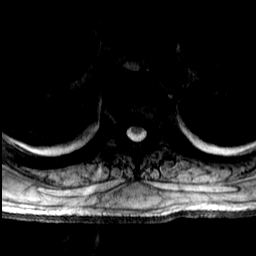
[im 26/45]
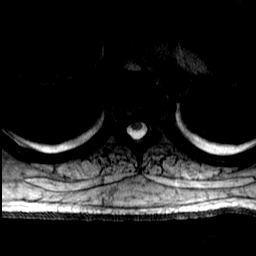
[im 29/45]
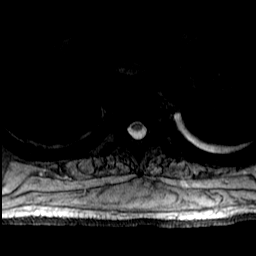
[im 32/45]
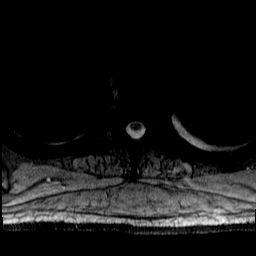
[im 35/45]
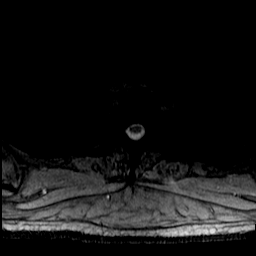
[im 38/45]
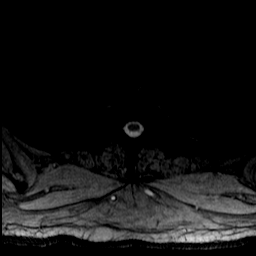
[im 41/45]
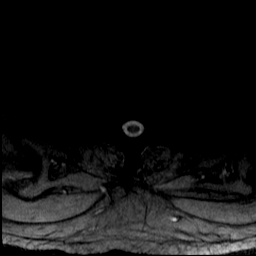
[im 45/45]
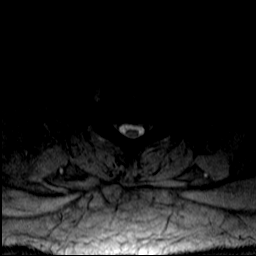

[Series 100: sag counter · sagittal · 5.0mm · 0.68mm/px · 3 of 9 slices shown]
[im 1/9]
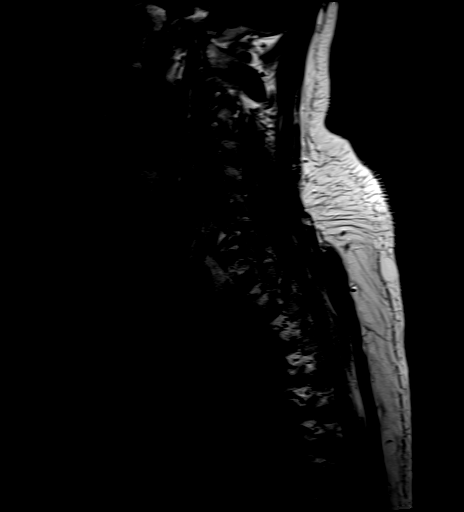
[im 5/9]
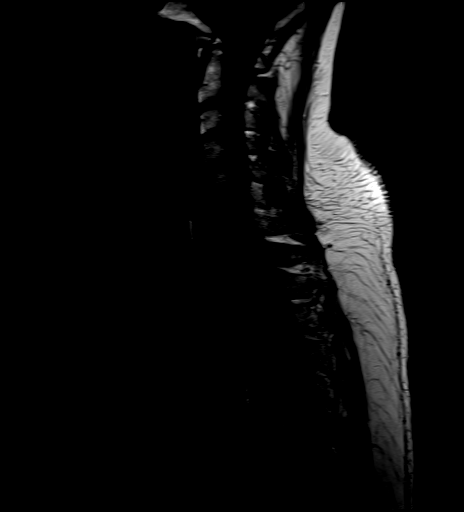
[im 9/9]
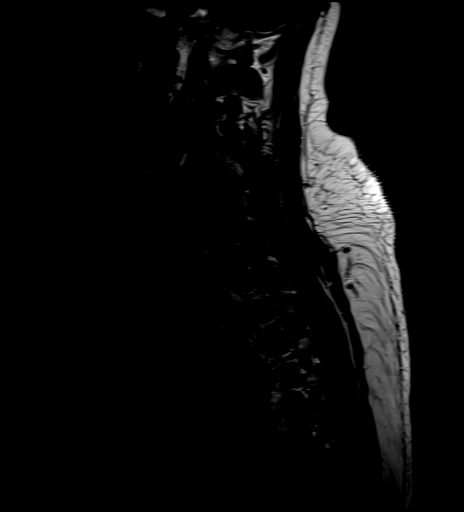

[48 of 48 positions shown; findings below may reference images not displayed]

FINDINGS: On the single sagittal scout exam obtained through the cervical
spine for proper level assignment, C5-6 cervical spondylotic changes
with spinal stenosis and foraminal narrowing is noted but
incompletely assessed. MR cervical spine would be necessary for
further delineation.

Very small bilateral pleural effusions. Limited evaluation of
mediastinal structures.

No worrisome osseous lesion.

No focal thoracic cord signal abnormality.

T1-2 through T4-5 are unremarkable.

T5-6:  Minimal bulge.

T6-7: Minimal Schmorl's node deformity. Minimal bulge greater to the
right. Slight narrowing ventral thecal sac. Minimal right-sided cord
contact.

T7-8: Schmorl's Schmorl's node deformity. Minimal bulge greater to
left. Slight narrowing ventral thecal sac with minimal left-sided
cord contact.

T8-9: Minimal Schmorl's node deformity.  Minimal bulge.

T9-10:  Minimal Schmorl's node deformity.  Minimal bulge.

T10-11: Small Schmorl's node deformity. Minimal bulge. Minimal
narrowing ventral thecal sac. Minimal posterior element hypertrophy.

T10-11: Minimal Schmorl's node deformity.

T12-L1:  Small Schmorl's node deformity.  Minimal bulge.
IMPRESSION: Minimal degenerative changes mid to lower thoracic spine as detailed
above. On the left most notable finding is at the T7-8 level.

C5-6 cervical spondylotic changes with spinal stenosis and foraminal
narrowing noted but incompletely assessed. MR cervical spine would
be necessary for further delineation.

Very small bilateral pleural effusions.

## 2016-05-03 IMAGING — NM NM PULMONARY VENT & PERF
2 series · 16 of 16 positions shown · non-contrast
Comparison: None.

CLINICAL DATA: Chest pain.

EXAM:
NUCLEAR MEDICINE VENTILATION - PERFUSION LUNG SCAN
TECHNIQUE: Ventilation images were obtained in multiple projections using
inhaled aerosol Xc-JJm DTPA. Perfusion images were obtained in
multiple projections after intravenous injection of Xc-JJm MAA.
RADIOPHARMACEUTICALS:  32.3 millicuries Gechnetium-KKm DTPA aerosol
inhalation and 4.1 millicuries Gechnetium-KKm MAA IV

[Series 1000: lung ventilation · 3.90mm/px · 4 acquisitions, 8 frames shown]
[im 1/4]
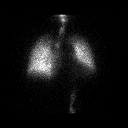
[im 1/4]
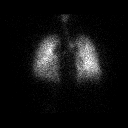
[im 2/4]
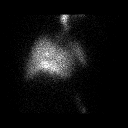
[im 2/4]
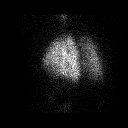
[im 3/4  full-range]
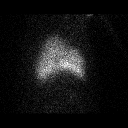
[im 3/4  full-range]
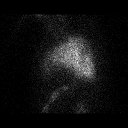
[im 4/4]
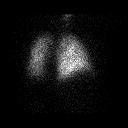
[im 4/4]
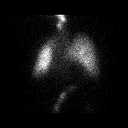

[Series 1000: lung perfusion · 1.95mm/px · 4 acquisitions, 8 frames shown]
[im 1/4]
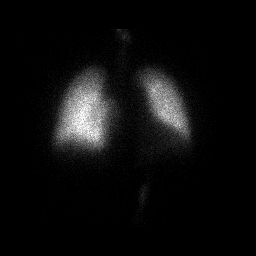
[im 1/4]
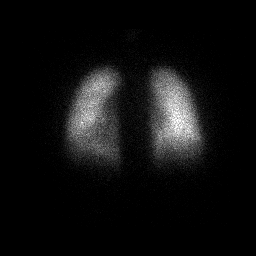
[im 2/4]
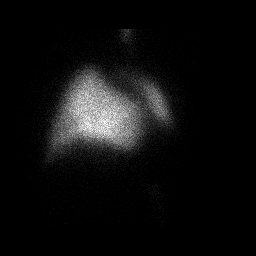
[im 2/4]
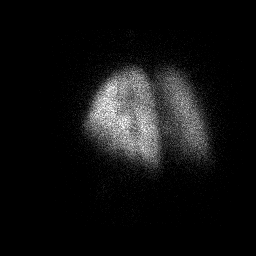
[im 3/4  full-range]
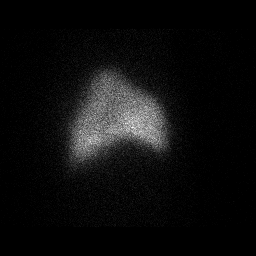
[im 3/4  full-range]
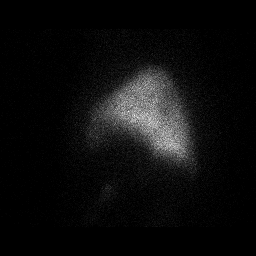
[im 4/4]
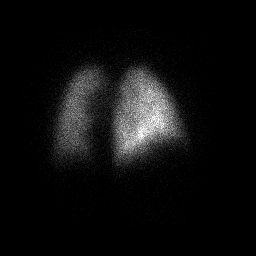
[im 4/4]
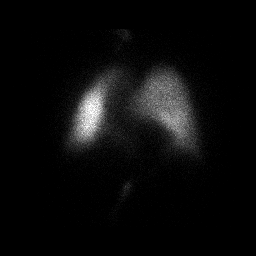

[16 of 16 positions shown; findings below may reference images not displayed]

FINDINGS: Ventilation: No focal ventilation defect.

Perfusion: No wedge shaped peripheral perfusion defects to suggest
acute pulmonary embolism.
IMPRESSION: No evidence of pulmonary embolus.

## 2016-07-09 DIAGNOSIS — M170 Bilateral primary osteoarthritis of knee: Secondary | ICD-10-CM | POA: Insufficient documentation

## 2016-07-19 DIAGNOSIS — E781 Pure hyperglyceridemia: Secondary | ICD-10-CM | POA: Insufficient documentation

## 2016-07-19 DIAGNOSIS — N200 Calculus of kidney: Secondary | ICD-10-CM | POA: Insufficient documentation

## 2016-07-25 ENCOUNTER — Ambulatory Visit
Admission: RE | Admit: 2016-07-25 | Discharge: 2016-07-25 | Disposition: A | Discharge status: Home / Self Care | Payer: 59 | Source: Ambulatory Visit | Attending: Urology | Admitting: Urology

## 2016-07-25 ENCOUNTER — Encounter: Payer: Self-pay | Admitting: Urology

## 2016-07-25 ENCOUNTER — Ambulatory Visit (INDEPENDENT_AMBULATORY_CARE_PROVIDER_SITE_OTHER): Payer: 59 | Admitting: Urology

## 2016-07-25 VITALS — BP 146/89 | HR 69 | Ht 74.0 in | Wt 266.2 lb

## 2016-07-25 DIAGNOSIS — Q600 Renal agenesis, unilateral: Secondary | ICD-10-CM | POA: Diagnosis not present

## 2016-07-25 DIAGNOSIS — IMO0002 Reserved for concepts with insufficient information to code with codable children: Secondary | ICD-10-CM

## 2016-07-25 DIAGNOSIS — R3915 Urgency of urination: Secondary | ICD-10-CM

## 2016-07-25 DIAGNOSIS — N2889 Other specified disorders of kidney and ureter: Secondary | ICD-10-CM | POA: Diagnosis not present

## 2016-07-25 DIAGNOSIS — Z905 Acquired absence of kidney: Secondary | ICD-10-CM | POA: Diagnosis not present

## 2016-07-25 DIAGNOSIS — N200 Calculus of kidney: Secondary | ICD-10-CM | POA: Insufficient documentation

## 2016-07-25 NOTE — Progress Notes (Signed)
07/25/2016 8:58 AM   Anthony Fuller Aug 12, 1960 [id number]  Referring provider: Valera Castle, MD [address] [place], Anthony [place] [id number]  Chief Complaint  Patient presents with  . 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 He presented to [facility] ED 3/8 with left flank pain. CT report with several LLP stones, largest 4 mm. No UA. Bun  21, cr 0.96. I did not see a UA.   He has a h/o stones. He was taken in 2017 for staged left URS to clear stones (up to a 10 mm LLP).   Today, pt is seen for the above. UA is clear. He's had no stone passage. He still has occasional flank pain and pain in the left groin. Discomfort is intermittent. A nagging, dull ache. Nothing seems to make it better or worse. No associated signs or symptoms. He voids with a good flow, but has some PV dribble. Also, he has some urgency. PSA was 0.18 Dec 2014.   PMH: Past Medical History:  Diagnosis Date  . Acid indigestion 02/14/2012  . Anxiety   . Arthritis   . 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 Cancer ([place]) 2012   R kidney removed  . Clostridium difficile infection 02/04/2014  . Degeneration of intervertebral disc of lumbar region 08/23/2011  . Diabetes mellitus ([place]) 07/24/2011  . Diabetes mellitus without complication ([place])   . DM (diabetes mellitus) type 2, uncontrolled, with ketoacidosis ([place]) 05/30/2015  . Enthesopathy of hip 04/11/2015  . Fatty liver disease, nonalcoholic 08/13/6382  . GERD (gastroesophageal reflux disease)   . Hepatic cirrhosis ([place]) 09/12/2014  . Hypertension   . Lumbar radiculopathy 09/05/2011  . 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  Marland Kitchen Thoracic and lumbosacral neuritis 07/24/2011    Surgical History: Past Surgical History:  Procedure Laterality Date  . BACK SURGERY    . CARDIAC CATHETERIZATION Right 06/20/2015   Procedure: Left Heart Cath and Coronary Angiography;  Surgeon: Dionisio David, MD;   Location: [facility];  Service: Cardiovascular;  Laterality: Right;  . CYSTOSCOPY W/ RETROGRADES Left 05/18/2015   Procedure: CYSTOSCOPY WITH RETROGRADE PYELOGRAM;  Surgeon: Nickie Retort, MD;  Location: [facility];  Service: Urology;  Laterality: Left;  . CYSTOSCOPY WITH STENT PLACEMENT Left 05/18/2015   Procedure: CYSTOSCOPY WITH STENT PLACEMENT;  Surgeon: Nickie Retort, MD;  Location: [facility];  Service: Urology;  Laterality: Left;  . CYSTOSCOPY WITH STENT PLACEMENT Left 06/28/2015   Procedure: CYSTOSCOPY WITH STENT PLACEMENT/ EXCHANGE;  Surgeon: Hollice Espy, MD;  Location: [facility];  Service: Urology;  Laterality: Left;  . kidney removed Right    2012  . NEPHRECTOMY Right 04/2011  . SPINE SURGERY     lumbar 2013  . URETEROSCOPY WITH HOLMIUM LASER LITHOTRIPSY Left 05/18/2015   Procedure: URETEROSCOPY WITH HOLMIUM LASER LITHOTRIPSY;  Surgeon: Nickie Retort, MD;  Location: [facility];  Service: Urology;  Laterality: Left;  . URETEROSCOPY WITH HOLMIUM LASER LITHOTRIPSY Left 06/28/2015   Procedure: URETEROSCOPY WITH HOLMIUM LASER LITHOTRIPSY;  Surgeon: Hollice Espy, MD;  Location: [facility];  Service: Urology;  Laterality: Left;    Home Medications:  Allergies as of 07/25/2016      Reactions   Hydromorphone Other (See Comments)   Other reaction(s): Other (See Comments) Severe hypotension Reaction:  Drops pts BP    Aspirin    Other reaction(s): Other (See Comments) Hx GI bleed  Floxin [ofloxacin] Rash      Medication List       Accurate as of 07/25/16  8:58 AM. Always use your most recent med list.          acetaminophen 325 MG tablet Commonly known as:  TYLENOL Take 650 mg by mouth.   aspirin 81 MG chewable tablet Chew 81 mg by mouth.   atorvastatin 40 MG tablet Commonly known as:  LIPITOR Take 40 mg by mouth.   cetirizine 10 MG tablet Commonly known as:  ZYRTEC Take 10 mg by mouth.   citalopram 20 MG tablet Commonly known as:  CELEXA Take 20 mg  by mouth at bedtime. Reported on 06/28/2015   dicyclomine 10 MG capsule Commonly known as:  BENTYL Take 10 mg by mouth 3 (three) times daily as needed for spasms.   doxycycline 100 MG capsule Commonly known as:  VIBRAMYCIN Take 1 capsule (100 mg total) by mouth 2 (two) times daily.   empagliflozin 10 MG Tabs tablet Commonly known as:  JARDIANCE Take 10 mg by mouth every morning.   indomethacin 50 MG capsule Commonly known as:  INDOCIN Take 1 capsule (50 mg total) by mouth 3 (three) times daily with meals.   loratadine 10 MG tablet Commonly known as:  CLARITIN Take 10 mg by mouth daily as needed for allergies. Reported on 06/27/2015   metFORMIN 500 MG 24 hr tablet Commonly known as:  GLUCOPHAGE-XR TAKE 2 TABLETS (1,000 MG TOTAL) BY MOUTH DAILY WITH DINNER.   naproxen 500 MG tablet Commonly known as:  NAPROSYN Take 1 tablet (500 mg total) by mouth 2 (two) times daily with a meal.   nitroGLYCERIN 0.3 MG SL tablet Commonly known as:  NITROSTAT Place 0.3 mg under the tongue.   omeprazole 40 MG capsule Commonly known as:  PRILOSEC Take 40 mg by mouth at bedtime.   oxybutynin 5 MG tablet Commonly known as:  DITROPAN Take 1 tablet (5 mg total) by mouth every 8 (eight) hours as needed for bladder spasms.   LYRICA 50 MG capsule Generic drug:  pregabalin Take 50 mg by mouth every morning.   pregabalin 150 MG capsule Commonly known as:  LYRICA Take 1 capsule (150 mg total) by mouth 2 (two) times daily.   tamsulosin 0.4 MG Caps capsule Commonly known as:  FLOMAX Take 0.4 mg by mouth at bedtime.   terbinafine 250 MG tablet Commonly known as:  LAMISIL TAKE 1 TABLET (250 MG TOTAL) BY MOUTH ONCE DAILY.   traMADol 50 MG tablet Commonly known as:  ULTRAM Take 50 mg by mouth every 6 (six) hours as needed for moderate pain. for pain       Allergies:  Allergies  Allergen Reactions  . Hydromorphone Other (See Comments)    Other reaction(s): Other (See Comments) Severe  hypotension Reaction:  Drops pts BP   . Aspirin     Other reaction(s): Other (See Comments) Hx GI bleed  . Floxin [Ofloxacin] Rash    Family History: Family History  Problem Relation Age of Onset  . Diabetes Mother   . Hypertension Mother   . Asthma Mother   . Heart disease Father   . Kidney disease Father   . Diabetes Father   . Stroke Father     Social History:  reports that he has never smoked. He has never used smokeless tobacco. He reports that he does not drink alcohol or use drugs.  ROS:  Physical Exam: Ht 6\' 2"  (1.88 m)   Wt 120.7 kg (266 lb 3.2 oz)   BMI 34.18 kg/m   Constitutional:  Alert and oriented, No acute distress. HEENT: [place] AT, moist mucus membranes.  Trachea midline, no masses. Cardiovascular: No clubbing, cyanosis, or edema. Respiratory: Normal respiratory effort, no increased work of breathing. GI: Abdomen is soft, nontender, nondistended, no abdominal masses GU: No CVA tenderness. Penis - circumcised, no mass or lesion. Testicles descended bilaterally, palpably normal, right testicle atrophy. DRE - prostate 20 grams, no hard area or nodule.  Skin: No rashes, bruises or suspicious lesions. Lymph: No cervical or inguinal adenopathy. Neurologic: Grossly intact, no focal deficits, moving all 4 extremities. Psychiatric: Normal mood and affect.  Laboratory Data: Lab Results  Component Value Date   WBC 10.9 (H) 06/24/2015   HGB 15.6 06/24/2015   HCT 47.0 06/24/2015   MCV 87.0 06/24/2015   PLT 239 06/24/2015    Lab Results  Component Value Date   CREATININE 1.38 (H) 06/24/2015    No results found for: PSA  No results found for: TESTOSTERONE  Lab Results  Component Value Date   HGBA1C 7.4 (H) 05/27/2015    Urinalysis    Component Value Date/Time   COLORURINE RED (A) 06/24/2015 2310   APPEARANCEUR Cloudy (A) 06/27/2015 1035   LABSPEC 1.025 06/24/2015 2310   LABSPEC 1.029  05/31/2014 0637   PHURINE 6.0 06/24/2015 2310   GLUCOSEU 3+ (A) 06/27/2015 1035   GLUCOSEU 50 mg/dL 05/31/2014 0637   HGBUR 3+ (A) 06/24/2015 2310   BILIRUBINUR Negative 06/27/2015 1035   BILIRUBINUR Negative 05/31/2014 [place] 06/24/2015 2310   PROTEINUR 1+ (A) 06/27/2015 1035   PROTEINUR 30 (A) 06/24/2015 2310   NITRITE Negative 06/27/2015 1035   NITRITE NEGATIVE 06/24/2015 2310   LEUKOCYTESUR Negative 06/27/2015 1035   LEUKOCYTESUR Negative 05/31/2014 0637    Pertinent Imaging: report   Assessment & Plan:   1. Nephrolithiasis - check KUB. Discussed with patient nature r/b of surveillance vs URS/stent which may need to be staged again.    - Urinalysis, Complete  2. Solitary kidney - ua clear today - Urinalysis, Complete  3. Urgency - UA clear. Imaging as above. Nl exam.   No Follow-up on file.  Festus Aloe, [facility] [address] [place], [place] [address] [phone]

## 2016-07-26 LAB — PSA TOTAL (REFLEX TO FREE): Prostate Specific Ag, Serum: 0.9 ng/mL (ref 0.0–4.0)

## 2016-07-27 ENCOUNTER — Telehealth: Payer: Self-pay

## 2016-07-27 NOTE — Telephone Encounter (Signed)
Festus Aloe, MD  Lestine Box, LPN        Notify patient his renal US and KUB - all normal. No blockage of kidney. No stone seen on plain xray, but a lower pole seen on the ultrasound. Everything looks OK. If he still has pain f/u with PCP.    LMOM

## 2016-07-27 NOTE — Telephone Encounter (Signed)
Festus Aloe, MD  Lestine Box, LPN        PSA normal --give result    Spoke with pt wife in reference to PSA results. Pt voiced understanding.

## 2016-07-30 NOTE — Telephone Encounter (Signed)
Spoke with the patient and gave him the message.   Sharyn Lull

## 2016-07-31 LAB — URINALYSIS, COMPLETE
Bilirubin, UA: NEGATIVE
Ketones, UA: NEGATIVE
Leukocytes, UA: NEGATIVE
Nitrite, UA: NEGATIVE
Protein, UA: NEGATIVE
RBC, UA: NEGATIVE
Specific Gravity, UA: 1.015 (ref 1.005–1.030)
Urobilinogen, Ur: 0.2 mg/dL (ref 0.2–1.0)
pH, UA: 5 (ref 5.0–7.5)

## 2016-08-05 DIAGNOSIS — L03115 Cellulitis of right lower limb: Secondary | ICD-10-CM | POA: Insufficient documentation

## 2016-08-05 DIAGNOSIS — K219 Gastro-esophageal reflux disease without esophagitis: Secondary | ICD-10-CM | POA: Insufficient documentation

## 2016-08-05 DIAGNOSIS — R030 Elevated blood-pressure reading, without diagnosis of hypertension: Secondary | ICD-10-CM | POA: Insufficient documentation

## 2016-08-16 DIAGNOSIS — G90521 Complex regional pain syndrome I of right lower limb: Secondary | ICD-10-CM | POA: Insufficient documentation

## 2016-08-16 DIAGNOSIS — S92354A Nondisplaced fracture of fifth metatarsal bone, right foot, initial encounter for closed fracture: Secondary | ICD-10-CM | POA: Insufficient documentation

## 2016-08-30 ENCOUNTER — Other Ambulatory Visit: Payer: Self-pay | Admitting: Student

## 2016-08-30 DIAGNOSIS — K7469 Other cirrhosis of liver: Secondary | ICD-10-CM

## 2016-08-31 ENCOUNTER — Encounter: Payer: Self-pay | Admitting: *Deleted

## 2016-09-03 ENCOUNTER — Ambulatory Visit: Payer: 59 | Admitting: Anesthesiology

## 2016-09-03 ENCOUNTER — Encounter: Payer: Self-pay | Admitting: *Deleted

## 2016-09-03 ENCOUNTER — Encounter
Admission: RE | Disposition: A | Discharge status: Home / Self Care | Payer: Self-pay | Source: Ambulatory Visit | Attending: Unknown Physician Specialty

## 2016-09-03 ENCOUNTER — Ambulatory Visit
Admission: RE | Admit: 2016-09-03 | Discharge: 2016-09-03 | Disposition: A | Discharge status: Home / Self Care | Payer: 59 | Source: Ambulatory Visit | Attending: Unknown Physician Specialty | Admitting: Unknown Physician Specialty

## 2016-09-03 DIAGNOSIS — K7460 Unspecified cirrhosis of liver: Secondary | ICD-10-CM | POA: Insufficient documentation

## 2016-09-03 DIAGNOSIS — M170 Bilateral primary osteoarthritis of knee: Secondary | ICD-10-CM | POA: Insufficient documentation

## 2016-09-03 DIAGNOSIS — Z833 Family history of diabetes mellitus: Secondary | ICD-10-CM | POA: Diagnosis not present

## 2016-09-03 DIAGNOSIS — Z85528 Personal history of other malignant neoplasm of kidney: Secondary | ICD-10-CM | POA: Insufficient documentation

## 2016-09-03 DIAGNOSIS — K760 Fatty (change of) liver, not elsewhere classified: Secondary | ICD-10-CM | POA: Diagnosis not present

## 2016-09-03 DIAGNOSIS — E1142 Type 2 diabetes mellitus with diabetic polyneuropathy: Secondary | ICD-10-CM | POA: Diagnosis not present

## 2016-09-03 DIAGNOSIS — Z79899 Other long term (current) drug therapy: Secondary | ICD-10-CM | POA: Diagnosis not present

## 2016-09-03 DIAGNOSIS — Z905 Acquired absence of kidney: Secondary | ICD-10-CM | POA: Insufficient documentation

## 2016-09-03 DIAGNOSIS — K219 Gastro-esophageal reflux disease without esophagitis: Secondary | ICD-10-CM | POA: Diagnosis not present

## 2016-09-03 DIAGNOSIS — F419 Anxiety disorder, unspecified: Secondary | ICD-10-CM | POA: Insufficient documentation

## 2016-09-03 DIAGNOSIS — I10 Essential (primary) hypertension: Secondary | ICD-10-CM | POA: Diagnosis not present

## 2016-09-03 DIAGNOSIS — R1310 Dysphagia, unspecified: Secondary | ICD-10-CM | POA: Insufficient documentation

## 2016-09-03 DIAGNOSIS — Z794 Long term (current) use of insulin: Secondary | ICD-10-CM | POA: Diagnosis not present

## 2016-09-03 HISTORY — DX: Cervicalgia: M54.2

## 2016-09-03 HISTORY — DX: Radiculopathy, site unspecified: M54.10

## 2016-09-03 HISTORY — PX: ESOPHAGOGASTRODUODENOSCOPY (EGD) WITH PROPOFOL: SHX5813

## 2016-09-03 HISTORY — DX: Type 2 diabetes mellitus with diabetic polyneuropathy: E11.42

## 2016-09-03 HISTORY — DX: Other intervertebral disc degeneration, lumbar region without mention of lumbar back pain or lower extremity pain: M51.369

## 2016-09-03 HISTORY — DX: Other intervertebral disc degeneration, lumbar region: M51.36

## 2016-09-03 HISTORY — DX: Other chest pain: R07.89

## 2016-09-03 HISTORY — DX: Other intervertebral disc displacement, lumbar region: M51.26

## 2016-09-03 HISTORY — DX: Hydrocele, unspecified: N43.3

## 2016-09-03 HISTORY — DX: Unspecified injury of muscle(s) and tendon(s) of peroneal muscle group at lower leg level, right leg, initial encounter: S86.301A

## 2016-09-03 HISTORY — DX: Diarrhea, unspecified: R19.7

## 2016-09-03 HISTORY — DX: Spinal stenosis, cervical region: M48.02

## 2016-09-03 HISTORY — DX: Qualitative platelet defects: D69.1

## 2016-09-03 LAB — GLUCOSE, CAPILLARY: Glucose-Capillary: 110 mg/dL — ABNORMAL HIGH (ref 65–99)

## 2016-09-03 SURGERY — ESOPHAGOGASTRODUODENOSCOPY (EGD) WITH PROPOFOL
Anesthesia: General

## 2016-09-03 MED ORDER — GLYCOPYRROLATE 0.2 MG/ML IJ SOLN
INTRAMUSCULAR | Status: AC
  Filled 2016-09-03: qty 1

## 2016-09-03 MED ORDER — SODIUM CHLORIDE 0.9 % IV SOLN: INTRAVENOUS | Status: DC

## 2016-09-03 MED ORDER — SODIUM CHLORIDE 0.9 % IV SOLN
INTRAVENOUS | Status: DC
  Administered 2016-09-03: 08:00:00 via INTRAVENOUS

## 2016-09-03 MED ORDER — PROPOFOL 500 MG/50ML IV EMUL
INTRAVENOUS | Status: DC | PRN
  Administered 2016-09-03: 100 ug/kg/min via INTRAVENOUS

## 2016-09-03 MED ORDER — LIDOCAINE HCL (PF) 2 % IJ SOLN
INTRAMUSCULAR | Status: DC | PRN
  Administered 2016-09-03: 50 mg

## 2016-09-03 MED ORDER — MIDAZOLAM HCL 5 MG/5ML IJ SOLN
INTRAMUSCULAR | Status: DC | PRN
  Administered 2016-09-03: 2 mg via INTRAVENOUS

## 2016-09-03 MED ORDER — GLYCOPYRROLATE 0.2 MG/ML IJ SOLN
INTRAMUSCULAR | Status: DC | PRN
  Administered 2016-09-03: 0.2 mg via INTRAVENOUS

## 2016-09-03 MED ORDER — PROPOFOL 10 MG/ML IV BOLUS
INTRAVENOUS | Status: AC
  Filled 2016-09-03: qty 20

## 2016-09-03 MED ORDER — PROPOFOL 10 MG/ML IV BOLUS
INTRAVENOUS | Status: DC | PRN
  Administered 2016-09-03: 50 mg via INTRAVENOUS

## 2016-09-03 MED ORDER — LIDOCAINE HCL (PF) 2 % IJ SOLN
INTRAMUSCULAR | Status: AC
  Filled 2016-09-03: qty 2

## 2016-09-03 MED ORDER — MIDAZOLAM HCL 2 MG/2ML IJ SOLN
INTRAMUSCULAR | Status: AC
  Filled 2016-09-03: qty 2

## 2016-09-03 NOTE — Anesthesia Post-op Follow-up Note (Cosign Needed)
Anesthesia QCDR form completed.        

## 2016-09-03 NOTE — Op Note (Signed)
[facility] Patient Name: Anthony Fuller Procedure Date: 09/03/2016 8:28 AM MRN: [id number] Account #: [id number] Date of Birth: [birth year] Admit Type: Outpatient Age: 56 Room: [facility] ENDO ROOM 1 Gender: Male Note Status: Finalized Procedure:            Upper GI endoscopy Indications:          Dysphagia, Heartburn, Suspected gastro-esophageal                        reflux disease, Abdominal bloating, Early satiety,                        Nausea, Regurgitation Providers:            Manya Silvas, MD Medicines:            Propofol per Anesthesia Complications:        No immediate complications. Procedure:            Pre-Anesthesia Assessment:                       - After reviewing the risks and benefits, the patient                        was deemed in satisfactory condition to undergo the                        procedure.                       After obtaining informed consent, the endoscope was                        passed under direct vision. Throughout the procedure,                        the patient's blood pressure, pulse, and oxygen                        saturations were monitored continuously. The                        Colonoscope was introduced through the mouth, and                        advanced to the antrum of the stomach. The upper GI                        endoscopy was accomplished without difficulty. The                        patient tolerated the procedure well. Findings:      The examined esophagus was normal.      A large amount of food (residue) was found in the gastric body. Some       fluid was suctioned but the large amount of retained food was not       removed due to danger of aspiration. Impression:           - Normal esophagus.                       - A large amount of  food (residue) in the stomach.                       - No specimens collected. Recommendation:       - The findings and recommendations were  discussed with                        the patient's family. Try Reglan and follow uo in                        office after a few weeks. Manya Silvas, MD 09/03/2016 8:48:01 AM This report has been signed electronically. Number of Addenda: 0 Note Initiated On: 09/03/2016 8:28 AM      [facility]

## 2016-09-03 NOTE — Transfer of Care (Signed)
Immediate Anesthesia Transfer of Care Note  Patient: Anthony Fuller  Procedure(s) Performed: Procedure(s): ESOPHAGOGASTRODUODENOSCOPY (EGD) WITH PROPOFOL (N/A)  Patient Location: PACU  Anesthesia Type:General  Level of Consciousness: sedated  Airway & Oxygen Therapy: Patient Spontanous Breathing and Patient connected to nasal cannula oxygen  Post-op Assessment: Report given to RN and Post -op Vital signs reviewed and stable  Post vital signs: Reviewed and stable  Last Vitals:  Vitals:   09/03/16 0716  BP: 134/81  Pulse: 71  Resp: 16  Temp: (!) 36 C    Last Pain:  Vitals:   09/03/16 0716  TempSrc: Tympanic  PainSc: 6          Complications: No apparent anesthesia complications

## 2016-09-03 NOTE — Anesthesia Postprocedure Evaluation (Signed)
Anesthesia Post Note  Patient: Anthony Fuller  Procedure(s) Performed: Procedure(s) (LRB): ESOPHAGOGASTRODUODENOSCOPY (EGD) WITH PROPOFOL (N/A)  Patient location during evaluation: Endoscopy Anesthesia Type: General Level of consciousness: awake and alert Pain management: pain level controlled Vital Signs Assessment: post-procedure vital signs reviewed and stable Respiratory status: spontaneous breathing, nonlabored ventilation, respiratory function stable and patient connected to nasal cannula oxygen Cardiovascular status: blood pressure returned to baseline and stable Postop Assessment: no signs of nausea or vomiting Anesthetic complications: no     Last Vitals:  Vitals:   09/03/16 0910 09/03/16 0920  BP: 124/76 110/82  Pulse: 75 64  Resp: 18 13  Temp:      Last Pain:  Vitals:   09/03/16 0840  TempSrc: Tympanic  PainSc:                  Martha Clan

## 2016-09-03 NOTE — Anesthesia Preprocedure Evaluation (Signed)
Anesthesia Evaluation  Patient identified by MRN, date of birth, ID band Patient awake    Reviewed: Allergy & Precautions, H&P , NPO status , Patient's Chart, lab work & pertinent test results, reviewed documented beta blocker date and time   History of Anesthesia Complications Negative for: history of anesthetic complications  Airway Mallampati: III  TM Distance: >3 FB Neck ROM: full    Dental  (+) Teeth Intact, Poor Dentition, Missing   Pulmonary neg pulmonary ROS,           Cardiovascular hypertension, (-) angina(-) CAD, (-) Past MI, (-) Cardiac Stents and (-) CABG (-) dysrhythmias (-) Valvular Problems/Murmurs     Neuro/Psych neg Seizures  Neuromuscular disease negative psych ROS   GI/Hepatic GERD  ,NAFLD   Endo/Other  diabetes  Renal/GU Renal disease  negative genitourinary   Musculoskeletal   Abdominal   Peds  Hematology negative hematology ROS (+)   Anesthesia Other Findings Past Medical History:   Arthritis                                                    GERD (gastroesophageal reflux disease)                       Hypertension                                                 Diabetes mellitus without complication ([place])                 Cancer ([place])                                    2012           Comment:R kidney removed   Renal insufficiency                                            Comment:Right Nephrectomy due to [place].   Anxiety                                                      Clostridium difficile infection                 02/04/2014    Fatty liver disease, nonalcoholic               05/22/6220     Hepatic cirrhosis ([place])                         09/12/2014     Diabetes mellitus ([place])                         07/24/2011    DM (diabetes mellitus) type 2, uncontrolled, w* 05/30/2015    Lumbar radiculopathy  09/05/2011    Thoracic and lumbosacral neuritis               07/24/2011     Bulge of lumbar disc without myelopathy         09/05/2011    Degeneration of intervertebral disc of lumbar * 08/23/2011    Enthesopathy of hip                             04/11/2015   Acid indigestion                                02/14/2012  Past Surgical History:   SPINE SURGERY                                                   Comment:lumbar 2013   kidney removed                                  Right                Comment:2012   BACK SURGERY                                                  NEPHRECTOMY                                     Right 04/2011      URETEROSCOPY WITH HOLMIUM LASER LITHOTRIPSY     Left 05/18/2015       Comment:Procedure: URETEROSCOPY WITH HOLMIUM LASER               LITHOTRIPSY;  Surgeon: Nickie Retort, MD;               Location: [facility];  Service: Urology;                Laterality: Left;   CYSTOSCOPY W/ RETROGRADES                       Left 05/18/2015       Comment:Procedure: CYSTOSCOPY WITH RETROGRADE               PYELOGRAM;  Surgeon: Nickie Retort, MD;                Location: [facility];  Service: Urology;                Laterality: Left;   CYSTOSCOPY WITH STENT PLACEMENT                 Left 05/18/2015       Comment:Procedure: CYSTOSCOPY WITH STENT PLACEMENT;                Surgeon: Nickie Retort, MD;  Location:               [facility];  Service: Urology;  Laterality: Left;   CARDIAC CATHETERIZATION  Right 06/20/2015       Comment:Procedure: Left Heart Cath and Coronary               Angiography;  Surgeon: Dionisio David, MD;                Location: [facility];  Service:               Cardiovascular;  Laterality: Right; BMI    Body Mass Index   33.36 kg/m 2     Reproductive/Obstetrics negative OB ROS                             Anesthesia Physical  Anesthesia Plan  ASA: III  Anesthesia Plan: General   Post-op Pain Management:    Induction: Cricoid pressure planned and  Intravenous  Airway Management Planned:   Additional Equipment:   Intra-op Plan:   Post-operative Plan:   Informed Consent: I have reviewed the patients History and Physical, chart, labs and discussed the procedure including the risks, benefits and alternatives for the proposed anesthesia with the patient or authorized representative who has indicated his/her understanding and acceptance.   Dental Advisory Given  Plan Discussed with: CRNA  Anesthesia Plan Comments:         Anesthesia Quick Evaluation

## 2016-09-03 NOTE — H&P (Signed)
Primary Care Physician:  Valera Castle, MD Primary Gastroenterologist:  Dr. Vira Agar  Pre-Procedure History & Physical: HPI:  Anthony Fuller is a 56 y.o. male is here for an endoscopy.   Past Medical History:  Diagnosis Date  . Acid indigestion 02/14/2012  . Anxiety   . Arthritis    BIL.KNEES  . 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 Cancer ([place]) 2012   R kidney removed  . Cervicalgia   . Chest wall pain   . Clostridium difficile infection 02/04/2014  . DDD (degenerative disc disease), lumbar   . Degeneration of intervertebral disc of lumbar region 08/23/2011  . Diabetes mellitus ([place]) 07/24/2011  . Diabetes mellitus without complication ([place])   . Diabetic polyneuropathy associated with type 2 diabetes mellitus ([place])   . Diarrhea   . DM (diabetes mellitus) type 2, uncontrolled, with ketoacidosis ([place]) 05/30/2015  . Enthesopathy of hip 04/11/2015  . Fatty liver disease, nonalcoholic 05/19/1759  . GERD (gastroesophageal reflux disease)   . Hepatic cirrhosis ([place]) 09/12/2014  . Hydrocele   . Hypertension   . Lumbar herniated disc   . Lumbar radiculopathy 09/05/2011  . Peroneal tendon injury, right, initial encounter   . Radiculopathy   . 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  . Stenosis of cervical spine   . Thoracic and lumbosacral neuritis 07/24/2011  . Thrombocytopathia [facility])     Past Surgical History:  Procedure Laterality Date  . BACK SURGERY    . CARDIAC CATHETERIZATION Right 06/20/2015   Procedure: Left Heart Cath and Coronary Angiography;  Surgeon: Dionisio David, MD;  Location: [place] [facility];  Service: Cardiovascular;  Laterality: Right;  . CYSTOSCOPY W/ RETROGRADES Left 05/18/2015   Procedure: CYSTOSCOPY WITH RETROGRADE PYELOGRAM;  Surgeon: Nickie Retort, MD;  Location: [facility];  Service: Urology;  Laterality: Left;  . CYSTOSCOPY WITH STENT PLACEMENT Left 05/18/2015   Procedure: CYSTOSCOPY WITH STENT PLACEMENT;  Surgeon:  Nickie Retort, MD;  Location: [facility];  Service: Urology;  Laterality: Left;  . CYSTOSCOPY WITH STENT PLACEMENT Left 06/28/2015   Procedure: CYSTOSCOPY WITH STENT PLACEMENT/ EXCHANGE;  Surgeon: Hollice Espy, MD;  Location: [facility];  Service: Urology;  Laterality: Left;  . kidney removed Right    2012  . NEPHRECTOMY Right 04/2011  . SPINE SURGERY     lumbar 2013  . URETEROSCOPY WITH HOLMIUM LASER LITHOTRIPSY Left 05/18/2015   Procedure: URETEROSCOPY WITH HOLMIUM LASER LITHOTRIPSY;  Surgeon: Nickie Retort, MD;  Location: [facility];  Service: Urology;  Laterality: Left;  . URETEROSCOPY WITH HOLMIUM LASER LITHOTRIPSY Left 06/28/2015   Procedure: URETEROSCOPY WITH HOLMIUM LASER LITHOTRIPSY;  Surgeon: Hollice Espy, MD;  Location: [facility];  Service: Urology;  Laterality: Left;    Prior to Admission medications   Medication Sig Start Date End Date Taking? Authorizing Provider  acetaminophen (TYLENOL) 325 MG tablet Take 650 mg by mouth. 07/20/16  Yes Historical Provider, MD  cetirizine (ZYRTEC) 10 MG tablet Take 10 mg by mouth.   Yes Historical Provider, MD  dicyclomine (BENTYL) 10 MG capsule Take 10 mg by mouth 3 (three) times daily as needed for spasms.    Yes Historical Provider, MD  DULoxetine (CYMBALTA) 30 MG capsule Take 30 mg by mouth daily.   Yes Historical Provider, MD  empagliflozin (JARDIANCE) 10 MG TABS tablet Take 10 mg by mouth every morning.   Yes Historical Provider, MD  insulin glargine (LANTUS) 100 UNIT/ML injection Inject 100 Units into  the skin at bedtime.   Yes Historical Provider, MD  Liraglutide (VICTOZA [place]) Inject 0.6 mg into the skin.   Yes Historical Provider, MD  LYRICA 50 MG capsule Take 50 mg by mouth every morning. 06/07/15  Yes Historical Provider, MD  metFORMIN (GLUCOPHAGE-XR) 500 MG 24 hr tablet TAKE 2 TABLETS (1,000 MG TOTAL) BY MOUTH DAILY WITH DINNER. 12/05/15  Yes Historical Provider, MD  nitroGLYCERIN (NITROSTAT) 0.3 MG SL tablet Place 0.3 mg under the  tongue. 07/20/16 07/20/17 Yes Historical Provider, MD  omeprazole (PRILOSEC) 40 MG capsule Take 40 mg by mouth at bedtime.    Yes Historical Provider, MD  ondansetron (ZOFRAN-ODT) 8 MG disintegrating tablet Take 8 mg by mouth every 8 (eight) hours as needed for nausea or vomiting.   Yes Historical Provider, MD  pregabalin (LYRICA) 150 MG capsule Take 1 capsule (150 mg total) by mouth 2 (two) times daily. 06/21/15  Yes Loletha Grayer, MD  ranitidine (ZANTAC) 150 MG capsule Take 150 mg by mouth 2 (two) times daily.   Yes Historical Provider, MD  tamsulosin (FLOMAX) 0.4 MG CAPS capsule Take 0.4 mg by mouth at bedtime.    Yes Historical Provider, MD  tiZANidine (ZANAFLEX) 4 MG capsule Take 4 mg by mouth 3 (three) times daily.   Yes Historical Provider, MD  traMADol (ULTRAM) 50 MG tablet Take 50 mg by mouth every 6 (six) hours as needed for moderate pain. for pain   Yes Historical Provider, MD  atorvastatin (LIPITOR) 40 MG tablet Take 40 mg by mouth. 07/20/16 08/19/16  Historical Provider, MD  citalopram (CELEXA) 20 MG tablet Take 20 mg by mouth at bedtime. Reported on 06/28/2015    Historical Provider, MD  doxycycline (VIBRAMYCIN) 100 MG capsule Take 1 capsule (100 mg total) by mouth 2 (two) times daily. Patient not taking: Reported on 07/25/2016 12/17/15   Lattie Corns, PA-C  indomethacin (INDOCIN) 50 MG capsule Take 1 capsule (50 mg total) by mouth 3 (three) times daily with meals. Patient not taking: Reported on 07/25/2016 12/17/15   Lattie Corns, PA-C  loratadine (CLARITIN) 10 MG tablet Take 10 mg by mouth daily as needed for allergies. Reported on 06/27/2015    Historical Provider, MD  Multiple Vitamin (MULTIVITAMIN) tablet Take 1 tablet by mouth daily.    Historical Provider, MD  naproxen (NAPROSYN) 500 MG tablet Take 1 tablet (500 mg total) by mouth 2 (two) times daily with a meal. Patient not taking: Reported on 07/25/2016 12/11/15   Lorin Picket, PA-C  oxybutynin (DITROPAN) 5 MG tablet Take 1  tablet (5 mg total) by mouth every 8 (eight) hours as needed for bladder spasms. Patient not taking: Reported on 07/25/2016 06/28/15   Hollice Espy, MD  oxycodone (OXY-IR) 5 MG capsule Take 5 mg by mouth every 4 (four) hours as needed.    Historical Provider, MD  terbinafine (LAMISIL) 250 MG tablet TAKE 1 TABLET (250 MG TOTAL) BY MOUTH ONCE DAILY. 06/10/15   Historical Provider, MD    Allergies as of 08/30/2016 - Review Complete 07/25/2016  Allergen Reaction Noted  . Hydromorphone Other (See Comments) 09/20/2014  . Aspirin  02/27/2016  . Floxin [ofloxacin] Rash 09/20/2014    Family History  Problem Relation Age of Onset  . Diabetes Mother   . Hypertension Mother   . Asthma Mother   . Heart disease Father   . Kidney disease Father   . Diabetes Father   . Stroke Father     Social History  Social History  . Marital status: Married    Spouse name: N/A  . Number of children: N/A  . Years of education: N/A   Occupational History  . Not on file.   Social History Main Topics  . Smoking status: Never Smoker  . Smokeless tobacco: Never Used  . Alcohol use No  . Drug use: No  . Sexual activity: Yes   Other Topics Concern  . Not on file   Social History Narrative  . No narrative on file    Review of Systems: See HPI, otherwise negative ROS  Physical Exam: BP 134/81   Pulse 71   Temp (!) 96.8 F (36 C) (Tympanic)   Resp 16   Ht 6\' 2"  (1.88 m)   Wt 122.5 kg (270 lb)   SpO2 100%   BMI 34.67 kg/m  General:   Alert,  pleasant and cooperative in NAD Head:  Normocephalic and atraumatic. Neck:  Supple; no masses or thyromegaly. Lungs:  Clear throughout to auscultation.    Heart:  Regular rate and rhythm. Abdomen:  Soft, nontender and nondistended. Normal bowel sounds, without guarding, and without rebound.   Neurologic:  Alert and  oriented x4;  grossly normal neurologically.  Impression/Plan: Anthony Fuller is here for an endoscopy to be performed for dysphagia,  GERD, screen for varices due to cirrhosis.  Risks, benefits, limitations, and alternatives regarding  endoscopy have been reviewed with the patient.  Questions have been answered.  All parties agreeable.   Gaylyn Cheers, MD  09/03/2016, 8:35 AM

## 2016-09-04 ENCOUNTER — Ambulatory Visit
Admission: RE | Admit: 2016-09-04 | Discharge: 2016-09-04 | Disposition: A | Discharge status: Home / Self Care | Payer: 59 | Source: Ambulatory Visit | Attending: Student | Admitting: Student

## 2016-09-04 ENCOUNTER — Encounter: Payer: Self-pay | Admitting: Unknown Physician Specialty

## 2016-09-04 DIAGNOSIS — K820 Obstruction of gallbladder: Secondary | ICD-10-CM | POA: Insufficient documentation

## 2016-09-04 DIAGNOSIS — K7469 Other cirrhosis of liver: Secondary | ICD-10-CM | POA: Diagnosis not present

## 2016-09-04 DIAGNOSIS — N2881 Hypertrophy of kidney: Secondary | ICD-10-CM | POA: Diagnosis not present

## 2016-09-04 DIAGNOSIS — Z905 Acquired absence of kidney: Secondary | ICD-10-CM | POA: Insufficient documentation

## 2016-10-22 ENCOUNTER — Other Ambulatory Visit: Payer: Self-pay | Admitting: Student

## 2016-10-22 DIAGNOSIS — K30 Functional dyspepsia: Secondary | ICD-10-CM

## 2016-10-22 DIAGNOSIS — K3184 Gastroparesis: Secondary | ICD-10-CM

## 2016-10-22 DIAGNOSIS — E1143 Type 2 diabetes mellitus with diabetic autonomic (poly)neuropathy: Secondary | ICD-10-CM

## 2016-10-22 DIAGNOSIS — R1084 Generalized abdominal pain: Secondary | ICD-10-CM

## 2016-10-25 ENCOUNTER — Ambulatory Visit
Admission: RE | Admit: 2016-10-25 | Discharge: 2016-10-25 | Disposition: A | Discharge status: Home / Self Care | Payer: 59 | Source: Ambulatory Visit | Attending: Student | Admitting: Student

## 2016-10-25 DIAGNOSIS — K219 Gastro-esophageal reflux disease without esophagitis: Secondary | ICD-10-CM | POA: Diagnosis not present

## 2016-10-25 DIAGNOSIS — K3184 Gastroparesis: Secondary | ICD-10-CM | POA: Diagnosis not present

## 2016-10-25 DIAGNOSIS — K30 Functional dyspepsia: Secondary | ICD-10-CM | POA: Diagnosis present

## 2016-10-25 DIAGNOSIS — R1084 Generalized abdominal pain: Secondary | ICD-10-CM | POA: Insufficient documentation

## 2016-10-25 DIAGNOSIS — E1143 Type 2 diabetes mellitus with diabetic autonomic (poly)neuropathy: Secondary | ICD-10-CM | POA: Insufficient documentation

## 2016-10-29 ENCOUNTER — Ambulatory Visit: Payer: 59

## 2016-12-31 ENCOUNTER — Ambulatory Visit
Admission: EM | Admit: 2016-12-31 | Discharge: 2016-12-31 | Disposition: A | Discharge status: Home / Self Care | Payer: 59 | Attending: Family Medicine | Admitting: Family Medicine

## 2016-12-31 ENCOUNTER — Ambulatory Visit (INDEPENDENT_AMBULATORY_CARE_PROVIDER_SITE_OTHER): Payer: 59

## 2016-12-31 DIAGNOSIS — M940 Chondrocostal junction syndrome [Tietze]: Secondary | ICD-10-CM | POA: Diagnosis not present

## 2016-12-31 MED ORDER — CYCLOBENZAPRINE HCL 10 MG PO TABS: 10.0000 mg | ORAL_TABLET | Freq: Every day | ORAL | 0 refills | Status: DC

## 2016-12-31 MED ORDER — OXYCODONE-ACETAMINOPHEN 5-325 MG PO TABS: ORAL_TABLET | ORAL | 0 refills | Status: DC

## 2016-12-31 NOTE — ED Triage Notes (Signed)
Pt said he has been having back pain for the past 2-3 days and said it's getting worse as the day goes on. Said he didn't hurt it but does have back problems. His wife stated it's swollen on the right side. Michela Pitcher it feels like a muscle spasms. Has been taking a muscle relaxer and tramadol without relief. Didn't try heat. Pt reports no urinary symptoms.

## 2016-12-31 NOTE — ED Provider Notes (Signed)
[facility]    CSN: [id number] Arrival date & time: 12/31/16  1741     History   Chief Complaint Chief Complaint  Patient presents with  . Back Pain    HPI Anthony Fuller is a 56 y.o. male.   56 yo male with a c/o right sided intermittent back pain for 3 days. Pain is worse with certain movements and feels like "a deep stabbing pain".  Denies any acute injuries, however states has a h/o back problems. Denies any fevers, chills, chest pains, shortness of breath, rash. Has tried Zanaflex and tramadol without relief.   The history is provided by the patient.  Back Pain    Past Medical History:  Diagnosis Date  . Acid indigestion 02/14/2012  . Anxiety   . Arthritis    BIL.KNEES  . 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 Cancer ([employer]) 2012   R kidney removed  . Cervicalgia   . Chest wall pain   . Clostridium difficile infection 02/04/2014  . DDD (degenerative disc disease), lumbar   . Degeneration of intervertebral disc of lumbar region 08/23/2011  . Diabetes mellitus ([place]) 07/24/2011  . Diabetes mellitus without complication ([place])   . Diabetic polyneuropathy associated with type 2 diabetes mellitus ([place])   . Diarrhea   . DM (diabetes mellitus) type 2, uncontrolled, with ketoacidosis ([place]) 05/30/2015  . Enthesopathy of hip 04/11/2015  . Fatty liver disease, nonalcoholic 05/15/9415  . GERD (gastroesophageal reflux disease)   . Hepatic cirrhosis ([place]) 09/12/2014  . Hydrocele   . Hypertension   . Lumbar herniated disc   . Lumbar radiculopathy 09/05/2011  . Peroneal tendon injury, right, initial encounter   . Radiculopathy   . 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  . Stenosis of cervical spine   . Thoracic and lumbosacral neuritis 07/24/2011  . Thrombocytopathia [facility]     Patient Active Problem List   Diagnosis Date Noted  . Cervical spinal stenosis 06/24/2015  . Sepsis ([place]) 06/16/2015  . Left sided abdominal pain 05/30/2015  .  Chest pain 05/30/2015  . CKD (chronic kidney disease), 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  . Hyponatremia 05/30/2015  . Leukocytosis 05/30/2015  . DM (diabetes mellitus) type 2, uncontrolled, with ketoacidosis ([place]) 05/30/2015  . Solitary kidney 05/30/2015  . Acute pyelonephritis 05/27/2015  . Pain due to ureteral stent ([place]) 05/27/2015  . Enthesopathy of hip 04/11/2015  . Hepatic cirrhosis ([place]) 09/12/2014  . Fatty liver disease, nonalcoholic 40/81/4481  . Cervical pain 08/06/2014  . Clostridium difficile infection 02/04/2014  . D (diarrhea) 12/17/2013  . Arthralgia of hip 08/26/2013  . H/O urinary disorder 06/29/2013  . Acid indigestion 02/14/2012  . LBP (low back pain) 12/18/2011  . 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 Lumbar radiculopathy 09/05/2011  . Degeneration of intervertebral disc of lumbar region 08/23/2011  . Lumbar canal stenosis 08/23/2011  . 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 Thoracic and lumbosacral neuritis 07/24/2011  . Diabetes mellitus ([place]) 07/24/2011    Past Surgical History:  Procedure Laterality Date  . BACK SURGERY    . CARDIAC CATHETERIZATION Right 06/20/2015   Procedure: Left Heart Cath and Coronary Angiography;  Surgeon: Dionisio David, MD;  Location: [place] [facility];  Service: Cardiovascular;  Laterality: Right;  . CYSTOSCOPY W/ RETROGRADES Left 05/18/2015   Procedure: CYSTOSCOPY WITH RETROGRADE PYELOGRAM;  Surgeon: Nickie Retort, MD;  Location: [facility];  Service: Urology;  Laterality: Left;  .  CYSTOSCOPY WITH STENT PLACEMENT Left 05/18/2015   Procedure: CYSTOSCOPY WITH STENT PLACEMENT;  Surgeon: Nickie Retort, MD;  Location: [facility];  Service: Urology;  Laterality: Left;  . CYSTOSCOPY WITH STENT PLACEMENT Left 06/28/2015   Procedure: CYSTOSCOPY WITH STENT PLACEMENT/ EXCHANGE;  Surgeon: Hollice Espy, MD;  Location: [facility];  Service: Urology;  Laterality: Left;  . ESOPHAGOGASTRODUODENOSCOPY (EGD) WITH PROPOFOL N/A  09/03/2016   Procedure: ESOPHAGOGASTRODUODENOSCOPY (EGD) WITH PROPOFOL;  Surgeon: Manya Silvas, MD;  [facility];  Service: Endoscopy;  Laterality: N/A;  . kidney removed Right    2012  . NEPHRECTOMY Right 04/2011  . SPINE SURGERY     lumbar 2013  . URETEROSCOPY WITH HOLMIUM LASER LITHOTRIPSY Left 05/18/2015   Procedure: URETEROSCOPY WITH HOLMIUM LASER LITHOTRIPSY;  Surgeon: Nickie Retort, MD;  Location: [facility];  Service: Urology;  Laterality: Left;  . URETEROSCOPY WITH HOLMIUM LASER LITHOTRIPSY Left 06/28/2015   Procedure: URETEROSCOPY WITH HOLMIUM LASER LITHOTRIPSY;  Surgeon: Hollice Espy, MD;  Location: [facility];  Service: Urology;  Laterality: Left;       Home Medications    Prior to Admission medications   Medication Sig Start Date End Date Taking? Authorizing Provider  acetaminophen (TYLENOL) 325 MG tablet Take 650 mg by mouth. 07/20/16  Yes [provider]  cetirizine (ZYRTEC) 10 MG tablet Take 10 mg by mouth.   Yes [provider]  citalopram (CELEXA) 20 MG tablet Take 20 mg by mouth at bedtime. Reported on 06/28/2015   Yes [provider]  dicyclomine (BENTYL) 10 MG capsule Take 10 mg by mouth 3 (three) times daily as needed for spasms.    Yes [provider]  doxycycline (VIBRAMYCIN) 100 MG capsule Take 1 capsule (100 mg total) by mouth 2 (two) times daily. 12/17/15  Yes Lattie Corns, PA-C  DULoxetine (CYMBALTA) 30 MG capsule Take 30 mg by mouth daily.   Yes [provider]  empagliflozin (JARDIANCE) 10 MG TABS tablet Take 10 mg by mouth every morning.   Yes [provider]  indomethacin (INDOCIN) 50 MG capsule Take 1 capsule (50 mg total) by mouth 3 (three) times daily with meals. 12/17/15  Yes Lattie Corns, PA-C  insulin glargine (LANTUS) 100 UNIT/ML injection Inject 100 Units into the skin at bedtime.   Yes [provider]  Liraglutide (VICTOZA [place]) Inject 0.6 mg into the skin.   Yes  [provider]  loratadine (CLARITIN) 10 MG tablet Take 10 mg by mouth daily as needed for allergies. Reported on 06/27/2015   Yes [provider]  LYRICA 50 MG capsule Take 50 mg by mouth every morning. 06/07/15  Yes [provider]  metFORMIN (GLUCOPHAGE-XR) 500 MG 24 hr tablet TAKE 2 TABLETS (1,000 MG TOTAL) BY MOUTH DAILY WITH DINNER. 12/05/15  Yes [provider]  Multiple Vitamin (MULTIVITAMIN) tablet Take 1 tablet by mouth daily.   Yes [provider]  naproxen (NAPROSYN) 500 MG tablet Take 1 tablet (500 mg total) by mouth 2 (two) times daily with a meal. 12/11/15  Yes Lorin Picket, PA-C  nitroGLYCERIN (NITROSTAT) 0.3 MG SL tablet Place 0.3 mg under the tongue. 07/20/16 07/20/17 Yes [provider]  omeprazole (PRILOSEC) 40 MG capsule Take 40 mg by mouth at bedtime.    Yes [provider]  ondansetron (ZOFRAN-ODT) 8 MG disintegrating tablet Take 8 mg by mouth every 8 (eight) hours as needed for nausea or vomiting.   Yes [provider]  oxybutynin (DITROPAN)  5 MG tablet Take 1 tablet (5 mg total) by mouth every 8 (eight) hours as needed for bladder spasms. 06/28/15  Yes Hollice Espy, MD  oxycodone (OXY-IR) 5 MG capsule Take 5 mg by mouth every 4 (four) hours as needed.   Yes [provider]  pregabalin (LYRICA) 150 MG capsule Take 1 capsule (150 mg total) by mouth 2 (two) times daily. 06/21/15  Yes Wieting, Richard, MD  ranitidine (ZANTAC) 150 MG capsule Take 150 mg by mouth 2 (two) times daily.   Yes [provider]  tamsulosin (FLOMAX) 0.4 MG CAPS capsule Take 0.4 mg by mouth at bedtime.    Yes [provider]  terbinafine (LAMISIL) 250 MG tablet TAKE 1 TABLET (250 MG TOTAL) BY MOUTH ONCE DAILY. 06/10/15  Yes [provider]  tiZANidine (ZANAFLEX) 4 MG capsule Take 4 mg by mouth 3 (three) times daily.   Yes [provider]  traMADol (ULTRAM) 50 MG tablet Take 50 mg by mouth every  6 (six) hours as needed for moderate pain. for pain   Yes [provider]  atorvastatin (LIPITOR) 40 MG tablet Take 40 mg by mouth. 07/20/16 08/19/16  [provider]  cyclobenzaprine (FLEXERIL) 10 MG tablet Take 1 tablet (10 mg total) by mouth at bedtime. 12/31/16   Norval Gable, MD  oxyCODONE-acetaminophen (PERCOCET/ROXICET) 5-325 MG tablet 1-2 tabs po qd prn 12/31/16   Norval Gable, MD    Family History Family History  Problem Relation Age of Onset  . Diabetes Mother   . Hypertension Mother   . Asthma Mother   . Heart disease Father   . Kidney disease Father   . Diabetes Father   . Stroke Father     Social History Social History  Substance Use Topics  . Smoking status: Never Smoker  . Smokeless tobacco: Never Used  . Alcohol use No     Allergies   Hydromorphone; Aspirin; and Floxin [ofloxacin]   Review of Systems Review of Systems  Musculoskeletal: Positive for back pain.     Physical Exam Triage Vital Signs ED Triage Vitals  Enc Vitals Group     BP 12/31/16 1752 (!) 141/79     Pulse Rate 12/31/16 1752 74     Resp 12/31/16 1752 18     Temp 12/31/16 1752 98.6 F (37 C)     Temp Source 12/31/16 1752 Oral     SpO2 12/31/16 1752 99 %     Weight --      Height --      Head Circumference --      Peak Flow --      Pain Score 12/31/16 1756 6     Pain Loc --      Pain Edu? --      Excl. in [place]? --    No data found.   Updated Vital Signs BP (!) 141/79 (BP Location: Left Arm)   Pulse 74   Temp 98.6 F (37 C) (Oral)   Resp 18   SpO2 99%   Visual Acuity Right Eye Distance:   Left Eye Distance:   Bilateral Distance:    Right Eye Near:   Left Eye Near:    Bilateral Near:     Physical Exam  Constitutional: He appears well-developed and well-nourished. No distress.  HENT:  Head: Normocephalic and atraumatic.  Right Ear: Tympanic membrane, external ear and ear canal normal.  Left Ear: Tympanic membrane, external ear and ear canal  normal.  Nose: Nose normal.  Mouth/Throat: Uvula is midline, oropharynx is clear and moist and mucous membranes are normal. No oropharyngeal exudate or tonsillar abscesses.  Eyes: Pupils are equal, round, and reactive to light. Conjunctivae and EOM are normal. Right eye exhibits no discharge. Left eye exhibits no discharge. No scleral icterus.  Neck: Normal range of motion. Neck supple. No tracheal deviation present. No thyromegaly present.  Cardiovascular: Normal rate, regular rhythm and normal heart sounds.   Pulmonary/Chest: Effort normal and breath sounds normal. No stridor. No respiratory distress. He has no wheezes. He has no rales. He exhibits tenderness (right lower lateral ribs).  Musculoskeletal:  Tenderness to palpation over the right lower posterior ribs  Lymphadenopathy:    He has no cervical adenopathy.  Neurological: He is alert.  Skin: Skin is warm and dry. No rash noted. He is not diaphoretic.  Nursing note and vitals reviewed.    UC Treatments / Results  Labs (all labs ordered are listed, but only abnormal results are displayed) Labs Reviewed - No data to display  EKG  EKG Interpretation None       Radiology Dg Ribs Unilateral W/chest Right  Result Date: 12/31/2016 CLINICAL DATA:  Posterolateral right rib pain. EXAM: RIGHT RIBS AND CHEST - 3+ VIEW COMPARISON:  Left rib detail films from 06/19/2015 FINDINGS: The lungs are clear without focal pneumonia, edema, pneumothorax or pleural effusion. The cardiopericardial silhouette is within normal limits for size. Degenerative changes noted right AC joint. Radio-opaque marker has been placed on the skin at the site of patient concern. Oblique views of the right ribs show no evidence for an acute displaced right-sided rib fracture. IMPRESSION: Negative. Electronically Signed   By: Misty Stanley M.D.   On: 12/31/2016 19:16    Procedures Procedures (including critical care time)  Medications Ordered in UC Medications -  No data to display   Initial Impression / Assessment and Plan / UC Course  I have reviewed the triage vital signs and the nursing notes.  Pertinent labs & imaging results that were available during my care of the patient were reviewed by me and considered in my medical decision making (see chart for details).       Final Clinical Impressions(s) / UC Diagnoses   Final diagnoses:  Costochondritis    New Prescriptions Discharge Medication List as of 12/31/2016  7:39 PM    START taking these medications   Details  cyclobenzaprine (FLEXERIL) 10 MG tablet Take 1 tablet (10 mg total) by mouth at bedtime., Starting Mon 12/31/2016, Normal    oxyCODONE-acetaminophen (PERCOCET/ROXICET) 5-325 MG tablet 1-2 tabs po qd prn, Print      1. x-ray results and diagnosis reviewed with patient 2. rx as per orders above; reviewed possible side effects, interactions, risks and benefits  3. Recommend supportive treatment with rest, ice 4. Follow-up prn if symptoms worsen or don't improve  Controlled Substance Prescriptions [place] Controlled Substance Registry consulted? Yes, I have consulted the [place] Controlled Substances Registry for this patient, and feel the risk/benefit ratio today is favorable for proceeding with this prescription for a controlled substance.   Norval Gable, MD 12/31/16 2124

## 2017-01-22 NOTE — Progress Notes (Deleted)
01/23/2017 8:32 AM   Anthony Fuller 08-Nov-1960 [id number]  Referring provider: Valera Castle, [place], [place] [address]  No chief complaint on file.   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  He presented to [facility] ED 3/8 with left flank pain. CT report with several LLP stones, largest 4 mm. No UA. Bun  21, cr 0.96.    He has a h/o stones. He was taken in 2017 for staged left URS to clear stones (up to a 10 mm LLP).   ***Today, pt is seen for the above. UA is clear. He's had no stone passage. He still has occasional flank pain and pain in the left groin. Discomfort is intermittent. A nagging, dull ache. Nothing seems to make it better or worse. No associated signs or symptoms. He voids with a good flow, but has some PV dribble. Also, he has some urgency. PSA was 0.20 July 2016.   RUS performed on 07/25/2016 noted surgical absence of RIGHT kidney.  Minimal collecting system dilatation in LEFT kidney though a LEFT ureteral jet is visualized at the bladder.  Questionable nonobstructing calculi within LEFT kidney as above; consider follow-up noncontrast CT assessment.  BPH WITH LUTS  (prostate and/or bladder) His IPSS score today is ***, which is *** lower urinary tract symptomatology. He is *** with his quality life due to his urinary symptoms. His PVR is *** mL.  His previous IPSS score was ***.  His previous PVR is *** mL.    His major complaint today ***.  He has had these symptoms for *** years.  He denies any dysuria, hematuria or suprapubic pain.   He currently taking ***.  His has had ***.  Previous PSA's:     He also denies any recent fevers, chills, nausea or vomiting.  He has a family history of PCa, with ***.   He does not have a family history of PCa.***    Score:  1-7 Mild 8-19 Moderate 20-35 Severe   PMH: Past Medical History:    Diagnosis Date  . Acid indigestion 02/14/2012  . Anxiety   . Arthritis    BIL.KNEES  . 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 Cancer ([place]) 2012   R kidney removed  . Cervicalgia   . Chest wall pain   . Clostridium difficile infection 02/04/2014  . DDD (degenerative disc disease), lumbar   . Degeneration of intervertebral disc of lumbar region 08/23/2011  . Diabetes mellitus ([place]) 07/24/2011  . Diabetes mellitus without complication ([place])   . Diabetic polyneuropathy associated with type 2 diabetes mellitus ([place])   . Diarrhea   . DM (diabetes mellitus) type 2, uncontrolled, with ketoacidosis ([place]) 05/30/2015  . Enthesopathy of hip 04/11/2015  . Fatty liver disease, nonalcoholic 09/14/6142  . GERD (gastroesophageal reflux disease)   . Hepatic cirrhosis ([place]) 09/12/2014  . Hydrocele   . Hypertension   . Lumbar herniated disc   . Lumbar radiculopathy 09/05/2011  . Peroneal tendon injury, right, initial encounter   . Radiculopathy   . 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  . Stenosis of cervical spine   . Thoracic and lumbosacral neuritis 07/24/2011  . Thrombocytopathia [facility])     Surgical History: Past Surgical History:  Procedure Laterality Date  . BACK SURGERY    . CARDIAC CATHETERIZATION Right 06/20/2015  Procedure: Left Heart Cath and Coronary Angiography;  Surgeon: Dionisio David, MD;  Location: [place] [facility];  Service: Cardiovascular;  Laterality: Right;  . CYSTOSCOPY W/ RETROGRADES Left 05/18/2015   Procedure: CYSTOSCOPY WITH RETROGRADE PYELOGRAM;  Surgeon: Nickie Retort, MD;  Location: [facility] ORS;  Service: Urology;  Laterality: Left;  . CYSTOSCOPY WITH STENT PLACEMENT Left 05/18/2015   Procedure: CYSTOSCOPY WITH STENT PLACEMENT;  Surgeon: Nickie Retort, MD;  Location: [facility] ORS;  Service: Urology;  Laterality: Left;  . CYSTOSCOPY WITH STENT PLACEMENT Left 06/28/2015   Procedure: CYSTOSCOPY WITH STENT PLACEMENT/ EXCHANGE;  Surgeon: Hollice Espy, MD;  Location: [facility] ORS;  Service: Urology;  Laterality: Left;  . ESOPHAGOGASTRODUODENOSCOPY (EGD) WITH PROPOFOL N/A 09/03/2016   Procedure: ESOPHAGOGASTRODUODENOSCOPY (EGD) WITH PROPOFOL;  Surgeon: Manya Silvas, MD;  Location: [facility] END[facility]COPY;  Service: Endoscopy;  Laterality: N/A;  . kidney removed Right    2012  . NEPHRECTOMY Right 04/2011  . SPINE SURGERY     lumbar 2013  . URETEROSCOPY WITH HOLMIUM LASER LITHOTRIPSY Left 05/18/2015   Procedure: URETEROSCOPY WITH HOLMIUM LASER LITHOTRIPSY;  Surgeon: Nickie Retort, MD;  Location: [facility] ORS;  Service: Urology;  Laterality: Left;  . URETEROSCOPY WITH HOLMIUM LASER LITHOTRIPSY Left 06/28/2015   Procedure: URETEROSCOPY WITH HOLMIUM LASER LITHOTRIPSY;  Surgeon: Hollice Espy, MD;  Location: [facility] ORS;  Service: Urology;  Laterality: Left;    Home Medications:  Allergies as of 01/23/2017      Reactions   Hydromorphone Other (See Comments)   Other reaction(s): Other (See Comments) Severe hypotension Reaction:  Drops pts BP    Aspirin    Other reaction(s): Other (See Comments) Hx GI bleed   Floxin [ofloxacin] Rash      Medication List       Accurate as of 01/22/17  8:32 AM. Always use your most recent med list.          acetaminophen 325 MG tablet Commonly known as:  TYLENOL Take 650 mg by mouth.   atorvastatin 40 MG tablet Commonly known as:  LIPITOR Take 40 mg by mouth.   cetirizine 10 MG tablet Commonly known as:  ZYRTEC Take 10 mg by mouth.   citalopram 20 MG tablet Commonly known as:  CELEXA Take 20 mg by mouth at bedtime. Reported on 06/28/2015   cyclobenzaprine 10 MG tablet Commonly known as:  FLEXERIL Take 1 tablet (10 mg total) by mouth at bedtime.   dicyclomine 10 MG capsule Commonly known as:  BENTYL Take 10 mg by mouth 3 (three) times daily as needed for spasms.   doxycycline 100 MG capsule Commonly known as:  VIBRAMYCIN Take 1 capsule (100 mg total) by mouth 2 (two) times daily.     DULoxetine 30 MG capsule Commonly known as:  CYMBALTA Take 30 mg by mouth daily.   empagliflozin 10 MG Tabs tablet Commonly known as:  JARDIANCE Take 10 mg by mouth every morning.   indomethacin 50 MG capsule Commonly known as:  INDOCIN Take 1 capsule (50 mg total) by mouth 3 (three) times daily with meals.   insulin glargine 100 UNIT/ML injection Commonly known as:  LANTUS Inject 100 Units into the skin at bedtime.   loratadine 10 MG tablet Commonly known as:  CLARITIN Take 10 mg by mouth daily as needed for allergies. Reported on 06/27/2015   metFORMIN 500 MG 24 hr tablet Commonly known as:  GLUCOPHAGE-XR TAKE 2 TABLETS (1,000 MG TOTAL) BY MOUTH DAILY WITH DINNER.   multivitamin tablet  Take 1 tablet by mouth daily.   naproxen 500 MG tablet Commonly known as:  NAPROSYN Take 1 tablet (500 mg total) by mouth 2 (two) times daily with a meal.   nitroGLYCERIN 0.3 MG SL tablet Commonly known as:  NITROSTAT Place 0.3 mg under the tongue.   omeprazole 40 MG capsule Commonly known as:  PRILOSEC Take 40 mg by mouth at bedtime.   ondansetron 8 MG disintegrating tablet Commonly known as:  ZOFRAN-ODT Take 8 mg by mouth every 8 (eight) hours as needed for nausea or vomiting.   oxybutynin 5 MG tablet Commonly known as:  DITROPAN Take 1 tablet (5 mg total) by mouth every 8 (eight) hours as needed for bladder spasms.   oxycodone 5 MG capsule Commonly known as:  OXY-IR Take 5 mg by mouth every 4 (four) hours as needed.   oxyCODONE-acetaminophen 5-325 MG tablet Commonly known as:  PERCOCET/ROXICET 1-2 tabs po qd prn   LYRICA 50 MG capsule Generic drug:  pregabalin Take 50 mg by mouth every morning.   pregabalin 150 MG capsule Commonly known as:  LYRICA Take 1 capsule (150 mg total) by mouth 2 (two) times daily.   ranitidine 150 MG capsule Commonly known as:  ZANTAC Take 150 mg by mouth 2 (two) times daily.   tamsulosin 0.4 MG Caps capsule Commonly known as:   FLOMAX Take 0.4 mg by mouth at bedtime.   terbinafine 250 MG tablet Commonly known as:  LAMISIL TAKE 1 TABLET (250 MG TOTAL) BY MOUTH ONCE DAILY.   tiZANidine 4 MG capsule Commonly known as:  ZANAFLEX Take 4 mg by mouth 3 (three) times daily.   traMADol 50 MG tablet Commonly known as:  ULTRAM Take 50 mg by mouth every 6 (six) hours as needed for moderate pain. for pain   VICTOZA [place] Inject 0.6 mg into the skin.       Allergies:  Allergies  Allergen Reactions  . Hydromorphone Other (See Comments)    Other reaction(s): Other (See Comments) Severe hypotension Reaction:  Drops pts BP   . Aspirin     Other reaction(s): Other (See Comments) Hx GI bleed  . Floxin [Ofloxacin] Rash    Family History: Family History  Problem Relation Age of Onset  . Diabetes Mother   . Hypertension Mother   . Asthma Mother   . Heart disease Father   . Kidney disease Father   . Diabetes Father   . Stroke Father     Social History:  reports that he has never smoked. He has never used smokeless tobacco. He reports that he does not drink alcohol or use drugs.  ROS:                                        Physical Exam: There were no vitals taken for this visit.  Constitutional:  Alert and oriented, No acute distress. HEENT: [place] AT, moist mucus membranes.  Trachea midline, no masses. Cardiovascular: No clubbing, cyanosis, or edema. Respiratory: Normal respiratory effort, no increased work of breathing. GI: Abdomen is soft, nontender, nondistended, no abdominal masses GU: No CVA tenderness. Penis - circumcised, no mass or lesion. Testicles descended bilaterally, palpably normal, right testicle atrophy. DRE - prostate 20 grams, no hard area or nodule.  Skin: No rashes, bruises or suspicious lesions. Lymph: No cervical or inguinal adenopathy. Neurologic: Grossly intact, no focal deficits, moving all 4 extremities.  Psychiatric: Normal mood and affect.  Laboratory  Data: Results for orders placed or performed during the hospital encounter of 09/03/16  Glucose, capillary  Result Value Ref Range   Glucose-Capillary 110 (H) 65 - 99 mg/dL   Comment 1 IN EPIC    PSA History  0.9 ng/mL in 07/2016  Urinalysis *** I have reviewed the labs  Pertinent Imaging:  Assessment & Plan:   1. History of nephrolithiasis  - KUB  ***  - Urinalysis, Complete  2. Solitary kidney  - Cr 1.1 in 10/2016 baseline    No Follow-up on file.  Zara Council, [facility] [address] [place], [place] [address] [phone]

## 2017-01-23 ENCOUNTER — Ambulatory Visit: Payer: 59 | Admitting: Urology

## 2017-03-27 ENCOUNTER — Encounter: Payer: Self-pay | Admitting: *Deleted

## 2017-03-27 ENCOUNTER — Ambulatory Visit
Admission: EM | Admit: 2017-03-27 | Discharge: 2017-03-27 | Disposition: A | Discharge status: Home / Self Care | Payer: 59 | Attending: Family Medicine | Admitting: Family Medicine

## 2017-03-27 ENCOUNTER — Ambulatory Visit (INDEPENDENT_AMBULATORY_CARE_PROVIDER_SITE_OTHER): Payer: 59

## 2017-03-27 DIAGNOSIS — L03031 Cellulitis of right toe: Secondary | ICD-10-CM | POA: Diagnosis not present

## 2017-03-27 DIAGNOSIS — S91101A Unspecified open wound of right great toe without damage to nail, initial encounter: Secondary | ICD-10-CM | POA: Diagnosis not present

## 2017-03-27 DIAGNOSIS — E1140 Type 2 diabetes mellitus with diabetic neuropathy, unspecified: Secondary | ICD-10-CM | POA: Diagnosis not present

## 2017-03-27 DIAGNOSIS — Z23 Encounter for immunization: Secondary | ICD-10-CM | POA: Diagnosis not present

## 2017-03-27 MED ORDER — DOXYCYCLINE HYCLATE 100 MG PO CAPS: 100.0000 mg | ORAL_CAPSULE | Freq: Two times a day (BID) | ORAL | 0 refills | Status: DC

## 2017-03-27 MED ORDER — MUPIROCIN 2 % EX OINT: TOPICAL_OINTMENT | CUTANEOUS | 0 refills | Status: DC

## 2017-03-27 MED ORDER — TETANUS-DIPHTH-ACELL PERTUSSIS 5-2.5-18.5 LF-MCG/0.5 IM SUSP
0.5000 mL | Freq: Once | INTRAMUSCULAR | Status: AC
  Administered 2017-03-27: 0.5 mL via INTRAMUSCULAR

## 2017-03-27 MED ORDER — CEFTRIAXONE SODIUM 1 G IJ SOLR
1.0000 g | Freq: Once | INTRAMUSCULAR | Status: AC
  Administered 2017-03-27: 1 g via INTRAMUSCULAR

## 2017-03-27 NOTE — ED Triage Notes (Signed)
C/O right big toe pain for over a week. Noted some drainage , redness and a nickel size  sore to toe.

## 2017-03-27 NOTE — Discharge Instructions (Signed)
Take medication as prescribed. Rest. Drink plenty of fluids.  Elevate.  Keep clean.  Avoid shoes that rub in the same area as to prevent calluses.  It is very important to closely monitor and have very close follow-up.  Call tomorrow morning, and follow-up with [facility] wound clinic or primary as soon as possible.  Follow up with your primary care physician this week as needed. Return to Urgent care for new or worsening concerns.

## 2017-03-27 NOTE — ED Provider Notes (Addendum)
[facility] ____________________________________________  Time seen: Approximately 6:55 PM  I have reviewed the triage vital signs and the nursing notes.   HISTORY  Chief Complaint Toe Pain   HPI Anthony Fuller is a 56 y.o. male with a history of diabetes, degenerative disc disease, C. difficile, right kidney cancer and right nephrectomy presenting for evaluation of approximately 2-week gradual onset and worsening of wound to the right medial great toe.  Patient reports that this is been gradual in onset but over the last few days he noticed some drainage from the toe.  Also reports of the last few days noticing some redness as well as some pain.  Reports that he is a diabetic and he does have some chronic peripheral neuropathy but it does not always feel his toes and feet as well and thinks that this contributed to this wound.  Denies any decrease range of motion or acutely decrease of sensation.  Denies fevers, pain radiation, other redness or skin changes.  Unsure of last tetanus immunization.  Denies any trauma, injury or known reason for break his skin.  Reports his continue to remain active.  Reports continues to eat and drink well.  Denies recent antibiotic use or recent sickness.  Denies history of MRSA. Valera Castle, MD: PCP  Past Medical History:  Diagnosis Date  . Acid indigestion 02/14/2012  . Anxiety   . Arthritis    BIL.KNEES  . 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 Cancer ([place]) 2012   R kidney removed  . Cervicalgia   . Chest wall pain   . Clostridium difficile infection 02/04/2014  . DDD (degenerative disc disease), lumbar   . Degeneration of intervertebral disc of lumbar region 08/23/2011  . Diabetes mellitus ([place]) 07/24/2011  . Diabetes mellitus without complication ([place])   . Diabetic polyneuropathy associated with type 2 diabetes mellitus ([place])   . Diarrhea   . DM (diabetes mellitus) type 2, uncontrolled, with ketoacidosis ([place])  05/30/2015  . Enthesopathy of hip 04/11/2015  . Fatty liver disease, nonalcoholic 09/14/84  . GERD (gastroesophageal reflux disease)   . Hepatic cirrhosis ([place]) 09/12/2014  . Hydrocele   . Hypertension   . Lumbar herniated disc   . Lumbar radiculopathy 09/05/2011  . Peroneal tendon injury, right, initial encounter   . Radiculopathy   . 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  . Stenosis of cervical spine   . Thoracic and lumbosacral neuritis 07/24/2011  . Thrombocytopathia [facility])     Patient Active Problem List   Diagnosis Date Noted  . Cervical spinal stenosis 06/24/2015  . Sepsis ([place]) 06/16/2015  . Left sided abdominal pain 05/30/2015  . Chest pain 05/30/2015  . CKD (chronic kidney disease), 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  . Hyponatremia 05/30/2015  . Leukocytosis 05/30/2015  . DM (diabetes mellitus) type 2, uncontrolled, with ketoacidosis ([place]) 05/30/2015  . Solitary kidney 05/30/2015  . Acute pyelonephritis 05/27/2015  . Pain due to ureteral stent ([place]) 05/27/2015  . Enthesopathy of hip 04/11/2015  . Hepatic cirrhosis ([place]) 09/12/2014  . Fatty liver disease, nonalcoholic 76/19/5093  . Cervical pain 08/06/2014  . Clostridium difficile infection 02/04/2014  . D (diarrhea) 12/17/2013  . Arthralgia of hip 08/26/2013  . H/O urinary disorder 06/29/2013  . Acid indigestion 02/14/2012  . LBP (low back pain) 12/18/2011  . 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 Lumbar radiculopathy 09/05/2011  . Degeneration of intervertebral disc of lumbar region 08/23/2011  . Lumbar canal  stenosis 08/23/2011  . 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 Thoracic and lumbosacral neuritis 07/24/2011  . Diabetes mellitus ([place]) 07/24/2011    Past Surgical History:  Procedure Laterality Date  . BACK SURGERY    . kidney removed Right    2012  . NEPHRECTOMY Right 04/2011  . SPINE SURGERY     lumbar 2013     No current facility-administered medications for this  encounter.   Current Outpatient Medications:  .  acetaminophen (TYLENOL) 325 MG tablet, Take 650 mg by mouth., Disp: , Rfl:  .  cyclobenzaprine (FLEXERIL) 10 MG tablet, Take 1 tablet (10 mg total) by mouth at bedtime., Disp: 15 tablet, Rfl: 0 .  dicyclomine (BENTYL) 10 MG capsule, Take 10 mg by mouth 3 (three) times daily as needed for spasms. , Disp: , Rfl:  .  empagliflozin (JARDIANCE) 10 MG TABS tablet, Take 10 mg by mouth every morning., Disp: , Rfl:  .  insulin glargine (LANTUS) 100 UNIT/ML injection, Inject 100 Units into the skin at bedtime., Disp: , Rfl:  .  Liraglutide (VICTOZA [place]), Inject 0.6 mg into the skin., Disp: , Rfl:  .  metFORMIN (GLUCOPHAGE-XR) 500 MG 24 hr tablet, TAKE 2 TABLETS (1,000 MG TOTAL) BY MOUTH DAILY WITH DINNER., Disp: , Rfl:  .  omeprazole (PRILOSEC) 40 MG capsule, Take 40 mg by mouth at bedtime. , Disp: , Rfl:  .  oxybutynin (DITROPAN) 5 MG tablet, Take 1 tablet (5 mg total) by mouth every 8 (eight) hours as needed for bladder spasms., Disp: 15 tablet, Rfl: 0 .  pregabalin (LYRICA) 150 MG capsule, Take 1 capsule (150 mg total) by mouth 2 (two) times daily., Disp: 60 capsule, Rfl: 0 .  tiZANidine (ZANAFLEX) 4 MG capsule, Take 4 mg by mouth 3 (three) times daily., Disp: , Rfl:  .  traMADol (ULTRAM) 50 MG tablet, Take 50 mg by mouth every 6 (six) hours as needed for moderate pain. for pain, Disp: , Rfl: 1 .  atorvastatin (LIPITOR) 40 MG tablet, Take 40 mg by mouth., Disp: , Rfl:  .  doxycycline (VIBRAMYCIN) 100 MG capsule, Take 1 capsule (100 mg total) 2 (two) times daily by mouth., Disp: 20 capsule, Rfl: 0 .  LYRICA 50 MG capsule, Take 50 mg by mouth every morning., Disp: , Rfl: 1 .  mupirocin ointment (BACTROBAN) 2 %, Apply two times a day for 7 days., Disp: 22 g, Rfl: 0 .  nitroGLYCERIN (NITROSTAT) 0.3 MG SL tablet, Place 0.3 mg under the tongue., Disp: , Rfl:  .  ondansetron (ZOFRAN-ODT) 8 MG disintegrating tablet, Take 8 mg by mouth every 8 (eight) hours as  needed for nausea or vomiting., Disp: , Rfl:  .  oxyCODONE-acetaminophen (PERCOCET/ROXICET) 5-325 MG tablet, 1-2 tabs po qd prn, Disp: 5 tablet, Rfl: 0 .  ranitidine (ZANTAC) 150 MG capsule, Take 150 mg by mouth 2 (two) times daily., Disp: , Rfl:  .  tamsulosin (FLOMAX) 0.4 MG CAPS capsule, Take 0.4 mg by mouth at bedtime. , Disp: , Rfl:   Allergies Hydromorphone; Aspirin; and Floxin [ofloxacin]  Family History  Problem Relation Age of Onset  . Diabetes Mother   . Hypertension Mother   . Asthma Mother   . Heart disease Father   . Kidney disease Father   . Diabetes Father   . Stroke Father     Social History Social History   Tobacco Use  . Smoking status: Never Smoker  . Smokeless tobacco: Never Used  Substance Use  Topics  . Alcohol use: No  . Drug use: No    Review of Systems Constitutional: No fever/chills Cardiovascular: Denies chest pain. Respiratory: Denies shortness of breath. Gastrointestinal: No abdominal pain.   Musculoskeletal: Negative for back pain. Skin:AS above.   ____________________________________________   PHYSICAL EXAM:  VITAL SIGNS: ED Triage Vitals  Enc Vitals Group     BP 03/27/17 1752 133/77     Pulse Rate 03/27/17 1752 79     Resp 03/27/17 1752 12     Temp 03/27/17 1752 98.1 F (36.7 C)     Temp Source 03/27/17 1752 Oral     SpO2 03/27/17 1752 100 %     Weight 03/27/17 1754 265 lb (120.2 kg)     Height 03/27/17 1754 6\' 2"  (1.88 m)     Head Circumference --      Peak Flow --      Pain Score 03/27/17 1754 6     Pain Loc --      Pain Edu? --      Excl. in [place]? --     Constitutional: Alert and oriented. Well appearing and in no acute distress. Cardiovascular: Normal rate, regular rhythm. Grossly normal heart sounds.  Good peripheral circulation. Respiratory: Normal respiratory effort without tachypnea nor retractions. Breath sounds are clear and equal bilaterally. No wheezes, rales, rhonchi. Musculoskeletal:  No midline cervical,  thoracic or lumbar tenderness to palpation. Bilateral pedal pulses equal and easily palpated. Except: Right medial great toe area of appearance of a callus with whitish macerated appearance with scant exudate expressed, center of callus hole present with appearance of pink skin beneath, no actual fluctuance, mild tenderness to direct palpation, mild to moderate surrounding erythema to proximal phalanx, erythema does not extend past MTP, erythema noncircumferential, no appearance of necrosis, full range of motion present, distal sensation present and consistent with other toes of right foot, noted to have generalized decreased sensation to all of right foot.  Right lower extreme he otherwise nontender. Neurologic:  Normal speech and language. Speech is normal. No gait instability.  Skin:  Skin is warm, dry and intact. No rash noted. Psychiatric: Mood and affect are normal. Speech and behavior are normal. Patient exhibits appropriate insight and judgment   ___________________________________________   LABS (all labs ordered are listed, but only abnormal results are displayed)  Labs Reviewed  AEROBIC CULTURE (SUPERFICIAL SPECIMEN)    RADIOLOGY  Dg Toe Great Right  Result Date: 03/27/2017 CLINICAL DATA:  Lateral toe wound with drainage. EXAM: RIGHT GREAT TOE COMPARISON:  12/17/2015 FINDINGS: There is no evidence of fracture or dislocation. No osteolytic changes. Soft tissue edema. Gas bubble seen within the soft tissues laterally at the level of the interphalangeal joint. IMPRESSION: Soft tissue edema. Gas bubbles seen within the soft tissues of the first digit at the level of the interphalangeal joint may represent open wound or soft tissue emphysema. No radiographic findings of osteomyelitis. Electronically Signed   By: Fidela Salisbury M.D.   On: 03/27/2017 19:01   ____________________________________________   PROCEDURES Procedures    INITIAL IMPRESSION / ASSESSMENT AND PLAN / ED  COURSE  Pertinent labs & imaging results that were available during my care of the patient were reviewed by me and considered in my medical decision making (see chart for details).  Well-appearing patient.  No acute distress.  Suspect patient with callus area that progressed into the wound.  Caution regarding close foot monitoring and wound care.  Right great toe x-ray, per radiologist, soft  tissue edema and gas bubbles noted at the level of interphalangeal joint representing open wound or soft tissue emphysema, no radiographic finding of osteomyelitis.  Tetanus immunization also updated.  1 g IM Rocephin given in urgent care.  Cautioned regarding very strict follow-up and return parameters including going to ER.  Encouraged 2-day follow-up with primary care or wound clinic.  Discussed for any worsening pain, redness or swelling proceeding directly to the emergency room.  Discussed need to follow-up for continued wound care and possible debridement of callus area.  Will treat patient with oral doxycycline and topical Bactroban and care, encouraged wound care.Discussed indication, risks and benefits of medications with patient.  Discussed follow up with Primary care physician this week. Discussed follow up and return parameters including no resolution or any worsening concerns. Patient verbalized understanding and agreed to plan.   ____________________________________________   FINAL CLINICAL IMPRESSION(S) / ED DIAGNOSES  Final diagnoses:  Open wound of right great toe, initial encounter  Cellulitis of right toe     ED Discharge Orders        Ordered    doxycycline (VIBRAMYCIN) 100 MG capsule  2 times daily     03/27/17 1932    mupirocin ointment (BACTROBAN) 2 %     03/27/17 1932       Note: This dictation was prepared with Dragon dictation along with smaller phrase technology. Any transcriptional errors that result from this process are unintentional.           Marylene Land,  NP 03/27/17 2002

## 2017-03-31 ENCOUNTER — Telehealth (HOSPITAL_COMMUNITY): Payer: Self-pay | Admitting: Internal Medicine

## 2017-03-31 LAB — AEROBIC CULTURE W GRAM STAIN (SUPERFICIAL SPECIMEN)

## 2017-03-31 LAB — AEROBIC CULTURE  (SUPERFICIAL SPECIMEN)

## 2017-03-31 MED ORDER — AMOXICILLIN-POT CLAVULANATE 875-125 MG PO TABS: 1.0000 | ORAL_TABLET | Freq: Two times a day (BID) | ORAL | 0 refills | Status: AC

## 2017-03-31 NOTE — Telephone Encounter (Signed)
Clinical staff, please let patient know that toe wound culture was positive for MRSA and Acinetobacter germs.  Acinetobacter is not typically sensitive to doxycycline rx given at the urgent care visit 11/14 but is sensitive to amoxicillin/clavulanate.  Continue doxycycline.  Rx amoxicillin/clavulanate sent to the pharmacy of record, [facility] in [place]. Take both medications.  Followup with PCP and with wound care center, [phone], for further management of diabetic foot infection.  Untreated, these infections can be quite serious and can lead to the need for amputation.  Jodi Mourning MD

## 2017-04-14 ENCOUNTER — Other Ambulatory Visit: Payer: Self-pay

## 2017-04-14 ENCOUNTER — Ambulatory Visit
Admission: EM | Admit: 2017-04-14 | Discharge: 2017-04-14 | Disposition: A | Discharge status: Home / Self Care | Payer: 59 | Attending: Family Medicine | Admitting: Family Medicine

## 2017-04-14 ENCOUNTER — Encounter: Payer: Self-pay | Admitting: *Deleted

## 2017-04-14 DIAGNOSIS — Z5189 Encounter for other specified aftercare: Secondary | ICD-10-CM | POA: Diagnosis not present

## 2017-04-14 NOTE — Discharge Instructions (Signed)
Please be sure to see Podiatry.  Infection has cleared based on your exam.  Okay to proceed with surgery.   Take care  Dr. Lacinda Axon

## 2017-04-14 NOTE — ED Triage Notes (Signed)
Patient follow up wound check

## 2017-04-14 NOTE — ED Provider Notes (Signed)
[facility]    CSN: [id number] Arrival date & time: 04/14/17  1053   History   Chief Complaint Chief Complaint  Patient presents with  . Wound Check   HPI  56 year old male presents for reevaluation regarding wound.  Patient recently seen for a right great toe wound.  Cultures revealed MRSA and Acinetobacter.  He was treated with doxycycline and Augmentin.  He has finished the antibiotic course.  He states that his wound has greatly improved.  He is here for a recheck.  He states that he is having upcoming orthopedic surgery and needs to ensure that his wound is resolved prior to surgery.  No fevers or chills.  No reports of drainage from the wound.  He has been contacted by podiatry regarding an appointment.  He has no other complaints or concerns at this time.  Past Medical History:  Diagnosis Date  . Acid indigestion 02/14/2012  . Anxiety   . Arthritis    BIL.KNEES  . 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 Cancer ([place]) 2012   R kidney removed  . Cervicalgia   . Chest wall pain   . Clostridium difficile infection 02/04/2014  . DDD (degenerative disc disease), lumbar   . Degeneration of intervertebral disc of lumbar region 08/23/2011  . Diabetes mellitus ([place]) 07/24/2011  . Diabetes mellitus without complication ([place])   . Diabetic polyneuropathy associated with type 2 diabetes mellitus ([place])   . Diarrhea   . DM (diabetes mellitus) type 2, uncontrolled, with ketoacidosis ([place]) 05/30/2015  . Enthesopathy of hip 04/11/2015  . Fatty liver disease, nonalcoholic 08/12/9377  . GERD (gastroesophageal reflux disease)   . Hepatic cirrhosis ([place]) 09/12/2014  . Hydrocele   . Hypertension   . Lumbar herniated disc   . Lumbar radiculopathy 09/05/2011  . Peroneal tendon injury, right, initial encounter   . Radiculopathy   . 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  . Stenosis of cervical spine   . Thoracic and lumbosacral neuritis 07/24/2011  .  Thrombocytopathia [facility])     Patient Active Problem List   Diagnosis Date Noted  . Cervical spinal stenosis 06/24/2015  . Sepsis ([place]) 06/16/2015  . Left sided abdominal pain 05/30/2015  . Chest pain 05/30/2015  . CKD (chronic kidney disease), 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  . Hyponatremia 05/30/2015  . Leukocytosis 05/30/2015  . DM (diabetes mellitus) type 2, uncontrolled, with ketoacidosis ([place]) 05/30/2015  . Solitary kidney 05/30/2015  . Acute pyelonephritis 05/27/2015  . Pain due to ureteral stent ([place]) 05/27/2015  . Enthesopathy of hip 04/11/2015  . Hepatic cirrhosis ([place]) 09/12/2014  . Fatty liver disease, nonalcoholic 02/40/9735  . Cervical pain 08/06/2014  . Clostridium difficile infection 02/04/2014  . D (diarrhea) 12/17/2013  . Arthralgia of hip 08/26/2013  . H/O urinary disorder 06/29/2013  . Acid indigestion 02/14/2012  . LBP (low back pain) 12/18/2011  . 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 Lumbar radiculopathy 09/05/2011  . Degeneration of intervertebral disc of lumbar region 08/23/2011  . Lumbar canal stenosis 08/23/2011  . 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 Thoracic and lumbosacral neuritis 07/24/2011  . Diabetes mellitus ([place]) 07/24/2011    Past Surgical History:  Procedure Laterality Date  . BACK SURGERY    . CARDIAC CATHETERIZATION Right 06/20/2015   Procedure: Left Heart Cath and Coronary Angiography;  Surgeon: Dionisio David, MD;  Location: [place] [facility];  Service: Cardiovascular;  Laterality: Right;  . CYSTOSCOPY W/  RETROGRADES Left 05/18/2015   Procedure: CYSTOSCOPY WITH RETROGRADE PYELOGRAM;  Surgeon: Nickie Retort, MD;  Location: [facility];  Service: Urology;  Laterality: Left;  . CYSTOSCOPY WITH STENT PLACEMENT Left 05/18/2015   Procedure: CYSTOSCOPY WITH STENT PLACEMENT;  Surgeon: Nickie Retort, MD;  Location: [facility];  Service: Urology;  Laterality: Left;  . CYSTOSCOPY WITH STENT PLACEMENT Left 06/28/2015    Procedure: CYSTOSCOPY WITH STENT PLACEMENT/ EXCHANGE;  Surgeon: Hollice Espy, MD;  Location: [facility];  Service: Urology;  Laterality: Left;  . ESOPHAGOGASTRODUODENOSCOPY (EGD) WITH PROPOFOL N/A 09/03/2016   Procedure: ESOPHAGOGASTRODUODENOSCOPY (EGD) WITH PROPOFOL;  Surgeon: Manya Silvas, MD;  Location: [facility];  Service: Endoscopy;  Laterality: N/A;  . kidney removed Right    2012  . NEPHRECTOMY Right 04/2011  . SPINE SURGERY     lumbar 2013  . URETEROSCOPY WITH HOLMIUM LASER LITHOTRIPSY Left 05/18/2015   Procedure: URETEROSCOPY WITH HOLMIUM LASER LITHOTRIPSY;  Surgeon: Nickie Retort, MD;  Location: [facility];  Service: Urology;  Laterality: Left;  . URETEROSCOPY WITH HOLMIUM LASER LITHOTRIPSY Left 06/28/2015   Procedure: URETEROSCOPY WITH HOLMIUM LASER LITHOTRIPSY;  Surgeon: Hollice Espy, MD;  Location: [facility];  Service: Urology;  Laterality: Left;       Home Medications    Prior to Admission medications   Medication Sig Start Date End Date Taking? Authorizing Provider  cyclobenzaprine (FLEXERIL) 10 MG tablet Take 1 tablet (10 mg total) by mouth at bedtime. 12/31/16  Yes Norval Gable, MD  dicyclomine (BENTYL) 10 MG capsule Take 10 mg by mouth 3 (three) times daily as needed for spasms.    Yes [provider]  empagliflozin (JARDIANCE) 10 MG TABS tablet Take 10 mg by mouth every morning.   Yes [provider]  insulin glargine (LANTUS) 100 UNIT/ML injection Inject 100 Units into the skin at bedtime.   Yes [provider]  Liraglutide (VICTOZA [place]) Inject 0.6 mg into the skin.   Yes [provider]  metFORMIN (GLUCOPHAGE-XR) 500 MG 24 hr tablet TAKE 2 TABLETS (1,000 MG TOTAL) BY MOUTH DAILY WITH DINNER. 12/05/15  Yes [provider]  mupirocin ointment (BACTROBAN) 2 % Apply two times a day for 7 days. 03/27/17  Yes Marylene Land, NP  nitroGLYCERIN (NITROSTAT) 0.3 MG SL tablet Place 0.3 mg under the tongue. 07/20/16 07/20/17 Yes  [provider]  omeprazole (PRILOSEC) 40 MG capsule Take 40 mg by mouth at bedtime.    Yes [provider]  pregabalin (LYRICA) 200 MG capsule Take 200 mg by mouth Nightly.   Yes [provider]  ranitidine (ZANTAC) 150 MG capsule Take 150 mg by mouth 2 (two) times daily.   Yes [provider]  tamsulosin (FLOMAX) 0.4 MG CAPS capsule Take 0.4 mg by mouth at bedtime.    Yes [provider]  tiZANidine (ZANAFLEX) 4 MG capsule Take 4 mg by mouth 3 (three) times daily.   Yes [provider]  traMADol (ULTRAM) 50 MG tablet Take 50 mg by mouth every 6 (six) hours as needed for moderate pain. for pain   Yes [provider]  ondansetron (ZOFRAN-ODT) 8 MG disintegrating tablet Take 8 mg by mouth every 8 (eight) hours as needed for nausea or vomiting.    [provider]    Family History Family History  Problem Relation Age of Onset  . Diabetes Mother   . Hypertension Mother   . Asthma Mother   . Heart disease Father   . Kidney disease  Father   . Diabetes Father   . Stroke Father     Social History Social History   Tobacco Use  . Smoking status: Never Smoker  . Smokeless tobacco: Never Used  Substance Use Topics  . Alcohol use: No  . Drug use: No    Allergies   Hydromorphone; Aspirin; and Floxin [ofloxacin]  Review of Systems Review of Systems  Constitutional: Negative.   Skin: Positive for wound.   Physical Exam Triage Vital Signs ED Triage Vitals  Enc Vitals Group     BP 04/14/17 1135 140/82     Pulse Rate 04/14/17 1135 69     Resp 04/14/17 1135 16     Temp 04/14/17 1135 97.9 F (36.6 C)     Temp Source 04/14/17 1135 Oral     SpO2 04/14/17 1135 98 %     Weight --      Height --      Head Circumference --      Peak Flow --      Pain Score 04/14/17 1137 0     Pain Loc --      Pain Edu? --      Excl. in [place]? --    Updated Vital Signs BP 140/82 (BP Location: Right Arm)   Pulse 69   Temp 97.9 F  (36.6 C) (Oral)   Resp 16   SpO2 98%   Physical Exam  Constitutional: He is oriented to person, place, and time. He appears well-developed. No distress.  Cardiovascular: Normal rate and regular rhythm.  No murmur heard. Pulmonary/Chest: Effort normal and breath sounds normal. He has no wheezes. He has no rales.  Neurological: He is alert and oriented to person, place, and time.  Skin:  Right great toe -minimal erythema.  Callus noted.  No tenderness or drainage noted.  Psychiatric: He has a normal mood and affect. His behavior is normal.  Nursing note and vitals reviewed.    UC Treatments / Results  Labs (all labs ordered are listed, but only abnormal results are displayed) Labs Reviewed - No data to display  EKG  EKG Interpretation None       Radiology No results found.  Procedures Procedures (including critical care time)  Medications Ordered in UC Medications - No data to display   Initial Impression / Assessment and Plan / UC Course  I have reviewed the triage vital signs and the nursing notes.  Pertinent labs & imaging results that were available during my care of the patient were reviewed by me and considered in my medical decision making (see chart for details).     56 year old male presents for wound recheck.  No evidence of infection at this time.  Patient advised to follow-up with podiatry.   Final Clinical Impressions(s) / UC Diagnoses   Final diagnoses:  Visit for wound check    ED Discharge Orders    None     Controlled Substance Prescriptions [place] Controlled Substance Registry consulted? Not Applicable   Coral Spikes, DO 04/14/17 1309

## 2017-05-20 DIAGNOSIS — K588 Other irritable bowel syndrome: Secondary | ICD-10-CM | POA: Insufficient documentation

## 2017-05-22 DIAGNOSIS — M1712 Unilateral primary osteoarthritis, left knee: Secondary | ICD-10-CM | POA: Insufficient documentation

## 2017-05-23 DIAGNOSIS — Z96652 Presence of left artificial knee joint: Secondary | ICD-10-CM | POA: Insufficient documentation

## 2017-07-18 DIAGNOSIS — R519 Headache, unspecified: Secondary | ICD-10-CM | POA: Insufficient documentation

## 2017-07-30 ENCOUNTER — Other Ambulatory Visit: Payer: Self-pay | Admitting: Podiatry

## 2017-07-30 DIAGNOSIS — M79674 Pain in right toe(s): Secondary | ICD-10-CM

## 2017-07-30 DIAGNOSIS — M7989 Other specified soft tissue disorders: Principal | ICD-10-CM

## 2017-08-07 ENCOUNTER — Ambulatory Visit
Admission: RE | Admit: 2017-08-07 | Discharge: 2017-08-07 | Disposition: A | Discharge status: Home / Self Care | Payer: 59 | Source: Ambulatory Visit | Attending: Podiatry | Admitting: Podiatry

## 2017-08-07 DIAGNOSIS — M7671 Peroneal tendinitis, right leg: Secondary | ICD-10-CM | POA: Insufficient documentation

## 2017-08-07 DIAGNOSIS — M79674 Pain in right toe(s): Secondary | ICD-10-CM

## 2017-08-07 DIAGNOSIS — M7989 Other specified soft tissue disorders: Secondary | ICD-10-CM

## 2017-08-15 ENCOUNTER — Other Ambulatory Visit: Payer: Self-pay | Admitting: Podiatry

## 2017-08-20 ENCOUNTER — Encounter
Admission: RE | Admit: 2017-08-20 | Discharge: 2017-08-20 | Disposition: A | Discharge status: Home / Self Care | Payer: 59 | Source: Ambulatory Visit | Attending: Podiatry | Admitting: Podiatry

## 2017-08-20 DIAGNOSIS — S92351A Displaced fracture of fifth metatarsal bone, right foot, initial encounter for closed fracture: Secondary | ICD-10-CM | POA: Diagnosis not present

## 2017-08-20 DIAGNOSIS — Z6834 Body mass index (BMI) 34.0-34.9, adult: Secondary | ICD-10-CM | POA: Diagnosis not present

## 2017-08-20 DIAGNOSIS — F419 Anxiety disorder, unspecified: Secondary | ICD-10-CM | POA: Diagnosis not present

## 2017-08-20 DIAGNOSIS — Z885 Allergy status to narcotic agent status: Secondary | ICD-10-CM | POA: Diagnosis not present

## 2017-08-20 DIAGNOSIS — E669 Obesity, unspecified: Secondary | ICD-10-CM | POA: Diagnosis not present

## 2017-08-20 DIAGNOSIS — X58XXXA Exposure to other specified factors, initial encounter: Secondary | ICD-10-CM | POA: Diagnosis not present

## 2017-08-20 DIAGNOSIS — Z886 Allergy status to analgesic agent status: Secondary | ICD-10-CM | POA: Diagnosis not present

## 2017-08-20 DIAGNOSIS — K219 Gastro-esophageal reflux disease without esophagitis: Secondary | ICD-10-CM | POA: Diagnosis not present

## 2017-08-20 DIAGNOSIS — Z85528 Personal history of other malignant neoplasm of kidney: Secondary | ICD-10-CM | POA: Diagnosis not present

## 2017-08-20 DIAGNOSIS — K760 Fatty (change of) liver, not elsewhere classified: Secondary | ICD-10-CM | POA: Diagnosis not present

## 2017-08-20 DIAGNOSIS — Z8249 Family history of ischemic heart disease and other diseases of the circulatory system: Secondary | ICD-10-CM | POA: Diagnosis not present

## 2017-08-20 DIAGNOSIS — K7460 Unspecified cirrhosis of liver: Secondary | ICD-10-CM | POA: Diagnosis not present

## 2017-08-20 DIAGNOSIS — Z7984 Long term (current) use of oral hypoglycemic drugs: Secondary | ICD-10-CM | POA: Diagnosis not present

## 2017-08-20 DIAGNOSIS — Z79899 Other long term (current) drug therapy: Secondary | ICD-10-CM | POA: Diagnosis not present

## 2017-08-20 DIAGNOSIS — Z8614 Personal history of Methicillin resistant Staphylococcus aureus infection: Secondary | ICD-10-CM | POA: Diagnosis not present

## 2017-08-20 DIAGNOSIS — I10 Essential (primary) hypertension: Secondary | ICD-10-CM | POA: Diagnosis not present

## 2017-08-20 DIAGNOSIS — E119 Type 2 diabetes mellitus without complications: Secondary | ICD-10-CM | POA: Diagnosis not present

## 2017-08-20 DIAGNOSIS — M216X1 Other acquired deformities of right foot: Secondary | ICD-10-CM | POA: Diagnosis not present

## 2017-08-20 DIAGNOSIS — Z981 Arthrodesis status: Secondary | ICD-10-CM | POA: Diagnosis not present

## 2017-08-20 DIAGNOSIS — Z888 Allergy status to other drugs, medicaments and biological substances status: Secondary | ICD-10-CM | POA: Diagnosis not present

## 2017-08-20 LAB — SURGICAL PCR SCREEN
MRSA, PCR: NEGATIVE
Staphylococcus aureus: NEGATIVE

## 2017-08-20 NOTE — Patient Instructions (Signed)
Your procedure is scheduled on: August 23, 2017 FRIDAY Report to Day Surgery on the 2nd floor of the [facility]. To find out your arrival time, please call [phone] between 1PM - 3PM on: August 22, 2017 THURSDAY  REMEMBER: Instructions that are not followed completely may result in serious medical risk, up to and including death; or upon the discretion of your surgeon and anesthesiologist your surgery may need to be rescheduled.  Do not eat food after midnight the night before your procedure.  No gum chewing, lozengers or hard candies.  You may however, drink CLEAR liquids up to 2 hours before you are scheduled to arrive for your surgery. Do not drink anything within 2 hours of the start of your surgery.  Clear liquids include: - water   Do NOT drink anything that is not on this list.  Type 1 and Type 2 diabetics should only drink water.  No Alcohol for 24 hours before or after surgery.  No Smoking including e-cigarettes for 24 hours prior to surgery.  No chewable tobacco products for at least 6 hours prior to surgery.  No nicotine patches on the day of surgery.  On the morning of surgery brush your teeth with toothpaste and water, you may rinse your mouth with mouthwash if you wish. Do not swallow any toothpaste or mouthwash.  Notify your doctor if there is any change in your medical condition (cold, fever, infection).  Do not wear jewelry, make-up, hairpins, clips or nail polish.  Do not wear lotions, powders, or perfumes. You may wear deodorant.  Do not shave 48 hours prior to surgery. Men may shave face and neck.  Contacts and dentures may not be worn into surgery.  Do not bring valuables to the hospital, including drivers license, insurance or credit cards.  [facility] is not responsible for any belongings or valuables.   TAKE THESE MEDICATIONS THE MORNING OF SURGERY: WITH WATER OMEPRAZOLE - ADD DOSE MORNING OF SURGERY  Use CHG Soap or wipes as directed on  instruction sheet.  Stop Metformin  2 days prior to surgery.  Stop Anti-inflammatories (NSAIDS) such as Advil, Aleve, Ibuprofen, Motrin, Naproxen, Naprosyn and Aspirin based products such as Excedrin, Goodys Powder, BC Powder. (May take Tylenol or Acetaminophen if needed.)   Wear comfortable clothing (specific to your surgery type) to the hospital.  Plan for stool softeners for home use.  If you are being admitted to the hospital overnight, leave your suitcase in the car. After surgery it may be brought to your room.  If you are being discharged the day of surgery, you will not be allowed to drive home. You will need a responsible adult to drive you home and stay with you that night.   If you are taking public transportation, you will need to have a responsible adult with you. Please confirm with your physician that it is acceptable to use public transportation.   Please call [phone] if you have any questions about these instructions.

## 2017-08-22 MED ORDER — CEFAZOLIN SODIUM-DEXTROSE 2-4 GM/100ML-% IV SOLN
2.0000 g | INTRAVENOUS | Status: AC
  Administered 2017-08-23: 2 g via INTRAVENOUS
  Filled 2017-08-22: qty 100

## 2017-08-23 ENCOUNTER — Encounter: Payer: Self-pay | Admitting: Anesthesiology

## 2017-08-23 ENCOUNTER — Ambulatory Visit
Admission: RE | Admit: 2017-08-23 | Discharge: 2017-08-23 | Disposition: A | Discharge status: Home / Self Care | Payer: 59 | Source: Ambulatory Visit | Attending: Podiatry | Admitting: Podiatry

## 2017-08-23 ENCOUNTER — Other Ambulatory Visit: Payer: Self-pay

## 2017-08-23 ENCOUNTER — Encounter
Admission: RE | Disposition: A | Discharge status: Home / Self Care | Payer: Self-pay | Source: Ambulatory Visit | Attending: Podiatry

## 2017-08-23 ENCOUNTER — Ambulatory Visit: Payer: 59 | Admitting: Anesthesiology

## 2017-08-23 DIAGNOSIS — K7460 Unspecified cirrhosis of liver: Secondary | ICD-10-CM | POA: Insufficient documentation

## 2017-08-23 DIAGNOSIS — E119 Type 2 diabetes mellitus without complications: Secondary | ICD-10-CM | POA: Insufficient documentation

## 2017-08-23 DIAGNOSIS — K219 Gastro-esophageal reflux disease without esophagitis: Secondary | ICD-10-CM | POA: Insufficient documentation

## 2017-08-23 DIAGNOSIS — Z8614 Personal history of Methicillin resistant Staphylococcus aureus infection: Secondary | ICD-10-CM | POA: Insufficient documentation

## 2017-08-23 DIAGNOSIS — K760 Fatty (change of) liver, not elsewhere classified: Secondary | ICD-10-CM | POA: Insufficient documentation

## 2017-08-23 DIAGNOSIS — X58XXXA Exposure to other specified factors, initial encounter: Secondary | ICD-10-CM | POA: Insufficient documentation

## 2017-08-23 DIAGNOSIS — M216X1 Other acquired deformities of right foot: Secondary | ICD-10-CM | POA: Insufficient documentation

## 2017-08-23 DIAGNOSIS — F419 Anxiety disorder, unspecified: Secondary | ICD-10-CM | POA: Insufficient documentation

## 2017-08-23 DIAGNOSIS — Z8249 Family history of ischemic heart disease and other diseases of the circulatory system: Secondary | ICD-10-CM | POA: Insufficient documentation

## 2017-08-23 DIAGNOSIS — S92351A Displaced fracture of fifth metatarsal bone, right foot, initial encounter for closed fracture: Secondary | ICD-10-CM | POA: Diagnosis not present

## 2017-08-23 DIAGNOSIS — Z7984 Long term (current) use of oral hypoglycemic drugs: Secondary | ICD-10-CM | POA: Insufficient documentation

## 2017-08-23 DIAGNOSIS — Z981 Arthrodesis status: Secondary | ICD-10-CM | POA: Insufficient documentation

## 2017-08-23 DIAGNOSIS — Z85528 Personal history of other malignant neoplasm of kidney: Secondary | ICD-10-CM | POA: Insufficient documentation

## 2017-08-23 DIAGNOSIS — Z888 Allergy status to other drugs, medicaments and biological substances status: Secondary | ICD-10-CM | POA: Insufficient documentation

## 2017-08-23 DIAGNOSIS — Z886 Allergy status to analgesic agent status: Secondary | ICD-10-CM | POA: Insufficient documentation

## 2017-08-23 DIAGNOSIS — Z885 Allergy status to narcotic agent status: Secondary | ICD-10-CM | POA: Insufficient documentation

## 2017-08-23 DIAGNOSIS — E669 Obesity, unspecified: Secondary | ICD-10-CM | POA: Insufficient documentation

## 2017-08-23 DIAGNOSIS — Z79899 Other long term (current) drug therapy: Secondary | ICD-10-CM | POA: Insufficient documentation

## 2017-08-23 DIAGNOSIS — Z6834 Body mass index (BMI) 34.0-34.9, adult: Secondary | ICD-10-CM | POA: Insufficient documentation

## 2017-08-23 DIAGNOSIS — I10 Essential (primary) hypertension: Secondary | ICD-10-CM | POA: Insufficient documentation

## 2017-08-23 HISTORY — PX: HALLUX VALGUS BASE WEDGE: SHX6624

## 2017-08-23 HISTORY — PX: ORIF TOE FRACTURE: SHX5032

## 2017-08-23 LAB — GLUCOSE, CAPILLARY
Glucose-Capillary: 143 mg/dL — ABNORMAL HIGH (ref 65–99)
Glucose-Capillary: 117 mg/dL — ABNORMAL HIGH (ref 65–99)

## 2017-08-23 SURGERY — HALLUX VALGUS BASE WEDGE
Anesthesia: General | Site: Foot | Laterality: Right | Wound class: Clean

## 2017-08-23 MED ORDER — POVIDONE-IODINE 7.5 % EX SOLN
Freq: Once | CUTANEOUS | Status: DC
  Filled 2017-08-23: qty 118

## 2017-08-23 MED ORDER — BUPIVACAINE HCL (PF) 0.5 % IJ SOLN
INTRAMUSCULAR | Status: AC
  Filled 2017-08-23: qty 30

## 2017-08-23 MED ORDER — PROMETHAZINE HCL 25 MG/ML IJ SOLN: 6.2500 mg | INTRAMUSCULAR | Status: DC | PRN

## 2017-08-23 MED ORDER — SODIUM CHLORIDE 0.9 % IV SOLN
INTRAVENOUS | Status: DC
  Administered 2017-08-23: 10:00:00 via INTRAVENOUS

## 2017-08-23 MED ORDER — CEFAZOLIN SODIUM-DEXTROSE 2-4 GM/100ML-% IV SOLN: 2.0000 g | INTRAVENOUS | Status: DC

## 2017-08-23 MED ORDER — MIDAZOLAM HCL 2 MG/2ML IJ SOLN
1.0000 mg | Freq: Once | INTRAMUSCULAR | Status: AC
  Administered 2017-08-23: 1 mg via INTRAVENOUS

## 2017-08-23 MED ORDER — FENTANYL CITRATE (PF) 100 MCG/2ML IJ SOLN
50.0000 ug | Freq: Once | INTRAMUSCULAR | Status: AC
  Administered 2017-08-23: 50 ug via INTRAVENOUS

## 2017-08-23 MED ORDER — ONDANSETRON HCL 4 MG/2ML IJ SOLN: 4.0000 mg | Freq: Four times a day (QID) | INTRAMUSCULAR | Status: DC | PRN

## 2017-08-23 MED ORDER — FENTANYL CITRATE (PF) 100 MCG/2ML IJ SOLN: 25.0000 ug | INTRAMUSCULAR | Status: DC | PRN

## 2017-08-23 MED ORDER — ROPIVACAINE HCL 5 MG/ML IJ SOLN
INTRAMUSCULAR | Status: AC
  Filled 2017-08-23: qty 30

## 2017-08-23 MED ORDER — LIDOCAINE HCL (CARDIAC) 20 MG/ML IV SOLN
INTRAVENOUS | Status: DC | PRN
  Administered 2017-08-23: 100 mg via INTRAVENOUS

## 2017-08-23 MED ORDER — BUPIVACAINE HCL (PF) 0.25 % IJ SOLN
INTRAMUSCULAR | Status: AC
  Filled 2017-08-23: qty 30

## 2017-08-23 MED ORDER — BUPIVACAINE-EPINEPHRINE (PF) 0.25% -1:200000 IJ SOLN
INTRAMUSCULAR | Status: AC
  Filled 2017-08-23: qty 30

## 2017-08-23 MED ORDER — PROPOFOL 10 MG/ML IV BOLUS
INTRAVENOUS | Status: AC
  Filled 2017-08-23: qty 20

## 2017-08-23 MED ORDER — ROPIVACAINE HCL 5 MG/ML IJ SOLN
INTRAMUSCULAR | Status: DC | PRN
  Administered 2017-08-23: 30 mL via PERINEURAL

## 2017-08-23 MED ORDER — LIDOCAINE HCL (PF) 1 % IJ SOLN
INTRAMUSCULAR | Status: DC | PRN
  Administered 2017-08-23: 5 mL via SUBCUTANEOUS

## 2017-08-23 MED ORDER — PROPOFOL 10 MG/ML IV BOLUS
INTRAVENOUS | Status: DC | PRN
  Administered 2017-08-23: 200 mg via INTRAVENOUS

## 2017-08-23 MED ORDER — CEFAZOLIN SODIUM-DEXTROSE 2-3 GM-%(50ML) IV SOLR
INTRAVENOUS | Status: AC
  Filled 2017-08-23: qty 50

## 2017-08-23 MED ORDER — FENTANYL CITRATE (PF) 100 MCG/2ML IJ SOLN
INTRAMUSCULAR | Status: DC | PRN
  Administered 2017-08-23: 100 ug via INTRAVENOUS

## 2017-08-23 MED ORDER — OXYCODONE-ACETAMINOPHEN 5-325 MG PO TABS: 1.0000 | ORAL_TABLET | Freq: Four times a day (QID) | ORAL | 0 refills | Status: DC | PRN

## 2017-08-23 MED ORDER — FENTANYL CITRATE (PF) 100 MCG/2ML IJ SOLN
INTRAMUSCULAR | Status: AC
  Filled 2017-08-23: qty 2

## 2017-08-23 MED ORDER — ONDANSETRON HCL 4 MG/2ML IJ SOLN
INTRAMUSCULAR | Status: AC
  Filled 2017-08-23: qty 2

## 2017-08-23 MED ORDER — LIDOCAINE HCL (PF) 2 % IJ SOLN
INTRAMUSCULAR | Status: AC
  Filled 2017-08-23: qty 10

## 2017-08-23 MED ORDER — BUPIVACAINE HCL (PF) 0.25 % IJ SOLN
INTRAMUSCULAR | Status: DC | PRN
  Administered 2017-08-23: 10 mL
  Administered 2017-08-23: 15 mL

## 2017-08-23 MED ORDER — MIDAZOLAM HCL 2 MG/2ML IJ SOLN
1.0000 mg | Freq: Once | INTRAMUSCULAR | Status: AC
  Administered 2017-08-23: 2 mg via INTRAVENOUS

## 2017-08-23 MED ORDER — MIDAZOLAM HCL 2 MG/2ML IJ SOLN: INTRAMUSCULAR | Status: DC | PRN

## 2017-08-23 MED ORDER — LIDOCAINE HCL (PF) 1 % IJ SOLN
INTRAMUSCULAR | Status: AC
  Filled 2017-08-23: qty 5

## 2017-08-23 MED ORDER — MIDAZOLAM HCL 2 MG/2ML IJ SOLN
INTRAMUSCULAR | Status: AC
  Filled 2017-08-23: qty 2

## 2017-08-23 MED ORDER — ONDANSETRON HCL 4 MG/2ML IJ SOLN
INTRAMUSCULAR | Status: DC | PRN
  Administered 2017-08-23: 4 mg via INTRAVENOUS

## 2017-08-23 MED ORDER — POVIDONE-IODINE 7.5 % EX SOLN: Freq: Once | CUTANEOUS | Status: DC

## 2017-08-23 MED ORDER — MIDAZOLAM HCL 2 MG/2ML IJ SOLN
INTRAMUSCULAR | Status: AC
  Administered 2017-08-23: 1 mg via INTRAVENOUS
  Filled 2017-08-23: qty 2

## 2017-08-23 MED ORDER — FENTANYL CITRATE (PF) 100 MCG/2ML IJ SOLN
INTRAMUSCULAR | Status: AC
  Administered 2017-08-23: 50 ug via INTRAVENOUS
  Filled 2017-08-23: qty 2

## 2017-08-23 MED ORDER — ONDANSETRON HCL 4 MG PO TABS: 4.0000 mg | ORAL_TABLET | Freq: Four times a day (QID) | ORAL | Status: DC | PRN

## 2017-08-23 MED ORDER — LIDOCAINE HCL (PF) 1 % IJ SOLN
INTRAMUSCULAR | Status: AC
  Filled 2017-08-23: qty 30

## 2017-08-23 SURGICAL SUPPLY — 60 items
3.2 cannulated drill ×2 IMPLANT
BAG COUNTER SPONGE EZ (MISCELLANEOUS) ×2 IMPLANT
BANDAGE ACE 4X5 VEL STRL LF (GAUZE/BANDAGES/DRESSINGS) ×2 IMPLANT
BANDAGE ELASTIC 4 LF NS (GAUZE/BANDAGES/DRESSINGS) ×4 IMPLANT
BIT DRILL 3.2X150 CANNULATED (BIT) ×1 IMPLANT
BIT DRILL CANNULATED 2.8 (DRILL) ×1 IMPLANT
BIT DRILL CANNULATED 3.8MM (DRILL) ×1 IMPLANT
BLADE MED AGGRESSIVE (BLADE) ×2 IMPLANT
BLADE SURG 15 STRL LF DISP TIS (BLADE) ×2 IMPLANT
BLADE SURG 15 STRL SS (BLADE) ×2
BNDG COHESIVE 4X5 TAN STRL (GAUZE/BANDAGES/DRESSINGS) ×2 IMPLANT
BNDG CONFORM 3 STRL LF (GAUZE/BANDAGES/DRESSINGS) ×2 IMPLANT
BNDG ESMARK 4X12 TAN STRL LF (GAUZE/BANDAGES/DRESSINGS) ×2 IMPLANT
BNDG GAUZE 4.5X4.1 6PLY STRL (MISCELLANEOUS) ×2 IMPLANT
BNDG STRETCH 4X75 STRL LF (GAUZE/BANDAGES/DRESSINGS) ×2 IMPLANT
BONE STAPLE NITINOL 15X15X15 (Miscellaneous) ×2 IMPLANT
CANISTER SUCT 1200ML W/VALVE (MISCELLANEOUS) ×4 IMPLANT
CLIP DYNAFORCE HIMAX 15X15X15 (Miscellaneous) ×1 IMPLANT
COUNTER SINK 5.5 (MISCELLANEOUS) ×2
CUFF TOURN 24 STER (MISCELLANEOUS) ×2 IMPLANT
Cannulated drill ×2 IMPLANT
DRAPE FLUOR MINI C-ARM 54X84 (DRAPES) ×2 IMPLANT
DRILL 3.2X150 CANNULATED (BIT) ×2
DRILL CANNULATED 2.8 (DRILL) ×2
DRILL CANNULATED 3.8MM (DRILL) ×2
DURAPREP 26ML APPLICATOR (WOUND CARE) ×2 IMPLANT
DYNAFORCE HIMAX 15X15X15 (Miscellaneous) ×2 IMPLANT
ELECT REM PT RETURN 9FT ADLT (ELECTROSURGICAL) ×2
ELECTRODE REM PT RTRN 9FT ADLT (ELECTROSURGICAL) ×1 IMPLANT
GAUZE PETRO XEROFOAM 1X8 (MISCELLANEOUS) ×2 IMPLANT
GAUZE SPONGE 4X4 12PLY STRL (GAUZE/BANDAGES/DRESSINGS) ×2 IMPLANT
GAUZE STRETCH 2X75IN STRL (MISCELLANEOUS) ×2 IMPLANT
GAUZE XEROFORM 4X4 STRL (GAUZE/BANDAGES/DRESSINGS) ×2 IMPLANT
GLOVE BIO SURGEON STRL SZ7.5 (GLOVE) ×2 IMPLANT
GLOVE INDICATOR 8.0 STRL GRN (GLOVE) ×2 IMPLANT
GOWN STRL REUS W/ TWL LRG LVL3 (GOWN DISPOSABLE) ×2 IMPLANT
GOWN STRL REUS W/TWL LRG LVL3 (GOWN DISPOSABLE) ×2
KIT STAPLE BONE HIMAX 15X15X15 (Miscellaneous) ×1 IMPLANT
NEEDLE FILTER BLUNT 18X 1/2SAF (NEEDLE) ×1
NEEDLE FILTER BLUNT 18X1 1/2 (NEEDLE) ×1 IMPLANT
NEEDLE HYPO 25X1 1.5 SAFETY (NEEDLE) ×4 IMPLANT
NS IRRIG 500ML POUR BTL (IV SOLUTION) ×2 IMPLANT
Nitinol wire 1.5 x 200 ×4 IMPLANT
PACK EXTREMITY ARMC (MISCELLANEOUS) ×2 IMPLANT
PAD ABD DERMACEA PRESS 5X9 (GAUZE/BANDAGES/DRESSINGS) ×2 IMPLANT
PADDING CAST BLEND 4X4 NS (MISCELLANEOUS) ×2 IMPLANT
PENCIL ELECTRO HAND CTR (MISCELLANEOUS) ×2 IMPLANT
SCREW 5.5X48 ST (Screw) ×2 IMPLANT
SCREW COUNTERSINK 5.5 (MISCELLANEOUS) ×1 IMPLANT
SPLINT CAST 1 STEP 4X30 (MISCELLANEOUS) ×2 IMPLANT
SPLINT FAST PLASTER 5X30 (CAST SUPPLIES) ×1
SPLINT PLASTER CAST FAST 5X30 (CAST SUPPLIES) ×1 IMPLANT
STOCKINETTE M/LG 89821 (MISCELLANEOUS) ×2 IMPLANT
STRAP SAFETY 5IN WIDE (MISCELLANEOUS) ×2 IMPLANT
STRIP CLOSURE SKIN 1/4X4 (GAUZE/BANDAGES/DRESSINGS) ×2 IMPLANT
SUT ETHILON 3-0 (SUTURE) ×2 IMPLANT
SUT VIC AB 4-0 FS2 27 (SUTURE) ×2 IMPLANT
SYR 10ML LL (SYRINGE) ×2 IMPLANT
WIRE 1.5 NITINOL (MISCELLANEOUS) ×4 IMPLANT
WIRE Z .062 C-WIRE SPADE TIP (WIRE) ×2 IMPLANT

## 2017-08-23 NOTE — Anesthesia Post-op Follow-up Note (Signed)
Anesthesia QCDR form completed.        

## 2017-08-23 NOTE — H&P (Signed)
HISTORY AND PHYSICAL INTERVAL NOTE:  08/23/2017  11:03 AM  Anthony Fuller  has presented today for surgery, with the diagnosis of Rudene Anda.  The various methods of treatment have been discussed with the patient.  No guarantees were given.  After consideration of risks, benefits and other options for treatment, the patient has consented to surgery.  I have reviewed the patients' chart and labs.    Patient Vitals for the past 24 hrs:  BP Temp Temp src Pulse Resp SpO2 Height Weight  08/23/17 1028 121/84 - - 80 17 97 % - -  08/23/17 1018 125/87 - - 77 16 97 % - -  08/23/17 1013 127/88 - - 79 14 98 % - -  08/23/17 1008 140/87 - - 74 16 100 % - -  08/23/17 0920 119/76 98.1 F (36.7 C) Oral 77 20 100 % 6\' 2"  (1.88 m) 122.5 kg (270 lb)    A history and physical examination was performed in my office.  The patient was reexamined.  There have been no changes to this history and physical examination.  Samara Deist A

## 2017-08-23 NOTE — Anesthesia Postprocedure Evaluation (Signed)
Anesthesia Post Note  Patient: Anthony Fuller  Procedure(s) Performed: HALLUX VALGUS BASE WEDGE (Right Foot) OPEN REDUCTION INTERNAL FIXATION (ORIF) METATARSAL (TOE) FRACTURE (Right Foot)  Patient location during evaluation: PACU Anesthesia Type: General and Regional Level of consciousness: awake and alert Pain management: pain level controlled Vital Signs Assessment: post-procedure vital signs reviewed and stable Respiratory status: spontaneous breathing, nonlabored ventilation, respiratory function stable and patient connected to nasal cannula oxygen Cardiovascular status: blood pressure returned to baseline and stable Postop Assessment: no apparent nausea or vomiting Anesthetic complications: no     Last Vitals:  Vitals:   08/23/17 1410 08/23/17 1411  BP:  104/78  Pulse: 72   Resp: 16   Temp:    SpO2: 96%     Last Pain:  Vitals:   08/23/17 1410  TempSrc:   PainSc: 0-No pain                 Martha Clan

## 2017-08-23 NOTE — Op Note (Signed)
Operative note   Surgeon:Kino Dunsworth Lawyer: None    Preop diagnosis: 1.  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  Open reduction with internal fixation with intramedullary screw fifth metatarsal right foot 2.  Dorsiflex 3 first metatarsal base osteotomy right foot    EBL: Minimal    Anesthesia:local and regional.  Patient underwent popliteal block in preoperative holding area    Hemostasis: Thigh tourniquet inflated to 250 mmHg for approximately 100 minutes    Specimen: None    Complications: None    Operative indications:Anthony Fuller is an 57 y.o. that presents today for surgical intervention.  The risks/benefits/alternatives/complications have been discussed and consent has been given.    Procedure:  Patient was brought into the OR and placed on the operating table in thesupine position. After anesthesia was obtained theright lower extremity was prepped and draped in usual sterile fashion.  Initially attention was directed to the fifth metatarsal base where just proximal to the fifth metatarsal base a longitudinal incision was performed.  Sharp and blunt dissection carried down into the subcutaneous tissue.  Care was taken to retract all vital neurovascular structures.  The peroneal brevis tendon was noted and was superior to the incision site.  Blunt dissection was then taken to the base of the fifth metatarsal.  With a guidewire the guidewire was then placed from the fifth metatarsal base crossing the nonunion site into the shaft of the fifth metatarsal.  At this time good alignment was noted in all planes.  A 5.5 mm Paragon screw was placed with normal standard AO technique.  Good alignment was noted and positioning was noted.  The wound was then flushed with copious amounts of irrigation.  Closure was then performed with a 4-0 Vicryl for the  deeper and subtenons tissue and 3-0 nylon for the skin.  Attention was then directed to the dorsal aspect of the base of the first metatarsal where a dorsal incision was performed just medial to the long extensor tendon.  Sharp and blunt dissection carried down to the periosteum.  Subperiosteal dissection was then undertaken.  The first metatarsocuneiform joint was noted and approximately 1-1/2 cm distal to this the osteotomy was created.  A dorsal V osteotomy was created.  Good dorsiflexion of the first metatarsal was noted with excellent alignment and compression and fluoroscopy views.  Next 215 mm dorsal compression staples were applied with good stability noted and good compression.  Good dorsiflexion of the first metatarsal was noted especially on the lateral view with fluoroscopy.  The wound was then flushed with copious amounts of irrigation.  Closure was performed with a 4-0 Vicryl for the deeper and subtenons tissue and a 5-0 Monocryl for skin.  0.25% bupivacaine was placed around all surgical sites.  Patient was then placed in a well compressive sterile dressing and an equalizer walker boot was placed in the PACU.    Patient tolerated the procedure and anesthesia well.  Was transported from the OR to the PACU with all vital signs stable and vascular status intact. To be discharged per routine protocol.  Will follow up in approximately 1 week in the outpatient clinic.

## 2017-08-23 NOTE — Anesthesia Preprocedure Evaluation (Signed)
Anesthesia Evaluation  Patient identified by MRN, date of birth, ID band Patient awake    Reviewed: Allergy & Precautions, H&P , NPO status , Patient's Chart, lab work & pertinent test results, reviewed documented beta blocker date and time   History of Anesthesia Complications Negative for: history of anesthetic complications  Airway Mallampati: III  TM Distance: >3 FB Neck ROM: full    Dental  (+) Teeth Intact, Poor Dentition, Missing   Pulmonary neg pulmonary ROS,           Cardiovascular hypertension, (-) angina(-) CAD, (-) Past MI, (-) Cardiac Stents and (-) CABG (-) dysrhythmias (-) Valvular Problems/Murmurs     Neuro/Psych neg Seizures PSYCHIATRIC DISORDERS Anxiety  Neuromuscular disease    GI/Hepatic GERD  ,NAFLD   Endo/Other  diabetes  Renal/GU Renal disease  negative genitourinary   Musculoskeletal   Abdominal   Peds  Hematology negative hematology ROS (+)   Anesthesia Other Findings Past Medical History:   Arthritis                                                    GERD (gastroesophageal reflux disease)                       Hypertension                                                 Diabetes mellitus without complication ([place])                 Cancer ([place])                                    2012           Comment:R kidney removed   Renal insufficiency                                            Comment:Right Nephrectomy due to [place].   Anxiety                                                      Clostridium difficile infection                 02/04/2014    Fatty liver disease, nonalcoholic               11/11/6965     Hepatic cirrhosis ([place])                         09/12/2014     Diabetes mellitus ([place])                         07/24/2011    DM (diabetes mellitus) type 2, uncontrolled, w* 05/30/2015    Lumbar radiculopathy  09/05/2011    Thoracic and lumbosacral neuritis                07/24/2011    Bulge of lumbar disc without myelopathy         09/05/2011    Degeneration of intervertebral disc of lumbar * 08/23/2011    Enthesopathy of hip                             04/11/2015   Acid indigestion                                02/14/2012  Past Surgical History:   SPINE SURGERY                                                   Comment:lumbar 2013   kidney removed                                  Right                Comment:2012   BACK SURGERY                                                  NEPHRECTOMY                                     Right 04/2011      URETEROSCOPY WITH HOLMIUM LASER LITHOTRIPSY     Left 05/18/2015       Comment:Procedure: URETEROSCOPY WITH HOLMIUM LASER               LITHOTRIPSY;  Surgeon: Nickie Retort, MD;               Location: [facility];  Service: Urology;                Laterality: Left;   CYSTOSCOPY W/ RETROGRADES                       Left 05/18/2015       Comment:Procedure: CYSTOSCOPY WITH RETROGRADE               PYELOGRAM;  Surgeon: Nickie Retort, MD;                Location: [facility];  Service: Urology;                Laterality: Left;   CYSTOSCOPY WITH STENT PLACEMENT                 Left 05/18/2015       Comment:Procedure: CYSTOSCOPY WITH STENT PLACEMENT;                Surgeon: Nickie Retort, MD;  Location:               [facility];  Service: Urology;  Laterality: Left;   CARDIAC CATHETERIZATION  Right 06/20/2015       Comment:Procedure: Left Heart Cath and Coronary               Angiography;  Surgeon: Dionisio David, MD;                Location: [place] [facility];  Service:               Cardiovascular;  Laterality: Right; BMI    Body Mass Index   33.36 kg/m 2     Reproductive/Obstetrics negative OB ROS                             Anesthesia Physical  Anesthesia Plan  ASA: III  Anesthesia Plan: General   Post-op Pain Management:    Induction: Cricoid pressure  planned and Intravenous  PONV Risk Score and Plan: 2 and Ondansetron and Dexamethasone  Airway Management Planned: LMA  Additional Equipment:   Intra-op Plan:   Post-operative Plan: Extubation in OR  Informed Consent: I have reviewed the patients History and Physical, chart, labs and discussed the procedure including the risks, benefits and alternatives for the proposed anesthesia with the patient or authorized representative who has indicated his/her understanding and acceptance.   Dental Advisory Given  Plan Discussed with: CRNA  Anesthesia Plan Comments:         Anesthesia Quick Evaluation

## 2017-08-23 NOTE — Anesthesia Procedure Notes (Signed)
Procedure Name: LMA Insertion Date/Time: 08/23/2017 10:31 AM Performed by: Patience Musca., CRNA Pre-anesthesia Checklist: Patient identified, Patient being monitored, Timeout performed, Emergency Drugs available and Suction available Patient Re-evaluated:Patient Re-evaluated prior to induction Oxygen Delivery Method: Circle system utilized Preoxygenation: Pre-oxygenation with 100% oxygen Induction Type: IV induction Ventilation: Mask ventilation without difficulty LMA: LMA inserted LMA Size: 4.0 Tube type: Oral Number of attempts: 1 Placement Confirmation: positive ETCO2 and breath sounds checked- equal and bilateral Tube secured with: Tape Dental Injury: Teeth and Oropharynx as per pre-operative assessment

## 2017-08-23 NOTE — Discharge Instructions (Signed)
Elevate operative leg Reinforce dressing as needed Use crutches when walking No weight on right foot/leg  [place] [address]. [place]   1. Take your medication as prescribed.  Pain medication should be taken only as needed.  2. Keep the dressing clean, dry and intact.  3. Keep your foot elevated above the heart level for the first 48 hours.  4. Walking to the bathroom and brief periods of walking are acceptable, unless we have instructed you to be non-weight bearing.  5. Always wear your post-op shoe when walking.  Always use your crutches if you are to be non-weight bearing.  6. Do not take a shower. Baths are permissible as long as the foot is kept out of the water.   7. Every hour you are awake:  - Bend your knee 15 times. - Flex foot 15 times - Massage calf 15 times  8. Call [facility] [phone]) if any of the following problems occur: - You develop a temperature or fever. - The bandage becomes saturated with blood. - Medication does not stop your pain. - Injury of the foot occurs. - Any symptoms of infection including redness, odor, or red streaks running from wound.         AMBULATORY SURGERY  DISCHARGE INSTRUCTIONS   1) The drugs that you were given will stay in your system until tomorrow so for the next 24 hours you should not:  A) Drive an automobile B) Make any legal decisions C) Drink any alcoholic beverage   2) You may resume regular meals tomorrow.  Today it is better to start with liquids and gradually work up to solid foods.  You may eat anything you prefer, but it is better to start with liquids, then soup and crackers, and gradually work up to solid foods.   3) Please notify your doctor immediately if you have any unusual bleeding, trouble breathing, redness and pain at the surgery site, drainage, fever, or pain  not relieved by medication.    4) Additional Instructions:        Please contact your physician with any problems or Same Day Surgery at [phone], Monday through Friday 6 am to 4 pm, or [place] at [facility] number at [phone].

## 2017-08-23 NOTE — Anesthesia Procedure Notes (Signed)
Anesthesia Regional Block: Popliteal block   Pre-Anesthetic Checklist: ,, timeout performed, Correct Patient, Correct Site, Correct Laterality, Correct Procedure, Correct Position, site marked, Risks and benefits discussed,  Surgical consent,  Pre-op evaluation,  At surgeon's request and post-op pain management  Laterality: Lower and Right  Prep: chloraprep       Needles:  Injection technique: Single-shot  Needle Type: Echogenic Needle     Needle Length: 9cm  Needle Gauge: 21     Additional Needles:   Procedures:,,,, ultrasound used (permanent image in chart),,,,  Narrative:  Start time: 08/23/2017 10:14 AM End time: 08/23/2017 10:21 AM Injection made incrementally with aspirations every 5 mL.  Performed by: Personally  Anesthesiologist: Martha Clan, MD  Additional Notes: Functioning IV was confirmed and monitors were applied.  A echogenic needle was used. Sterile prep,hand hygiene and sterile gloves were used. Minimal sedation used for procedure.   No paresthesia endorsed by patient during the procedure.  Negative aspiration and negative test dose prior to incremental administration of local anesthetic. The patient tolerated the procedure well with no immediate complications.

## 2017-08-23 NOTE — Transfer of Care (Signed)
Immediate Anesthesia Transfer of Care Note  Patient: Anthony Fuller  Procedure(s) Performed: HALLUX VALGUS BASE WEDGE (Right Foot) OPEN REDUCTION INTERNAL FIXATION (ORIF) METATARSAL (TOE) FRACTURE (Right Foot)  Patient Location: PACU  Anesthesia Type:General  Level of Consciousness: drowsy and patient cooperative  Airway & Oxygen Therapy: Patient Spontanous Breathing  Post-op Assessment: Report given to RN  Post vital signs: Reviewed and stable  Last Vitals:  Vitals Value Taken Time  BP    Temp    Pulse    Resp    SpO2      Last Pain:  Vitals:   08/23/17 0920  TempSrc: Oral  PainSc: 5          Complications: No apparent anesthesia complications

## 2017-08-26 ENCOUNTER — Encounter: Payer: Self-pay | Admitting: Podiatry

## 2017-09-02 ENCOUNTER — Encounter: Payer: Self-pay | Admitting: Podiatry

## 2017-12-16 ENCOUNTER — Other Ambulatory Visit: Payer: Self-pay | Admitting: Pediatrics

## 2017-12-16 ENCOUNTER — Ambulatory Visit
Admission: RE | Admit: 2017-12-16 | Discharge: 2017-12-16 | Disposition: A | Discharge status: Home / Self Care | Payer: 59 | Source: Ambulatory Visit | Attending: Pediatrics | Admitting: Pediatrics

## 2017-12-16 DIAGNOSIS — R0600 Dyspnea, unspecified: Secondary | ICD-10-CM

## 2017-12-16 DIAGNOSIS — R0609 Other forms of dyspnea: Secondary | ICD-10-CM | POA: Diagnosis not present

## 2018-03-03 ENCOUNTER — Other Ambulatory Visit: Payer: Self-pay | Admitting: Student

## 2018-03-03 DIAGNOSIS — K9289 Other specified diseases of the digestive system: Secondary | ICD-10-CM

## 2018-03-03 DIAGNOSIS — R1013 Epigastric pain: Secondary | ICD-10-CM

## 2018-03-03 DIAGNOSIS — R197 Diarrhea, unspecified: Secondary | ICD-10-CM

## 2018-03-03 DIAGNOSIS — R109 Unspecified abdominal pain: Secondary | ICD-10-CM

## 2018-03-04 ENCOUNTER — Other Ambulatory Visit
Admission: RE | Admit: 2018-03-04 | Discharge: 2018-03-04 | Disposition: A | Discharge status: Home / Self Care | Payer: 59 | Source: Ambulatory Visit | Attending: Student | Admitting: Student

## 2018-03-04 DIAGNOSIS — R197 Diarrhea, unspecified: Secondary | ICD-10-CM | POA: Insufficient documentation

## 2018-03-04 LAB — GASTROINTESTINAL PANEL BY PCR, STOOL (REPLACES STOOL CULTURE)

## 2018-03-04 LAB — C DIFFICILE QUICK SCREEN W PCR REFLEX
C Diff antigen: NEGATIVE
C Diff interpretation: NOT DETECTED
C Diff toxin: NEGATIVE

## 2018-03-05 LAB — CALPROTECTIN, FECAL: Calprotectin, Fecal: 71 ug/g (ref 0–120)

## 2018-03-06 ENCOUNTER — Ambulatory Visit
Admission: RE | Admit: 2018-03-06 | Discharge: 2018-03-06 | Disposition: A | Discharge status: Home / Self Care | Payer: 59 | Source: Ambulatory Visit | Attending: Student | Admitting: Student

## 2018-03-06 DIAGNOSIS — R1013 Epigastric pain: Secondary | ICD-10-CM | POA: Diagnosis not present

## 2018-03-06 DIAGNOSIS — R140 Abdominal distension (gaseous): Secondary | ICD-10-CM | POA: Diagnosis not present

## 2018-03-06 DIAGNOSIS — R197 Diarrhea, unspecified: Secondary | ICD-10-CM | POA: Diagnosis present

## 2018-03-06 DIAGNOSIS — R109 Unspecified abdominal pain: Secondary | ICD-10-CM

## 2018-03-06 DIAGNOSIS — K760 Fatty (change of) liver, not elsewhere classified: Secondary | ICD-10-CM | POA: Diagnosis not present

## 2018-03-06 DIAGNOSIS — K9289 Other specified diseases of the digestive system: Secondary | ICD-10-CM | POA: Diagnosis present

## 2018-03-06 DIAGNOSIS — N200 Calculus of kidney: Secondary | ICD-10-CM | POA: Diagnosis not present

## 2018-03-06 LAB — PANCREATIC ELASTASE, FECAL: Pancreatic Elastase-1, Stool: >500 ug Elast./g (ref >200)

## 2018-03-14 ENCOUNTER — Ambulatory Visit
Admission: RE | Admit: 2018-03-14 | Discharge: 2018-03-14 | Disposition: A | Discharge status: Home / Self Care | Payer: 59 | Source: Ambulatory Visit | Attending: Unknown Physician Specialty | Admitting: Unknown Physician Specialty

## 2018-03-14 ENCOUNTER — Encounter: Payer: Self-pay | Admitting: *Deleted

## 2018-03-14 ENCOUNTER — Ambulatory Visit: Payer: 59 | Admitting: Certified Registered Nurse Anesthetist

## 2018-03-14 ENCOUNTER — Encounter
Admission: RE | Disposition: A | Discharge status: Home / Self Care | Payer: Self-pay | Source: Ambulatory Visit | Attending: Unknown Physician Specialty

## 2018-03-14 DIAGNOSIS — R948 Abnormal results of function studies of other organs and systems: Secondary | ICD-10-CM | POA: Diagnosis not present

## 2018-03-14 DIAGNOSIS — K589 Irritable bowel syndrome without diarrhea: Secondary | ICD-10-CM | POA: Diagnosis not present

## 2018-03-14 DIAGNOSIS — R109 Unspecified abdominal pain: Secondary | ICD-10-CM | POA: Diagnosis not present

## 2018-03-14 DIAGNOSIS — E1142 Type 2 diabetes mellitus with diabetic polyneuropathy: Secondary | ICD-10-CM | POA: Insufficient documentation

## 2018-03-14 DIAGNOSIS — G4730 Sleep apnea, unspecified: Secondary | ICD-10-CM | POA: Diagnosis not present

## 2018-03-14 DIAGNOSIS — M48061 Spinal stenosis, lumbar region without neurogenic claudication: Secondary | ICD-10-CM | POA: Insufficient documentation

## 2018-03-14 DIAGNOSIS — K219 Gastro-esophageal reflux disease without esophagitis: Secondary | ICD-10-CM | POA: Insufficient documentation

## 2018-03-14 DIAGNOSIS — I10 Essential (primary) hypertension: Secondary | ICD-10-CM | POA: Diagnosis not present

## 2018-03-14 DIAGNOSIS — M5136 Other intervertebral disc degeneration, lumbar region: Secondary | ICD-10-CM | POA: Diagnosis not present

## 2018-03-14 DIAGNOSIS — K7460 Unspecified cirrhosis of liver: Secondary | ICD-10-CM | POA: Diagnosis not present

## 2018-03-14 DIAGNOSIS — Z905 Acquired absence of kidney: Secondary | ICD-10-CM | POA: Insufficient documentation

## 2018-03-14 DIAGNOSIS — F419 Anxiety disorder, unspecified: Secondary | ICD-10-CM | POA: Insufficient documentation

## 2018-03-14 DIAGNOSIS — Z7984 Long term (current) use of oral hypoglycemic drugs: Secondary | ICD-10-CM | POA: Insufficient documentation

## 2018-03-14 HISTORY — DX: Headache, unspecified: R51.9

## 2018-03-14 HISTORY — DX: Headache: R51

## 2018-03-14 HISTORY — DX: Thrombocytopenia, unspecified: D69.6

## 2018-03-14 HISTORY — DX: Sleep apnea, unspecified: G47.30

## 2018-03-14 HISTORY — DX: Strain of muscle(s) and tendon(s) of peroneal muscle group at lower leg level, unspecified leg, initial encounter: S86.319A

## 2018-03-14 HISTORY — DX: Spinal stenosis, lumbar region without neurogenic claudication: M48.061

## 2018-03-14 HISTORY — DX: Epigastric pain: R10.13

## 2018-03-14 HISTORY — DX: Spinal stenosis, cervical region: M48.02

## 2018-03-14 HISTORY — DX: Irritable bowel syndrome, unspecified: K58.9

## 2018-03-14 HISTORY — PX: ESOPHAGOGASTRODUODENOSCOPY ENDOSCOPY: SHX5814

## 2018-03-14 LAB — GLUCOSE, CAPILLARY: Glucose-Capillary: 185 mg/dL — ABNORMAL HIGH (ref 70–99)

## 2018-03-14 SURGERY — ESOPHAGOGASTRODUODENOSCOPY ENDOSCOPY
Anesthesia: General

## 2018-03-14 MED ORDER — SODIUM CHLORIDE 0.9 % IV SOLN: INTRAVENOUS | Status: DC

## 2018-03-14 MED ORDER — LIDOCAINE HCL (CARDIAC) PF 100 MG/5ML IV SOSY
PREFILLED_SYRINGE | INTRAVENOUS | Status: DC | PRN
  Administered 2018-03-14: 50 mg via INTRAVENOUS

## 2018-03-14 MED ORDER — PROPOFOL 500 MG/50ML IV EMUL
INTRAVENOUS | Status: DC | PRN
  Administered 2018-03-14: 85 ug/kg/min via INTRAVENOUS

## 2018-03-14 MED ORDER — PIPERACILLIN-TAZOBACTAM 3.375 G IVPB
3.3750 g | Freq: Once | INTRAVENOUS | Status: AC
  Administered 2018-03-14: 3.375 g via INTRAVENOUS

## 2018-03-14 MED ORDER — PROPOFOL 500 MG/50ML IV EMUL
INTRAVENOUS | Status: AC
  Filled 2018-03-14: qty 50

## 2018-03-14 MED ORDER — PIPERACILLIN-TAZOBACTAM 3.375 G IVPB
INTRAVENOUS | Status: AC
  Administered 2018-03-14: 3.375 g via INTRAVENOUS
  Filled 2018-03-14: qty 50

## 2018-03-14 MED ORDER — PROPOFOL 10 MG/ML IV BOLUS
INTRAVENOUS | Status: AC
  Filled 2018-03-14: qty 20

## 2018-03-14 MED ORDER — SODIUM CHLORIDE 0.9 % IV SOLN
INTRAVENOUS | Status: DC
  Administered 2018-03-14 (×2): via INTRAVENOUS

## 2018-03-14 MED ORDER — PROPOFOL 10 MG/ML IV BOLUS
INTRAVENOUS | Status: DC | PRN
  Administered 2018-03-14 (×2): 40 mg via INTRAVENOUS

## 2018-03-14 NOTE — Anesthesia Post-op Follow-up Note (Signed)
Anesthesia QCDR form completed.        

## 2018-03-14 NOTE — Transfer of Care (Signed)
Immediate Anesthesia Transfer of Care Note  Patient: Anthony Fuller  Procedure(s) Performed: ESOPHAGOGASTRODUODENOSCOPY ENDOSCOPY (N/A )  Patient Location: PACU  Anesthesia Type:General  Level of Consciousness: awake, alert  and oriented  Airway & Oxygen Therapy: Patient Spontanous Breathing and Patient connected to nasal cannula oxygen  Post-op Assessment: Report given to RN and Post -op Vital signs reviewed and stable  Post vital signs: stable  Last Vitals:  Vitals Value Taken Time  BP 100/58 03/14/2018 10:01 AM  Temp    Pulse 77 03/14/2018 10:01 AM  Resp 5 03/14/2018 10:01 AM  SpO2 97 % 03/14/2018 10:01 AM  Vitals shown include unvalidated device data.  Last Pain:  Vitals:   03/14/18 0840  TempSrc: Tympanic         Complications: No apparent anesthesia complications

## 2018-03-14 NOTE — Anesthesia Postprocedure Evaluation (Signed)
Anesthesia Post Note  Patient: Anthony Fuller  Procedure(s) Performed: ESOPHAGOGASTRODUODENOSCOPY ENDOSCOPY (N/A )  Patient location during evaluation: Endoscopy Anesthesia Type: General Level of consciousness: awake and alert Pain management: pain level controlled Vital Signs Assessment: post-procedure vital signs reviewed and stable Respiratory status: spontaneous breathing and respiratory function stable Cardiovascular status: stable Anesthetic complications: no     Last Vitals:  Vitals:   03/14/18 0840 03/14/18 1000  BP: 128/89 (!) 100/58  Pulse: 78 76  Resp: 20 (!) 5  Temp: (!) 35.9 C (!) 36.1 C  SpO2: 96% 96%    Last Pain:  Vitals:   03/14/18 0840  TempSrc: Tympanic                 KEPHART,WILLIAM K

## 2018-03-14 NOTE — H&P (Signed)
Primary Care Physician:  Valera Castle, MD Primary Gastroenterologist:  Dr. Vira Agar  Pre-Procedure History & Physical: HPI:  Anthony Fuller is a 57 y.o. male is here for an endoscopy.   Past Medical History:  Diagnosis Date  . Acid indigestion 02/14/2012  . Anxiety   . Arthritis    BIL.KNEES  . 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 Cancer ([place]) 2012   R kidney removed  . Cervical stenosis of spine   . Cervicalgia   . Chest wall pain   . Clostridium difficile infection 02/04/2014  . DDD (degenerative disc disease), lumbar   . DDD (degenerative disc disease), lumbar   . Degeneration of intervertebral disc of lumbar region 08/23/2011  . Diabetes mellitus ([place]) 07/24/2011  . Diabetes mellitus without complication ([place])   . Diabetic polyneuropathy associated with type 2 diabetes mellitus ([place])   . Diarrhea   . DM (diabetes mellitus) type 2, uncontrolled, with ketoacidosis ([place]) 05/30/2015  . Dyspepsia   . Enthesopathy of hip 04/11/2015  . Fatty liver disease, nonalcoholic 01/18/262  . GERD (gastroesophageal reflux disease)   . Headache   . Hepatic cirrhosis ([place]) 09/12/2014  . Hydrocele   . Hydrocele   . Hypertension   . IBS (irritable bowel syndrome)   . Lumbar herniated disc   . Lumbar radiculopathy 09/05/2011  . Lumbar stenosis   . Peroneal tendon injury, right, initial encounter   . Peroneal tendon tear   . Radiculopathy   . 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  Marland Kitchen Sleep apnea    snores  . Stenosis of cervical spine   . Thoracic and lumbosacral neuritis 07/24/2011  . Thrombocytopathia ([place])   . Thrombocytopenia ([place])     Past Surgical History:  Procedure Laterality Date  . BACK SURGERY     cervical fusion C5-7  . CARDIAC CATHETERIZATION Right 06/20/2015   Procedure: Left Heart Cath and Coronary Angiography;  Surgeon: Dionisio David, MD;  Location: [facility];  Service: Cardiovascular;  Laterality: Right;  . COLONOSCOPY     . CYSTOSCOPY W/ RETROGRADES Left 05/18/2015   Procedure: CYSTOSCOPY WITH RETROGRADE PYELOGRAM;  Surgeon: Nickie Retort, MD;  Location: [facility];  Service: Urology;  Laterality: Left;  . CYSTOSCOPY WITH STENT PLACEMENT Left 05/18/2015   Procedure: CYSTOSCOPY WITH STENT PLACEMENT;  Surgeon: Nickie Retort, MD;  Location: [facility];  Service: Urology;  Laterality: Left;  . CYSTOSCOPY WITH STENT PLACEMENT Left 06/28/2015   Procedure: CYSTOSCOPY WITH STENT PLACEMENT/ EXCHANGE;  Surgeon: Hollice Espy, MD;  Location: [facility];  Service: Urology;  Laterality: Left;  . ESOPHAGOGASTRODUODENOSCOPY (EGD) WITH PROPOFOL N/A 09/03/2016   Procedure: ESOPHAGOGASTRODUODENOSCOPY (EGD) WITH PROPOFOL;  Surgeon: Manya Silvas, MD;  Location: [facility];  Service: Endoscopy;  Laterality: N/A;  . HALLUX VALGUS BASE WEDGE Right 08/23/2017   Procedure: HALLUX VALGUS BASE WEDGE;  Surgeon: Samara Deist, DPM;  Location: [facility];  Service: Podiatry;  Laterality: Right;  . JOINT REPLACEMENT     right knee   . kidney removed Right    2012  . NAFLD    . NEPHRECTOMY Right 04/2011  . ORIF TOE FRACTURE Right 08/23/2017   Procedure: OPEN REDUCTION INTERNAL FIXATION (ORIF) METATARSAL (TOE) FRACTURE;  Surgeon: Samara Deist, DPM;  Location: [facility];  Service: Podiatry;  Laterality: Right;  . SPINE SURGERY     lumbar 2013  . URETEROSCOPY WITH HOLMIUM LASER LITHOTRIPSY Left 05/18/2015   Procedure: URETEROSCOPY WITH  HOLMIUM LASER LITHOTRIPSY;  Surgeon: Nickie Retort, MD;  Location: [facility];  Service: Urology;  Laterality: Left;  . URETEROSCOPY WITH HOLMIUM LASER LITHOTRIPSY Left 06/28/2015   Procedure: URETEROSCOPY WITH HOLMIUM LASER LITHOTRIPSY;  Surgeon: Hollice Espy, MD;  Location: [facility];  Service: Urology;  Laterality: Left;    Prior to Admission medications   Medication Sig Start Date End Date Taking? Authorizing Provider  cetirizine (ZYRTEC) 10 MG tablet Take 10 mg by mouth daily.   Yes [provider]  dicyclomine (BENTYL) 10 MG capsule Take 10 mg by mouth at bedtime.    Yes [provider]  empagliflozin (JARDIANCE) 10 MG TABS tablet Take 10 mg by mouth at bedtime.    Yes [provider]  liraglutide (VICTOZA) 18 MG/3ML SOPN Inject 1.8 mg into the skin at bedtime.    Yes [provider]  metFORMIN (GLUCOPHAGE-XR) 500 MG 24 hr tablet TAKE 2 TABLETS (1,000 MG TOTAL) BY MOUTH DAILY WITH DINNER. 12/05/15  Yes [provider]  metoCLOPramide (REGLAN) 10 MG tablet Take 10 mg by mouth 3 (three) times daily. 07/15/17  Yes [provider]  Omeprazole 20 MG TBEC Take 20 mg by mouth every evening.    Yes [provider]  pregabalin (LYRICA) 200 MG capsule Take 200 mg by mouth Nightly.   Yes [provider]  ranitidine (ZANTAC) 150 MG capsule Take 150 mg by mouth every evening.    Yes [provider]  tamsulosin (FLOMAX) 0.4 MG CAPS capsule Take 0.4 mg by mouth at bedtime.    Yes [provider]  tiZANidine (ZANAFLEX) 4 MG capsule Take 4 mg by mouth 3 (three) times daily.   Yes [provider]  traMADol (ULTRAM) 50 MG tablet Take 50 mg by mouth every 6 (six) hours as needed for moderate pain. for pain   Yes [provider]  oxyCODONE-acetaminophen (PERCOCET) 5-325 MG tablet Take 1-2 tablets by mouth every 6 (six) hours as needed for severe pain. Patient not taking: Reported on 03/14/2018 08/23/17   Samara Deist, DPM    Allergies as of 03/13/2018 - Review Complete 03/13/2018  Allergen Reaction Noted  . Hydromorphone Other (See Comments) 09/20/2014  . Aspirin  02/27/2016  . Floxin [ofloxacin] Rash 09/20/2014    Family History  Problem Relation Age of Onset  . Diabetes Mother   . Hypertension Mother   . Asthma Mother   . Heart disease Father   . Kidney disease Father   . Diabetes Father   . Stroke Father     Social History   Socioeconomic History  . Marital status: Married     Spouse name: Not on file  . Number of children: Not on file  . Years of education: Not on file  . Highest education level: Not on file  Occupational History  . Not on file  Social Needs  . Financial resource strain: Not on file  . Food insecurity:    Worry: Not on file    Inability: Not on file  . Transportation needs:    Medical: Not on file    Non-medical: Not on file  Tobacco Use  . Smoking status: Never Smoker  . Smokeless tobacco: Never Used  Substance and Sexual Activity  . Alcohol use: No  . Drug use: No  . Sexual activity: Yes  Lifestyle  . Physical activity:    Days per week: Not on file    Minutes per session: Not on file  . Stress:  Not on file  Relationships  . Social connections:    Talks on phone: Not on file    Gets together: Not on file    Attends religious service: Not on file    Active member of club or organization: Not on file    Attends meetings of clubs or organizations: Not on file    Relationship status: Not on file  . Intimate partner violence:    Fear of current or ex partner: Not on file    Emotionally abused: Not on file    Physically abused: Not on file    Forced sexual activity: Not on file  Other Topics Concern  . Not on file  Social History Narrative  . Not on file    Review of Systems: See HPI, otherwise negative ROS  Physical Exam: BP 128/89   Pulse 78   Temp (!) 96.7 F (35.9 C) (Tympanic)   Resp 20   Wt 128.4 kg   SpO2 96%   BMI 36.34 kg/m  General:   Alert,  pleasant and cooperative in NAD Head:  Normocephalic and atraumatic. Neck:  Supple; no masses or thyromegaly. Lungs:  Clear throughout to auscultation.    Heart:  Regular rate and rhythm. Abdomen:  Soft, nontender and nondistended. Normal bowel sounds, without guarding, and without rebound.   Neurologic:  Alert and  oriented x4;  grossly normal neurologically.  Impression/Plan: CORTLIN MARANO is here for an endoscopy to be performed for abnormal CT scan  showing a mass or a glob of food.  Risks, benefits, limitations, and alternatives regarding  endoscopy have been reviewed with the patient.  Questions have been answered.  All parties agreeable.   Gaylyn Cheers, MD  03/14/2018, 9:41 AM

## 2018-03-14 NOTE — Op Note (Addendum)
[facility] Patient Name: Anthony Fuller Procedure Date: 03/14/2018 9:24 AM MRN: [id number] Account #: [id number] Date of Birth: [birth year] Admit Type: Outpatient Age: 57 Room: [facility] ENDO ROOM 1 Gender: Male Note Status: Finalized Procedure:            Upper GI endoscopy Indications:          abdominal pain, abnormal CT scan                       Generalized abdominal pain, Abnormal CT of the GI tract Providers:            Manya Silvas, MD Referring MD:         Valera Castle (Referring MD) Medicines:            Propofol per Anesthesia Complications:        No immediate complications. Procedure:            Pre-Anesthesia Assessment:                       - After reviewing the risks and benefits, the patient                        was deemed in satisfactory condition to undergo the                        procedure.                       The Endoscope was introduced through the mouth, and                        advanced to the second part of duodenum. The upper GI                        endoscopy was accomplished without difficulty. The                        patient tolerated the procedure well. Findings:      The examined esophagus was normal. GEJ 43cm.      A small-medium amount of food (residue) was found in the gastric body.       No ulcers, no tumors, no inflammation. I bypassed the food and examined       the antrum and duodenum.      The examined duodenum was normal. Impression:           - Normal esophagus.                       - A small amount of food (residue) in the stomach.                       - Normal examined duodenum.                       - No specimens collected. Recommendation:       - The findings and recommendations were discussed with                        the patient's family. Manya Silvas, MD 03/14/2018 10:16:32 AM This report has been signed electronically. Number of Addenda: 0  Note Initiated On:  03/14/2018 9:24 AM      [facility]

## 2018-03-14 NOTE — Anesthesia Preprocedure Evaluation (Signed)
Anesthesia Evaluation  Patient identified by MRN, date of birth, ID band Patient awake    Reviewed: Allergy & Precautions, NPO status , Patient's Chart, lab work & pertinent test results  History of Anesthesia Complications Negative for: history of anesthetic complications  Airway Mallampati: III       Dental   Pulmonary neg sleep apnea, neg COPD,           Cardiovascular hypertension, Pt. on medications (-) Past MI and (-) CHF (-) dysrhythmias (-) Valvular Problems/Murmurs     Neuro/Psych neg Seizures Anxiety    GI/Hepatic Neg liver ROS, GERD  Medicated,  Endo/Other  diabetes, Type 2, Oral Hypoglycemic Agents  Renal/GU Renal InsufficiencyRenal disease (s/p nephrectomy for CA)     Musculoskeletal   Abdominal   Peds  Hematology   Anesthesia Other Findings   Reproductive/Obstetrics                             Anesthesia Physical Anesthesia Plan  ASA: III  Anesthesia Plan: General   Post-op Pain Management:    Induction: Intravenous  PONV Risk Score and Plan: 2 and Propofol infusion and TIVA  Airway Management Planned: Nasal Cannula  Additional Equipment:   Intra-op Plan:   Post-operative Plan:   Informed Consent: I have reviewed the patients History and Physical, chart, labs and discussed the procedure including the risks, benefits and alternatives for the proposed anesthesia with the patient or authorized representative who has indicated his/her understanding and acceptance.     Plan Discussed with:   Anesthesia Plan Comments:         Anesthesia Quick Evaluation

## 2018-03-17 ENCOUNTER — Encounter: Payer: Self-pay | Admitting: Unknown Physician Specialty

## 2018-07-01 DIAGNOSIS — E1165 Type 2 diabetes mellitus with hyperglycemia: Secondary | ICD-10-CM | POA: Insufficient documentation

## 2018-12-25 ENCOUNTER — Other Ambulatory Visit: Payer: Self-pay

## 2018-12-25 ENCOUNTER — Encounter: Payer: Self-pay | Admitting: Student in an Organized Health Care Education/Training Program

## 2018-12-25 ENCOUNTER — Ambulatory Visit
Payer: PRIVATE HEALTH INSURANCE | Attending: Student in an Organized Health Care Education/Training Program | Admitting: Student in an Organized Health Care Education/Training Program

## 2018-12-25 VITALS — BP 128/87 | HR 66 | Temp 98.4°F | Resp 16 | Ht 74.0 in | Wt 275.0 lb

## 2018-12-25 DIAGNOSIS — M5416 Radiculopathy, lumbar region: Secondary | ICD-10-CM | POA: Insufficient documentation

## 2018-12-25 DIAGNOSIS — G9050 Complex regional pain syndrome I, unspecified: Secondary | ICD-10-CM | POA: Diagnosis present

## 2018-12-25 DIAGNOSIS — M47818 Spondylosis without myelopathy or radiculopathy, sacral and sacrococcygeal region: Secondary | ICD-10-CM | POA: Diagnosis present

## 2018-12-25 DIAGNOSIS — G8929 Other chronic pain: Secondary | ICD-10-CM | POA: Insufficient documentation

## 2018-12-25 DIAGNOSIS — G894 Chronic pain syndrome: Secondary | ICD-10-CM | POA: Insufficient documentation

## 2018-12-25 DIAGNOSIS — M5136 Other intervertebral disc degeneration, lumbar region: Secondary | ICD-10-CM | POA: Insufficient documentation

## 2018-12-25 DIAGNOSIS — M533 Sacrococcygeal disorders, not elsewhere classified: Secondary | ICD-10-CM | POA: Insufficient documentation

## 2018-12-25 NOTE — Progress Notes (Signed)
Patient's Name: Anthony Fuller  MRN: [id number]  Referring Provider: Valera Castle, *  DOB: [birth year]  PCP: Valera Castle, MD  DOS: 12/25/2018  Note by: Gillis Santa, MD  Service setting: Ambulatory outpatient  Specialty: Interventional Pain Management  Location: [facility] (AMB) Pain Management Facility  Visit type: Initial Patient Evaluation  Patient type: New Patient   Primary Reason(s) for Visit: Encounter for initial evaluation of one or more chronic problems (new to examiner) potentially causing chronic pain, and posing a threat to normal musculoskeletal function. (Level of risk: High) CC: Back Pain (lower) and Toe Pain (right, first 2 toes)  HPI  Anthony Fuller is a 58 y.o. year old, male patient, who comes today to see [place] for the first time for an initial evaluation of his chronic pain. He has Cervical pain; Hepatic cirrhosis ([place]); Clostridium difficile infection; Degeneration of intervertebral disc of lumbar region; D (diarrhea); Acid indigestion; Enthesopathy of hip; LBP (low back pain); Bulge of lumbar disc without myelopathy; Lumbar canal stenosis; Fatty liver disease, nonalcoholic; Arthralgia of hip; H/O urinary disorder; Lumbar radiculopathy; Congenital renal agenesis and dysgenesis; Thoracic and lumbosacral neuritis; Diabetes mellitus ([place]); Acute pyelonephritis; Pain due to ureteral stent ([place]); Left sided abdominal pain; Chest pain; CKD (chronic kidney disease), stage II; Hyponatremia; Leukocytosis; DM (diabetes mellitus) type 2, uncontrolled, with ketoacidosis ([place]); Solitary kidney; Sepsis ([place]); and Cervical spinal stenosis on their problem list. Today he comes in for evaluation of his Back Pain (lower) and Toe Pain (right, first 2 toes)  Pain Assessment: Location: Right Other (Comment)(toes) Radiating: denies Onset: More than a month ago Duration: Chronic pain Quality: Burning, Throbbing, Sharp Severity: 8 /10 (subjective, self-reported pain score)  Note: Reported  level is inconsistent with clinical observations.                         When using our objective Pain Scale, levels between 6 and 10/10 are said to belong in an emergency room, as it progressively worsens from a 6/10, described as severely limiting, requiring emergency care not usually available at an outpatient pain management facility. At a 6/10 level, communication becomes difficult and requires great effort. Assistance to reach the emergency department may be required. Facial flushing and profuse sweating along with potentially dangerous increases in heart rate and blood pressure will be evident. Effect on ADL: difficulty wearing shoes Timing: Constant Modifying factors: not wearing shoes BP: 128/87  HR: 66  Onset and Duration: Sudden and Gradual Cause of pain: Unknown Severity: Getting worse, NAS-11 at its worse: 10/10, NAS-11 at its best: 7/10, NAS-11 now: 7/10 and NAS-11 on the average: 7/10 Timing: Not influenced by the time of the day Aggravating Factors: Bending, Climbing, Kneeling, Lifiting, Motion, Prolonged sitting, Prolonged standing, Squatting, Stooping , Twisting, Walking, Walking uphill, Walking downhill and Working Alleviating Factors: Cold packs, Hot packs, Medications, Resting and Warm showers or baths Associated Problems: Depression, Dizziness, Fatigue, Inability to concentrate, Numbness, Personality changes, Sadness, Spasms, Sweating, Swelling, Tingling, Weakness and Pain that does not allow patient to sleep Quality of Pain: Aching, Agonizing, Annoying, Constant, Deep, Fearful, Nagging and Pressure-like Previous Examinations or Tests: CT scan, EMG/PNCV, MRI scan, Nerve block, Nerve conduction test, Neurological evaluation, Neurosurgical evaluation, Orthopedic evaluation and Chiropractic evaluation Previous Treatments: Biofeedback, Facet blocks, Narcotic medications, Physical Therapy, Spinal cord stimulator and TENS  The patient comes into the clinics today for the first time  for a chronic pain management evaluation.   58 year old male who presents with  a chief complaint of right foot and ankle pain as well as low back pain.  Patient has a diagnosis of right lower extremity/foot RSD.  Patient has a history of right toe fracture followed by open reduction internal fixation on 08/23/2017.  He states that his pain, discoloration, coolness to touch, swelling, lack of hair growth which is all consistent with [place] criteria for complex regional pain syndrome, started then.  Patient also has history of nephrectomy due to renal cancer in the past (06/29/2013.  The patient avoids NSAIDs as a result.  Of note patient was previously seen by [facility] interventional pain medicine and had a series of procedures done there.  For his right foot pain, the patient has undergone a series of 2 lumbar sympathetic nerve blocks at right L2 and L4 without any improvement in his pain.  [facility] pain medicine did not recommend repeating any additional.  Patient has also undergone spinal cord stimulator trial which was not effective.  He has undergone lumbar facet steroid injections as well as transforaminal epidural steroid injections at L3-L4 and L4-L5 without any significant benefit.  Patient is also had prior right knee arthroscopy.  Patient states that none of these injections were helpful.  I do not have any new interventional therapies to offer this patient since he is already tried and failed diagnostic lumbar sympathetic block for his right lower extremity complex regional pain syndrome as well as diagnostic facet blocks and transforaminal epidural steroid injections for his low back pain related to lumbar facet arthropathy and chronic lumbar radiculopathy.  Patient does endorse bilateral hip pain and SI joint pain with occasional radiation into his groin.  Can consider bilateral sacroiliac joint injection we will focus primarily on medication management.  Patient takes tramadol 50 mg 3 times daily which he  states does not really help with his pain.  He is also on tizanidine and gabapentin.  He has tried Lyrica in the past which he states was not effective.  Patient also takes Cymbalta 60 mg daily.  He is a type II diabetic on insulin.    Today I took the time to provide the patient with information regarding my pain practice. The patient was informed that my practice is divided into two sections: an interventional pain management section, as well as a completely separate and distinct medication management section. I explained that I have procedure days for my interventional therapies, and evaluation days for follow-ups and medication management. Because of the amount of documentation required during both, they are kept separated. This means that there is the possibility that he may be scheduled for a procedure on one day, and medication management the next. I have also informed him that because of staffing and facility limitations, I no longer take patients for medication management only. To illustrate the reasons for this, I gave the patient the example of surgeons, and how inappropriate it would be to refer a patient to his/her care, just to write for the post-surgical antibiotics on a surgery done by a different surgeon.   Because interventional pain management is my board-certified specialty, the patient was informed that joining my practice means that they are open to any and all interventional therapies. I made it clear that this does not mean that they will be forced to have any procedures done. What this means is that I believe interventional therapies to be essential part of the diagnosis and proper management of chronic pain conditions. Therefore, patients not interested in these interventional alternatives will  be better served under the care of a different practitioner.  The patient was also made aware of my Comprehensive Pain Management Safety Guidelines where by joining my practice, they limit all  of their nerve blocks and joint injections to those done by our practice, for as long as we are retained to manage their care.   Historic Controlled Substance Pharmacotherapy Review   10/17/2018  1   10/17/2018  Tramadol Hcl 50 MG Tablet  21.00 7 Jo Min   [id number]   [place] ([id number])   0  15.00 MME  Comm Ins   [place]   Medications: The patient did not bring the medication(s) to the appointment, as requested in our "New Patient Package" Pharmacodynamics: Desired effects: Analgesia: The patient reports 50% benefit. Reported improvement in function: The patient reports medication allows him to accomplish basic ADLs. Clinically meaningful improvement in function (CMIF): Sustained CMIF goals met Perceived effectiveness: Described as relatively effective but with some room for improvement Undesirable effects: Side-effects or Adverse reactions: None reported Historical Monitoring: The patient  reports no history of drug use. List of all UDS Test(s): No results found for: MDMA, COCAINSCRNUR, [place], [place], CANNABQUANT, THCU, [place] List of other Serum/Urine Drug Screening Test(s):  No results found for: AMPHSCRSER, BARBSCRSER, BENZOSCRSER, COCAINSCRSER, COCAINSCRNUR, PCPSCRSER, PCPQUANT, THCSCRSER, THCU, CANNABQUANT, OPIATESCRSER, OXYSCRSER, PROPOXSCRSER, ETH Historical Background Evaluation: [place] PMP: PDMP reviewed during this encounter. Six (6) year initial data search conducted.              Department of public safety, offender search: Editor, commissioning Information) Non-contributory Risk Assessment Profile: Aberrant behavior: None observed or detected today Risk factors for fatal opioid overdose: None identified today Fatal overdose hazard ratio (HR): Calculation deferred Non-fatal overdose hazard ratio (HR): Calculation deferred Risk of opioid abuse or dependence: 0.7-3.0% with doses ? 36 MME/day and 6.1-26% with doses ? 120 MME/day. Substance use disorder (SUD) risk level: See below Personal History of  Substance Abuse (SUD-Substance use disorder):  Alcohol: Negative  Illegal Drugs: Negative  Rx Drugs: Negative  ORT Risk Level calculation: Low Risk Opioid Risk Tool - 12/25/18 1059      Family History of Substance Abuse   Alcohol  Negative    Illegal Drugs  Negative    Rx Drugs  Negative      Personal History of Substance Abuse   Alcohol  Negative    Illegal Drugs  Negative    Rx Drugs  Negative      Age   Age between 81-45 years   No      History of Preadolescent Sexual Abuse   History of Preadolescent Sexual Abuse  Negative or Male      Psychological Disease   Psychological Disease  Negative    Depression  Negative      Total Score   Opioid Risk Tool Scoring  0    Opioid Risk Interpretation  Low Risk      ORT Scoring interpretation table:  Score <3 = Low Risk for SUD  Score between 4-7 = Moderate Risk for SUD  Score >8 = High Risk for Opioid Abuse   PHQ-2 Depression Scale:  Total score: 0  PHQ-2 Scoring interpretation table: (Score and probability of major depressive disorder)  Score 0 = No depression  Score 1 = 15.4% Probability  Score 2 = 21.1% Probability  Score 3 = 38.4% Probability  Score 4 = 45.5% Probability  Score 5 = 56.4% Probability  Score 6 = 78.6% Probability   PHQ-9 Depression Scale:  Total score: 0  PHQ-9 Scoring interpretation table:  Score 0-4 = No depression  Score 5-9 = Mild depression  Score 10-14 = Moderate depression  Score 15-19 = Moderately severe depression  Score 20-27 = Severe depression (2.4 times higher risk of SUD and 2.89 times higher risk of overuse)   Pharmacologic Plan: As per protocol, I have not taken over any controlled substance management, pending the results of ordered tests and/or consults.            Initial impression: Pending review of available data and ordered tests.  Meds   Current Outpatient Medications:  .  cetirizine (ZYRTEC) 10 MG tablet, Take 10 mg by mouth daily., Disp: , Rfl:  .  clotrimazole  (LOTRIMIN) 1 % cream, Apply 1 application topically 2 (two) times daily. As needed, Disp: , Rfl:  .  dicyclomine (BENTYL) 10 MG capsule, Take 30 mg by mouth at bedtime. , Disp: , Rfl:  .  DULoxetine (CYMBALTA) 30 MG capsule, Take 30 mg by mouth daily. 2 capsules daily, Disp: , Rfl:  .  gabapentin (NEURONTIN) 400 MG capsule, Take 400 mg by mouth 3 (three) times daily., Disp: , Rfl:  .  insulin NPH-regular Human (70-30) 100 UNIT/ML injection, Inject into the skin. 20 units in am,10 units at dinner, Disp: , Rfl:  .  lidocaine (XYLOCAINE) 5 % ointment, Apply 1 application topically as needed., Disp: , Rfl:  .  liraglutide (VICTOZA) 18 MG/3ML SOPN, Inject 1.8 mg into the skin at bedtime. , Disp: , Rfl:  .  metFORMIN (GLUCOPHAGE-XR) 500 MG 24 hr tablet, TAKE 2 TABLETS (1,000 MG TOTAL) BY MOUTH DAILY WITH DINNER., Disp: , Rfl:  .  Omeprazole 20 MG TBEC, Take 20 mg by mouth every evening. , Disp: , Rfl:  .  tamsulosin (FLOMAX) 0.4 MG CAPS capsule, Take 0.4 mg by mouth at bedtime. , Disp: , Rfl:  .  tiZANidine (ZANAFLEX) 4 MG capsule, Take 4 mg by mouth 3 (three) times daily., Disp: , Rfl:  .  traMADol (ULTRAM) 50 MG tablet, Take 50 mg by mouth every 6 (six) hours as needed for moderate pain. for pain, Disp: , Rfl: 1 .  empagliflozin (JARDIANCE) 10 MG TABS tablet, Take 10 mg by mouth at bedtime. , Disp: , Rfl:  .  metoCLOPramide (REGLAN) 10 MG tablet, Take 10 mg by mouth 3 (three) times daily., Disp: , Rfl: 0 .  oxyCODONE-acetaminophen (PERCOCET) 5-325 MG tablet, Take 1-2 tablets by mouth every 6 (six) hours as needed for severe pain. (Patient not taking: Reported on 03/14/2018), Disp: 30 tablet, Rfl: 0 .  ranitidine (ZANTAC) 150 MG capsule, Take 150 mg by mouth every evening. , Disp: , Rfl:   Imaging Review  Cervical Imaging: Cervical MR wo contrast:  Results for orders placed during the hospital encounter of 06/16/15  MR Cervical Spine Wo Contrast   Narrative CLINICAL DATA:  Neck pain radiating to both  shoulders and down the left arm for the past 2 months.  EXAM: MRI CERVICAL SPINE WITHOUT CONTRAST  TECHNIQUE: Multiplanar, multisequence MR imaging of the cervical spine was performed. No intravenous contrast was administered.  COMPARISON:  None.  FINDINGS: The craniocervical junction appears unremarkable. There is 1.5 mm degenerative retrolisthesis at C6-7.  No significant abnormal spinal cord signal is observed. No significant vertebral marrow edema is identified. Intervertebral disc desiccation is observed at all levels in the cervical spine with loss of disc height most notable at C6-7.  Additional findings at individual levels  are as follows:  C2-3:  Unremarkable.  C3-4: Mild to moderate bilateral foraminal stenosis and borderline central narrowing of the thecal sac due to disc bulge, uncinate spurring, and facet arthropathy.  C4-5: Mild to moderate bilateral foraminal stenosis and borderline central narrowing of the thecal sac due to disc bulge, uncinate spurring, and facet arthropathy.  C5-6: Prominent central narrowing of the thecal sac with prominent bilateral foraminal stenosis due to disc bulge, posterior osseous ridging, uncinate spurring, and facet arthropathy. AP diameter of the thecal sac narrowed to 5 mm. No cord edema.  C6-7: Moderate to severe bilateral foraminal stenosis and moderate left eccentric central narrowing of the thecal sac due to posterior osseous ridging, disc bulge, left paracentral disc protrusion, uncinate spurring, and facet arthropathy.  C7-T1:  Unremarkable.  IMPRESSION: 1. Cervical spondylosis and degenerative disc disease, causing prominent impingement at C5-6 ; moderate to prominent impingement at C6-7 ; and mild to moderate impingement at C3-4 and C4-5, as detailed above.   Electronically Signed   By: Van Clines M.D.   On: 06/21/2015 11:23   Thoracic Imaging: Thoracic MR wo contrast:  Results for orders placed  during the hospital encounter of 06/16/15  MR Thoracic Spine Wo Contrast   Narrative CLINICAL DATA:  58 year old male with back pain and left rib pain. Subsequent encounter.  EXAM: MRI THORACIC SPINE WITHOUT CONTRAST  TECHNIQUE: Multiplanar, multisequence MR imaging of the thoracic spine was performed. No intravenous contrast was administered.  COMPARISON:  06/19/2015 plain film exam. 05/02/2012 thoracic spine MR.  FINDINGS: On the single sagittal scout exam obtained through the cervical spine for proper level assignment, C5-6 cervical spondylotic changes with spinal stenosis and foraminal narrowing is noted but incompletely assessed. MR cervical spine would be necessary for further delineation.  Very small bilateral pleural effusions. Limited evaluation of mediastinal structures.  No worrisome osseous lesion.  No focal thoracic cord signal abnormality.  T1-2 through T4-5 are unremarkable.  T5-6:  Minimal bulge.  T6-7: Minimal Schmorl's node deformity. Minimal bulge greater to the right. Slight narrowing ventral thecal sac. Minimal right-sided cord contact.  T7-8: Schmorl's Schmorl's node deformity. Minimal bulge greater to left. Slight narrowing ventral thecal sac with minimal left-sided cord contact.  T8-9: Minimal Schmorl's node deformity.  Minimal bulge.  T9-10:  Minimal Schmorl's node deformity.  Minimal bulge.  T10-11: Small Schmorl's node deformity. Minimal bulge. Minimal narrowing ventral thecal sac. Minimal posterior element hypertrophy.  T10-11: Minimal Schmorl's node deformity.  T12-L1:  Small Schmorl's node deformity.  Minimal bulge.  IMPRESSION:  Minimal degenerative changes mid to lower thoracic spine as detailed above. On the left most notable finding is at the T7-8 level.  C5-6 cervical spondylotic changes with spinal stenosis and foraminal narrowing noted but incompletely assessed. MR cervical spine would be necessary for further  delineation.  Very small bilateral pleural effusions.   Electronically Signed   By: Genia Del M.D.   On: 06/20/2015 16:27     Results for orders placed during the hospital encounter of 06/16/15  [facility] Thoracic Spine 2 View   Narrative CLINICAL DATA:  Back pain  EXAM: THORACIC SPINE 2 VIEWS  COMPARISON:  None.  FINDINGS: Normal thoracic alignment. Negative for fracture. Disc degeneration and right lateral spurring noted throughout the thoracic spine.  IMPRESSION: Negative for fracture   Electronically Signed   By: Franchot Gallo M.D.   On: 06/19/2015 12:01   Knee-L DG 4 views:  Results for orders placed during the hospital encounter of 12/11/15  DG  Knee Complete 4 Views Left   Narrative CLINICAL DATA:  Medial left knee pain for 1 week.  No known injury. EXAM: LEFT KNEE - COMPLETE 4+ VIEW COMPARISON:  None. FINDINGS: Joint space narrowing and spurring throughout the left knee, most pronounced in the medial and patellofemoral compartments. No acute bony abnormality. Specifically, no fracture, subluxation, or dislocation. Soft tissues are intact. No joint effusion. IMPRESSION: Mild-to-moderate degenerative changes.  No acute bony abnormality. Electronically Signed   By: Rolm Baptise M.D.   On: 12/11/2015 09:35   Ankle Imaging: Ankle-R DG Complete:  Results for orders placed during the hospital encounter of 12/17/15  DG Ankle Complete Right   Narrative CLINICAL DATA:  Right ankle and foot painful swelling x two days with no known trauma or injury. Pt denies hx of pervious injury or trauma.  EXAM: RIGHT ANKLE - COMPLETE 3+ VIEW  COMPARISON:  None.  FINDINGS: Ankle mortise intact. The talar dome is normal. No malleolar fracture. The calcaneus is normal. Spurring of the calcaneus noted. No soft tissue abnormality.  IMPRESSION: No acute osseous abnormality.   Electronically Signed   By: Suzy Bouchard M.D.   On: 12/17/2015 15:50     Foot  Imaging: Foot-R DG Complete:  Results for orders placed during the hospital encounter of 12/17/15  DG Foot Complete Right   Narrative CLINICAL DATA:  RIGHT foot pain.  No injury  EXAM: RIGHT FOOT COMPLETE - 3+ VIEW  COMPARISON:  None.  FINDINGS: No fracture dislocation. No significant arthropathy. No soft tissue abnormality. Mild spurring of the calcaneus.  IMPRESSION: No explanation foot pain.   Electronically Signed   By: Suzy Bouchard M.D.   On: 12/17/2015 15:52     Complexity Note: Imaging results reviewed. Results shared with Mr. Mccauley, using Layman's terms.                         ROS  Cardiovascular: High blood pressure, Chest pain and Needs antibiotics prior to dental procedures Pulmonary or Respiratory: Shortness of breath and Snoring  Neurological: Curved spine Review of Past Neurological Studies: No results found for this or any previous visit. Psychological-Psychiatric: Anxiousness, Depressed and Difficulty sleeping and or falling asleep Gastrointestinal: Vomiting blood (Ulcers), Reflux or heatburn and Alternating episodes iof diarrhea and constipation (IBS-Irritable bowe syndrome) Genitourinary: Kidney disease Hematological: No reported hematological signs or symptoms such as prolonged bleeding, low or poor functioning platelets, bruising or bleeding easily, hereditary bleeding problems, low energy levels due to low hemoglobin or being anemic Endocrine: High blood sugar requiring insulin (IDDM) Rheumatologic: No reported rheumatological signs and symptoms such as fatigue, joint pain, tenderness, swelling, redness, heat, stiffness, decreased range of motion, with or without associated rash Musculoskeletal: Negative for myasthenia gravis, muscular dystrophy, multiple sclerosis or malignant hyperthermia Work History: Retired  Allergies  Mr. Bou is allergic to hydromorphone; aspirin; and floxin [ofloxacin].  Laboratory Chemistry Profile    Screening Lab Results  Component Value Date   STAPHAUREUS NEGATIVE 08/20/2017   MRSAPCR NEGATIVE 08/20/2017    Inflammation (CRP: Acute Phase) (ESR: Chronic Phase) Lab Results  Component Value Date   LATICACIDVEN 2.1 ([place]) 06/17/2015                         Rheumatology No results found for: [place], [place], [place], [place], [place], [place], [place]  Renal Lab Results  Component Value Date   BUN 27 (H) 06/24/2015   CREATININE 1.38 (H) 06/24/2015   GFRAA >60 06/24/2015   GFRNONAA 57 (L) 06/24/2015                             Hepatic Lab Results  Component Value Date   AST 32 05/31/2014   ALT 40 05/31/2014   ALBUMIN 3.5 05/31/2014   ALKPHOS 149 (H) 05/31/2014   LIPASE 231 05/31/2014                        Electrolytes Lab Results  Component Value Date   NA 138 06/24/2015   K 4.6 06/24/2015   CL 102 06/24/2015   CALCIUM 9.8 06/24/2015                        Neuropathy Lab Results  Component Value Date   HGBA1C 7.4 (H) 05/27/2015                        Coagulation Lab Results  Component Value Date   INR 1.15 06/17/2015   LABPROT 14.9 06/17/2015   APTT 25 06/17/2015   PLT 239 06/24/2015                        Cardiovascular Lab Results  Component Value Date   CKTOTAL 82 05/26/2012   CKMB < 0.5 (L) 05/26/2012   TROPONINI 0.03 06/18/2015   HGB 15.6 06/24/2015   HCT 47.0 06/24/2015                         ID Lab Results  Component Value Date   STAPHAUREUS NEGATIVE 08/20/2017   MRSAPCR NEGATIVE 08/20/2017   MICROTEXT  08/25/2014       COMMENT                   NO SALMONELLA OR SHIGELLA ISOLATED   COMMENT                   NO PATHOGENIC E.COLI DETECTED   COMMENT                   NO CAMPYLOBACTER ANTIGEN DETECTED   ANTIBIOTIC                                                       MICROTEXT  08/25/2014       SOURCE: stool    C.DIFFICILE ANTIGEN       C.DIFFICILE GDH ANTIGEN : POSITIVE   C.DIFFICILE TOXIN A/B      C.DIFFICILE TOXINS A AND B : NEGATIVE   PCR FOR TOXIGENIC C.DIFF  PCR FOR TOXIGENIC C.DIFFICILE : POSITIVE   INTERPRETATION            Positive for toxigenic C. difficile, active toxin production NOT detected.  Patient has toxigenic C. difficile organisms present in the bowel, but toxin was not detected.  The patient may be a carrier or the level of toxin in  the sample was below the limit of detection. This information should be used in conjunction with the patient's clinical history when deciding  on possible therapy.   ANTIBIOTIC                                                        Cancer No results found for: CEA, CA125, LABCA2                      Endocrine No results found for: TSH, FREET4, TESTOFREE, TESTOSTERONE, SHBG, ESTRADIOL, ESTRADIOLPCT, ESTRADIOLFRE, LABPREG, ACTH                      Note: Lab results reviewed.  [place]  Drug: Mr. Bowdish  reports no history of drug use. Alcohol:  reports no history of alcohol use. Tobacco:  reports that he has never smoked. He has never used smokeless tobacco. Medical:  has a past medical history of Acid indigestion (02/14/2012), Anxiety, Arthritis, Bulge of lumbar disc without myelopathy (09/05/2011), Cancer ([place]) (2012), Cervical stenosis of spine, Cervicalgia, Chest wall pain, Clostridium difficile infection (02/04/2014), DDD (degenerative disc disease), lumbar, DDD (degenerative disc disease), lumbar, Degeneration of intervertebral disc of lumbar region (08/23/2011), Diabetes mellitus ([place]) (07/24/2011), Diabetes mellitus without complication ([place]), Diabetic polyneuropathy associated with type 2 diabetes mellitus ([place]), Diarrhea, DM (diabetes mellitus) type 2, uncontrolled, with ketoacidosis ([place]) (05/30/2015), Dyspepsia, Enthesopathy of hip (04/11/2015), Fatty liver disease, nonalcoholic (0/06/5425), GERD (gastroesophageal reflux disease), Headache, Hepatic cirrhosis ([place]) (09/12/2014), Hydrocele, Hydrocele, Hypertension, IBS (irritable bowel  syndrome), Lumbar herniated disc, Lumbar radiculopathy (09/05/2011), Lumbar stenosis, Peroneal tendon injury, right, initial encounter, Peroneal tendon tear, Radiculopathy, Renal insufficiency, Sleep apnea, Stenosis of cervical spine, Thoracic and lumbosacral neuritis (07/24/2011), 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 Family: family history includes Asthma in his mother; Diabetes in his father and mother; Heart disease in his father; Hypertension in his mother; Kidney disease in his father; Stroke in his father.  Past Surgical History:  Procedure Laterality Date  . BACK SURGERY     cervical fusion C5-7  . CARDIAC CATHETERIZATION Right 06/20/2015   Procedure: Left Heart Cath and Coronary Angiography;  Surgeon: Dionisio David, MD;  Location: [facility];  Service: Cardiovascular;  Laterality: Right;  . COLONOSCOPY    . CYSTOSCOPY W/ RETROGRADES Left 05/18/2015   Procedure: CYSTOSCOPY WITH RETROGRADE PYELOGRAM;  Surgeon: Nickie Retort, MD;  Location: [facility];  Service: Urology;  Laterality: Left;  . CYSTOSCOPY WITH STENT PLACEMENT Left 05/18/2015   Procedure: CYSTOSCOPY WITH STENT PLACEMENT;  Surgeon: Nickie Retort, MD;  Location: [facility];  Service: Urology;  Laterality: Left;  . CYSTOSCOPY WITH STENT PLACEMENT Left 06/28/2015   Procedure: CYSTOSCOPY WITH STENT PLACEMENT/ EXCHANGE;  Surgeon: Hollice Espy, MD;  Location: [facility];  Service: Urology;  Laterality: Left;  . ESOPHAGOGASTRODUODENOSCOPY (EGD) WITH PROPOFOL N/A 09/03/2016   Procedure: ESOPHAGOGASTRODUODENOSCOPY (EGD) WITH PROPOFOL;  Surgeon: Manya Silvas, MD;  Location[facility];  Service: Endoscopy;  Laterality: N/A;  . ESOPHAGOGASTRODUODENOSCOPY ENDOSCOPY N/A 03/14/2018   Procedure: ESOPHAGOGASTRODUODENOSCOPY ENDOSCOPY;  Surgeon: Manya Silvas, MD;  Location: [facility];  Service: Endoscopy;  Laterality: N/A;  . HALLUX VALGUS BASE WEDGE Right 08/23/2017   Procedure: HALLUX VALGUS BASE WEDGE;   Surgeon: Samara Deist, DPM;  Location: [facility];  Service: Podiatry;  Laterality: Right;  . JOINT REPLACEMENT     right knee   . kidney removed Right    2012  .  NAFLD    . NEPHRECTOMY Right 04/2011  . ORIF TOE FRACTURE Right 08/23/2017   Procedure: OPEN REDUCTION INTERNAL FIXATION (ORIF) METATARSAL (TOE) FRACTURE;  Surgeon: Samara Deist, DPM;  Location: [facility];  Service: Podiatry;  Laterality: Right;  . SPINE SURGERY     lumbar 2013  . URETEROSCOPY WITH HOLMIUM LASER LITHOTRIPSY Left 05/18/2015   Procedure: URETEROSCOPY WITH HOLMIUM LASER LITHOTRIPSY;  Surgeon: Nickie Retort, MD;  Location: [facility];  Service: Urology;  Laterality: Left;  . URETEROSCOPY WITH HOLMIUM LASER LITHOTRIPSY Left 06/28/2015   Procedure: URETEROSCOPY WITH HOLMIUM LASER LITHOTRIPSY;  Surgeon: Hollice Espy, MD;  Location: [facility];  Service: Urology;  Laterality: Left;   Active Ambulatory Problems    Diagnosis Date Noted  . Cervical pain 08/06/2014  . Hepatic cirrhosis ([place]) 09/12/2014  . Clostridium difficile infection 02/04/2014  . Degeneration of intervertebral disc of lumbar region 08/23/2011  . D (diarrhea) 12/17/2013  . Acid indigestion 02/14/2012  . Enthesopathy of hip 04/11/2015  . LBP (low back pain) 12/18/2011  . 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 Lumbar canal stenosis 08/23/2011  . Fatty liver disease, nonalcoholic 67/54/4920  . Arthralgia of hip 08/26/2013  . H/O urinary disorder 06/29/2013  . Lumbar radiculopathy 09/05/2011  . 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 Thoracic and lumbosacral neuritis 07/24/2011  . Diabetes mellitus ([place]) 07/24/2011  . Acute pyelonephritis 05/27/2015  . Pain due to ureteral stent ([place]) 05/27/2015  . Left sided abdominal pain 05/30/2015  . Chest pain 05/30/2015  . CKD (chronic kidney disease), 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  . Hyponatremia 05/30/2015  . Leukocytosis 05/30/2015  . DM (diabetes mellitus) type 2, uncontrolled,  with ketoacidosis ([place]) 05/30/2015  . Solitary kidney 05/30/2015  . Sepsis ([place]) 06/16/2015  . Cervical spinal stenosis 06/24/2015   Resolved Ambulatory Problems    Diagnosis Date Noted  . No Resolved Ambulatory Problems   Past Medical History:  Diagnosis Date  . Anxiety   . Arthritis   . Cancer ([place]) 2012  . Cervical stenosis of spine   . Cervicalgia   . Chest wall pain   . DDD (degenerative disc disease), lumbar   . DDD (degenerative disc disease), lumbar   . Diabetes mellitus without complication ([place])   . Diabetic polyneuropathy associated with type 2 diabetes mellitus ([place])   . Diarrhea   . Dyspepsia   . GERD (gastroesophageal reflux disease)   . Headache   . Hydrocele   . Hydrocele   . Hypertension   . IBS (irritable bowel syndrome)   . Lumbar herniated disc   . Lumbar stenosis   . Peroneal tendon injury, right, initial encounter   . Peroneal tendon tear   . Radiculopathy   . Renal insufficiency   . Sleep apnea   . Stenosis of cervical spine   . 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 Thrombocytopenia ([place])    Constitutional Exam  General appearance: Well nourished, well developed, and well hydrated. In no apparent acute distress Vitals:   12/25/18 1040  BP: 128/87  Pulse: 66  Resp: 16  Temp: 98.4 F (36.9 C)  TempSrc: Oral  SpO2: 96%  Weight: 275 lb (124.7 kg)  Height: 6' 2" (1.88 m)   BMI Assessment: Estimated body mass index is 35.31 kg/m as calculated from the following:   Height as of this encounter: 6' 2" (1.88 m).   Weight as of this encounter: 275 lb (124.7 kg).  BMI interpretation table: BMI level Category Range association with  higher incidence of chronic pain  <18 kg/m2 Underweight   18.5-24.9 kg/m2 Ideal body weight   25-29.9 kg/m2 Overweight Increased incidence by 20%  30-34.9 kg/m2 Obese (Class I) Increased incidence by 68%  35-39.9 kg/m2 Severe obesity (Class II) Increased incidence by 136%  >40 kg/m2 Extreme obesity (Class III) Increased  incidence by 254%   Patient's current BMI Ideal Body weight  Body mass index is 35.31 kg/m. Ideal body weight: 82.2 kg (181 lb 3.5 oz) Adjusted ideal body weight: 99.2 kg (218 lb 11.7 oz)   BMI Readings from Last 4 Encounters:  12/25/18 35.31 kg/m  03/14/18 36.34 kg/m  08/23/17 34.67 kg/m  08/20/17 34.15 kg/m   Wt Readings from Last 4 Encounters:  12/25/18 275 lb (124.7 kg)  03/14/18 283 lb (128.4 kg)  08/23/17 270 lb (122.5 kg)  08/20/17 266 lb (120.7 kg)  Psych/Mental status: Alert, oriented x 3 (person, place, & time)       Eyes: PERLA Respiratory: No evidence of acute respiratory distress  Cervical Spine Area Exam  Skin & Axial Inspection: No masses, redness, edema, swelling, or associated skin lesions Alignment: Symmetrical Functional ROM: Unrestricted ROM      Stability: No instability detected Muscle Tone/Strength: Functionally intact. No obvious neuro-muscular anomalies detected. Sensory (Neurological): Unimpaired Palpation: No palpable anomalies              Upper Extremity (UE) Exam    Side: Right upper extremity  Side: Left upper extremity  Skin & Extremity Inspection: Skin color, temperature, and hair growth are WNL. No peripheral edema or cyanosis. No masses, redness, swelling, asymmetry, or associated skin lesions. No contractures.  Skin & Extremity Inspection: Skin color, temperature, and hair growth are WNL. No peripheral edema or cyanosis. No masses, redness, swelling, asymmetry, or associated skin lesions. No contractures.  Functional ROM: Unrestricted ROM          Functional ROM: Unrestricted ROM          Muscle Tone/Strength: Functionally intact. No obvious neuro-muscular anomalies detected.  Muscle Tone/Strength: Functionally intact. No obvious neuro-muscular anomalies detected.  Sensory (Neurological): Unimpaired          Sensory (Neurological): Unimpaired          Palpation: No palpable anomalies              Palpation: No palpable anomalies               Provocative Test(s):  Phalen's test: deferred Tinel's test: deferred Apley's scratch test (touch opposite shoulder):  Action 1 (Across chest): deferred Action 2 (Overhead): deferred Action 3 (LB reach): deferred   Provocative Test(s):  Phalen's test: deferred Tinel's test: deferred Apley's scratch test (touch opposite shoulder):  Action 1 (Across chest): deferred Action 2 (Overhead): deferred Action 3 (LB reach): deferred    Thoracic Spine Area Exam  Skin & Axial Inspection: No masses, redness, or swelling Alignment: Symmetrical Functional ROM: Unrestricted ROM Stability: No instability detected Muscle Tone/Strength: Functionally intact. No obvious neuro-muscular anomalies detected. Sensory (Neurological): Unimpaired Muscle strength & Tone: No palpable anomalies  Lumbar Spine Area Exam  Skin & Axial Inspection: No masses, redness, or swelling Alignment: Symmetrical Functional ROM: Decreased ROM affecting both sides Stability: No instability detected Muscle Tone/Strength: Functionally intact. No obvious neuro-muscular anomalies detected. Sensory (Neurological): Musculoskeletal pain pattern Palpation: No palpable anomalies       Provocative Tests: Hyperextension/rotation test: (-) bilaterally for facet joint pain. Lumbar quadrant test (Kemp's test): (-) bilaterally for facet joint pain. Lateral bending  test: deferred today       Patrick's Maneuver: deferred today                   FABER* test: (+) for bilateral S-I arthralgia             S-I anterior distraction/compression test: (+)   S-I arthralgia/arthropathy S-I lateral compression test: (+)   S-I arthralgia/arthropathy S-I Thigh-thrust test: (+) bilateral S-I arthralgia/arthropathy S-I Gaenslen's test: deferred today         *(Flexion, ABduction and External Rotation)  Gait & Posture Assessment  Ambulation: Unassisted Gait: Relatively normal for age and body habitus Posture: WNL   Lower Extremity Exam    Side:  Right lower extremity  Side: Left lower extremity  Stability: No instability observed          Stability: No instability observed          Skin & Extremity Inspection: Pale, discolored, cool to touch, decreased hair growth, swelling present  Skin & Extremity Inspection: Skin color, temperature, and hair growth are WNL. No peripheral edema or cyanosis. No masses, redness, swelling, asymmetry, or associated skin lesions. No contractures.  Functional ROM: Pain restricted ROM right ankle joint                  Functional ROM: Unrestricted ROM                  Muscle Tone/Strength: Functionally intact. No obvious neuro-muscular anomalies detected.  Muscle Tone/Strength: Functionally intact. No obvious neuro-muscular anomalies detected.  Sensory (Neurological): Neurogenic pain pattern right foot        Sensory (Neurological): Unimpaired        DTR: Patellar: 2+: normal Achilles: deferred today Plantar: deferred today  DTR: Patellar: 2+: normal Achilles: deferred today Plantar: deferred today  Palpation: No palpable anomalies  Palpation: No palpable anomalies   Assessment  Primary Diagnosis & Pertinent Problem List: The primary encounter diagnosis was RSD (reflex sympathetic dystrophy). Diagnoses of Lumbar radiculopathy, Chronic radicular lumbar pain, Lumbar degenerative disc disease, Chronic pain syndrome, SI joint arthritis, and Sacroiliac joint pain were also pertinent to this visit.  Visit Diagnosis (New problems to examiner): 1. RSD (reflex sympathetic dystrophy)   2. Lumbar radiculopathy   3. Chronic radicular lumbar pain   4. Lumbar degenerative disc disease   5. Chronic pain syndrome   6. SI joint arthritis   7. Sacroiliac joint pain    General Recommendations: The pain condition that the patient suffers from is best treated with a multidisciplinary approach that involves an increase in physical activity to prevent de-conditioning and worsening of the pain cycle, as well as  psychological counseling (formal and/or informal) to address the co-morbid psychological affects of pain. Treatment will often involve judicious use of pain medications and interventional procedures to decrease the pain, allowing the patient to participate in the physical activity that will ultimately produce long-lasting pain reductions. The goal of the multidisciplinary approach is to return the patient to a higher level of overall function and to restore their ability to perform activities of daily living.  58 year old male who presents with a chief complaint of right foot and ankle pain as well as low back pain.  Patient has a diagnosis of right lower extremity/foot RSD.  Patient has a history of right toe fracture followed by open reduction internal fixation on 08/23/2017.  He states that his pain, discoloration, coolness to touch, swelling, lack of hair growth which is all consistent with [place]  criteria for complex regional pain syndrome, started then.  Patient also has history of nephrectomy due to renal cancer in the past (06/29/2013.  The patient avoids NSAIDs as a result.  Of note patient was previously seen by [facility] and had a series of procedures done there.  For his right foot pain, the patient has undergone a series of 2 lumbar sympathetic nerve blocks at right L2 and L4 without any improvement in his pain.  [facility] did not recommend repeating any additional.  Patient has also undergone spinal cord stimulator trial which was not effective.  He has undergone lumbar facet steroid injections as well as transforaminal epidural steroid injections at L3-L4 and L4-L5 without any significant benefit.  Patient is also had prior right knee arthroscopy.  Patient states that none of these injections were helpful.  I do not have any new interventional therapies to offer this patient since he is already tried and failed diagnostic lumbar sympathetic block for his right lower  extremity complex regional pain syndrome as well as diagnostic facet blocks and transforaminal epidural steroid injections for his low back pain related to lumbar facet arthropathy and chronic lumbar radiculopathy.  Patient does endorse bilateral hip pain and SI joint pain with occasional radiation into his groin.  Can consider bilateral sacroiliac joint injection we will focus primarily on medication management.  Patient takes tramadol 50 mg 3 times daily which he states does not really help with his pain.  He is also on tizanidine and gabapentin.  He has tried Lyrica in the past which he states was not effective.  Patient also takes Cymbalta 60 mg daily.  He is a type II diabetic on insulin.    Plan: -UDS today.  If appropriate can take over and increase tramadol to 100 mg 3 times daily as needed.  NMDA blockade and tramadol could be effective for his CRPS related pain.  If not can consider levorphanol. -Diagnostic bilateral sacroiliac joint injection for SI joint arthritis.  (Positive Patrick's and Faber's test documented above)  Orders Placed This Encounter  Procedures  . SACROILIAC JOINT INJECTION    Standing Status:   Future    Standing Expiration Date:   01/25/2019    Scheduling Instructions:     Side: Bilateral     Sedation: WITHOUT     Timeframe: ASAP    Order Specific Question:   Where will this procedure be performed?    Answer:   [facility]  . [facility], Ur    Volume: 30 ml(s). Minimum 3 ml of urine is needed. Document temperature of fresh sample. Indications: Long term (current) use of opiate analgesic (Z79.891) Test#: [id number] (Comprehensive Profile)    Procedure Orders     SACROILIAC JOINT INJECTION  Pharmacological management options:  Opioid Analgesics: The patient was informed that there is no guarantee that he would be a candidate for opioid analgesics. The decision will be made following CDC guidelines. This decision will be based on the results  of diagnostic studies, as well as Mr. Mark risk profile.   Membrane stabilizer: Adequate regimen  Muscle relaxant: To be determined at a later time  NSAID: Medically contraindicated  Other analgesic(s): To be determined at a later time   Interventional management options: Mr. Ferencz was informed that there is no guarantee that he would be a candidate for interventional therapies. The decision will be based on the results of diagnostic studies, as well as Mr. [place] risk profile.  Procedure(s) under consideration:  SI-J   Provider-requested follow-up: Return in about 2 weeks (around 01/08/2019) for Procedure: B/L SI-J.  No future appointments.  Primary Care Physician: Valera Castle, MD Location: [facility] Note by: Gillis Santa, MD Date: 12/25/2018; Time: 3:10 PM  Note: This dictation was prepared with Dragon dictation. Any transcriptional errors that may result from this process are unintentional.

## 2018-12-25 NOTE — Progress Notes (Signed)
Safety precautions to be maintained throughout the outpatient stay will include: orient to surroundings, keep bed in low position, maintain call bell within reach at all times, provide assistance with transfer out of bed and ambulation.  

## 2018-12-25 NOTE — Patient Instructions (Addendum)
____________________________________________________________________________________________  Preparing for your procedure (without sedation)  Procedure appointments are limited to planned procedures: . No Prescription Refills. . No disability issues will be discussed. . No medication changes will be discussed.  Instructions: . Oral Intake: Do not eat or drink anything for at least 3 hours prior to your procedure. . Transportation: Unless otherwise stated by your physician, you may drive yourself after the procedure. . Blood Pressure Medicine: Take your blood pressure medicine with a sip of water the morning of the procedure. . Blood thinners: Notify our staff if you are taking any blood thinners. Depending on which one you take, there will be specific instructions on how and when to stop it. . Diabetics on insulin: Notify the staff so that you can be scheduled 1st case in the morning. If your diabetes requires high dose insulin, take only  of your normal insulin dose the morning of the procedure and notify the staff that you have done so. . Preventing infections: Shower with an antibacterial soap the morning of your procedure.  . Build-up your immune system: Take 1000 mg of Vitamin C with every meal (3 times a day) the day prior to your procedure. . Antibiotics: Inform the staff if you have a condition or reason that requires you to take antibiotics before dental procedures. . Pregnancy: If you are pregnant, call and cancel the procedure. . Sickness: If you have a cold, fever, or any active infections, call and cancel the procedure. . Arrival: You must be in the facility at least 30 minutes prior to your scheduled procedure. . Children: Do not bring any children with you. . Dress appropriately: Bring dark clothing that you would not mind if they get stained. . Valuables: Do not bring any jewelry or valuables.  Reasons to call and reschedule or cancel your procedure: (Following these  recommendations will minimize the risk of a serious complication.) . Surgeries: Avoid having procedures within 2 weeks of any surgery. (Avoid for 2 weeks before or after any surgery). . Flu Shots: Avoid having procedures within 2 weeks of a flu shots or . (Avoid for 2 weeks before or after immunizations). . Barium: Avoid having a procedure within 7-10 days after having had a radiological study involving the use of radiological contrast. (Myelograms, Barium swallow or enema study). . Heart attacks: Avoid any elective procedures or surgeries for the initial 6 months after a "Myocardial Infarction" (Heart Attack). . Blood thinners: It is imperative that you stop these medications before procedures. Let us know if you if you take any blood thinner.  . Infection: Avoid procedures during or within two weeks of an infection (including chest colds or gastrointestinal problems). Symptoms associated with infections include: Localized redness, fever, chills, night sweats or profuse sweating, burning sensation when voiding, cough, congestion, stuffiness, runny nose, sore throat, diarrhea, nausea, vomiting, cold or Flu symptoms, recent or current infections. It is specially important if the infection is over the area that we intend to treat. . Heart and lung problems: Symptoms that may suggest an active cardiopulmonary problem include: cough, chest pain, breathing difficulties or shortness of breath, dizziness, ankle swelling, uncontrolled high or unusually low blood pressure, and/or palpitations. If you are experiencing any of these symptoms, cancel your procedure and contact your primary care physician for an evaluation.  Remember:  Regular Business hours are:  Monday to Thursday 8:00 AM to 4:00 PM  Provider's Schedule: Francisco Naveira, MD:  Procedure days: Tuesday and Thursday 7:30 AM to 4:00 PM  [place]   Holley Raring, MD:  Procedure days: Monday and Wednesday 7:30 AM to 4:00  PM ____________________________________________________________________________________________   Sacroiliac (SI) Joint Injection Patient Information  Description: The sacroiliac joint connects the scrum (very low back and tailbone) to the ilium (a pelvic bone which also forms half of the hip joint).  Normally this joint experiences very little motion.  When this joint becomes inflamed or unstable low back and or hip and pelvis pain may result.  Injection of this joint with local anesthetics (numbing medicines) and steroids can provide diagnostic information and reduce pain.  This injection is performed with the aid of x-ray guidance into the tailbone area while you are lying on your stomach.   You may experience an electrical sensation down the leg while this is being done.  You may also experience numbness.  We also may ask if we are reproducing your normal pain during the injection.  Conditions which may be treated SI injection:   Low back, buttock, hip or leg pain  Preparation for the Injection:  1. Do not eat any solid food or dairy products within 8 hours of your appointment.  2. You may drink clear liquids up to 3 hours before appointment.  Clear liquids include water, black coffee, juice or soda.  No milk or cream please. 3. You may take your regular medications, including pain medications with a sip of water before your appointment.  Diabetics should hold regular insulin (if take separately) and take 1/2 normal NPH dose the morning of the procedure.  Carry some sugar containing items with you to your appointment. 4. A driver must accompany you and be prepared to drive you home after your procedure. 5. Bring all of your current medications with you. 6. An IV may be inserted and sedation may be given at the discretion of the physician. 7. A blood pressure cuff, EKG and other monitors will often be applied during the procedure.  Some patients may need to have extra oxygen administered  for a short period.  8. You will be asked to provide medical information, including your allergies, prior to the procedure.  We must know immediately if you are taking blood thinners (like Coumadin/Warfarin) or if you are allergic to IV iodine contrast (dye).  We must know if you could possible be pregnant.  Possible side effects:   Bleeding from needle site  Infection (rare, may require surgery)  Nerve injury (rare)  Numbness & tingling (temporary)  A brief convulsion or seizure  Light-headedness (temporary)  Pain at injection site (several days)  Decreased blood pressure (temporary)  Weakness in the leg (temporary)   Call if you experience:   New onset weakness or numbness of an extremity below the injection site that last more than 8 hours.  Hives or difficulty breathing ( go to the emergency room)  Inflammation or drainage at the injection site  Any new symptoms which are concerning to you  Please note:  Although the local anesthetic injected can often make your back/ hip/ buttock/ leg feel good for several hours after the injections, the pain will likely return.  It takes 3-7 days for steroids to work in the sacroiliac area.  You may not notice any pain relief for at least that one week.  If effective, we will often do a series of three injections spaced 3-6 weeks apart to maximally decrease your pain.  After the initial series, we generally will wait some months before a repeat injection of the same type.  If you  have any questions, please call [phone] [facility]

## 2018-12-28 LAB — COMPLIANCE DRUG ANALYSIS, UR

## 2019-01-05 ENCOUNTER — Ambulatory Visit (HOSPITAL_BASED_OUTPATIENT_CLINIC_OR_DEPARTMENT_OTHER): Payer: PRIVATE HEALTH INSURANCE | Admitting: Student in an Organized Health Care Education/Training Program

## 2019-01-05 ENCOUNTER — Encounter: Payer: Self-pay | Admitting: Student in an Organized Health Care Education/Training Program

## 2019-01-05 ENCOUNTER — Ambulatory Visit
Admission: RE | Admit: 2019-01-05 | Discharge: 2019-01-05 | Disposition: A | Discharge status: Home / Self Care | Payer: PRIVATE HEALTH INSURANCE | Source: Ambulatory Visit | Attending: Student in an Organized Health Care Education/Training Program | Admitting: Student in an Organized Health Care Education/Training Program

## 2019-01-05 ENCOUNTER — Other Ambulatory Visit: Payer: Self-pay

## 2019-01-05 DIAGNOSIS — M47818 Spondylosis without myelopathy or radiculopathy, sacral and sacrococcygeal region: Secondary | ICD-10-CM

## 2019-01-05 MED ORDER — LIDOCAINE HCL 2 % IJ SOLN
20.0000 mL | Freq: Once | INTRAMUSCULAR | Status: AC
  Administered 2019-01-05: 400 mg

## 2019-01-05 MED ORDER — IOHEXOL 180 MG/ML  SOLN
10.0000 mL | Freq: Once | INTRAMUSCULAR | Status: AC
  Administered 2019-01-05: 12:00:00 10 mL via EPIDURAL

## 2019-01-05 MED ORDER — DEXAMETHASONE SODIUM PHOSPHATE 10 MG/ML IJ SOLN
INTRAMUSCULAR | Status: AC
  Filled 2019-01-05: qty 1

## 2019-01-05 MED ORDER — DEXAMETHASONE SODIUM PHOSPHATE 10 MG/ML IJ SOLN
10.0000 mg | Freq: Once | INTRAMUSCULAR | Status: AC
  Administered 2019-01-05: 10 mg

## 2019-01-05 MED ORDER — ROPIVACAINE HCL 2 MG/ML IJ SOLN
INTRAMUSCULAR | Status: AC
  Filled 2019-01-05: qty 10

## 2019-01-05 MED ORDER — ROPIVACAINE HCL 2 MG/ML IJ SOLN
1.0000 mL | Freq: Once | INTRAMUSCULAR | Status: AC
  Administered 2019-01-05: 1 mL via EPIDURAL

## 2019-01-05 MED ORDER — LIDOCAINE HCL 2 % IJ SOLN
INTRAMUSCULAR | Status: AC
  Filled 2019-01-05: qty 20

## 2019-01-05 NOTE — Patient Instructions (Signed)

## 2019-01-05 NOTE — Progress Notes (Signed)
Safety precautions to be maintained throughout the outpatient stay will include: orient to surroundings, keep bed in low position, maintain call bell within reach at all times, provide assistance with transfer out of bed and ambulation.  

## 2019-01-05 NOTE — Progress Notes (Signed)
Patient's Name: Anthony Fuller  MRN: [id number]  Referring Provider: Gillis Santa, MD  DOB: Mar 21, 1961  PCP: Valera Castle, MD  DOS: 01/05/2019  Note by: Gillis Santa, MD  Service setting: Ambulatory outpatient  Specialty: Interventional Pain Management  Patient type: Established  Location: [facility] (AMB) Pain Management Facility  Visit type: Interventional Procedure   Primary Reason for Visit: Interventional Pain Management Treatment. CC: Back Pain and Foot Pain  Procedure:          Anesthesia, Analgesia, Anxiolysis:  Type: Diagnostic Sacroiliac Joint Steroid Injection #1  Region: Inferior Lumbosacral Region Level: PIIS (Posterior Inferior Iliac Spine) Laterality: Bilateral  Type: Local Anesthesia  Local Anesthetic: Lidocaine 1-2%  Position: Prone           Indications: 1. SI joint arthritis    Pain Score: Pre-procedure: 7 /10 Post-procedure: 5 /10   Pre-op Assessment:  Anthony Fuller is a 58 y.o. (year old), male patient, seen today for interventional treatment. He  has a past surgical history that includes Spine surgery; kidney removed (Right); Nephrectomy (Right, 04/2011); Ureteroscopy with holmium laser lithotripsy (Left, 05/18/2015); Cystoscopy w/ retrogrades (Left, 05/18/2015); Cystoscopy with stent placement (Left, 05/18/2015); Cardiac catheterization (Right, 06/20/2015); Ureteroscopy with holmium laser lithotripsy (Left, 06/28/2015); Cystoscopy with stent placement (Left, 06/28/2015); Esophagogastroduodenoscopy (egd) with propofol (N/A, 09/03/2016); Hallux valgus base wedge (Right, 08/23/2017); ORIF toe fracture (Right, 08/23/2017); Colonoscopy; NAFLD; Joint replacement; Back surgery; and Esophagogastroduodenoscopy endoscopy (N/A, 03/14/2018). Anthony Fuller has a current medication list which includes the following prescription(s): cetirizine, clotrimazole, dicyclomine, duloxetine, gabapentin, insulin detemir, insulin nph-regular human, lidocaine, metformin, metoclopramide, omeprazole,  tamsulosin, tizanidine, tramadol, empagliflozin, liraglutide, oxycodone-acetaminophen, and ranitidine. His primarily concern today is the Back Pain and Foot Pain  Initial Vital Signs:  Pulse/HCG Rate: 74  Temp: 98.3 F (36.8 C) Resp: 14 BP: (!) 146/85 SpO2: 98 %  BMI: Estimated body mass index is 35.31 kg/m as calculated from the following:   Height as of this encounter: 6\' 2"  (1.88 m).   Weight as of this encounter: 275 lb (124.7 kg).  Risk Assessment: Allergies: Reviewed. He is allergic to hydromorphone; aspirin; and floxin [ofloxacin].  Allergy Precautions: None required Coagulopathies: Reviewed. None identified.  Blood-thinner therapy: None at this time Active Infection(s): Reviewed. None identified. Anthony Fuller is afebrile  Site Confirmation: Anthony Fuller was asked to confirm the procedure and laterality before marking the site Procedure checklist: Completed Consent: Before the procedure and under the influence of no sedative(s), amnesic(s), or anxiolytics, the patient was informed of the treatment options, risks and possible complications. To fulfill our ethical and legal obligations, as recommended by the American Medical Association's Code of Ethics, I have informed the patient of my clinical impression; the nature and purpose of the treatment or procedure; the risks, benefits, and possible complications of the intervention; the alternatives, including doing nothing; the risk(s) and benefit(s) of the alternative treatment(s) or procedure(s); and the risk(s) and benefit(s) of doing nothing. The patient was provided information about the general risks and possible complications associated with the procedure. These may include, but are not limited to: failure to achieve desired goals, infection, bleeding, organ or nerve damage, allergic reactions, paralysis, and death. In addition, the patient was informed of those risks and complications associated to the procedure, such as failure to  decrease pain; infection; bleeding; organ or nerve damage with subsequent damage to sensory, motor, and/or autonomic systems, resulting in permanent pain, numbness, and/or weakness of one or several areas of the body; allergic reactions; (i.e.: anaphylactic reaction);  and/or death. Furthermore, the patient was informed of those risks and complications associated with the medications. These include, but are not limited to: allergic reactions (i.e.: anaphylactic or anaphylactoid reaction(s)); adrenal axis suppression; blood sugar elevation that in diabetics may result in ketoacidosis or comma; water retention that in patients with history of congestive heart failure may result in shortness of breath, pulmonary edema, and decompensation with resultant heart failure; weight gain; swelling or edema; medication-induced neural toxicity; particulate matter embolism and blood vessel occlusion with resultant organ, and/or nervous system infarction; and/or aseptic necrosis of one or more joints. Finally, the patient was informed that Medicine is not an exact science; therefore, there is also the possibility of unforeseen or unpredictable risks and/or possible complications that may result in a catastrophic outcome. The patient indicated having understood very clearly. We have given the patient no guarantees and we have made no promises. Enough time was given to the patient to ask questions, all of which were answered to the patient's satisfaction. Anthony Fuller has indicated that he wanted to continue with the procedure. Attestation: I, the ordering provider, attest that I have discussed with the patient the benefits, risks, side-effects, alternatives, likelihood of achieving goals, and potential problems during recovery for the procedure that I have provided informed consent. Date  Time: 01/05/2019 10:46 AM  Pre-Procedure Preparation:  Monitoring: As per clinic protocol. Respiration, ETCO2, SpO2, BP, heart rate and  rhythm monitor placed and checked for adequate function Safety Precautions: Patient was assessed for positional comfort and pressure points before starting the procedure. Time-out: I initiated and conducted the "Time-out" before starting the procedure, as per protocol. The patient was asked to participate by confirming the accuracy of the "Time Out" information. Verification of the correct person, site, and procedure were performed and confirmed by me, the nursing staff, and the patient. "Time-out" conducted as per Joint Commission's Universal Protocol (UP.01.01.01). Time: 1141  Description of Procedure:          Target Area: Inferior, posterior, aspect of the sacroiliac fissure Approach: Posterior, paraspinal, ipsilateral approach. Area Prepped: Entire Lower Lumbosacral Region Prepping solution: DuraPrep (Iodine Povacrylex [0.7% available iodine] and Isopropyl Alcohol, 74% w/w) Safety Precautions: Aspiration looking for blood return was conducted prior to all injections. At no point did we inject any substances, as a needle was being advanced. No attempts were made at seeking any paresthesias. Safe injection practices and needle disposal techniques used. Medications properly checked for expiration dates. SDV (single dose vial) medications used. Description of the Procedure: Protocol guidelines were followed. The patient was placed in position over the procedure table. The target area was identified and the area prepped in the usual manner. Skin & deeper tissues infiltrated with local anesthetic. Appropriate amount of time allowed to pass for local anesthetics to take effect. The procedure needle was advanced under fluoroscopic guidance into the sacroiliac joint until a firm endpoint was obtained. Proper needle placement secured. Negative aspiration confirmed. Solution injected in intermittent fashion, asking for systemic symptoms every 0.5cc of injectate. The needles were then removed and the area  cleansed, making sure to leave some of the prepping solution back to take advantage of its long term bactericidal properties. Vitals:   01/05/19 1051 01/05/19 1135 01/05/19 1145 01/05/19 1155  BP: (!) 146/85 (!) 141/76 139/74 124/81  Pulse: 74 78 80 81  Resp:  14 18 20   Temp: 98.3 F (36.8 C)     SpO2: 98% 97% 97% 97%  Weight:      Height:  Start Time: 1141 hrs. End Time: 1154 hrs. Materials:  Needle(s) Type: Spinal Needle Gauge: 22G Length: 3.5-in Medication(s): Please see orders for medications and dosing details. 5 cc solution made of 4 cc of 0.2% ropivacaine, 1 cc of Decadron 10 mg/cc.  2.5 cc injected intra-articular, 2.5 cc injected periarticular for the left SI joint. 5 cc solution made of 4 cc of 0.2% ropivacaine, 1 cc of Decadron 10 mg/cc.  2.5 cc injected intra-articular, 2.5 cc injected periarticular for the rightSI joint. Imaging Guidance (Non-Spinal):          Type of Imaging Technique: Fluoroscopy Guidance (Non-Spinal) Indication(s): Assistance in needle guidance and placement for procedures requiring needle placement in or near specific anatomical locations not easily accessible without such assistance. Exposure Time: Please see nurses notes. Contrast: Before injecting any contrast, we confirmed that the patient did not have an allergy to iodine, shellfish, or radiological contrast. Once satisfactory needle placement was completed at the desired level, radiological contrast was injected. Contrast injected under live fluoroscopy. No contrast complications. See chart for type and volume of contrast used. Fluoroscopic Guidance: I was personally present during the use of fluoroscopy. "Tunnel Vision Technique" used to obtain the best possible view of the target area. Parallax error corrected before commencing the procedure. "Direction-depth-direction" technique used to introduce the needle under continuous pulsed fluoroscopy. Once target was reached, antero-posterior,  oblique, and lateral fluoroscopic projection used confirm needle placement in all planes. Images permanently stored in EMR. Interpretation: I personally interpreted the imaging intraoperatively. Adequate needle placement confirmed in multiple planes. Appropriate spread of contrast into desired area was observed. No evidence of afferent or efferent intravascular uptake. Permanent images saved into the patient's record.  Antibiotic Prophylaxis:   Anti-infectives (From admission, onward)   None     Indication(s): None identified  Post-operative Assessment:  Post-procedure Vital Signs:  Pulse/HCG Rate: 81  Temp: 98.3 F (36.8 C) Resp: 20 BP: 124/81 SpO2: 97 %  EBL: None  Complications: No immediate post-treatment complications observed by team, or reported by patient.  Note: The patient tolerated the entire procedure well. A repeat set of vitals were taken after the procedure and the patient was kept under observation following institutional policy, for this type of procedure. Post-procedural neurological assessment was performed, showing return to baseline, prior to discharge. The patient was provided with post-procedure discharge instructions, including a section on how to identify potential problems. Should any problems arise concerning this procedure, the patient was given instructions to immediately contact us, at any time, without hesitation. In any case, we plan to contact the patient by telephone for a follow-up status report regarding this interventional procedure.  Comments:  No additional relevant information.  Plan of Care  Orders:  Orders Placed This Encounter  Procedures  . [facility] PA[facility] C-ARM 1-60 MIN NO REPORT    Intraoperative interpretation by procedural physician at [place].    Standing Status:   Standing    Number of Occurrences:   1    Order Specific Question:   Reason for exam:    Answer:   Assistance in needle guidance and placement for  procedures requiring needle placement in or near specific anatomical locations not easily accessible without such assistance.   Medications ordered for procedure: Meds ordered this encounter  Medications  . iohexol (OMNIPAQUE) 180 MG/ML injection 10 mL    Must be Myelogram-compatible. If not available, you may substitute with a water-soluble, non-ionic, hypoallergenic, myelogram-compatible radiological contrast medium.  Marland Kitchen lidocaine (XYLOCAINE)  2 % (with pres) injection 400 mg  . ropivacaine (PF) 2 mg/mL (0.2%) (NAROPIN) injection 1 mL  . dexamethasone (DECADRON) injection 10 mg  . dexamethasone (DECADRON) injection 10 mg   Medications administered: We administered iohexol, lidocaine, ropivacaine (PF) 2 mg/mL (0.2%), dexamethasone, and dexamethasone.  See the medical record for exact dosing, route, and time of administration.  Follow-up plan:   Return in about 4 weeks (around 02/02/2019) for Post Procedure Evaluation, virtual.     Recent Visits Date Type Provider Dept  12/25/18 Office Visit Gillis Santa, MD [facility]  Showing recent visits within past 90 days and meeting all other requirements   Today's Visits Date Type Provider Dept  01/05/19 Procedure visit Gillis Santa, MD [facility]  Showing today's visits and meeting all other requirements   Future Appointments Date Type Provider Dept  02/09/19 Appointment Gillis Santa, MD [facility]  Showing future appointments within next 90 days and meeting all other requirements   Disposition: Discharge home  Discharge Date & Time: 01/05/2019; 1200 hrs.   Primary Care Physician: Valera Castle, MD Location: [facility] Note by: Gillis Santa, MD Date: 01/05/2019; Time: 1:13 PM  Disclaimer:  Medicine is not an exact science. The only guarantee in medicine is that nothing is guaranteed. It is important to note that the decision to proceed with this intervention was  based on the information collected from the patient. The Data and conclusions were drawn from the patient's questionnaire, the interview, and the physical examination. Because the information was provided in large part by the patient, it cannot be guaranteed that it has not been purposely or unconsciously manipulated. Every effort has been made to obtain as much relevant data as possible for this evaluation. It is important to note that the conclusions that lead to this procedure are derived in large part from the available data. Always take into account that the treatment will also be dependent on availability of resources and existing treatment guidelines, considered by other Pain Management Practitioners as being common knowledge and practice, at the time of the intervention. For Medico-Legal purposes, it is also important to point out that variation in procedural techniques and pharmacological choices are the acceptable norm. The indications, contraindications, technique, and results of the above procedure should only be interpreted and judged by a Board-Certified Interventional Pain Specialist with extensive familiarity and expertise in the same exact procedure and technique.

## 2019-01-06 ENCOUNTER — Telehealth: Payer: Self-pay | Admitting: *Deleted

## 2019-01-06 NOTE — Telephone Encounter (Signed)
Attempted to call for post procedure follow-up. Message left. 

## 2019-02-04 ENCOUNTER — Telehealth: Payer: Self-pay | Admitting: *Deleted

## 2019-02-04 NOTE — Telephone Encounter (Signed)
Attempted to call for pre appointment med/allergy review. Message left. 

## 2019-02-09 ENCOUNTER — Encounter: Payer: Self-pay | Admitting: Student in an Organized Health Care Education/Training Program

## 2019-02-09 ENCOUNTER — Other Ambulatory Visit: Payer: Self-pay

## 2019-02-09 ENCOUNTER — Ambulatory Visit
Payer: PRIVATE HEALTH INSURANCE | Attending: Student in an Organized Health Care Education/Training Program | Admitting: Student in an Organized Health Care Education/Training Program

## 2019-02-09 DIAGNOSIS — M533 Sacrococcygeal disorders, not elsewhere classified: Secondary | ICD-10-CM

## 2019-02-09 DIAGNOSIS — M5416 Radiculopathy, lumbar region: Secondary | ICD-10-CM | POA: Diagnosis not present

## 2019-02-09 DIAGNOSIS — G9050 Complex regional pain syndrome I, unspecified: Secondary | ICD-10-CM | POA: Diagnosis not present

## 2019-02-09 DIAGNOSIS — M47818 Spondylosis without myelopathy or radiculopathy, sacral and sacrococcygeal region: Secondary | ICD-10-CM | POA: Diagnosis not present

## 2019-02-09 DIAGNOSIS — M5136 Other intervertebral disc degeneration, lumbar region: Secondary | ICD-10-CM | POA: Diagnosis not present

## 2019-02-09 DIAGNOSIS — G8929 Other chronic pain: Secondary | ICD-10-CM

## 2019-02-09 NOTE — Progress Notes (Signed)
Pain Management Virtual Encounter Note - Virtual Visit via Telephone Telehealth (real-time audio visits between healthcare provider and patient).   Patient's Phone No. & Preferred Pharmacy:  [phone] (home); [phone] (mobile); (Preferred) [phone] [email or web address] .[facility] [id number] Shari Prows, [facility] [address] AT [address] [place] [address] Phone: [phone] Fax: [phone]    Pre-screening note:  Our staff contacted Anthony Fuller and offered him an "in person", "face-to-face" appointment versus a telephone encounter. He indicated preferring the telephone encounter, at this time.   Reason for Virtual Visit: COVID-19*  Social distancing based on CDC and AMA recommendations.   I contacted Anthony Fuller on 02/09/2019 via telephone.      I clearly identified myself as Gillis Santa, MD. I verified that I was speaking with the correct person using two identifiers (Name: Anthony Fuller, and date of birth: [birth year]).  Advanced Informed Consent I sought verbal advanced consent from Anthony Fuller for virtual visit interactions. I informed Anthony Fuller of possible security and privacy concerns, risks, and limitations associated with providing "not-in-person" medical evaluation and management services. I also informed Anthony Fuller of the availability of "in-person" appointments. Finally, I informed him that there would be a charge for the virtual visit and that he could be  personally, fully or partially, financially responsible for it. Anthony Fuller expressed understanding and agreed to proceed.   Historic Elements   Anthony Fuller is a 58 y.o. year old, male patient evaluated today after his last encounter by our practice on 02/04/2019. Anthony Fuller  has a past medical history of Acid indigestion (02/14/2012), Anxiety, Arthritis, Bulge of lumbar disc without myelopathy (09/05/2011), Cancer ([place]) (2012), Cervical stenosis of  spine, Cervicalgia, Chest wall pain, Clostridium difficile infection (02/04/2014), DDD (degenerative disc disease), lumbar, DDD (degenerative disc disease), lumbar, Degeneration of intervertebral disc of lumbar region (08/23/2011), Diabetes mellitus ([place]) (07/24/2011), Diabetes mellitus without complication ([place]), Diabetic polyneuropathy associated with type 2 diabetes mellitus ([place]), Diarrhea, DM (diabetes mellitus) type 2, uncontrolled, with ketoacidosis ([place]) (05/30/2015), Dyspepsia, Enthesopathy of hip (04/11/2015), Fatty liver disease, nonalcoholic (123456), GERD (gastroesophageal reflux disease), Headache, Hepatic cirrhosis ([place]) (09/12/2014), Hydrocele, Hydrocele, Hypertension, IBS (irritable bowel syndrome), Lumbar herniated disc, Lumbar radiculopathy (09/05/2011), Lumbar stenosis, Peroneal tendon injury, right, initial encounter, Peroneal tendon tear, Radiculopathy, Renal insufficiency, Sleep apnea, Stenosis of cervical spine, Thoracic and lumbosacral neuritis (07/24/2011), 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 He also  has a past surgical history that includes Spine surgery; kidney removed (Right); Nephrectomy (Right, 04/2011); Ureteroscopy with holmium laser lithotripsy (Left, 05/18/2015); Cystoscopy w/ retrogrades (Left, 05/18/2015); Cystoscopy with stent placement (Left, 05/18/2015); Cardiac catheterization (Right, 06/20/2015); Ureteroscopy with holmium laser lithotripsy (Left, 06/28/2015); Cystoscopy with stent placement (Left, 06/28/2015); Esophagogastroduodenoscopy (egd) with propofol (N/A, 09/03/2016); Hallux valgus base wedge (Right, 08/23/2017); ORIF toe fracture (Right, 08/23/2017); Colonoscopy; NAFLD; Joint replacement; Back surgery; and Esophagogastroduodenoscopy endoscopy (N/A, 03/14/2018). Anthony Fuller has a current medication list which includes the following prescription(s): cetirizine, clotrimazole, dicyclomine, duloxetine, empagliflozin, gabapentin, insulin detemir, insulin nph-regular human,  lidocaine, liraglutide, metformin, metoclopramide, omeprazole, oxycodone-acetaminophen, ranitidine, tamsulosin, tizanidine, and tramadol. He  reports that he has never smoked. He has never used smokeless tobacco. He reports that he does not drink alcohol or use drugs. Anthony Fuller is allergic to hydromorphone; aspirin; and floxin [ofloxacin].   HPI  Today, he is being contacted for a post-procedure assessment.   Evaluation of last interventional procedure  01/05/2019 Procedure: B/L SI-J Injection Pre-procedure  pain score:  7/10 Post-procedure pain score: 5/10         Influential Factors: Intra-procedural challenges: None observed.         Reported side-effects: None.        Post-procedural adverse reactions or complications: None reported         Sedation: Please see nurses note for DOS. When no sedatives are used, the analgesic levels obtained are directly associated to the effectiveness of the local anesthetics. However, when sedation is provided, the level of analgesia obtained during the initial 1 hour following the intervention, is believed to be the result of a combination of factors. These factors may include, but are not limited to: 1. The effectiveness of the local anesthetics used. 2. The effects of the analgesic(s) and/or anxiolytic(s) used. 3. The degree of discomfort experienced by the patient at the time of the procedure. 4. The patients ability and reliability in recalling and recording the events. 5. The presence and influence of possible secondary gains and/or psychosocial factors. Reported result: Relief experienced during the 1st hour after the procedure:75%   (Ultra-Short Term Relief)            Interpretative annotation: Clinically appropriate result. Analgesia during this period is likely to be Local Anesthetic and/or IV Sedative (Analgesic/Anxiolytic) related.          Effects of local anesthetic: The analgesic effects attained during this period are directly associated to  the localized infiltration of local anesthetics and therefore cary significant diagnostic value as to the etiological location, or anatomical origin, of the pain. Expected duration of relief is directly dependent on the pharmacodynamics of the local anesthetic used. Long-acting (4-6 hours) anesthetics used.  Reported result: Relief during the next 4 to 6 hour after the procedure:50%   (Short-Term Relief)            Interpretative annotation: Unexpected result.             Long-term benefit: Defined as the period of time past the expected duration of local anesthetics (1 hour for short-acting and 4-6 hours for long-acting). With the possible exception of prolonged sympathetic blockade from the local anesthetics, benefits during this period are typically attributed to, or associated with, other factors such as analgesic sensory neuropraxia, antiinflammatory effects, or beneficial biochemical changes provided by agents other than the local anesthetics.  Reported result: Extended relief following procedure: 0%   (Long-Term Relief)            Interpretative annotation: Clinically possible results. No benefit.                  Laboratory Chemistry Profile (12 mo)  Renal: No results found for requested labs within last 8760 hours.  Lab Results  Component Value Date   GFRAA >60 06/24/2015   GFRNONAA 57 (L) 06/24/2015   Hepatic: No results found for requested labs within last 8760 hours. Lab Results  Component Value Date   AST 32 05/31/2014   ALT 40 05/31/2014   Other: No results found for requested labs within last 8760 hours. Note: Above Lab results reviewed.   Assessment  The primary encounter diagnosis was SI joint arthritis. Diagnoses of RSD (reflex sympathetic dystrophy), Lumbar radiculopathy, Chronic radicular lumbar pain, Lumbar degenerative disc disease, and Sacroiliac joint pain were also pertinent to this visit.  Plan of Care  I am having Anthony Fuller maintain his dicyclomine,  Omeprazole, tamsulosin, traMADol, empagliflozin, metFORMIN, liraglutide, ranitidine, tiZANidine, metoCLOPramide, oxyCODONE-acetaminophen, cetirizine, DULoxetine, gabapentin, insulin NPH-regular  Human, clotrimazole, lidocaine, and insulin detemir.  Unfortunately, no benefit after bilateral diagnostic sacroiliac joint injection.  Do not recommend repeating.  Of note patient has undergone many interventional therapies at [facility] with Dr. Carloyn Manner including lumbar facet blocks, epidural steroid injections, spinal cord stimulator trial, lumbar sympathetic block without any benefit.  At this point, I do not have any additional interventional procedures that I can offer him that could potentially help benefit the patient's pain.  Follow-up as needed  Follow-up plan:   Return if symptoms worsen or fail to improve.     Recent Visits Date Type Provider Dept  01/05/19 Procedure visit Gillis Santa, MD [facility]  12/25/18 Office Visit Gillis Santa, MD [facility]  Showing recent visits within past 90 days and meeting all other requirements   Today's Visits Date Type Provider Dept  02/09/19 Office Visit Gillis Santa, MD [facility]  Showing today's visits and meeting all other requirements   Future Appointments No visits were found meeting these conditions.  Showing future appointments within next 90 days and meeting all other requirements   I discussed the assessment and treatment plan with the patient. The patient was provided an opportunity to ask questions and all were answered. The patient agreed with the plan and demonstrated an understanding of the instructions.  Patient advised to call back or seek an in-person evaluation if the symptoms or condition worsens.  Total duration of non-face-to-face encounter: 23minutes.  Note by: Gillis Santa, MD Date: 02/09/2019; Time: 3:30 PM  Note: This dictation was prepared with Dragon dictation. Any transcriptional errors that may  result from this process are unintentional.  Disclaimer:  * Given the special circumstances of the COVID-19 pandemic, the federal government has announced that the Office for Civil Rights (OCR) will exercise its enforcement discretion and will not impose penalties on physicians using telehealth in the event of noncompliance with regulatory requirements under the [place] (HIPAA) in connection with the good faith provision of telehealth during the XX123456 national public health emergency. ([place])

## 2019-04-03 DIAGNOSIS — R310 Gross hematuria: Secondary | ICD-10-CM | POA: Insufficient documentation

## 2019-05-11 ENCOUNTER — Other Ambulatory Visit: Payer: PRIVATE HEALTH INSURANCE

## 2019-05-13 ENCOUNTER — Ambulatory Visit
Admission: RE | Admit: 2019-05-13 | Payer: PRIVATE HEALTH INSURANCE | Source: Home / Self Care | Admitting: Internal Medicine

## 2019-05-13 ENCOUNTER — Encounter: Admission: RE | Payer: Self-pay | Source: Home / Self Care

## 2019-05-13 SURGERY — COLONOSCOPY WITH PROPOFOL
Anesthesia: General

## 2019-05-28 DIAGNOSIS — R109 Unspecified abdominal pain: Secondary | ICD-10-CM | POA: Insufficient documentation

## 2019-10-22 ENCOUNTER — Other Ambulatory Visit: Payer: Self-pay | Admitting: Internal Medicine

## 2019-10-22 ENCOUNTER — Other Ambulatory Visit: Payer: Self-pay | Admitting: Physician Assistant

## 2019-10-22 DIAGNOSIS — R0602 Shortness of breath: Secondary | ICD-10-CM

## 2019-10-22 DIAGNOSIS — R400 Somnolence: Secondary | ICD-10-CM | POA: Insufficient documentation

## 2019-10-28 ENCOUNTER — Ambulatory Visit: Payer: PRIVATE HEALTH INSURANCE

## 2019-10-30 ENCOUNTER — Encounter
Admission: RE | Admit: 2019-10-30 | Discharge: 2019-10-30 | Disposition: A | Discharge status: Home / Self Care | Payer: PRIVATE HEALTH INSURANCE | Source: Ambulatory Visit | Attending: Physician Assistant | Admitting: Physician Assistant

## 2019-10-30 ENCOUNTER — Other Ambulatory Visit: Payer: Self-pay

## 2019-10-30 DIAGNOSIS — R0602 Shortness of breath: Secondary | ICD-10-CM | POA: Insufficient documentation

## 2019-10-30 LAB — NM MYOCAR MULTI W/SPECT W/WALL MOTION / EF
Estimated workload: 1 METS
Exercise duration (min): 1 min
Exercise duration (sec): 0 s
LV dias vol: 103 mL (ref 62–150)
LV sys vol: 38 mL
MPHR: 163 {beats}/min
Peak HR: 86 {beats}/min
Percent HR: 52 %
Rest HR: 67 {beats}/min
SDS: 0
SRS: 0
SSS: 0
TID: 1.08

## 2019-10-30 MED ORDER — TECHNETIUM TC 99M TETROFOSMIN IV KIT
10.7400 | PACK | Freq: Once | INTRAVENOUS | Status: AC | PRN
  Administered 2019-10-30: 10.74 via INTRAVENOUS

## 2019-10-30 MED ORDER — TECHNETIUM TC 99M TETROFOSMIN IV KIT
31.4600 | PACK | Freq: Once | INTRAVENOUS | Status: AC | PRN
  Administered 2019-10-30: 31.46 via INTRAVENOUS

## 2019-10-30 MED ORDER — REGADENOSON 0.4 MG/5ML IV SOLN
0.4000 mg | Freq: Once | INTRAVENOUS | Status: AC
  Administered 2019-10-30: 0.4 mg via INTRAVENOUS

## 2019-11-05 DIAGNOSIS — R0602 Shortness of breath: Secondary | ICD-10-CM | POA: Insufficient documentation

## 2019-11-06 ENCOUNTER — Ambulatory Visit: Payer: PRIVATE HEALTH INSURANCE

## 2019-11-24 DIAGNOSIS — G4733 Obstructive sleep apnea (adult) (pediatric): Secondary | ICD-10-CM | POA: Insufficient documentation

## 2020-04-13 ENCOUNTER — Other Ambulatory Visit: Payer: Self-pay | Admitting: Gastroenterology

## 2020-04-13 DIAGNOSIS — K7402 Hepatic fibrosis, advanced fibrosis: Secondary | ICD-10-CM

## 2020-04-13 DIAGNOSIS — R109 Unspecified abdominal pain: Secondary | ICD-10-CM

## 2020-04-13 DIAGNOSIS — G8929 Other chronic pain: Secondary | ICD-10-CM

## 2020-04-15 ENCOUNTER — Other Ambulatory Visit: Payer: Self-pay

## 2020-04-15 ENCOUNTER — Ambulatory Visit
Admission: RE | Admit: 2020-04-15 | Discharge: 2020-04-15 | Disposition: A | Discharge status: Home / Self Care | Payer: Medicare HMO | Source: Ambulatory Visit | Attending: Gastroenterology | Admitting: Gastroenterology

## 2020-04-15 DIAGNOSIS — K7402 Hepatic fibrosis, advanced fibrosis: Secondary | ICD-10-CM | POA: Insufficient documentation

## 2020-05-30 ENCOUNTER — Ambulatory Visit: Admit: 2020-05-30 | Payer: Medicare HMO

## 2020-05-30 SURGERY — COLONOSCOPY
Anesthesia: General

## 2021-02-17 ENCOUNTER — Ambulatory Visit: Payer: Self-pay | Admitting: General Surgery

## 2021-02-17 NOTE — H&P (View-Only) (Signed)
PATIENT PROFILE: Anthony Fuller is a 60 y.o. male who presents to the Clinic for consultation at the request of Dr. Kym Groom for evaluation of right inguinal hernia.  PCP:  Valera Castle, MD  HISTORY OF PRESENT ILLNESS: Anthony Fuller reports regular hernia since couple months ago.  He endorses having worsening pain in the right groin that radiates to the right testicle.  He was concerned about testicular cancer and he was evaluated by urology.  Urologist diagnosed him with the inguinal hernia.  He was also seen on the CT scan done for surveillance for kidney cancer.  I personally evaluated the images.  Pain aggravated by bending.  No alleviating factors.  Patient denies any previous history of abdominal distention nausea or vomiting.   PROBLEM LIST: Problem List  Date Reviewed: 02/09/2021          Noted   Obstructive sleep apnea 11/24/2019   SOB (shortness of breath) on exertion 11/05/2019   Daytime somnolence 10/22/2019   Left flank pain, unspecified 05/28/2019   Hematuria, gross 04/03/2019   Obesity (BMI 30-39.9), unspecified 10/03/2018   Type 2 diabetes mellitus with hyperglycemia, without long-term current use of insulin (CMS-HCC) 07/01/2018   Stage III chronic kidney disease (CMS-HCC) 07/01/2018   Spondylosis of lumbar region without myelopathy or radiculopathy 12/10/2017   Headache disorder 07/18/2017   Status post total left knee replacement 05/23/2017   Osteoarthritis of left knee 05/22/2017   Type 2 diabetes mellitus with complication, with long-term current use of insulin (CMS-HCC) (Chronic) 05/22/2017   Gastroesophageal reflux disease without esophagitis 05/20/2017   Other irritable bowel syndrome 05/20/2017   Complex regional pain syndrome type 1 of right lower extremity 08/16/2016   Closed nondisplaced fracture of fifth metatarsal bone of right foot 08/16/2016   Primary osteoarthritis of knees, bilateral 07/09/2016   Peroneal tendon tear, right, initial encounter 03/08/2016   Diabetic  polyneuropathy associated with type 2 diabetes mellitus (CMS-HCC) 02/03/2016   Status post cervical spinal fusion 07/14/2015   Essential hypertension (Chronic) 07/12/2015   Hydrocele 07/12/2015   Obesity with body mass index of 30.0-39.9, unspecified (Chronic) 07/12/2015   Thrombocytopenia (CMS-HCC) 07/12/2015   Chest wall pain, chronic (Chronic) 07/12/2015   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) 06/24/2015   Enthesopathy of left hip region 04/11/2015   NAFLD (nonalcoholic fatty liver disease) 09/12/2014   Cirrhosis of liver without ascites (CMS-HCC) 09/12/2014   Cervicalgia 08/06/2014   Pain, hip 08/26/2013   Personal history of renal cancer 06/29/2013   Dyspepsia 02/14/2012   Lower back pain 12/18/2011   Radiculopathy, lumbar region 09/05/2011   Lumbar herniated disc 09/05/2011   DDD (degenerative disc disease), lumbar (Chronic) 08/23/2011   Lumbar stenosis (Chronic) 08/23/2011   Single kidney (Chronic) 08/23/2011       GENERAL REVIEW OF SYSTEMS:   General ROS: negative for - chills, fatigue, fever, weight gain or weight loss Allergy and Immunology ROS: negative for - hives  Hematological and Lymphatic ROS: negative for - bleeding problems or bruising, negative for palpable nodes Endocrine ROS: negative for - heat or cold intolerance, hair changes Respiratory ROS: negative for - cough, shortness of breath or wheezing Cardiovascular ROS: no chest pain or palpitations GI ROS: negative for nausea, vomiting, abdominal pain, diarrhea, constipation Musculoskeletal ROS: negative for - joint swelling or muscle pain Neurological ROS: negative for - confusion, syncope Dermatological ROS: negative for pruritus and rash Psychiatric: negative for anxiety, depression, difficulty sleeping and memory loss  MEDICATIONS: Current Outpatient Medications  Medication  Sig Dispense Refill   blood glucose diagnostic, drum test strip Use 2 (two) times daily Use as instructed. 200 each 5   cetirizine  (ZYRTEC) 10 MG tablet Take 1 tablet (10 mg total) by mouth once daily 30 tablet 11   FREESTYLE LIBRE 14 DAY reader Use 1 Device as directed for up to 14 days 1 each 0   FREESTYLE LIBRE 14 DAY SENSOR kit Use 1 kit every 14 (fourteen) days for 14 days for glucose monitoring 6 kit 3   gabapentin (NEURONTIN) 400 MG capsule Take 1 capsule (400 mg total) by mouth 3 (three) times daily 90 capsule 1   insulin NPH-REGULAR (HUMULIN 70/30) 100 unit/mL (70-30) injection Inject 60 units in the morning and 55 units in the evening, max 115 units daily 40 mL 11   insulin syringe-needle U-100 1 mL 31 gauge x 5/16 syringe Use to inject insulin twice daily E11.65 200 each 3   metFORMIN (GLUCOPHAGE-XR) 500 MG XR tablet Take 2 tablets in the morning and 1 tablet in the evening 270 tablet 2   multivitamin tablet Take 1 tablet by mouth once daily     omeprazole (PRILOSEC) 40 MG DR capsule TAKE 1 CAPSULE BY MOUTH EVERY DAY 30 capsule 10   tiZANidine (ZANAFLEX) 4 MG capsule TAKE 1 CAPSULE BY MOUTH 3 TIMES A DAY 270 capsule 1   traMADoL (ULTRAM) 50 mg tablet Take 50 mg by mouth 2 (two) times daily     blood glucose meter kit Use as directed (Patient not taking: Reported on 02/16/2021) 1 each 0   empagliflozin (JARDIANCE) 25 mg tablet Take 1 tablet (25 mg total) by mouth daily with breakfast (Patient not taking: No sig reported) 30 tablet 11   lancets 30 gauge Misc Use 1 each 2 (two) times daily E11.42 (Patient not taking: Reported on 02/16/2021) 200 each 3   metoclopramide (REGLAN) 10 MG tablet Take 1 tablet (10 mg total) by mouth 3 (three) times daily before meals Take 15-20 minutes before eating (Patient not taking: No sig reported) 270 tablet 1   peg-electrolyte (NULYTELY) solution Take 4,000 mLs by mouth as directed (Patient not taking: Reported on 02/16/2021) 4000 mL 0   pen needle, diabetic (BD ULTRA-FINE NANO PEN NEEDLE) 32 gauge x 5/32" Ndle USE TWICE DAILY WITH DIABETIC MEDICATION PENS (VICTOZA AND LANTUS)  E11.22  (Patient not taking: Reported on 02/16/2021) 200 each 3   tamsulosin (FLOMAX) 0.4 mg capsule Take 1 capsule (0.4 mg total) by mouth once daily Take 30 minutes after same meal each day. (Patient not taking: Reported on 02/16/2021) 30 capsule 1   tiZANidine (ZANAFLEX) 4 MG tablet Take 1 tablet (4 mg total) by mouth 3 (three) times daily (Patient not taking: Reported on 02/16/2021) 90 tablet 11   traMADoL (ULTRAM) 50 mg tablet Take 1 tablet (50 mg total) by mouth every 8 (eight) hours as needed (Patient not taking: Reported on 02/16/2021) 60 tablet 1   No current facility-administered medications for this visit.    ALLERGIES: Hydromorphone, Aspirin, and Floxin [ofloxacin]  PAST MEDICAL HISTORY: Past Medical History:  Diagnosis Date   Allergic state 12/12   Floxin and Dilidid   Cancer (CMS-HCC) 12/12   Right kidney removed   Chronic kidney disease 04/26/11   cancer, stones and 1kidney   Chronic pain    Cirrhosis of liver (CMS-HCC)    DDD (degenerative disc disease), lumbar 08/23/2011   Diabetes mellitus type 2, uncomplicated (CMS-HCC)    Dyspepsia    Fatty  liver    GERD (gastroesophageal reflux disease) 1990   diet controlled   Gout, joint    History of Clostridium difficile    Hypertension 2012   Per hesrt Dr. Gabriel Rainwater take pills cause of 1 kidney   Kidney disease    only 1 kidney   Lumbar stenosis 08/23/2011   MRSA (methicillin resistant Staphylococcus aureus)    Right toe 12-18   Neuropathy    Obstructive sleep apnea 11/24/2019   Personal history of renal cancer 06/29/2013   Piriformis syndrome of right side 07/24/2011   Single kidney 08/23/2011   Thoracic or lumbosacral neuritis or radiculitis, unspecified 07/24/2011    PAST SURGICAL HISTORY: Past Surgical History:  Procedure Laterality Date   ARTHRODESIS ANTERIOR CERVICLE SPINE N/A 07/14/2015   Procedure: C5-C6 ANTERIOR CERVICAL DISCECTOMY AND FUSION;  Surgeon: Harvie Bridge, MD;  Location: [facility] OR;  Service: Orthopedics;   Laterality: N/A;   ARTHROPLASTY TOTAL KNEE Left 05/22/2017   Procedure: TOTAL KNEE ARTHROPLASTY;  Surgeon: Margaretha Sheffield, MD;  Location: [facility] OR;  Service: Orthopedics;  Laterality: Left;   BACK SURGERY     COLONOSCOPY  07/21/2008, 07/21/1993   FH Colon Polyps (Mother)   COLONOSCOPY  06/29/2011   Adenomatous Polyp, FH Colon Polyps (Mother)   COLONOSCOPY  11/30/2013   (Inpt) PH Adenomatous Polyp, FH Colon Polyps (Mother): [facility] 11/2018 Recall ltr mailed    EGD  06/29/2011, 07/21/2008, 02/15/1993   EGD  11/30/2013   (Inpt) Gastritis, Duodenitis: No repeat per [facility]   EGD  09/03/2016   No repeat per RTE   EGD  03/14/2018   Normal: No repeat per RTE   foot surgery     IMPLANTATION PERCUTANEOUS EPIDURAL NEUROSTIMULATORY ELECTRODES TRIAL N/A 04/08/2018   Procedure: SCS TRIAL  MEDTRONIC;  Surgeon: Hazeline Junker, MD;  Location: [facility] OR;  Service: Anes/ Pain Mgmt;  Laterality: N/A;  2 leads   INJECTION ANESTHETIC AGENT LUMBAR/SACRAL Bilateral 12/06/2014   Procedure: Bilateral L3-L4 & L4-L5 TF ESI (X4);  Surgeon: Harvie Bridge, MD;  Location: [facility] OR;  Service: Orthopedics;  Laterality: Bilateral;   INJECTION ANESTHETIC AGENT PARACERVICLE NERVE Bilateral 12/23/2017   Procedure: FACET INJECTION @ L3-L4;  Surgeon: Harvie Bridge, MD;  Location: [facility] OR;  Service: Orthopedics;  Laterality: Bilateral;   INSERTION STRUCTURAL BONE ALLOGRAFT FOR SPINE SURGERY N/A 07/14/2015   Procedure: (2) STRUCTURAL ALLOGRAFT FOR ANTERIOR CERVICAL INTERBODY FUSION C5-C6 AND C6-C7;  Surgeon: Harvie Bridge, MD;  Location: [facility] OR;  Service: Orthopedics;  Laterality: N/A;   INSTRUMENTATION ANTERIOR SPINE 2 TO 3 VERTEBRAL SEGMENTS N/A 07/14/2015   Procedure: ANTERIOR CERVICAL INSTRUMENTATION C5-C7;  Surgeon: Harvie Bridge, MD;  Location: [facility] OR;  Service: Orthopedics;  Laterality: N/A;   KNEE ARTHROSCOPY Right 1980   NEPHRECTOMY Right 04/27/2011   OSTEOTOMY CALCANEUS Right 03/06/2016   Procedure: (RCC)  OSTEOTOMY; CALCANEUS, WITH OR WITHOUT INTERNAL FIXATION;  Surgeon: Johnsie Kindred, MD;  Location: [facility] OR;  Service: Orthopedics;  Laterality: Right;   PR NJX AA&/STRD TFRML EPI LUMBAR/SACRAL 1 LEVEL Left 12/06/2014   Procedure: Lt L2-L3 TF ESI (X1);  Surgeon: Harvie Bridge, MD;  Location: [facility] OR;  Service: Orthopedics;  Laterality: Left;   REPAIR FLEXOR TENDON/POSTERIOR TIBIALIS FOOT W/O FREE GRAFT Right 03/06/2016   Procedure: REPAIR, TENDON, FLEXOR, FOOT; PRIMARY OR SECONDARY, WITHOUT FREE GRAFT, EACH TENDON-Peroneal Tendon Repair;  Surgeon: Johnsie Kindred, MD;  Location: [facility] OR;  Service: Orthopedics;  Laterality: Right;   SPINE SURGERY  09/27/2011  Mico lumb disectomy     FAMILY HISTORY: Family History  Problem Relation Age of Onset   Asthma Mother    Coronary Artery Disease (Blocked arteries around heart) Mother    Deep vein thrombosis (DVT or abnormal blood clot formation) Mother    Diabetes type II Mother    High blood pressure (Hypertension) Mother    Obesity Mother    Osteoarthritis Mother    Diabetes Mother    Heart disease Mother    Kidney disease Mother    Stroke Mother    Colon polyps Mother    Heart disease Father    Kidney disease Father    Diabetes Father    Coronary Artery Disease (Blocked arteries around heart) Father    Diabetes type II Father    High blood pressure (Hypertension) Father    Obesity Father    Stroke Father    Coronary Artery Disease (Blocked arteries around heart) Brother    Diabetes Brother    High blood pressure (Hypertension) Brother    Bipolar disorder Brother    Coronary Artery Disease (Blocked arteries around heart) Brother    Diabetes type II Brother    High blood pressure (Hypertension) Brother    Obesity Brother    Diabetes Brother    Obesity Daughter    High blood pressure (Hypertension) Brother    Anesthesia problems Neg Hx      SOCIAL HISTORY: Social History   Socioeconomic History   Marital status: Married     Spouse name: June   Number of children: 2  Tobacco Use   Smoking status: Never Smoker   Smokeless tobacco: Never Used  Scientific laboratory technician Use: Never used  Substance and Sexual Activity   Alcohol use: Not Currently   Drug use: Never   Sexual activity: Yes    Partners: Female    Birth control/protection: None    Comment: wife  Other Topics Concern   Would you please tell us about the people who live in your home, your pets, or anything else important to your social life? Yes    Comment: They are wonderful  Social History Narrative   The patient is married.  He lives with his wife and a daughter.  He denies the use of tobacco products an alcoholic beverages.  Not physically active.    PHYSICAL EXAM: Vitals:   02/16/21 1344  BP: (!) 165/88  Pulse: 79   Body mass index is 40.95 kg/m. Weight: (!) 137 kg (302 lb)   GENERAL: Alert, active, oriented x3  HEENT: Pupils equal reactive to light. Extraocular movements are intact. Sclera clear. Palpebral conjunctiva normal red color.Pharynx clear.  NECK: Supple with no palpable mass and no adenopathy.  LUNGS: Sound clear with no rales rhonchi or wheezes.  HEART: Regular rhythm S1 and S2 without murmur.  ABDOMEN: Soft and depressible, nontender with no palpable mass, no hepatomegaly.  Tenderness in the right groin without easily palpable inguinal hernia.  EXTREMITIES: Well-developed well-nourished symmetrical with no dependent edema.  NEUROLOGICAL: Awake alert oriented, facial expression symmetrical, moving all extremities.  REVIEW OF DATA: I have reviewed the following data today: Office Visit on 12/27/2020  Component Date Value   PSA (Prostate Specific A* 12/27/2020 0.62    Sodium 12/27/2020 138    Potassium 12/27/2020 4.3    Chloride 12/27/2020 103    Carbon Dioxide (CO2) 12/27/2020 24    Urea Nitrogen (BUN) 12/27/2020 23 (!)   Creatinine 12/27/2020 1.2    Glucose  12/27/2020 163 (!)   Calcium 12/27/2020 9.8    AST  (Aspartate Aminotran* 12/27/2020 27    ALT (Alanine Aminotransf* 12/27/2020 44    Bilirubin, Total 12/27/2020 0.8    Alk Phos (Alkaline Phosp* 12/27/2020 71    Albumin 12/27/2020 3.9    Protein, Total 12/27/2020 7.8    Anion Gap 12/27/2020 11    BUN/CREA Ratio 12/27/2020 19    Glomerular Filtration Ra* 12/27/2020 70    Cholesterol, Total 12/27/2020 230    LDL Calculated 12/27/2020 130    HDL 12/27/2020 43    Triglyceride 12/27/2020 285    Parathyroid Hormone (PTH* 12/27/2020 40    Phosphorus 12/27/2020 3.1    Hemoglobin A1C 12/27/2020 8.0 (!)   Average Blood Glucose (C* 12/27/2020 188    DPC Microalbumin/Creatin* 12/27/2020 30-300 (!)     ASSESSMENT: Mr. Henner is a 60 y.o. male presenting for consultation for right versus bilateral inguinal hernia.    The patient presents with a symptomatic, reducible right inguinal hernia. Patient was oriented about the diagnosis of inguinal hernia and its implication. The patient was oriented about the treatment alternatives (observation vs surgical repair). Due to patient symptoms, repair is recommended. Patient oriented about the surgical procedure, the use of mesh and its risk of complications such as: infection, bleeding, injury to vas deference, vasculature and testicle, injury to bowel or bladder, and chronic pain.   Patient was oriented about increase risk of surgical complication due to uncontrolled diabetes and obesity.  He was also notified about increased risk of recurrence.  Due to the worsening pain that is getting into the testicles I think that is reasonable to proceed with elective hernia repair instead of waiting for patient to get an incarcerated versus strangulated inguinal hernia.  He does understand the high risk of complication or recurrence.  Non-recurrent unilateral inguinal hernia without obstruction or gangrene [K40.90]  PLAN: 1.  Robotic assisted laparoscopic right vs bilateral inguinal hernia repair with mesh ([id number]) 2.   Avoid taking aspirin 5 days before procedure 3.  Contact us if has any question or concern.   Patient and his wife verbalized understanding, all questions were answered, and were agreeable with the plan outlined above.    Herbert Pun, MD  Electronically signed by Herbert Pun, MD

## 2021-02-17 NOTE — H&P (Signed)
PATIENT PROFILE: Anthony Fuller is a 60 y.o. male who presents to the Clinic for consultation at the request of Dr. Kym Groom for evaluation of right inguinal hernia.  PCP:  Valera Castle, MD  HISTORY OF PRESENT ILLNESS: Anthony Fuller reports regular hernia since couple months ago.  He endorses having worsening pain in the right groin that radiates to the right testicle.  He was concerned about testicular cancer and he was evaluated by urology.  Urologist diagnosed him with the inguinal hernia.  He was also seen on the CT scan done for surveillance for kidney cancer.  I personally evaluated the images.  Pain aggravated by bending.  No alleviating factors.  Patient denies any previous history of abdominal distention nausea or vomiting.   PROBLEM LIST: Problem List  Date Reviewed: 02/09/2021          Noted   Obstructive sleep apnea 11/24/2019   SOB (shortness of breath) on exertion 11/05/2019   Daytime somnolence 10/22/2019   Left flank pain, unspecified 05/28/2019   Hematuria, gross 04/03/2019   Obesity (BMI 30-39.9), unspecified 10/03/2018   Type 2 diabetes mellitus with hyperglycemia, without long-term current use of insulin (CMS-HCC) 07/01/2018   Stage III chronic kidney disease (CMS-HCC) 07/01/2018   Spondylosis of lumbar region without myelopathy or radiculopathy 12/10/2017   Headache disorder 07/18/2017   Status post total left knee replacement 05/23/2017   Osteoarthritis of left knee 05/22/2017   Type 2 diabetes mellitus with complication, with long-term current use of insulin (CMS-HCC) (Chronic) 05/22/2017   Gastroesophageal reflux disease without esophagitis 05/20/2017   Other irritable bowel syndrome 05/20/2017   Complex regional pain syndrome type 1 of right lower extremity 08/16/2016   Closed nondisplaced fracture of fifth metatarsal bone of right foot 08/16/2016   Primary osteoarthritis of knees, bilateral 07/09/2016   Peroneal tendon tear, right, initial encounter 03/08/2016   Diabetic  polyneuropathy associated with type 2 diabetes mellitus (CMS-HCC) 02/03/2016   Status post cervical spinal fusion 07/14/2015   Essential hypertension (Chronic) 07/12/2015   Hydrocele 07/12/2015   Obesity with body mass index of 30.0-39.9, unspecified (Chronic) 07/12/2015   Thrombocytopenia (CMS-HCC) 07/12/2015   Chest wall pain, chronic (Chronic) 07/12/2015   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) 06/24/2015   Enthesopathy of left hip region 04/11/2015   NAFLD (nonalcoholic fatty liver disease) 09/12/2014   Cirrhosis of liver without ascites (CMS-HCC) 09/12/2014   Cervicalgia 08/06/2014   Pain, hip 08/26/2013   Personal history of renal cancer 06/29/2013   Dyspepsia 02/14/2012   Lower back pain 12/18/2011   Radiculopathy, lumbar region 09/05/2011   Lumbar herniated disc 09/05/2011   DDD (degenerative disc disease), lumbar (Chronic) 08/23/2011   Lumbar stenosis (Chronic) 08/23/2011   Single kidney (Chronic) 08/23/2011       GENERAL REVIEW OF SYSTEMS:   General ROS: negative for - chills, fatigue, fever, weight gain or weight loss Allergy and Immunology ROS: negative for - hives  Hematological and Lymphatic ROS: negative for - bleeding problems or bruising, negative for palpable nodes Endocrine ROS: negative for - heat or cold intolerance, hair changes Respiratory ROS: negative for - cough, shortness of breath or wheezing Cardiovascular ROS: no chest pain or palpitations GI ROS: negative for nausea, vomiting, abdominal pain, diarrhea, constipation Musculoskeletal ROS: negative for - joint swelling or muscle pain Neurological ROS: negative for - confusion, syncope Dermatological ROS: negative for pruritus and rash Psychiatric: negative for anxiety, depression, difficulty sleeping and memory loss  MEDICATIONS: Current Outpatient Medications  Medication  Sig Dispense Refill   blood glucose diagnostic, drum test strip Use 2 (two) times daily Use as instructed. 200 each 5   cetirizine  (ZYRTEC) 10 MG tablet Take 1 tablet (10 mg total) by mouth once daily 30 tablet 11   FREESTYLE LIBRE 14 DAY reader Use 1 Device as directed for up to 14 days 1 each 0   FREESTYLE LIBRE 14 DAY SENSOR kit Use 1 kit every 14 (fourteen) days for 14 days for glucose monitoring 6 kit 3   gabapentin (NEURONTIN) 400 MG capsule Take 1 capsule (400 mg total) by mouth 3 (three) times daily 90 capsule 1   insulin NPH-REGULAR (HUMULIN 70/30) 100 unit/mL (70-30) injection Inject 60 units in the morning and 55 units in the evening, max 115 units daily 40 mL 11   insulin syringe-needle U-100 1 mL 31 gauge x 5/16 syringe Use to inject insulin twice daily E11.65 200 each 3   metFORMIN (GLUCOPHAGE-XR) 500 MG XR tablet Take 2 tablets in the morning and 1 tablet in the evening 270 tablet 2   multivitamin tablet Take 1 tablet by mouth once daily     omeprazole (PRILOSEC) 40 MG DR capsule TAKE 1 CAPSULE BY MOUTH EVERY DAY 30 capsule 10   tiZANidine (ZANAFLEX) 4 MG capsule TAKE 1 CAPSULE BY MOUTH 3 TIMES A DAY 270 capsule 1   traMADoL (ULTRAM) 50 mg tablet Take 50 mg by mouth 2 (two) times daily     blood glucose meter kit Use as directed (Patient not taking: Reported on 02/16/2021) 1 each 0   empagliflozin (JARDIANCE) 25 mg tablet Take 1 tablet (25 mg total) by mouth daily with breakfast (Patient not taking: No sig reported) 30 tablet 11   lancets 30 gauge Misc Use 1 each 2 (two) times daily E11.42 (Patient not taking: Reported on 02/16/2021) 200 each 3   metoclopramide (REGLAN) 10 MG tablet Take 1 tablet (10 mg total) by mouth 3 (three) times daily before meals Take 15-20 minutes before eating (Patient not taking: No sig reported) 270 tablet 1   peg-electrolyte (NULYTELY) solution Take 4,000 mLs by mouth as directed (Patient not taking: Reported on 02/16/2021) 4000 mL 0   pen needle, diabetic (BD ULTRA-FINE NANO PEN NEEDLE) 32 gauge x 5/32" Ndle USE TWICE DAILY WITH DIABETIC MEDICATION PENS (VICTOZA AND LANTUS)  E11.22  (Patient not taking: Reported on 02/16/2021) 200 each 3   tamsulosin (FLOMAX) 0.4 mg capsule Take 1 capsule (0.4 mg total) by mouth once daily Take 30 minutes after same meal each day. (Patient not taking: Reported on 02/16/2021) 30 capsule 1   tiZANidine (ZANAFLEX) 4 MG tablet Take 1 tablet (4 mg total) by mouth 3 (three) times daily (Patient not taking: Reported on 02/16/2021) 90 tablet 11   traMADoL (ULTRAM) 50 mg tablet Take 1 tablet (50 mg total) by mouth every 8 (eight) hours as needed (Patient not taking: Reported on 02/16/2021) 60 tablet 1   No current facility-administered medications for this visit.    ALLERGIES: Hydromorphone, Aspirin, and Floxin [ofloxacin]  PAST MEDICAL HISTORY: Past Medical History:  Diagnosis Date   Allergic state 12/12   Floxin and Dilidid   Cancer (CMS-HCC) 12/12   Right kidney removed   Chronic kidney disease 04/26/11   cancer, stones and 1kidney   Chronic pain    Cirrhosis of liver (CMS-HCC)    DDD (degenerative disc disease), lumbar 08/23/2011   Diabetes mellitus type 2, uncomplicated (CMS-HCC)    Dyspepsia    Fatty  liver    GERD (gastroesophageal reflux disease) 1990   diet controlled   Gout, joint    History of Clostridium difficile    Hypertension 2012   Per hesrt Dr. Sharyl Nimrod take pills cause of 1 kidney   Kidney disease    only 1 kidney   Lumbar stenosis 08/23/2011   MRSA (methicillin resistant Staphylococcus aureus)    Right toe 12-18   Neuropathy    Obstructive sleep apnea 11/24/2019   Personal history of renal cancer 06/29/2013   Piriformis syndrome of right side 07/24/2011   Single kidney 08/23/2011   Thoracic or lumbosacral neuritis or radiculitis, unspecified 07/24/2011    PAST SURGICAL HISTORY: Past Surgical History:  Procedure Laterality Date   ARTHRODESIS ANTERIOR CERVICLE SPINE N/A 07/14/2015   Procedure: C5-C6 ANTERIOR CERVICAL DISCECTOMY AND FUSION;  Surgeon: Bobby Rumpf, MD;  Location: [place];  Service: Orthopedics;   Laterality: N/A;   ARTHROPLASTY TOTAL KNEE Left 05/22/2017   Procedure: TOTAL KNEE ARTHROPLASTY;  Surgeon: Maxcine Ham, MD;  Location: [place];  Service: Orthopedics;  Laterality: Left;   BACK SURGERY     COLONOSCOPY  07/21/2008, 07/21/1993   FH Colon Polyps (Mother)   COLONOSCOPY  06/29/2011   Adenomatous Polyp, FH Colon Polyps (Mother)   COLONOSCOPY  11/30/2013   (Inpt) PH Adenomatous Polyp, FH Colon Polyps (Mother): CBF 11/2018 Recall ltr mailed    EGD  06/29/2011, 07/21/2008, 02/15/1993   EGD  11/30/2013   (Inpt) Gastritis, Duodenitis: No repeat per [facility]   EGD  09/03/2016   No repeat per RTE   EGD  03/14/2018   Normal: No repeat per RTE   foot surgery     IMPLANTATION PERCUTANEOUS EPIDURAL NEUROSTIMULATORY ELECTRODES TRIAL N/A 04/08/2018   Procedure: SCS TRIAL  [employer];  Surgeon: Linward Natal, MD;  Location: [facility];  Service: Anes/ Pain Mgmt;  Laterality: N/A;  2 leads   INJECTION ANESTHETIC AGENT LUMBAR/SACRAL Bilateral 12/06/2014   Procedure: Bilateral L3-L4 & L4-L5 TF ESI (X4);  Surgeon: Bobby Rumpf, MD;  Location: [facility];  Service: Orthopedics;  Laterality: Bilateral;   INJECTION ANESTHETIC AGENT PARACERVICLE NERVE Bilateral 12/23/2017   Procedure: FACET INJECTION @ L3-L4;  Surgeon: Bobby Rumpf, MD;  Location: [facility];  Service: Orthopedics;  Laterality: Bilateral;   INSERTION STRUCTURAL BONE ALLOGRAFT FOR SPINE SURGERY N/A 07/14/2015   Procedure: (2) STRUCTURAL ALLOGRAFT FOR ANTERIOR CERVICAL INTERBODY FUSION C5-C6 AND C6-C7;  Surgeon: Bobby Rumpf, MD;  Location: [place];  Service: Orthopedics;  Laterality: N/A;   INSTRUMENTATION ANTERIOR SPINE 2 TO 3 VERTEBRAL SEGMENTS N/A 07/14/2015   Procedure: ANTERIOR CERVICAL INSTRUMENTATION C5-C7;  Surgeon: Bobby Rumpf, MD;  Location: [place];  Service: Orthopedics;  Laterality: N/A;   KNEE ARTHROSCOPY Right 1980   NEPHRECTOMY Right 04/27/2011   OSTEOTOMY CALCANEUS Right 03/06/2016   Procedure: (RCC)  OSTEOTOMY; CALCANEUS, WITH OR WITHOUT INTERNAL FIXATION;  Surgeon: Margret Chance, MD;  Location[facility];  Service: Orthopedics;  Laterality: Right;   PR NJX AA&/STRD TFRML EPI LUMBAR/SACRAL 1 LEVEL Left 12/06/2014   Procedure: Lt L2-L3 TF ESI (X1);  Surgeon: Bobby Rumpf, MD;  Location: [facility];  Service: Orthopedics;  Laterality: Left;   REPAIR FLEXOR TENDON/POSTERIOR TIBIALIS FOOT W/O FREE GRAFT Right 03/06/2016   Procedure: REPAIR, TENDON, FLEXOR, FOOT; PRIMARY OR SECONDARY, WITHOUT FREE GRAFT, EACH TENDON-Peroneal Tendon Repair;  Surgeon: Margret Chance, MD;  Location: [facility];  Service: Orthopedics;  Laterality: Right;   SPINE SURGERY  09/27/2011  [place] lumb disectomy     FAMILY HISTORY: Family History  Problem Relation Age of Onset   Asthma Mother    Coronary Artery Disease (Blocked arteries around heart) Mother    Deep vein thrombosis (DVT or abnormal blood clot formation) Mother    Diabetes type II Mother    High blood pressure (Hypertension) Mother    Obesity Mother    Osteoarthritis Mother    Diabetes Mother    Heart disease Mother    Kidney disease Mother    Stroke Mother    Colon polyps Mother    Heart disease Father    Kidney disease Father    Diabetes Father    Coronary Artery Disease (Blocked arteries around heart) Father    Diabetes type II Father    High blood pressure (Hypertension) Father    Obesity Father    Stroke Father    Coronary Artery Disease (Blocked arteries around heart) Brother    Diabetes Brother    High blood pressure (Hypertension) Brother    Bipolar disorder Brother    Coronary Artery Disease (Blocked arteries around heart) Brother    Diabetes type II Brother    High blood pressure (Hypertension) Brother    Obesity Brother    Diabetes Brother    Obesity Daughter    High blood pressure (Hypertension) Brother    Anesthesia problems Neg Hx      SOCIAL HISTORY: Social History   Socioeconomic History   Marital status: Married     Spouse name: June   Number of children: 2  Tobacco Use   Smoking status: Never Smoker   Smokeless tobacco: Never Used  Scientific laboratory technician Use: Never used  Substance and Sexual Activity   Alcohol use: Not Currently   Drug use: Never   Sexual activity: Yes    Partners: Female    Birth control/protection: None    Comment: wife  Other Topics Concern   Would you please tell us about the people who live in your home, your pets, or anything else important to your social life? Yes    Comment: They are wonderful  Social History Narrative   The patient is married.  He lives with his wife and a daughter.  He denies the use of tobacco products an alcoholic beverages.  Not physically active.    PHYSICAL EXAM: Vitals:   02/16/21 1344  BP: (!) 165/88  Pulse: 79   Body mass index is 40.95 kg/m. Weight: (!) 137 kg (302 lb)   GENERAL: Alert, active, oriented x3  HEENT: Pupils equal reactive to light. Extraocular movements are intact. Sclera clear. Palpebral conjunctiva normal red color.Pharynx clear.  NECK: Supple with no palpable mass and no adenopathy.  LUNGS: Sound clear with no rales rhonchi or wheezes.  HEART: Regular rhythm S1 and S2 without murmur.  ABDOMEN: Soft and depressible, nontender with no palpable mass, no hepatomegaly.  Tenderness in the right groin without easily palpable inguinal hernia.  EXTREMITIES: Well-developed well-nourished symmetrical with no dependent edema.  NEUROLOGICAL: Awake alert oriented, facial expression symmetrical, moving all extremities.  REVIEW OF DATA: I have reviewed the following data today: Office Visit on 12/27/2020  Component Date Value   PSA (Prostate Specific A* 12/27/2020 0.62    Sodium 12/27/2020 138    Potassium 12/27/2020 4.3    Chloride 12/27/2020 103    Carbon Dioxide (CO2) 12/27/2020 24    Urea Nitrogen (BUN) 12/27/2020 23 (!)   Creatinine 12/27/2020 1.2    Glucose  12/27/2020 163 (!)   Calcium 12/27/2020 9.8    AST  (Aspartate Aminotran* 12/27/2020 27    ALT (Alanine Aminotransf* 12/27/2020 44    Bilirubin, Total 12/27/2020 0.8    Alk Phos (Alkaline Phosp* 12/27/2020 71    Albumin 12/27/2020 3.9    Protein, Total 12/27/2020 7.8    Anion Gap 12/27/2020 11    BUN/CREA Ratio 12/27/2020 19    Glomerular Filtration Ra* 12/27/2020 70    Cholesterol, Total 12/27/2020 230    LDL Calculated 12/27/2020 130    HDL 12/27/2020 43    Triglyceride 12/27/2020 285    Parathyroid Hormone (PTH* 12/27/2020 40    Phosphorus 12/27/2020 3.1    Hemoglobin A1C 12/27/2020 8.0 (!)   Average Blood Glucose (C* 12/27/2020 188    DPC Microalbumin/Creatin* 12/27/2020 30-300 (!)     ASSESSMENT: Anthony Fuller is a 60 y.o. male presenting for consultation for right versus bilateral inguinal hernia.    The patient presents with a symptomatic, reducible right inguinal hernia. Patient was oriented about the diagnosis of inguinal hernia and its implication. The patient was oriented about the treatment alternatives (observation vs surgical repair). Due to patient symptoms, repair is recommended. Patient oriented about the surgical procedure, the use of mesh and its risk of complications such as: infection, bleeding, injury to vas deference, vasculature and testicle, injury to bowel or bladder, and chronic pain.   Patient was oriented about increase risk of surgical complication due to uncontrolled diabetes and obesity.  He was also notified about increased risk of recurrence.  Due to the worsening pain that is getting into the testicles I think that is reasonable to proceed with elective hernia repair instead of waiting for patient to get an incarcerated versus strangulated inguinal hernia.  He does understand the high risk of complication or recurrence.  Non-recurrent unilateral inguinal hernia without obstruction or gangrene [K40.90]  PLAN: 1.  Robotic assisted laparoscopic right vs bilateral inguinal hernia repair with mesh ([id number]) 2.   Avoid taking aspirin 5 days before procedure 3.  Contact us if has any question or concern.   Patient and his wife verbalized understanding, all questions were answered, and were agreeable with the plan outlined above.    Herbert Pun, MD  Electronically signed by Herbert Pun, MD

## 2021-02-21 ENCOUNTER — Other Ambulatory Visit (INDEPENDENT_AMBULATORY_CARE_PROVIDER_SITE_OTHER): Payer: Self-pay | Admitting: Vascular Surgery

## 2021-02-21 ENCOUNTER — Other Ambulatory Visit: Payer: Self-pay

## 2021-02-21 ENCOUNTER — Encounter
Admission: RE | Admit: 2021-02-21 | Discharge: 2021-02-21 | Disposition: A | Discharge status: Home / Self Care | Payer: Medicare HMO | Source: Ambulatory Visit | Attending: General Surgery | Admitting: General Surgery

## 2021-02-21 DIAGNOSIS — I83893 Varicose veins of bilateral lower extremities with other complications: Secondary | ICD-10-CM

## 2021-02-21 NOTE — Patient Instructions (Addendum)
Your procedure is scheduled on: Monday 02/27/21 Report to the Registration Desk on the 1st floor of the [facility]. To find out your arrival time, please call [phone] between 1PM - 3PM on: Friday 02/24/21  REMEMBER: Instructions that are not followed completely may result in serious medical risk, up to and including death; or upon the discretion of your surgeon and anesthesiologist your surgery may need to be rescheduled.  Do not eat food after midnight the night before surgery.  No gum chewing, lozengers or hard candies.  You may however, drink water up to 2 hours before you are scheduled to arrive for your surgery. Do not drink anything within 2 hours of your scheduled arrival time.  Clear liquids include: - water   Type 1 and Type 2 diabetics should only drink water.   TAKE THESE MEDICATIONS THE MORNING OF SURGERY WITH A SIP OF WATER: gabapentin (NEURONTIN) 400 MG capsule  omeprazole (PRILOSEC) 40 MG capsule (take one the night before and one on the morning of surgery - helps to prevent nausea after surgery.)  Sunday take prescribed dose of insulin NPH-regular Human (70-30) 100 UNIT/ML injection for breakfast and half a dose at night and none the morning of surgery.  Take Metformin Friday as prescribed and none until after surgery.   One week prior to surgery: Stop Anti-inflammatories (NSAIDS) such as Advil, Aleve, Ibuprofen, Motrin, Naproxen, Naprosyn and Aspirin based products such as Excedrin, Goodys Powder, BC Powder. Stop ANY OVER THE COUNTER supplements until after surgery such as Multiple Vitamin (MULTIVITAMIN WITH MINERALS) TABS tablet and anything else you may take.  You may however, continue to take Tylenol if needed for pain up until the day of surgery.  No Alcohol for 24 hours before or after surgery.  No Smoking including e-cigarettes for 24 hours prior to surgery.  No chewable tobacco products for at least 6 hours prior to surgery.  No nicotine patches  on the day of surgery.  Do not use any "recreational" drugs for at least a week prior to your surgery.  Please be advised that the combination of cocaine and anesthesia may have negative outcomes, up to and including death. If you test positive for cocaine, your surgery will be cancelled.  On the morning of surgery brush your teeth with toothpaste and water, you may rinse your mouth with mouthwash if you wish. Do not swallow any toothpaste or mouthwash.  Use CHG Soap or wipes as directed on instruction sheet.  Do not wear jewelry.  Do not wear lotions, powders, or colognes.   Do not shave body from the neck down 48 hours prior to surgery just in case you cut yourself which could leave a site for infection.  Also, freshly shaved skin may become irritated if using the CHG soap.  Do not bring valuables to the hospital. [facility] is not responsible for any missing/lost belongings or valuables.   Notify your doctor if there is any change in your medical condition (cold, fever, infection).  Wear comfortable clothing (specific to your surgery type) to the hospital.  After surgery, you can help prevent lung complications by doing breathing exercises.  Take deep breaths and cough every 1-2 hours. Your doctor may order a device called an Incentive Spirometer to help you take deep breaths. When coughing or sneezing, hold a pillow firmly against your incision with both hands. This is called "splinting." Doing this helps protect your incision. It also decreases belly discomfort.  If you are being discharged  the day of surgery, you will not be allowed to drive home. You will need a responsible adult (18 years or older) to drive you home and stay with you that night.   If you are taking public transportation, you will need to have a responsible adult (18 years or older) with you. Please confirm with your physician that it is acceptable to use public transportation.   Please call the  [place] Dept. at [phone] if you have any questions about these instructions.  Surgery Visitation Policy:  Patients undergoing a surgery or procedure may have one family member or support person with them as long as that person is not COVID-19 positive or experiencing its symptoms.  That person may remain in the waiting area during the procedure and may rotate out with other people.  Inpatient Visitation:    Visiting hours are 7 a.m. to 8 p.m. Up to two visitors ages 16+ are allowed at one time in a patient room. The visitors may rotate out with other people during the day. Visitors must check out when they leave, or other visitors will not be allowed. One designated support person may remain overnight. The visitor must pass COVID-19 screenings, use hand sanitizer when entering and exiting the patient's room and wear a mask at all times, including in the patient's room. Patients must also wear a mask when staff or their visitor are in the room. Masking is required regardless of vaccination status.

## 2021-02-22 ENCOUNTER — Ambulatory Visit (INDEPENDENT_AMBULATORY_CARE_PROVIDER_SITE_OTHER): Payer: Medicare HMO

## 2021-02-22 ENCOUNTER — Encounter (INDEPENDENT_AMBULATORY_CARE_PROVIDER_SITE_OTHER): Payer: Self-pay | Admitting: Nurse Practitioner

## 2021-02-22 ENCOUNTER — Ambulatory Visit (INDEPENDENT_AMBULATORY_CARE_PROVIDER_SITE_OTHER): Payer: Medicare HMO | Admitting: Nurse Practitioner

## 2021-02-22 VITALS — BP 171/110 | HR 78 | Resp 16 | Ht 74.0 in | Wt 301.0 lb

## 2021-02-22 DIAGNOSIS — I83893 Varicose veins of bilateral lower extremities with other complications: Secondary | ICD-10-CM

## 2021-02-22 DIAGNOSIS — R0989 Other specified symptoms and signs involving the circulatory and respiratory systems: Secondary | ICD-10-CM | POA: Diagnosis not present

## 2021-02-23 ENCOUNTER — Other Ambulatory Visit: Payer: Self-pay

## 2021-02-23 ENCOUNTER — Encounter
Admission: RE | Admit: 2021-02-23 | Discharge: 2021-02-23 | Disposition: A | Discharge status: Home / Self Care | Payer: Medicare HMO | Source: Ambulatory Visit | Attending: General Surgery | Admitting: General Surgery

## 2021-02-23 DIAGNOSIS — Z01818 Encounter for other preprocedural examination: Secondary | ICD-10-CM | POA: Diagnosis not present

## 2021-02-23 LAB — CBC
HCT: 49.6 % (ref 39.0–52.0)
Hemoglobin: 16.5 g/dL (ref 13.0–17.0)
MCH: 28.7 pg (ref 26.0–34.0)
MCHC: 33.3 g/dL (ref 30.0–36.0)
MCV: 86.3 fL (ref 80.0–100.0)
Platelets: 177 10*3/uL (ref 150–400)
RBC: 5.75 MIL/uL (ref 4.22–5.81)
RDW: 13.4 % (ref 11.5–15.5)
WBC: 6.6 10*3/uL (ref 4.0–10.5)
nRBC: 0 % (ref 0.0–0.2)

## 2021-02-26 MED ORDER — CEFAZOLIN SODIUM-DEXTROSE 2-4 GM/100ML-% IV SOLN
2.0000 g | INTRAVENOUS | Status: AC
  Administered 2021-02-27: 1 g via INTRAVENOUS
  Administered 2021-02-27: 2 g via INTRAVENOUS

## 2021-02-26 MED ORDER — ORAL CARE MOUTH RINSE: 15.0000 mL | Freq: Once | OROMUCOSAL | Status: AC

## 2021-02-26 MED ORDER — CHLORHEXIDINE GLUCONATE 0.12 % MT SOLN: 15.0000 mL | Freq: Once | OROMUCOSAL | Status: AC

## 2021-02-26 MED ORDER — SODIUM CHLORIDE 0.9 % IV SOLN: INTRAVENOUS | Status: DC

## 2021-02-27 ENCOUNTER — Encounter: Payer: Self-pay | Admitting: General Surgery

## 2021-02-27 ENCOUNTER — Ambulatory Visit
Admission: RE | Admit: 2021-02-27 | Discharge: 2021-02-27 | Disposition: A | Discharge status: Home / Self Care | Payer: Medicare HMO | Attending: General Surgery | Admitting: General Surgery

## 2021-02-27 ENCOUNTER — Ambulatory Visit: Payer: Medicare HMO | Admitting: Anesthesiology

## 2021-02-27 ENCOUNTER — Other Ambulatory Visit: Payer: Self-pay

## 2021-02-27 ENCOUNTER — Encounter
Admission: RE | Disposition: A | Discharge status: Home / Self Care | Payer: Self-pay | Source: Home / Self Care | Attending: General Surgery

## 2021-02-27 DIAGNOSIS — Z905 Acquired absence of kidney: Secondary | ICD-10-CM | POA: Insufficient documentation

## 2021-02-27 DIAGNOSIS — E1122 Type 2 diabetes mellitus with diabetic chronic kidney disease: Secondary | ICD-10-CM | POA: Insufficient documentation

## 2021-02-27 DIAGNOSIS — N183 Chronic kidney disease, stage 3 unspecified: Secondary | ICD-10-CM | POA: Insufficient documentation

## 2021-02-27 DIAGNOSIS — K4020 Bilateral inguinal hernia, without obstruction or gangrene, not specified as recurrent: Secondary | ICD-10-CM | POA: Insufficient documentation

## 2021-02-27 DIAGNOSIS — Z85528 Personal history of other malignant neoplasm of kidney: Secondary | ICD-10-CM | POA: Diagnosis not present

## 2021-02-27 DIAGNOSIS — Z885 Allergy status to narcotic agent status: Secondary | ICD-10-CM | POA: Diagnosis not present

## 2021-02-27 DIAGNOSIS — Z96652 Presence of left artificial knee joint: Secondary | ICD-10-CM | POA: Insufficient documentation

## 2021-02-27 DIAGNOSIS — I129 Hypertensive chronic kidney disease with stage 1 through stage 4 chronic kidney disease, or unspecified chronic kidney disease: Secondary | ICD-10-CM | POA: Diagnosis not present

## 2021-02-27 DIAGNOSIS — Z794 Long term (current) use of insulin: Secondary | ICD-10-CM | POA: Diagnosis not present

## 2021-02-27 DIAGNOSIS — Z886 Allergy status to analgesic agent status: Secondary | ICD-10-CM | POA: Diagnosis not present

## 2021-02-27 DIAGNOSIS — Z881 Allergy status to other antibiotic agents status: Secondary | ICD-10-CM | POA: Insufficient documentation

## 2021-02-27 HISTORY — PX: INSERTION OF MESH: SHX5868

## 2021-02-27 LAB — GLUCOSE, CAPILLARY
Glucose-Capillary: 296 mg/dL — ABNORMAL HIGH (ref 70–99)
Glucose-Capillary: 193 mg/dL — ABNORMAL HIGH (ref 70–99)

## 2021-02-27 SURGERY — REPAIR, HERNIA, INGUINAL, BILATERAL, ROBOT-ASSISTED
Anesthesia: General | Site: Groin | Laterality: Bilateral

## 2021-02-27 MED ORDER — BUPIVACAINE-EPINEPHRINE (PF) 0.25% -1:200000 IJ SOLN
INTRAMUSCULAR | Status: AC
  Filled 2021-02-27: qty 30

## 2021-02-27 MED ORDER — EPHEDRINE SULFATE 50 MG/ML IJ SOLN
INTRAMUSCULAR | Status: DC | PRN
  Administered 2021-02-27: 5 mg via INTRAVENOUS

## 2021-02-27 MED ORDER — CHLORHEXIDINE GLUCONATE 0.12 % MT SOLN
OROMUCOSAL | Status: AC
  Administered 2021-02-27: 15 mL via OROMUCOSAL
  Filled 2021-02-27: qty 15

## 2021-02-27 MED ORDER — ONDANSETRON HCL 4 MG/2ML IJ SOLN
INTRAMUSCULAR | Status: DC | PRN
  Administered 2021-02-27 (×2): 4 mg via INTRAVENOUS

## 2021-02-27 MED ORDER — TRAMADOL HCL 50 MG PO TABS: 50.0000 mg | ORAL_TABLET | Freq: Four times a day (QID) | ORAL | 0 refills | Status: DC | PRN

## 2021-02-27 MED ORDER — FENTANYL CITRATE (PF) 100 MCG/2ML IJ SOLN
INTRAMUSCULAR | Status: AC
  Filled 2021-02-27: qty 2

## 2021-02-27 MED ORDER — SUCCINYLCHOLINE CHLORIDE 200 MG/10ML IV SOSY
PREFILLED_SYRINGE | INTRAVENOUS | Status: DC | PRN
Start: 2021-02-27 — End: 2021-02-27
  Administered 2021-02-27: 140 mg via INTRAVENOUS

## 2021-02-27 MED ORDER — SUGAMMADEX SODIUM 500 MG/5ML IV SOLN
INTRAVENOUS | Status: DC | PRN
  Administered 2021-02-27: 500 mg via INTRAVENOUS

## 2021-02-27 MED ORDER — FENTANYL CITRATE (PF) 100 MCG/2ML IJ SOLN
INTRAMUSCULAR | Status: DC | PRN
  Administered 2021-02-27 (×4): 50 ug via INTRAVENOUS

## 2021-02-27 MED ORDER — INSULIN REGULAR(HUMAN) IN NACL 100-0.9 UT/100ML-% IV SOLN
INTRAVENOUS | Status: DC | PRN
  Administered 2021-02-27: 5 [IU]/h via INTRAVENOUS

## 2021-02-27 MED ORDER — GLYCOPYRROLATE 0.2 MG/ML IJ SOLN
INTRAMUSCULAR | Status: DC | PRN
  Administered 2021-02-27: .2 mg via INTRAVENOUS

## 2021-02-27 MED ORDER — ONDANSETRON HCL 4 MG/2ML IJ SOLN
4.0000 mg | Freq: Once | INTRAMUSCULAR | Status: DC | PRN
Start: 2021-02-27 — End: 2021-02-27

## 2021-02-27 MED ORDER — TRAMADOL HCL 50 MG PO TABS
50.0000 mg | ORAL_TABLET | Freq: Four times a day (QID) | ORAL | Status: DC | PRN
  Administered 2021-02-27: 50 mg via ORAL

## 2021-02-27 MED ORDER — INSULIN ASPART 100 UNIT/ML IJ SOLN
INTRAMUSCULAR | Status: AC
  Filled 2021-02-27: qty 1

## 2021-02-27 MED ORDER — MIDAZOLAM HCL 2 MG/2ML IJ SOLN
INTRAMUSCULAR | Status: AC
  Filled 2021-02-27: qty 2

## 2021-02-27 MED ORDER — PROPOFOL 10 MG/ML IV BOLUS
INTRAVENOUS | Status: DC | PRN
  Administered 2021-02-27: 200 mg via INTRAVENOUS

## 2021-02-27 MED ORDER — PHENYLEPHRINE HCL (PRESSORS) 10 MG/ML IV SOLN
INTRAVENOUS | Status: AC
  Filled 2021-02-27: qty 1

## 2021-02-27 MED ORDER — VISTASEAL 10 ML SINGLE DOSE KIT
PACK | CUTANEOUS | Status: AC
  Filled 2021-02-27: qty 10

## 2021-02-27 MED ORDER — CEFAZOLIN SODIUM-DEXTROSE 2-4 GM/100ML-% IV SOLN
INTRAVENOUS | Status: AC
  Filled 2021-02-27: qty 100

## 2021-02-27 MED ORDER — ROCURONIUM BROMIDE 100 MG/10ML IV SOLN
INTRAVENOUS | Status: DC | PRN
  Administered 2021-02-27: 20 mg via INTRAVENOUS
  Administered 2021-02-27: 40 mg via INTRAVENOUS
  Administered 2021-02-27: 60 mg via INTRAVENOUS
  Administered 2021-02-27: 20 mg via INTRAVENOUS

## 2021-02-27 MED ORDER — FENTANYL CITRATE (PF) 100 MCG/2ML IJ SOLN: 25.0000 ug | INTRAMUSCULAR | Status: DC | PRN

## 2021-02-27 MED ORDER — MIDAZOLAM HCL 2 MG/2ML IJ SOLN
INTRAMUSCULAR | Status: DC | PRN
  Administered 2021-02-27: 2 mg via INTRAVENOUS

## 2021-02-27 MED ORDER — LIDOCAINE HCL (CARDIAC) PF 100 MG/5ML IV SOSY
PREFILLED_SYRINGE | INTRAVENOUS | Status: DC | PRN
  Administered 2021-02-27: 100 mg via INTRAVENOUS

## 2021-02-27 MED ORDER — BUPIVACAINE-EPINEPHRINE 0.25% -1:200000 IJ SOLN
INTRAMUSCULAR | Status: DC | PRN
  Administered 2021-02-27: 30 mL

## 2021-02-27 MED ORDER — VISTASEAL 10 ML SINGLE DOSE KIT
PACK | CUTANEOUS | Status: DC | PRN
  Administered 2021-02-27: 10 mL via TOPICAL

## 2021-02-27 SURGICAL SUPPLY — 61 items
APPLICATOR VISTASEAL 35 (MISCELLANEOUS) ×3 IMPLANT
BAG INFUSER PRESSURE 100CC (MISCELLANEOUS) IMPLANT
BLADE SURG SZ11 CARB STEEL (BLADE) ×3 IMPLANT
CHLORAPREP W/TINT 26 (MISCELLANEOUS) ×3 IMPLANT
COVER TIP SHEARS 8 DVNC (MISCELLANEOUS) ×2 IMPLANT
COVER TIP SHEARS 8MM DA VINCI (MISCELLANEOUS) ×3
COVER WAND RF STERILE (DRAPES) ×3 IMPLANT
DEFOGGER SCOPE WARMER CLEARIFY (MISCELLANEOUS) ×3 IMPLANT
DERMABOND ADVANCED (GAUZE/BANDAGES/DRESSINGS) ×1
DERMABOND ADVANCED .7 DNX12 (GAUZE/BANDAGES/DRESSINGS) ×2 IMPLANT
DRAPE ARM DVNC X/XI (DISPOSABLE) ×6 IMPLANT
DRAPE COLUMN DVNC XI (DISPOSABLE) ×2 IMPLANT
DRAPE DA VINCI XI ARM (DISPOSABLE) ×9
DRAPE DA VINCI XI COLUMN (DISPOSABLE) ×3
ELECT CAUTERY BLADE 6.4 (BLADE) ×3 IMPLANT
ELECT REM PT RETURN 9FT ADLT (ELECTROSURGICAL) ×3
ELECTRODE REM PT RTRN 9FT ADLT (ELECTROSURGICAL) ×2 IMPLANT
GAUZE 4X4 16PLY ~~LOC~~+RFID DBL (SPONGE) ×6 IMPLANT
GLOVE SURG ENC MOIS LTX SZ6.5 (GLOVE) ×15 IMPLANT
GLOVE SURG UNDER POLY LF SZ6.5 (GLOVE) ×15 IMPLANT
GOWN STRL REUS W/ TWL LRG LVL3 (GOWN DISPOSABLE) ×10 IMPLANT
GOWN STRL REUS W/TWL LRG LVL3 (GOWN DISPOSABLE) ×15
GRASPER SUT TROCAR 14GX15 (MISCELLANEOUS) ×3 IMPLANT
HEMOSTAT SURGICEL 2X14 (HEMOSTASIS) ×3 IMPLANT
IRRIGATOR SUCT 8 DISP DVNC XI (IRRIGATION / IRRIGATOR) IMPLANT
IRRIGATOR SUCTION 8MM XI DISP (IRRIGATION / IRRIGATOR)
IV CATH ANGIO 12GX3 LT BLUE (NEEDLE) ×3 IMPLANT
IV NS 1000ML (IV SOLUTION)
IV NS 1000ML BAXH (IV SOLUTION) IMPLANT
KIT PINK PAD W/HEAD ARE REST (MISCELLANEOUS) ×3
KIT PINK PAD W/HEAD ARM REST (MISCELLANEOUS) ×2 IMPLANT
LABEL OR SOLS (LABEL) IMPLANT
MANIFOLD NEPTUNE II (INSTRUMENTS) ×3 IMPLANT
MESH 3DMAX 4X6 LT LRG (Mesh General) IMPLANT
MESH 3DMAX 4X6 RT LRG (Mesh General) IMPLANT
MESH 3DMAX 5X7 LT XLRG (Mesh General) ×1 IMPLANT
MESH 3DMAX 5X7 RT XLRG (Mesh General) ×1 IMPLANT
MESH 3DMAX MID 5X7 LT XLRG (Mesh General) ×2 IMPLANT
MESH 3DMAX MID 5X7 RT XLRG (Mesh General) ×2 IMPLANT
NEEDLE HYPO 22GX1.5 SAFETY (NEEDLE) ×3 IMPLANT
NEEDLE INSUFFLATION 14GA 120MM (NEEDLE) ×3 IMPLANT
OBTURATOR OPTICAL STANDARD 8MM (TROCAR) ×3
OBTURATOR OPTICAL STND 8 DVNC (TROCAR) ×2
OBTURATOR OPTICALSTD 8 DVNC (TROCAR) ×2 IMPLANT
PACK LAP CHOLECYSTECTOMY (MISCELLANEOUS) ×3 IMPLANT
PENCIL ELECTRO HAND CTR (MISCELLANEOUS) ×3 IMPLANT
SEAL CANN UNIV 5-8 DVNC XI (MISCELLANEOUS) ×6 IMPLANT
SEAL XI 5MM-8MM UNIVERSAL (MISCELLANEOUS) ×9
SET TUBE SMOKE EVAC HIGH FLOW (TUBING) ×3 IMPLANT
SOLUTION ELECTROLUBE (MISCELLANEOUS) ×3 IMPLANT
SPONGE T-LAP 4X18 ~~LOC~~+RFID (SPONGE) ×3 IMPLANT
SUT MNCRL 4-0 (SUTURE) ×3
SUT MNCRL 4-0 27XMFL (SUTURE) ×2
SUT VIC AB 0 SH 27 (SUTURE) ×3 IMPLANT
SUT VIC AB 2-0 SH 27 (SUTURE) ×6
SUT VIC AB 2-0 SH 27XBRD (SUTURE) ×4 IMPLANT
SUT VLOC 90 S/L VL9 GS22 (SUTURE) ×6 IMPLANT
SUTURE MNCRL 4-0 27XMF (SUTURE) ×2 IMPLANT
TAPE TRANSPORE STRL 2 31045 (GAUZE/BANDAGES/DRESSINGS) IMPLANT
TRAY FOLEY MTR SLVR 16FR STAT (SET/KITS/TRAYS/PACK) ×3 IMPLANT
WATER STERILE IRR 500ML POUR (IV SOLUTION) IMPLANT

## 2021-02-27 NOTE — Interval H&P Note (Signed)
History and Physical Interval Note:  02/27/2021 7:05 AM  Anthony Fuller  has presented today for surgery, with the diagnosis of K40.90 non recurrent unilateral vs bilateral inguinal hernia w/o obstruction or grangrene.  The various methods of treatment have been discussed with the patient and family. After consideration of risks, benefits and other options for treatment, the patient has consented to  Procedure(s): XI ROBOTIC ASSISTED RIGHT VS [place] as a surgical intervention.  The patient's history has been reviewed, patient examined, no change in status, stable for surgery.  I have reviewed the patient's chart and labs.  Questions were answered to the patient's satisfaction.     Herbert Pun

## 2021-02-27 NOTE — Transfer of Care (Signed)
Immediate Anesthesia Transfer of Care Note  Patient: Anthony Fuller  Procedure(s) Performed: XI ROBOTIC ASSISTED BILATERAL INGUINAL HERNIA (Bilateral: Groin) INSERTION OF MESH  Patient Location: PACU  Anesthesia Type:General  Level of Consciousness: awake, drowsy and patient cooperative  Airway & Oxygen Therapy: Patient Spontanous Breathing and Patient connected to face mask oxygen  Post-op Assessment: Report given to RN and Post -op Vital signs reviewed and stable  Post vital signs: Reviewed and stable  Last Vitals:  Vitals Value Taken Time  BP 152/77 02/27/21 1031  Temp 36.9 C 02/27/21 1031  Pulse 80 02/27/21 1032  Resp 15 02/27/21 1032  SpO2 99 % 02/27/21 1032  Vitals shown include unvalidated device data.  Last Pain:  Vitals:   02/27/21 1031  TempSrc:   PainSc: Asleep         Complications: No notable events documented.

## 2021-02-27 NOTE — Discharge Instructions (Addendum)
  Diet: Resume home heart healthy regular diet.   Activity: No heavy lifting >20 pounds (children, pets, laundry, garbage) or strenuous activity until follow-up, but light activity and walking are encouraged. Do not drive or drink alcohol if taking narcotic pain medications.  Wound care: May shower with soapy water and pat dry (do not rub incisions), but no baths or submerging incision underwater until follow-up. (no swimming)   Medications: Resume all home medications except . For mild to moderate pain: acetaminophen (Tylenol) or ibuprofen (if no kidney disease). Combining Tylenol with alcohol can substantially increase your risk of causing liver disease. Narcotic pain medications, if prescribed, can be used for severe pain, though may cause nausea, constipation, and drowsiness. If you do not need the narcotic pain medication, you do not need to fill the prescription.  Call office [phone]) at any time if any questions, worsening pain, fevers/chills, bleeding, drainage from incision site, or other concerns. AMBULATORY SURGERY  DISCHARGE INSTRUCTIONS   The drugs that you were given will stay in your system until tomorrow so for the next 24 hours you should not:  Drive an automobile Make any legal decisions Drink any alcoholic beverage   You may resume regular meals tomorrow.  Today it is better to start with liquids and gradually work up to solid foods.  You may eat anything you prefer, but it is better to start with liquids, then soup and crackers, and gradually work up to solid foods.   Please notify your doctor immediately if you have any unusual bleeding, trouble breathing, redness and pain at the surgery site, drainage, fever, or pain not relieved by medication.    Additional Instructions:        Please contact your physician with any problems or Same Day Surgery at [phone], Monday through Friday 6 am to 4 pm, or [place] at [facility] number at [phone].

## 2021-02-27 NOTE — Anesthesia Procedure Notes (Signed)
Procedure Name: Intubation Date/Time: 02/27/2021 7:41 AM Performed by: Kelton Pillar, CRNA Pre-anesthesia Checklist: Patient identified, Emergency Drugs available, Suction available and Patient being monitored Patient Re-evaluated:Patient Re-evaluated prior to induction Oxygen Delivery Method: Circle system utilized Preoxygenation: Pre-oxygenation with 100% oxygen Induction Type: IV induction Ventilation: Mask ventilation without difficulty Laryngoscope Size: McGraph and 4 Grade View: Grade II Tube type: Oral Tube size: 7.0 mm Number of attempts: 1 Airway Equipment and Method: Stylet and Oral airway Placement Confirmation: ETT inserted through vocal cords under direct vision, positive ETCO2 and breath sounds checked- equal and bilateral Secured at: 24 cm Tube secured with: Tape Dental Injury: Teeth and Oropharynx as per pre-operative assessment

## 2021-02-27 NOTE — Op Note (Signed)
Preoperative diagnosis: Bilateral inguinal hernia.   Postoperative diagnosis: Bilateral inguinal hernia.  Procedure: Robotic assisted Laparoscopic Transabdominal preperitoneal laparoscopic (TAPP) repair of bilateral inguinal hernia.  Anesthesia: GETA  Surgeon: Dr. Windell Moment  Wound Classification: Clean  Indications:  Patient is a 60 y.o. male developed a symptomatic bilateral inguinal hernia. Repair was indicated.  Findings: Right indirect Inguinal hernia identified   2.  Left indirect inguinal hernia identified.    3.  Vas deferens and cord structures identified and preserved 4. Bard XL midweight mesh used for repair on both sides 5. Adequate hemostasis.   Description of procedure: The patient was taken to the operating room and the correct side of surgery was verified. The patient was placed supine with arms tucked at the sides. After obtaining adequate anesthesia, the patient's abdomen was prepped and draped in standard sterile fashion. The patient was placed in the Trendelenburg position. A time-out was completed verifying correct patient, procedure, site, positioning, and implant(s) and/or special equipment prior to beginning this procedure. A Veress needle was placed at the umbilicus and pneumoperitoneum created with insufflation of carbon dioxide to 15 mmHg. After the Veress needle was removed, an 8-mm trocar was placed on epigastric area and the 30 angled laparoscope inserted. Two 8-mm trocars were then placed lateral to the rectus sheath under direct visualization. Both inguinal regions were inspected and the median umbilical ligament, medial umbilical ligament, and lateral umbilical fold were identified.  The robotic arms were docked. The robotic scope was inserted and the pelvic area anatomy targeted.  The right inguinal hernia was done first since it was more symptomatic. The peritoneum was incised with scissors along a line 5 cm above the superior edge of the hernia defect,  extending from the median umbilical ligament to the anterior superior iliac spine. The peritoneal flap was mobilized inferiorly using blunt and sharp dissection. The inferior epigastric vessels were exposed and the pubic symphysis was identified. Cooper's ligament was dissected to its junction with the iliac vein. The dissection was continued inferiorly to the iliopubic tract, with care taken to avoid injury to the femoral branch of the genitofemoral nerve and the lateral femoral cutaneous nerve. The cord structures were parietalized. The hernia was identified and reduced by gentle traction.  The indirect hernia sac was noted mobilized from the cord structures and reduced into the peritoneal cavity.  A large piece of mesh was rolled longitudinally into a compact cylinder and passed through a trocar. The cylinder was placed along the inferior aspect of the working space and unrolled into place to completely cover the direct, indirect, and femoral spaces. The mesh was secured into place superiorly to the anterior abdominal wall and inferiorly and medially to Cooper's ligament with absorbable sutures. Care was taken to avoid the inferolateral triangles containing the iliac vessels and genital nerves. The peritoneal flap was closed over the mesh and secured with suture in similar positions of safety.  The left inguinal hernia was repaired using the same technique.  After ensuring adequate hemostasis, the trocars were removed and the pneumoperitoneum allowed to escape. The trocar incisions were closed using monocryl and skin adhesive dressings applied.  The patient tolerated the procedure well and was taken to the postanesthesia care unit in stable condition.   Specimen: None  Complications: None  Estimated Blood Loss: 10 mL

## 2021-02-27 NOTE — Anesthesia Preprocedure Evaluation (Signed)
Anesthesia Evaluation  Patient identified by MRN, date of birth, ID band Patient awake    Reviewed: Allergy & Precautions, H&P , NPO status , Patient's Chart, lab work & pertinent test results, reviewed documented beta blocker date and time   Airway Mallampati: III  TM Distance: <3 FB Neck ROM: full    Dental  (+) Teeth Intact, Poor Dentition   Pulmonary sleep apnea and Continuous Positive Airway Pressure Ventilation ,    Pulmonary exam normal        Cardiovascular Exercise Tolerance: Good hypertension, On Medications negative cardio ROS Normal cardiovascular exam Rhythm:regular Rate:Normal     Neuro/Psych  Headaches, Anxiety  Neuromuscular disease negative psych ROS   GI/Hepatic Neg liver ROS, GERD  Medicated,  Endo/Other  diabetes, Well Controlled, Insulin DependentMorbid obesity  Renal/GU Renal disease  negative genitourinary   Musculoskeletal  (+) Arthritis ,   Abdominal   Peds  Hematology negative hematology ROS (+)   Anesthesia Other Findings Past Medical History: 02/14/2012: Acid indigestion No date: Anxiety No date: Arthritis     Comment:  BIL.KNEES 09/05/2011: Bulge of lumbar disc without myelopathy 2012: Cancer ([place])     Comment:  R kidney removed No date: Cervical stenosis of spine No date: Cervicalgia No date: Chest wall pain 02/04/2014: Clostridium difficile infection No date: DDD (degenerative disc disease), lumbar No date: DDD (degenerative disc disease), lumbar 08/23/2011: Degeneration of intervertebral disc of lumbar region 07/24/2011: Diabetes mellitus ([place]) No date: Diabetes mellitus without complication (HCC) No date: Diabetic polyneuropathy associated with type 2 diabetes  mellitus (HCC) No date: Diarrhea 05/30/2015: DM (diabetes mellitus) type 2, uncontrolled, with  ketoacidosis ([place]) No date: Dyspepsia 04/11/2015: Enthesopathy of hip 09/12/2014: Fatty liver disease, nonalcoholic No  date: GERD (gastroesophageal reflux disease) No date: Headache 09/12/2014: Hepatic cirrhosis (HCC) No date: Hydrocele No date: Hydrocele No date: Hypertension No date: IBS (irritable bowel syndrome) No date: Lumbar herniated disc 09/05/2011: Lumbar radiculopathy No date: Lumbar stenosis No date: Peroneal tendon injury, right, initial encounter No date: Peroneal tendon tear No date: Radiculopathy No date: Renal insufficiency     Comment:  Right Nephrectomy due to [place]. No date: Sleep apnea     Comment:  snores No date: Stenosis of cervical spine 07/24/2011: Thoracic and lumbosacral neuritis No date: Thrombocytopathia ([place]) No date: Thrombocytopenia ([place]) Past Surgical History: No date: BACK SURGERY     Comment:  cervical fusion C5-7 06/20/2015: CARDIAC CATHETERIZATION; Right     Comment:  Procedure: Left Heart Cath and Coronary Angiography;                Surgeon: Dionisio David, MD;  Location: [facility]               [facility];  Service: Cardiovascular;  Laterality: Right; No date: COLONOSCOPY 05/18/2015: CYSTOSCOPY W/ RETROGRADES; Left     Comment:  Procedure: CYSTOSCOPY WITH RETROGRADE PYELOGRAM;                Surgeon: Nickie Retort, MD;  Location: [facility];                Service: Urology;  Laterality: Left; 05/18/2015: [place]; Left     Comment:  Procedure: CYSTOSCOPY WITH STENT PLACEMENT;  Surgeon:               Nickie Retort, MD;  Location: [facility];  Service:               Urology;  Laterality: Left; 06/28/2015:  CYSTOSCOPY WITH STENT PLACEMENT; Left     Comment:  Procedure: CYSTOSCOPY WITH STENT PLACEMENT/ EXCHANGE;                Surgeon: Hollice Espy, MD;  Location: [facility];                Service: Urology;  Laterality: Left; 09/03/2016: ESOPHAGOGASTRODUODENOSCOPY (EGD) WITH PROPOFOL; N/A     Comment:  Procedure: ESOPHAGOGASTRODUODENOSCOPY (EGD) WITH               PROPOFOL;  Surgeon: Manya Silvas, MD;  Location: [facility]               ENDOSCOPY;  Service: Endoscopy;  Laterality: N/A; 03/14/2018: ESOPHAGOGASTRODUODENOSCOPY ENDOSCOPY; N/A     Comment:  Procedure: ESOPHAGOGASTRODUODENOSCOPY ENDOSCOPY;                Surgeon: Manya Silvas, MD;  Location: [facility]               ENDOSCOPY;  Service: Endoscopy;  Laterality: N/A; 08/23/2017: HALLUX VALGUS BASE WEDGE; Right     Comment:  Procedure: [place];  Surgeon: Samara Deist, DPM;  Location: [facility];  Service: Podiatry;                Laterality: Right; No date: JOINT REPLACEMENT     Comment:  right knee  No date: kidney removed; Right     Comment:  2012 No date: NAFLD 04/2011: NEPHRECTOMY; Right 08/23/2017: ORIF TOE FRACTURE; Right     Comment:  Procedure: OPEN REDUCTION INTERNAL FIXATION (ORIF)               METATARSAL (TOE) FRACTURE;  Surgeon: Samara Deist, DPM;              Location: [facility];  Service: Podiatry;  Laterality:               Right; No date: SPINE SURGERY     Comment:  lumbar 2013 05/18/2015: URETEROSCOPY WITH HOLMIUM LASER LITHOTRIPSY; Left     Comment:  Procedure: URETEROSCOPY WITH HOLMIUM LASER LITHOTRIPSY;               Surgeon: Nickie Retort, MD;  Location: [facility];                Service: Urology;  Laterality: Left; 06/28/2015: URETEROSCOPY WITH HOLMIUM LASER LITHOTRIPSY; Left     Comment:  Procedure: URETEROSCOPY WITH HOLMIUM LASER LITHOTRIPSY;               Surgeon: Hollice Espy, MD;  Location: [facility];                Service: Urology;  Laterality: Left; BMI    Body Mass Index: 39.16 kg/m     Reproductive/Obstetrics negative OB ROS                             Anesthesia Physical Anesthesia Plan  ASA: 4  Anesthesia Plan: General ETT   Post-op Pain Management:    Induction:   PONV Risk Score and Plan: 3  Airway Management Planned: Video Laryngoscope Planned  Additional Equipment:   Intra-op Plan:   Post-operative Plan:   Informed Consent: I have reviewed  the patients History and Physical, chart, labs and discussed the procedure including the risks, benefits and alternatives for the proposed anesthesia  with the patient or authorized representative who has indicated his/her understanding and acceptance.     Dental Advisory Given  Plan Discussed with: CRNA  Anesthesia Plan Comments:         Anesthesia Quick Evaluation

## 2021-02-28 NOTE — Anesthesia Postprocedure Evaluation (Signed)
Anesthesia Post Note  Patient: Anthony Fuller  Procedure(s) Performed: XI ROBOTIC ASSISTED BILATERAL INGUINAL HERNIA (Bilateral: Groin) INSERTION OF MESH  Patient location during evaluation: PACU Anesthesia Type: General Level of consciousness: awake and alert Pain management: pain level controlled Vital Signs Assessment: post-procedure vital signs reviewed and stable Respiratory status: spontaneous breathing, nonlabored ventilation, respiratory function stable and patient connected to nasal cannula oxygen Cardiovascular status: blood pressure returned to baseline and stable Postop Assessment: no apparent nausea or vomiting Anesthetic complications: no   No notable events documented.   Last Vitals:  Vitals:   02/27/21 1230 02/27/21 1305  BP: 133/78 (!) 153/85  Pulse: 77 78  Resp: 12 14  Temp: (!) 36.4 C (!) 36.3 C  SpO2: 93% 95%    Last Pain:  Vitals:   02/27/21 1305  TempSrc: Temporal  PainSc: 3                  Molli Barrows

## 2021-03-04 ENCOUNTER — Encounter (INDEPENDENT_AMBULATORY_CARE_PROVIDER_SITE_OTHER): Payer: Self-pay | Admitting: Nurse Practitioner

## 2021-03-04 NOTE — Progress Notes (Signed)
Subjective:    Patient ID: Anthony Fuller, male    DOB: 1960/11/18, 60 y.o.   MRN: [id number] Chief Complaint  Patient presents with   New Patient (Initial Visit)    Ref Anthony Fuller varicose veins with swelling    Anthony Fuller is a 60 year old male that returns to the office for followup evaluation regarding leg swelling.  The swelling has persisted and the pain associated with swelling continues. There have not been any interval development of a ulcerations or wounds.  Since the previous visit the patient has been wearing graduated compression stockings and has noted little if any improvement in the lymphedema. The patient has been using compression routinely morning until night.  The patient also states elevation during the day and exercise is being done too.    He notes that he has neuropathy in his feet but his feet turn purple for her and they are painful and cold.  He also turn white at times.  He does have known back issues and surgeries.  He also notes that his feet and legs feel tired when walking.  He denies any rest pain like symptoms.  He has tender veins as well.  Today noninvasive studies show no evidence of DVT or superficial thrombophlebitis bilaterally.  There is evidence of right superficial venous reflux in his great saphenous vein at the distal thigh.  There is also reflux in the left lower extremity in the great saphenous vein at the mid and distal thigh.  There is no deep venous insufficiency bilaterally   Review of Systems  Cardiovascular:  Positive for leg swelling.  Musculoskeletal:  Positive for back pain.  Neurological:  Positive for weakness.  All other systems reviewed and are negative.     Objective:   Physical Exam Vitals reviewed.  HENT:     Head: Normocephalic.  Cardiovascular:     Rate and Rhythm: Normal rate.     Pulses: Decreased pulses.  Pulmonary:     Effort: Pulmonary effort is normal.  Skin:    General: Skin is warm and dry.  Neurological:      Mental Status: He is alert and oriented to person, place, and time.  Psychiatric:        Mood and Affect: Mood normal.        Behavior: Behavior normal.        Thought Content: Thought content normal.        Judgment: Judgment normal.    BP (!) 171/110 (BP Location: Right Arm)   Pulse 78   Resp 16   Ht 6\' 2"  (1.88 m)   Wt (!) 301 lb (136.5 kg)   BMI 38.65 kg/m   Past Medical History:  Diagnosis Date   Acid indigestion 02/14/2012   Anxiety    Arthritis    BIL.KNEES   Bulge of lumbar disc without myelopathy 09/05/2011   Cancer ([place]) 2012   R kidney removed   Cervical stenosis of spine    Cervicalgia    Chest wall pain    Clostridium difficile infection 02/04/2014   DDD (degenerative disc disease), lumbar    DDD (degenerative disc disease), lumbar    Degeneration of intervertebral disc of lumbar region 08/23/2011   Diabetes mellitus ([place]) 07/24/2011   Diabetes mellitus without complication ([place])    Diabetic polyneuropathy associated with type 2 diabetes mellitus ([place])    Diarrhea    DM (diabetes mellitus) type 2, uncontrolled, with ketoacidosis ([place]) 05/30/2015   Dyspepsia    Enthesopathy  of hip 04/11/2015   Fatty liver disease, nonalcoholic 09/16/8125   GERD (gastroesophageal reflux disease)    Headache    Hepatic cirrhosis (HCC) 09/12/2014   Hydrocele    Hydrocele    Hypertension    IBS (irritable bowel syndrome)    Lumbar herniated disc    Lumbar radiculopathy 09/05/2011   Lumbar stenosis    Peroneal tendon injury, right, initial encounter    Peroneal tendon tear    Radiculopathy    Renal insufficiency    Right Nephrectomy due to [place].   Sleep apnea    snores   Stenosis of cervical spine    Thoracic and lumbosacral neuritis 07/24/2011   Thrombocytopathia (HCC)    Thrombocytopenia (HCC)     Social History   Socioeconomic History   Marital status: Married    Spouse name: Not on file   Number of children: Not on file   Years of education: Not on file    Highest education level: Not on file  Occupational History   Not on file  Tobacco Use   Smoking status: Never   Smokeless tobacco: Never  Vaping Use   Vaping Use: Never used  Substance and Sexual Activity   Alcohol use: No   Drug use: No   Sexual activity: Yes  Other Topics Concern   Not on file  Social History Narrative   Not on file   Social Determinants of Health   Financial Resource Strain: Not on file  Food Insecurity: Not on file  Transportation Needs: Not on file  Physical Activity: Not on file  Stress: Not on file  Social Connections: Not on file  Intimate Partner Violence: Not on file    Past Surgical History:  Procedure Laterality Date   BACK SURGERY     cervical fusion C5-7   CARDIAC CATHETERIZATION Right 06/20/2015   Procedure: Left Heart Cath and Coronary Angiography;  Surgeon: Anthony David, MD;  Location: [facility];  Service: Cardiovascular;  Laterality: Right;   COLONOSCOPY     CYSTOSCOPY W/ RETROGRADES Left 05/18/2015   Procedure: CYSTOSCOPY WITH RETROGRADE PYELOGRAM;  Surgeon: Anthony Retort, MD;  Location: [facility];  Service: Urology;  Laterality: Left;   CYSTOSCOPY WITH STENT PLACEMENT Left 05/18/2015   Procedure: CYSTOSCOPY WITH STENT PLACEMENT;  Surgeon: Anthony Retort, MD;  Location: [facility];  Service: Urology;  Laterality: Left;   CYSTOSCOPY WITH STENT PLACEMENT Left 06/28/2015   Procedure: CYSTOSCOPY WITH STENT PLACEMENT/ EXCHANGE;  Surgeon: Anthony Espy, MD;  Location: [facility];  Service: Urology;  Laterality: Left;   ESOPHAGOGASTRODUODENOSCOPY (EGD) WITH PROPOFOL N/A 09/03/2016   Procedure: ESOPHAGOGASTRODUODENOSCOPY (EGD) WITH PROPOFOL;  Surgeon: Anthony Silvas, MD;  Location: [facility];  Service: Endoscopy;  Laterality: N/A;   ESOPHAGOGASTRODUODENOSCOPY ENDOSCOPY N/A 03/14/2018   Procedure: ESOPHAGOGASTRODUODENOSCOPY ENDOSCOPY;  Surgeon: Anthony Silvas, MD;  Location[facility];  Service: Endoscopy;  Laterality:  N/A;   HALLUX VALGUS BASE WEDGE Right 08/23/2017   Procedure: HALLUX VALGUS BASE WEDGE;  Surgeon: Anthony Fuller, DPM;  Location: [facility];  Service: Podiatry;  Laterality: Right;   INSERTION OF MESH  02/27/2021   Procedure: INSERTION OF MESH;  Surgeon: Herbert Pun, MD;  Location: [facility];  Service: General;;   JOINT REPLACEMENT     right knee    kidney removed Right    2012   NAFLD     NEPHRECTOMY Right 04/2011   ORIF TOE FRACTURE Right 08/23/2017   Procedure: OPEN REDUCTION INTERNAL FIXATION (ORIF)  METATARSAL (TOE) FRACTURE;  Surgeon: Anthony Fuller, DPM;  Location: [facility];  Service: Podiatry;  Laterality: Right;   SPINE SURGERY     lumbar 2013   URETEROSCOPY WITH HOLMIUM LASER LITHOTRIPSY Left 05/18/2015   Procedure: URETEROSCOPY WITH HOLMIUM LASER LITHOTRIPSY;  Surgeon: Anthony Retort, MD;  Location: [facility];  Service: Urology;  Laterality: Left;   URETEROSCOPY WITH HOLMIUM LASER LITHOTRIPSY Left 06/28/2015   Procedure: URETEROSCOPY WITH HOLMIUM LASER LITHOTRIPSY;  Surgeon: Anthony Espy, MD;  Location: [facility];  Service: Urology;  Laterality: Left;    Family History  Problem Relation Age of Onset   Diabetes Mother    Hypertension Mother    Asthma Mother    Heart disease Father    Kidney disease Father    Diabetes Father    Stroke Father     Allergies  Allergen Reactions   Hydromorphone Other (See Comments)    Severe hypotension    Aspirin     Hx GI bleed   Nsaids     Hx of GI bleed   Floxin [Ofloxacin] Rash    CBC Latest Ref Rng & Units 02/23/2021 06/24/2015 06/18/2015  WBC 4.0 - 10.5 K/uL 6.6 10.9(H) 12.5(H)  Hemoglobin 13.0 - 17.0 g/dL 16.5 15.6 12.0(L)  Hematocrit 39.0 - 52.0 % 49.6 47.0 35.8(L)  Platelets 150 - 400 K/uL 177 239 113(L)      CMP     Component Value Date/Time   NA 138 06/24/2015 2310   NA 141 06/03/2014 0458   K 4.6 06/24/2015 2310   K 3.9 06/03/2014 0458   CL 102 06/24/2015 2310   CL 108 (H) 06/03/2014 0458   CO2 26  06/24/2015 2310   CO2 26 06/03/2014 0458   GLUCOSE 241 (H) 06/24/2015 2310   GLUCOSE 101 (H) 06/03/2014 0458   BUN 27 (H) 06/24/2015 2310   BUN 17 06/03/2014 0458   CREATININE 1.38 (H) 06/24/2015 2310   CREATININE 1.19 06/03/2014 0458   CALCIUM 9.8 06/24/2015 2310   CALCIUM 8.4 (L) 06/03/2014 0458   PROT 7.4 05/31/2014 0637   ALBUMIN 3.5 05/31/2014 0637   AST 32 05/31/2014 0637   ALT 40 05/31/2014 0637   ALKPHOS 149 (H) 05/31/2014 0637   BILITOT 0.4 05/31/2014 0637   GFRNONAA 57 (L) 06/24/2015 2310   GFRNONAA >60 06/03/2014 0458   GFRNONAA >60 11/27/2013 0443   GFRAA >60 06/24/2015 2310   GFRAA >60 06/03/2014 0458   GFRAA >60 11/27/2013 0443     No results found.     Assessment & Plan:   1. Varicose veins of both legs with edema Recommend:  I have reviewed my discussion with the patient regarding  varicose veins and why they cause symptoms.  Patient will continue wearing graduated compression stockings class 1 on a daily basis, beginning first thing in the morning and removing them in the evening. The patient is instructed specifically not to sleep in the stockings.  The patient  will also continue using over-the-counter analgesics such as Motrin 600 mg po TID to help control the symptoms.  In addition, behavioral modification including elevation during the day was reiterated.    The patient demonstrates extensive reflux in the GSV.  Conservative therapy will continue for now.  The importance of compression, elevation and exercise were stressed.   2. Decreased pulses in feet  Recommend:  The patient has atypical pain symptoms for pure atherosclerotic disease. However, on physical exam there is evidence of mixed venous and arterial disease, given  the diminished pulses and the edema associated with venous changes of the legs.  Noninvasive studies including ABI's of the legs will be obtained and the patient will follow up with me to review these studies.  I suspect the  patient is c/o pseudoclaudication.  Patient should have an evaluation of his LS spine which I defer to the primary service.  The patient should continue walking and begin a more formal exercise program. The patient should continue his antiplatelet therapy and aggressive treatment of the lipid abnormalities.  The patient should begin wearing graduated compression socks 15-20 mmHg strength to control edema.   - VAS [place] ABI WITH/WO TBI; Future   Current Outpatient Medications on File Prior to Visit  Medication Sig Dispense Refill   cetirizine (ZYRTEC) 10 MG tablet Take 10 mg by mouth daily as needed for allergies.     gabapentin (NEURONTIN) 400 MG capsule Take 400 mg by mouth 3 (three) times daily.     insulin NPH-regular Human (70-30) 100 UNIT/ML injection Inject 55-60 Units into the skin See admin instructions. Inject 60 units in the morning and 55 units at night     lidocaine (XYLOCAINE) 5 % ointment Apply 1 application topically 2 (two) times daily.     metFORMIN (GLUCOPHAGE-XR) 500 MG 24 hr tablet Take 500-1,000 mg by mouth See admin instructions. Take 1000 mg in the morning and 500 mg in the evening     Multiple Vitamin (MULTIVITAMIN WITH MINERALS) TABS tablet Take 1 tablet by mouth daily.     omeprazole (PRILOSEC) 40 MG capsule Take 40 mg by mouth daily.     tiZANidine (ZANAFLEX) 4 MG capsule Take 4 mg by mouth 3 (three) times daily.     traMADol (ULTRAM) 50 MG tablet Take 50 mg by mouth every 6 (six) hours as needed for moderate pain. for pain  1   traMADol (ULTRAM) 50 MG tablet Take 1 tablet (50 mg total) by mouth every 6 (six) hours as needed. 20 tablet 0   No current facility-administered medications on file prior to visit.    There are no Patient Instructions on file for this visit. No follow-ups on file.   Kris Hartmann, NP

## 2021-03-06 ENCOUNTER — Encounter: Payer: Self-pay | Admitting: General Surgery

## 2021-03-06 MED ORDER — HEMOSTATIC AGENTS (NO CHARGE) OPTIME
TOPICAL | Status: DC | PRN
  Administered 2021-02-27: 1 via TOPICAL

## 2021-04-03 ENCOUNTER — Other Ambulatory Visit: Payer: Self-pay | Admitting: Neurology

## 2021-04-03 DIAGNOSIS — R29818 Other symptoms and signs involving the nervous system: Secondary | ICD-10-CM

## 2021-04-05 ENCOUNTER — Emergency Department: Payer: Medicare HMO

## 2021-04-05 ENCOUNTER — Other Ambulatory Visit: Payer: Self-pay

## 2021-04-05 ENCOUNTER — Emergency Department
Admission: EM | Admit: 2021-04-05 | Discharge: 2021-04-06 | Disposition: A | Discharge status: Home / Self Care | Payer: Medicare HMO | Attending: Emergency Medicine | Admitting: Emergency Medicine

## 2021-04-05 DIAGNOSIS — R079 Chest pain, unspecified: Secondary | ICD-10-CM | POA: Insufficient documentation

## 2021-04-05 DIAGNOSIS — R519 Headache, unspecified: Secondary | ICD-10-CM | POA: Insufficient documentation

## 2021-04-05 DIAGNOSIS — E1122 Type 2 diabetes mellitus with diabetic chronic kidney disease: Secondary | ICD-10-CM | POA: Diagnosis not present

## 2021-04-05 DIAGNOSIS — Z85528 Personal history of other malignant neoplasm of kidney: Secondary | ICD-10-CM | POA: Diagnosis not present

## 2021-04-05 DIAGNOSIS — Z96651 Presence of right artificial knee joint: Secondary | ICD-10-CM | POA: Insufficient documentation

## 2021-04-05 DIAGNOSIS — Z20822 Contact with and (suspected) exposure to covid-19: Secondary | ICD-10-CM | POA: Insufficient documentation

## 2021-04-05 DIAGNOSIS — R0789 Other chest pain: Secondary | ICD-10-CM | POA: Diagnosis not present

## 2021-04-05 DIAGNOSIS — Z794 Long term (current) use of insulin: Secondary | ICD-10-CM | POA: Diagnosis not present

## 2021-04-05 DIAGNOSIS — R5383 Other fatigue: Secondary | ICD-10-CM | POA: Insufficient documentation

## 2021-04-05 DIAGNOSIS — I129 Hypertensive chronic kidney disease with stage 1 through stage 4 chronic kidney disease, or unspecified chronic kidney disease: Secondary | ICD-10-CM | POA: Diagnosis not present

## 2021-04-05 DIAGNOSIS — Z79899 Other long term (current) drug therapy: Secondary | ICD-10-CM | POA: Diagnosis not present

## 2021-04-05 DIAGNOSIS — E1142 Type 2 diabetes mellitus with diabetic polyneuropathy: Secondary | ICD-10-CM | POA: Insufficient documentation

## 2021-04-05 DIAGNOSIS — R0602 Shortness of breath: Secondary | ICD-10-CM | POA: Diagnosis not present

## 2021-04-05 DIAGNOSIS — R2243 Localized swelling, mass and lump, lower limb, bilateral: Secondary | ICD-10-CM | POA: Diagnosis not present

## 2021-04-05 DIAGNOSIS — I10 Essential (primary) hypertension: Secondary | ICD-10-CM

## 2021-04-05 DIAGNOSIS — N183 Chronic kidney disease, stage 3 unspecified: Secondary | ICD-10-CM | POA: Diagnosis not present

## 2021-04-05 DIAGNOSIS — Z7984 Long term (current) use of oral hypoglycemic drugs: Secondary | ICD-10-CM | POA: Insufficient documentation

## 2021-04-05 LAB — HEPATIC FUNCTION PANEL
ALT: 44 U/L (ref 0–44)
AST: 30 U/L (ref 15–41)
Albumin: 3.9 g/dL (ref 3.5–5.0)
Alkaline Phosphatase: 87 U/L (ref 38–126)
Bilirubin, Direct: <0.1 mg/dL (ref 0.0–0.2)
Total Bilirubin: 0.7 mg/dL (ref 0.3–1.2)
Total Protein: 7.7 g/dL (ref 6.5–8.1)

## 2021-04-05 LAB — URINALYSIS, ROUTINE W REFLEX MICROSCOPIC
Bilirubin Urine: NEGATIVE
Glucose, UA: 50 mg/dL — AB
Hgb urine dipstick: NEGATIVE
Ketones, ur: NEGATIVE mg/dL
Leukocytes,Ua: NEGATIVE
Nitrite: NEGATIVE
Protein, ur: 100 mg/dL — AB
Specific Gravity, Urine: 1.026 (ref 1.005–1.030)
pH: 5 (ref 5.0–8.0)

## 2021-04-05 LAB — BASIC METABOLIC PANEL
Anion gap: 10 (ref 5–15)
BUN: 23 mg/dL — ABNORMAL HIGH (ref 6–20)
CO2: 23 mmol/L (ref 22–32)
Calcium: 9 mg/dL (ref 8.9–10.3)
Chloride: 101 mmol/L (ref 98–111)
Creatinine, Ser: 1.23 mg/dL (ref 0.61–1.24)
GFR, Estimated: >60 mL/min (ref >60)
Glucose, Bld: 237 mg/dL — ABNORMAL HIGH (ref 70–99)
Potassium: 4.1 mmol/L (ref 3.5–5.1)
Sodium: 134 mmol/L — ABNORMAL LOW (ref 135–145)

## 2021-04-05 LAB — RESP PANEL BY RT-PCR (FLU A&B, COVID) ARPGX2
Influenza A by PCR: NEGATIVE
Influenza B by PCR: NEGATIVE
SARS Coronavirus 2 by RT PCR: NEGATIVE

## 2021-04-05 LAB — CBC
HCT: 45.4 % (ref 39.0–52.0)
Hemoglobin: 15.3 g/dL (ref 13.0–17.0)
MCH: 29.5 pg (ref 26.0–34.0)
MCHC: 33.7 g/dL (ref 30.0–36.0)
MCV: 87.6 fL (ref 80.0–100.0)
Platelets: 215 10*3/uL (ref 150–400)
RBC: 5.18 MIL/uL (ref 4.22–5.81)
RDW: 13.7 % (ref 11.5–15.5)
WBC: 6.7 10*3/uL (ref 4.0–10.5)
nRBC: 0 % (ref 0.0–0.2)

## 2021-04-05 LAB — LIPASE, BLOOD: Lipase: 34 U/L (ref 11–51)

## 2021-04-05 LAB — TROPONIN I (HIGH SENSITIVITY)
Troponin I (High Sensitivity): 7 ng/L (ref <18)
Troponin I (High Sensitivity): 7 ng/L (ref <18)

## 2021-04-05 LAB — BRAIN NATRIURETIC PEPTIDE: B Natriuretic Peptide: 11.2 pg/mL (ref 0.0–100.0)

## 2021-04-05 MED ORDER — IOHEXOL 350 MG/ML SOLN
125.0000 mL | Freq: Once | INTRAVENOUS | Status: AC | PRN
  Administered 2021-04-05: 125 mL via INTRAVENOUS
  Filled 2021-04-05: qty 125

## 2021-04-05 NOTE — ED Provider Notes (Addendum)
[facility] Emergency Department Provider Note    ____________________________________________   Event Date/Time   First MD Initiated Contact with Patient 04/05/21 1922     (approximate)  I have reviewed the triage vital signs and the nursing notes.   HISTORY  Chief Complaint Chest Pain   HPI Anthony Fuller is a 60 y.o. male, history of hypertension, diabetes, presents to the emergency department for evaluation of chest pain.  Patient states that his blood pressure has been elevated for the past 2 days.  Reports having a blood pressure over 200 yesterday, with a blood pressure of 180 today.  Additionally endorses swelling in his scrotum and left lower extremity that concerned both him and his wife.  He endorses some chest pain today, which he describes as a intermittent "shocking, gripping like sensation that starts under my breasts and radiates down to my legs". Describes sensation of feeling like he is going to die. Denies recent illness/injury. Denies fever/chills, back pain, abdominal pain.   History limited by: No limitations.  Past Medical History:  Diagnosis Date  . Acid indigestion 02/14/2012  . Anxiety   . Arthritis    BIL.KNEES  . 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 Cancer ([place]) 2012   R kidney removed  . Cervical stenosis of spine   . Cervicalgia   . Chest wall pain   . Clostridium difficile infection 02/04/2014  . DDD (degenerative disc disease), lumbar   . DDD (degenerative disc disease), lumbar   . Degeneration of intervertebral disc of lumbar region 08/23/2011  . Diabetes mellitus ([place]) 07/24/2011  . Diabetes mellitus without complication ([place])   . Diabetic polyneuropathy associated with type 2 diabetes mellitus ([place])   . Diarrhea   . DM (diabetes mellitus) type 2, uncontrolled, with ketoacidosis ([place]) 05/30/2015  . Dyspepsia   . Enthesopathy of hip 04/11/2015  . Fatty liver disease, nonalcoholic 05/15/4578  . GERD  (gastroesophageal reflux disease)   . Headache   . Hepatic cirrhosis ([place]) 09/12/2014  . Hydrocele   . Hydrocele   . Hypertension   . IBS (irritable bowel syndrome)   . Lumbar herniated disc   . Lumbar radiculopathy 09/05/2011  . Lumbar stenosis   . Peroneal tendon injury, right, initial encounter   . Peroneal tendon tear   . Radiculopathy   . 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  Marland Kitchen Sleep apnea    snores  . Stenosis of cervical spine   . Thoracic and lumbosacral neuritis 07/24/2011  . Thrombocytopathia ([place])   . Thrombocytopenia [facility])     Patient Active Problem List   Diagnosis Date Noted  . Gastroesophageal reflux disease without esophagitis 08/05/2016  . Diabetic polyneuropathy associated with type 2 diabetes mellitus ([place]) 02/03/2016  . Essential hypertension 07/12/2015  . Cervical spinal stenosis 06/24/2015  . Sepsis ([place]) 06/16/2015  . Left sided abdominal pain 05/30/2015  . Chest pain 05/30/2015  . CKD (chronic kidney disease), 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  . Hyponatremia 05/30/2015  . Leukocytosis 05/30/2015  . DM (diabetes mellitus) type 2, uncontrolled, with ketoacidosis ([place]) 05/30/2015  . Solitary kidney 05/30/2015  . Acute pyelonephritis 05/27/2015  . Pain due to ureteral stent ([place]) 05/27/2015  . Enthesopathy of hip 04/11/2015  . Hepatic cirrhosis ([place]) 09/12/2014  . Fatty liver disease, nonalcoholic 99/83/3825  . Cervical pain 08/06/2014  . Clostridium difficile infection 02/04/2014  . D (diarrhea) 12/17/2013  . Arthralgia of hip 08/26/2013  . H/O urinary disorder 06/29/2013  .  Acid indigestion 02/14/2012  . LBP (low back pain) 12/18/2011  . 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 Lumbar radiculopathy 09/05/2011  . Degeneration of intervertebral disc of lumbar region 08/23/2011  . Lumbar canal stenosis 08/23/2011  . 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 Thoracic and lumbosacral neuritis 07/24/2011  . Diabetes  mellitus ([place]) 07/24/2011    Past Surgical History:  Procedure Laterality Date  . BACK SURGERY     cervical fusion C5-7  . CARDIAC CATHETERIZATION Right 06/20/2015   Procedure: Left Heart Cath and Coronary Angiography;  Surgeon: Dionisio David, MD;  Location: [place];  Service: Cardiovascular;  Laterality: Right;  . COLONOSCOPY    . CYSTOSCOPY W/ RETROGRADES Left 05/18/2015   Procedure: CYSTOSCOPY WITH RETROGRADE PYELOGRAM;  Surgeon: Nickie Retort, MD;  Location: [facility] ORS;  Service: Urology;  Laterality: Left;  . CYSTOSCOPY WITH STENT PLACEMENT Left 05/18/2015   Procedure: CYSTOSCOPY WITH STENT PLACEMENT;  Surgeon: Nickie Retort, MD;  Location: [facility];  Service: Urology;  Laterality: Left;  . CYSTOSCOPY WITH STENT PLACEMENT Left 06/28/2015   Procedure: CYSTOSCOPY WITH STENT PLACEMENT/ EXCHANGE;  Surgeon: Hollice Espy, MD;  Location: [facility];  Service: Urology;  Laterality: Left;  . ESOPHAGOGASTRODUODENOSCOPY (EGD) WITH PROPOFOL N/A 09/03/2016   Procedure: ESOPHAGOGASTRODUODENOSCOPY (EGD) WITH PROPOFOL;  Surgeon: Manya Silvas, MD;  Location: [facility] ENDOSCOPY;  Service: Endoscopy;  Laterality: N/A;  . ESOPHAGOGASTRODUODENOSCOPY ENDOSCOPY N/A 03/14/2018   Procedure: ESOPHAGOGASTRODUODENOSCOPY ENDOSCOPY;  Surgeon: Manya Silvas, MD;  Location: [facility] ENDOSCOPY;  Service: Endoscopy;  Laterality: N/A;  . HALLUX VALGUS BASE WEDGE Right 08/23/2017   Procedure: HALLUX VALGUS BASE WEDGE;  Surgeon: Samara Deist, DPM;  Location: [facility] ORS;  Service: Podiatry;  Laterality: Right;  . INSERTION OF MESH  02/27/2021   Procedure: INSERTION OF MESH;  Surgeon: Herbert Pun, MD;  Location: [facility] ORS;  Service: General;;  . JOINT REPLACEMENT     right knee   . kidney removed Right    2012  . NAFLD    . NEPHRECTOMY Right 04/2011  . ORIF TOE FRACTURE Right 08/23/2017   Procedure: OPEN REDUCTION INTERNAL FIXATION (ORIF) METATARSAL (TOE) FRACTURE;  Surgeon: Samara Deist, DPM;   Location: [facility] OR[facility];  Service: Podiatry;  Laterality: Right;  . SPINE SURGERY     lumbar 2013  . URETEROSCOPY WITH HOLMIUM LASER LITHOTRIPSY Left 05/18/2015   Procedure: URETEROSCOPY WITH HOLMIUM LASER LITHOTRIPSY;  Surgeon: Nickie Retort, MD;  Location: [facility];  Service: Urology;  Laterality: Left;  . URETEROSCOPY WITH HOLMIUM LASER LITHOTRIPSY Left 06/28/2015   Procedure: URETEROSCOPY WITH HOLMIUM LASER LITHOTRIPSY;  Surgeon: Hollice Espy, MD;  Location: [facility] OR[facility];  Service: Urology;  Laterality: Left;    Prior to Admission medications   Medication Sig Start Date End Date Taking? Authorizing Provider  cetirizine (ZYRTEC) 10 MG tablet Take 10 mg by mouth daily as needed for allergies.    [provider]  gabapentin (NEURONTIN) 400 MG capsule Take 400 mg by mouth 3 (three) times daily.    [provider]  insulin NPH-regular Human (70-30) 100 UNIT/ML injection Inject 55-60 Units into the skin See admin instructions. Inject 60 units in the morning and 55 units at night    [provider]  lidocaine (XYLOCAINE) 5 % ointment Apply 1 application topically 2 (two) times daily.    [provider]  metFORMIN (GLUCOPHAGE-XR) 500 MG 24 hr tablet Take 500-1,000 mg by mouth See admin instructions. Take 1000 mg in  the morning and 500 mg in the evening 12/05/15   [provider]  Multiple Vitamin (MULTIVITAMIN WITH MINERALS) TABS tablet Take 1 tablet by mouth daily.    [provider]  omeprazole (PRILOSEC) 40 MG capsule Take 40 mg by mouth daily.    [provider]  tiZANidine (ZANAFLEX) 4 MG capsule Take 4 mg by mouth 3 (three) times daily.    [provider]  traMADol (ULTRAM) 50 MG tablet Take 50 mg by mouth every 6 (six) hours as needed for moderate pain. for pain    [provider]  traMADol (ULTRAM) 50 MG tablet Take 1 tablet (50 mg total) by mouth every 6 (six) hours as needed. 02/27/21 02/27/22  Herbert Pun,  MD    Allergies Hydromorphone, Aspirin, Nsaids, and Floxin [ofloxacin]  Family History  Problem Relation Age of Onset  . Diabetes Mother   . Hypertension Mother   . Asthma Mother   . Heart disease Father   . Kidney disease Father   . Diabetes Father   . Stroke Father     Social History Social History   Tobacco Use  . Smoking status: Never  . Smokeless tobacco: Never  Vaping Use  . Vaping Use: Never used  Substance Use Topics  . Alcohol use: No  . Drug use: No    Review of Systems  Constitutional: Positive for fatigue. Negative for fever/chills, weight loss.   Eyes: Negative for visual changes or discharge.  ENT: Negative for congestion, hearing changes, or sore throat.  Thoracic: Positive for chest pain. Gastrointestinal: Negative for abdominal pain, nausea/vomiting, or diarrhea.  Genitourinary: Negative for dysuria or hematuria.  Musculoskeletal: Negative for back pain or joint pain.  Skin: Negative for rashes or lesions.  Neurological: Positive for headache.  Negative for syncope, dizziness, tremors, or numbness/tingling.   10-point ROS otherwise negative. ____________________________________________   PHYSICAL EXAM:  VITAL SIGNS: ED Triage Vitals  Enc Vitals Group     BP 04/05/21 1539 (!) 155/94     Pulse Rate 04/05/21 1539 72     Resp 04/05/21 1539 16     Temp 04/05/21 1539 98.3 F (36.8 C)     Temp Source 04/05/21 1539 Oral     SpO2 04/05/21 1539 96 %     Weight 04/05/21 1537 (!) 306 lb (138.8 kg)     Height 04/05/21 1537 6\' 2"  (1.88 m)     Head Circumference --      Peak Flow --      Pain Score 04/05/21 1537 7     Pain Loc --      Pain Edu? --      Excl. in [place]? --     Physical Exam Constitutional:      Appearance: He is obese. He is not diaphoretic.     Comments: Appears anxious and unwell.   HENT:     Head: Normocephalic and atraumatic.  Eyes:     Extraocular Movements: Extraocular movements intact.     Pupils: Pupils are equal,  round, and reactive to light.  Cardiovascular:     Rate and Rhythm: Normal rate and regular rhythm.     Pulses: Normal pulses.     Heart sounds: Normal heart sounds.  Pulmonary:     Effort: Pulmonary effort is normal.     Breath sounds: No stridor. No wheezing, rhonchi or rales.     Comments: Reports diffuse chest wall tenderness. Abdominal:     General: Abdomen is flat.  Palpations: Abdomen is soft.     Comments: Mild distention noted.  Genitourinary:    Penis: Normal.      Testes: Normal.     Comments: No erythema or swelling noted on the penis or scrotum. Musculoskeletal:        General: Normal range of motion.     Cervical back: Normal range of motion.  Skin:    Comments: Patient is notably erythematous along his head, torso, upper extremities, and feet.   Neurological:     Mental Status: He is alert and oriented to person, place, and time.     Cranial Nerves: No cranial nerve deficit.     Sensory: Sensory deficit present.     Motor: No weakness.     Comments: Sensory deficits in his lower extremities below the ankles, consistent with his baseline neuropathy.  Psychiatric:        Mood and Affect: Mood normal.        Behavior: Behavior normal.        Thought Content: Thought content normal.        Judgment: Judgment normal.     ____________________________________________    LABS  (all labs ordered are listed, but only abnormal results are displayed)  Labs Reviewed  BASIC METABOLIC PANEL - Abnormal; Notable for the following components:      Result Value   Sodium 134 (*)    Glucose, Bld 237 (*)    BUN 23 (*)    All other components within normal limits  URINALYSIS, ROUTINE W REFLEX MICROSCOPIC - Abnormal; Notable for the following components:   Color, Urine YELLOW (*)    APPearance HAZY (*)    Glucose, UA 50 (*)    Protein, ur 100 (*)    Bacteria, UA RARE (*)    All other components within normal limits  RESP PANEL BY RT-PCR (FLU A&B, COVID) [id number]   URINE CULTURE  CBC  BRAIN NATRIURETIC PEPTIDE  HEPATIC FUNCTION PANEL  LIPASE, BLOOD  TROPONIN I (HIGH SENSITIVITY)  TROPONIN I (HIGH SENSITIVITY)     ____________________________________________   EKG Sinus rhythm, rate of 72, no ST segment changes, no AV blocks, no axis deviations.   ____________________________________________    RADIOLOGY I personally viewed and evaluated these images as part of my medical decision making, as well as reviewing the written report by the radiologist.  ED Provider Interpretation: I agree with radiologist interpretation of these findings.  DG Chest 2 View  Result Date: 04/05/2021 CLINICAL DATA:  Chest pain. EXAM: CHEST - 2 VIEW COMPARISON:  12/16/2017. FINDINGS: The heart size and mediastinal contours are within normal limits. Both lungs are clear. No visible pleural effusions or pneumothorax. No evidence of acute fracture. Thoracic spine degenerative change. Cervical ACDF. IMPRESSION: No evidence of acute cardiopulmonary disease. Electronically Signed   By: Margaretha Sheffield M.D.   On: 04/05/2021 16:10   US Venous Img Lower Bilateral  Result Date: 04/05/2021 CLINICAL DATA:  Bilateral lower extremity swelling. EXAM: Bilateral LOWER EXTREMITY VENOUS DOPPLER ULTRASOUND TECHNIQUE: Gray-scale sonography with compression, as well as color and duplex ultrasound, were performed to evaluate the deep venous system(s) from the level of the common femoral vein through the popliteal and proximal calf veins. COMPARISON:  None. FINDINGS: VENOUS Normal compressibility of the common femoral, superficial femoral, and popliteal veins, as well as the visualized calf veins. Visualized portions of profunda femoral vein and great saphenous vein unremarkable. No filling defects to suggest DVT on grayscale or color Doppler imaging. Doppler waveforms  show normal direction of venous flow, normal respiratory plasticity and response to augmentation. Limited views of the  contralateral common femoral vein are unremarkable. OTHER None. Limitations: none IMPRESSION: Negative. Electronically Signed   By: Anner Crete M.D.   On: 04/05/2021 21:22   CT CHEST ABDOMEN PELVIS WO CONTRAST  Addendum Date: 04/05/2021   ADDENDUM REPORT: 04/05/2021 23:49 ADDENDUM: Please note the described loculated collection within the pelvis may represent a postsurgical seroma. Clinical correlation is recommended Electronically Signed   By: Anner Crete M.D.   On: 04/05/2021 23:49   Result Date: 04/05/2021 CLINICAL DATA:  Abdominal pain.  Concern for aortic dissection. EXAM: CT CHEST, ABDOMEN AND PELVIS WITHOUT CONTRAST TECHNIQUE: Multidetector CT imaging of the chest, abdomen and pelvis was performed following the standard protocol without IV contrast. COMPARISON:  CT abdomen pelvis dated 03/06/2018. FINDINGS: Evaluation of this exam is limited in the absence of intravenous contrast. CT CHEST FINDINGS Cardiovascular: There is no cardiomegaly or pericardial effusion. The thoracic aorta is grossly unremarkable on this noncontrast CT. No periaortic inflammatory changes. The central pulmonary arteries appear unremarkable. Mediastinum/Nodes: No hilar or mediastinal adenopathy. The esophagus and the thyroid gland are grossly unremarkable. No mediastinal fluid collection. Lungs/Pleura: Several small calcified granuloma. No focal consolidation, pleural effusion, or pneumothorax. The central airways are patent. Musculoskeletal: Degenerative changes of the spine. No acute osseous pathology. CT ABDOMEN PELVIS FINDINGS No intra-abdominal free air or free fluid. Hepatobiliary: Diffuse fatty liver. No intrahepatic biliary ductal dilatation. The gallbladder is unremarkable. Pancreas: Unremarkable. No pancreatic ductal dilatation or surrounding inflammatory changes. Spleen: Normal in size without focal abnormality. Adrenals/Urinary Tract: The adrenal glands unremarkable. Status post prior right nephrectomy.  The left kidney is unremarkable. Excreted contrast noted in the left renal collecting system. The urinary bladder is minimally distended and contains excreted contrast. Stomach/Bowel: There is no bowel obstruction or active inflammation. The appendix is normal. Vascular/Lymphatic: The abdominal aorta and IVC are grossly unremarkable on this noncontrast CT. No portal venous gas. There is no adenopathy Reproductive: The prostate and seminal vesicles are grossly unremarkable. Other: Apparent hernia repair mesh within the pelvis. There is a 5.6 x 3.6 x 5.5 cm loculated collection along the mesh with surrounding stranding concerning for an infected collection or abscess. There is a small fat containing right inguinal hernia. Musculoskeletal: Degenerative changes of the spine. No acute osseous pathology. IMPRESSION: 1. No acute intrathoracic pathology. 2. A 5.6 x 3.6 x 5.5 cm loculated collection along the hernia repair mesh within the pelvis concerning for an infected collection or abscess. 3. Fatty liver. 4. Status post prior right nephrectomy. Electronically Signed: By: Anner Crete M.D. On: 04/05/2021 23:07    ____________________________________________   PROCEDURES  Procedures   Medications  iohexol (OMNIPAQUE) 350 MG/ML injection 125 mL (125 mLs Intravenous Contrast Given 04/05/21 2212)    Critical Care performed: No  ____________________________________________   INITIAL IMPRESSION / ASSESSMENT AND PLAN / ED COURSE  Pertinent labs & imaging results that were available during my care of the patient were reviewed by me and considered in my medical decision making (see chart for details).       SUKHDEEP WIETING is a 60 y.o. male, history of hypertension, diabetes, presents to the emergency department for evaluation of hypertension and chest pain.  Additionally endorses headache, fatigue, and sense of impending death.  History significant for blood pressure of 710 systolic yesterday, as well  as acute onset of scrotal and left leg swelling.  Chart review shows that the  patient was recently seen by his family physician who diagnosed him with cellulitis on the scrotum and treated with doxycycline.  Physical exam significant for diffuse erythema across his body, chest wall tenderness, and 2+ pitting edema in the left lower extremity.  Initial work-up is unremarkable.  Chest x-ray shows no signs of cardiopulmonary disease.  No leukocytosis noted on CBC.  BMP unremarkable.  EKG shows sinus rhythm with no ST segment changes.  Will add BNP, UA, hepatic function panel, and bilateral venous ultrasound to evaluate for DVT's. If negative, will perform CT angio to rule out aortic dissection.   Differentials included, but not limited to: Serious: aortic dissection, ACS, pulmonary embolism, cardiac tamponade, Boerhaave syndrome, pneumothorax, myocarditis, pericarditis, acute chest syndrome, aortic stenosis. Common/Non-emergent: gastritis, esophagitis, costochondritis, pneumonia, anxiety  Clinical Course as of 04/06/21 0041  Wed Apr 05, 2021  1924 Troponin I (High Sensitivity): 7 [BS]  2221 Ultrasound negative for evidence of DVTs bilaterally. [BS]  2224 Radiology called stating the patient experienced IV extravasation of contrast halfway through the exam.  Patient states that he does not want to finish the exam with any more contrast due to concern for his kidneys.  We will complete the abdominal/pelvic CT without contrast. [BS]  2250 Examined the patient's right arm at the site of extravasation.  Notable swelling, but no tenderness or erythema.  Pulse, motor, sensation intact distally.  Provided the patient with warm compress. [BS]  2257 Respiratory panel negative for COVID or influenza.  Unremarkable hepatic function panel.,  unremarkable lipase.  BNP negative.  [BS]  2307 UA positive for proteinuria and bacteria present.  Will order urine culture. [BS]  2315 CT chest, abdomen, pelvis unremarkable  for dissection. Radiologist incidentally found loculated collection along hernia repair mesh concerning for infected collection vs abscess. Spoke with Dr. Dahlia Byes with general surgery who reviewed the images and states that he believes the collection is likely a benign seroma and that the patient is safe for discharge with outpatient follow up. After further discussion with radiology, agreed that this could be a seroma and they will addend their note.  [BS]    Clinical Course User Index [BS] Teodoro Spray, PA   Upon reexamination, patient appears well.  He is not actively in any pain at this time.  Work-up thus far has been negative for evidence of ACS, dissection, or acute abdominal pathology.  No evidence of DVT's with little suspicion for PE.  UA positive for bacteria and proteinuria, though specific gravity > 1.025 lowers diagnostic significance of protein.  Urine culture pending. I suspect that the patient's edema and transient symptoms may be related to his uncontrolled hypertension at home.  His blood pressure has been slightly elevated at 155/70 for the past few hours. On CT, there was some concern for a possible infected collection/abscess along the hernia mesh wall. However, given the patient's lack of fever, leukocytosis, or endorsement of any remarkable abdominal pain, it is most likely a seroma as Dr. Dahlia Byes indicated and does not clinically explain the patient's symptoms the past 2 days. Discussed these findings with the patient and expressed to him that there is some diagnostic uncertainty with this interpretation. However, he states that he feels comfortable being discharged tonight and did not wish to be admitted. No further treatment or testing indicated at this time in the emergency department.  We will plan to discharge the patient with close primary care follow-up and strict return precautions.   ____________________________________________   FINAL CLINICAL IMPRESSION(S) /  ED  DIAGNOSES  Final diagnoses:  Chest pain, unspecified type  Hypertension, unspecified type     NEW MEDICATIONS STARTED DURING THIS VISIT:  ED Discharge Orders     None        Note:  This document was prepared using Dragon voice recognition software and may include unintentional dictation errors.    Teodoro Spray, [place] 04/05/21 [place], [place], [place] 04/06/21 4403    Vanessa Gibson, MD 04/06/21 [id number]

## 2021-04-05 NOTE — ED Triage Notes (Signed)
Pt c/o HA, "pain under my boobs", fatigue since yesterday. Pt is in NAD on arrival

## 2021-04-05 NOTE — ED Provider Notes (Incomplete Revision)
[facility] Emergency Department Provider Note    ____________________________________________   Event Date/Time   First MD Initiated Contact with Patient 04/05/21 1922     (approximate)  I have reviewed the triage vital signs and the nursing notes.   HISTORY  Chief Complaint Chest Pain    HPI Anthony Fuller is a 60 y.o. male, history of hypertension, diabetes, presents to the emergency department for evaluation of chest pain.  Patient states that his blood pressure has been elevated for the past 2 days.  Reports having a blood pressure over 200 yesterday, with a blood pressure of 180 today.  Additionally endorses swelling in his scrotum and left lower extremity that concerned both him and his wife.  He endorses some chest pain today, which she describes as a intermittent "shocking, gripping like sensation that starts under my breasts and radiates down to my legs". Describes sensation of feeling like he is going to die. Denies recent illness/injury. Denies fever/chills, back pain, abdominal pain.   History limited by: No limitations.  Past Medical History:  Diagnosis Date   Acid indigestion 02/14/2012   Anxiety    Arthritis    BIL.KNEES   Bulge of lumbar disc without myelopathy 09/05/2011   Cancer ([place]) 2012   R kidney removed   Cervical stenosis of spine    Cervicalgia    Chest wall pain    Clostridium difficile infection 02/04/2014   DDD (degenerative disc disease), lumbar    DDD (degenerative disc disease), lumbar    Degeneration of intervertebral disc of lumbar region 08/23/2011   Diabetes mellitus ([place]) 07/24/2011   Diabetes mellitus without complication ([place])    Diabetic polyneuropathy associated with type 2 diabetes mellitus ([place])    Diarrhea    DM (diabetes mellitus) type 2, uncontrolled, with ketoacidosis ([place]) 05/30/2015   Dyspepsia    Enthesopathy of hip 04/11/2015   Fatty liver disease, nonalcoholic 1/0/1751   GERD (gastroesophageal reflux  disease)    Headache    Hepatic cirrhosis (HCC) 09/12/2014   Hydrocele    Hydrocele    Hypertension    IBS (irritable bowel syndrome)    Lumbar herniated disc    Lumbar radiculopathy 09/05/2011   Lumbar stenosis    Peroneal tendon injury, right, initial encounter    Peroneal tendon tear    Radiculopathy    Renal insufficiency    Right Nephrectomy due to [place].   Sleep apnea    snores   Stenosis of cervical spine    Thoracic and lumbosacral neuritis 07/24/2011   Thrombocytopathia ([place])    Thrombocytopenia [facility])     Patient Active Problem List   Diagnosis Date Noted   Gastroesophageal reflux disease without esophagitis 08/05/2016   Diabetic polyneuropathy associated with type 2 diabetes mellitus ([place]) 02/03/2016   Essential hypertension 07/12/2015   Cervical spinal stenosis 06/24/2015   Sepsis ([place]) 06/16/2015   Left sided abdominal pain 05/30/2015   Chest pain 05/30/2015   CKD (chronic kidney disease), 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   Hyponatremia 05/30/2015   Leukocytosis 05/30/2015   DM (diabetes mellitus) type 2, uncontrolled, with ketoacidosis ([place]) 05/30/2015   Solitary kidney 05/30/2015   Acute pyelonephritis 05/27/2015   Pain due to ureteral stent ([place]) 05/27/2015   Enthesopathy of hip 04/11/2015   Hepatic cirrhosis ([place]) 09/12/2014   Fatty liver disease, nonalcoholic 02/58/5277   Cervical pain 08/06/2014   Clostridium difficile infection 02/04/2014   D (diarrhea) 12/17/2013   Arthralgia of hip 08/26/2013   H/O urinary disorder  06/29/2013   Acid indigestion 02/14/2012   LBP (low back pain) 12/18/2011   Bulge of lumbar disc without myelopathy 09/05/2011   Lumbar radiculopathy 09/05/2011   Degeneration of intervertebral disc of lumbar region 08/23/2011   Lumbar canal stenosis 08/23/2011   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   Thoracic and lumbosacral neuritis 07/24/2011   Diabetes mellitus ([place]) 07/24/2011    Past Surgical History:  Procedure Laterality  Date   BACK SURGERY     cervical fusion C5-7   CARDIAC CATHETERIZATION Right 06/20/2015   Procedure: Left Heart Cath and Coronary Angiography;  Surgeon: Dionisio David, MD;  Location: [facility];  Service: Cardiovascular;  Laterality: Right;   COLONOSCOPY     CYSTOSCOPY W/ RETROGRADES Left 05/18/2015   Procedure: CYSTOSCOPY WITH RETROGRADE PYELOGRAM;  Surgeon: Nickie Retort, MD;  Location: [facility];  Service: Urology;  Laterality: Left;   CYSTOSCOPY WITH STENT PLACEMENT Left 05/18/2015   Procedure: CYSTOSCOPY WITH STENT PLACEMENT;  Surgeon: Nickie Retort, MD;  Location: [facility];  Service: Urology;  Laterality: Left;   CYSTOSCOPY WITH STENT PLACEMENT Left 06/28/2015   Procedure: CYSTOSCOPY WITH STENT PLACEMENT/ EXCHANGE;  Surgeon: Hollice Espy, MD;  Location: [facility];  Service: Urology;  Laterality: Left;   ESOPHAGOGASTRODUODENOSCOPY (EGD) WITH PROPOFOL N/A 09/03/2016   Procedure: ESOPHAGOGASTRODUODENOSCOPY (EGD) WITH PROPOFOL;  Surgeon: Manya Silvas, MD;  Location: [facility] ENDOSCOPY;  Service: Endoscopy;  Laterality: N/A;   ESOPHAGOGASTRODUODENOSCOPY ENDOSCOPY N/A 03/14/2018   Procedure: ESOPHAGOGASTRODUODENOSCOPY ENDOSCOPY;  Surgeon: Manya Silvas, MD;  Location: [facility]COPY;  Service: Endoscopy;  Laterality: N/A;   HALLUX VALGUS BASE WEDGE Right 08/23/2017   Procedure: HALLUX VALGUS BASE WEDGE;  Surgeon: Samara Deist, DPM;  Location: [facility];  Service: Podiatry;  Laterality: Right;   INSERTION OF MESH  02/27/2021   Procedure: INSERTION OF MESH;  Surgeon: Herbert Pun, MD;  Location: [facility];  Service: General;;   JOINT REPLACEMENT     right knee    kidney removed Right    2012   NAFLD     NEPHRECTOMY Right 04/2011   ORIF TOE FRACTURE Right 08/23/2017   Procedure: OPEN REDUCTION INTERNAL FIXATION (ORIF) METATARSAL (TOE) FRACTURE;  Surgeon: Samara Deist, DPM;  Location: [facility];  Service: Podiatry;  Laterality: Right;   SPINE SURGERY     lumbar 2013    URETEROSCOPY WITH HOLMIUM LASER LITHOTRIPSY Left 05/18/2015   Procedure: URETEROSCOPY WITH HOLMIUM LASER LITHOTRIPSY;  Surgeon: Nickie Retort, MD;  Location: [facility];  Service: Urology;  Laterality: Left;   URETEROSCOPY WITH HOLMIUM LASER LITHOTRIPSY Left 06/28/2015   Procedure: URETEROSCOPY WITH HOLMIUM LASER LITHOTRIPSY;  Surgeon: Hollice Espy, MD;  Location: [facility];  Service: Urology;  Laterality: Left;    Prior to Admission medications   Medication Sig Start Date End Date Taking? Authorizing Provider  cetirizine (ZYRTEC) 10 MG tablet Take 10 mg by mouth daily as needed for allergies.    [provider]  gabapentin (NEURONTIN) 400 MG capsule Take 400 mg by mouth 3 (three) times daily.    [provider]  insulin NPH-regular Human (70-30) 100 UNIT/ML injection Inject 55-60 Units into the skin See admin instructions. Inject 60 units in the morning and 55 units at night    [provider]  lidocaine (XYLOCAINE) 5 % ointment Apply 1 application topically 2 (two) times daily.    [provider]  metFORMIN (GLUCOPHAGE-XR) 500 MG 24 hr tablet Take 500-1,000 mg by mouth See admin instructions. Take  1000 mg in the morning and 500 mg in the evening 12/05/15   [provider]  Multiple Vitamin (MULTIVITAMIN WITH MINERALS) TABS tablet Take 1 tablet by mouth daily.    [provider]  omeprazole (PRILOSEC) 40 MG capsule Take 40 mg by mouth daily.    [provider]  tiZANidine (ZANAFLEX) 4 MG capsule Take 4 mg by mouth 3 (three) times daily.    [provider]  traMADol (ULTRAM) 50 MG tablet Take 50 mg by mouth every 6 (six) hours as needed for moderate pain. for pain    [provider]  traMADol (ULTRAM) 50 MG tablet Take 1 tablet (50 mg total) by mouth every 6 (six) hours as needed. 02/27/21 02/27/22  Herbert Pun, MD    Allergies Hydromorphone, Aspirin, Nsaids, and Floxin [ofloxacin]  Family History   Problem Relation Age of Onset   Diabetes Mother    Hypertension Mother    Asthma Mother    Heart disease Father    Kidney disease Father    Diabetes Father    Stroke Father     Social History Social History   Tobacco Use   Smoking status: Never   Smokeless tobacco: Never  Vaping Use   Vaping Use: Never used  Substance Use Topics   Alcohol use: No   Drug use: No    Review of Systems  Constitutional: Positive for fatigue. Negative for fever/chills, weight loss.   Eyes: Negative for visual changes or discharge.  ENT: Negative for congestion, hearing changes, or sore throat.  Thoracic: Positive for chest pain. Gastrointestinal: Negative for abdominal pain, nausea/vomiting, or diarrhea.  Genitourinary: Negative for dysuria or hematuria.  Musculoskeletal: Negative for back pain or joint pain.  Skin: Negative for rashes or lesions.  Neurological: Positive for headache.  Negative for syncope, dizziness, tremors, or numbness/tingling.   10-point ROS otherwise negative. ____________________________________________   PHYSICAL EXAM:  VITAL SIGNS: ED Triage Vitals  Enc Vitals Group     BP 04/05/21 1539 (!) 155/94     Pulse Rate 04/05/21 1539 72     Resp 04/05/21 1539 16     Temp 04/05/21 1539 98.3 F (36.8 C)     Temp Source 04/05/21 1539 Oral     SpO2 04/05/21 1539 96 %     Weight 04/05/21 1537 (!) 306 lb (138.8 kg)     Height 04/05/21 1537 6\' 2"  (1.88 m)     Head Circumference --      Peak Flow --      Pain Score 04/05/21 1537 7     Pain Loc --      Pain Edu? --      Excl. in [place]? --     Physical Exam Constitutional:      Appearance: He is obese. He is not diaphoretic.     Comments: Appears anxious and unwell.   HENT:     Head: Normocephalic and atraumatic.  Eyes:     Extraocular Movements: Extraocular movements intact.     Pupils: Pupils are equal, round, and reactive to light.  Cardiovascular:     Rate and Rhythm: Normal rate and regular rhythm.      Pulses: Normal pulses.     Heart sounds: Normal heart sounds.  Pulmonary:     Effort: Pulmonary effort is normal.     Breath sounds: No stridor. No wheezing, rhonchi or rales.     Comments: Reports diffuse chest wall tenderness. Abdominal:     General: Abdomen  is flat.     Palpations: Abdomen is soft.     Comments: Mild distention noted.  Genitourinary:    Penis: Normal.      Testes: Normal.     Comments: No erythema or swelling noted on the penis or scrotum. Musculoskeletal:        General: Normal range of motion.     Cervical back: Normal range of motion.  Skin:    Comments: Patient is notably erythematous along his head, torso, upper extremities, and feet.   Neurological:     Mental Status: He is alert and oriented to person, place, and time.     Cranial Nerves: No cranial nerve deficit.     Sensory: Sensory deficit present.     Motor: No weakness.     Comments: Sensory deficits in his lower extremities below the ankles, consistent with his baseline neuropathy.  Psychiatric:        Mood and Affect: Mood normal.        Behavior: Behavior normal.        Thought Content: Thought content normal.        Judgment: Judgment normal.     ____________________________________________    LABS  (all labs ordered are listed, but only abnormal results are displayed)  Labs Reviewed  BASIC METABOLIC PANEL - Abnormal; Notable for the following components:      Result Value   Sodium 134 (*)    Glucose, Bld 237 (*)    BUN 23 (*)    All other components within normal limits  URINALYSIS, ROUTINE W REFLEX MICROSCOPIC - Abnormal; Notable for the following components:   Color, Urine YELLOW (*)    APPearance HAZY (*)    Glucose, UA 50 (*)    Protein, ur 100 (*)    Bacteria, UA RARE (*)    All other components within normal limits  RESP PANEL BY RT-PCR (FLU A&B, COVID) ARPGX2  URINE CULTURE  CBC  BRAIN NATRIURETIC PEPTIDE  HEPATIC FUNCTION PANEL  LIPASE, BLOOD  TROPONIN I (HIGH  SENSITIVITY)  TROPONIN I (HIGH SENSITIVITY)     ____________________________________________   EKG Sinus rhythm, rate of 72, no ST segment changes, no AV blocks, no axis deviations.   ____________________________________________    RADIOLOGY I personally viewed and evaluated these images as part of my medical decision making, as well as reviewing the written report by the radiologist.  ED Provider Interpretation: I agree with radiologist interpretation of these findings.  DG Chest 2 View  Result Date: 04/05/2021 CLINICAL DATA:  Chest pain. EXAM: CHEST - 2 VIEW COMPARISON:  12/16/2017. FINDINGS: The heart size and mediastinal contours are within normal limits. Both lungs are clear. No visible pleural effusions or pneumothorax. No evidence of acute fracture. Thoracic spine degenerative change. Cervical ACDF. IMPRESSION: No evidence of acute cardiopulmonary disease. Electronically Signed   By: Margaretha Sheffield M.D.   On: 04/05/2021 16:10   US Venous Img Lower Bilateral  Result Date: 04/05/2021 CLINICAL DATA:  Bilateral lower extremity swelling. EXAM: Bilateral LOWER EXTREMITY VENOUS DOPPLER ULTRASOUND TECHNIQUE: Gray-scale sonography with compression, as well as color and duplex ultrasound, were performed to evaluate the deep venous system(s) from the level of the common femoral vein through the popliteal and proximal calf veins. COMPARISON:  None. FINDINGS: VENOUS Normal compressibility of the common femoral, superficial femoral, and popliteal veins, as well as the visualized calf veins. Visualized portions of profunda femoral vein and great saphenous vein unremarkable. No filling defects to suggest DVT on grayscale  or color Doppler imaging. Doppler waveforms show normal direction of venous flow, normal respiratory plasticity and response to augmentation. Limited views of the contralateral common femoral vein are unremarkable. OTHER None. Limitations: none IMPRESSION: Negative.  Electronically Signed   By: Anner Crete M.D.   On: 04/05/2021 21:22   CT CHEST ABDOMEN PELVIS WO CONTRAST  Addendum Date: 04/05/2021   ADDENDUM REPORT: 04/05/2021 23:49 ADDENDUM: Please note the described loculated collection within the pelvis may represent a postsurgical seroma. Clinical correlation is recommended Electronically Signed   By: Anner Crete M.D.   On: 04/05/2021 23:49   Result Date: 04/05/2021 CLINICAL DATA:  Abdominal pain.  Concern for aortic dissection. EXAM: CT CHEST, ABDOMEN AND PELVIS WITHOUT CONTRAST TECHNIQUE: Multidetector CT imaging of the chest, abdomen and pelvis was performed following the standard protocol without IV contrast. COMPARISON:  CT abdomen pelvis dated 03/06/2018. FINDINGS: Evaluation of this exam is limited in the absence of intravenous contrast. CT CHEST FINDINGS Cardiovascular: There is no cardiomegaly or pericardial effusion. The thoracic aorta is grossly unremarkable on this noncontrast CT. No periaortic inflammatory changes. The central pulmonary arteries appear unremarkable. Mediastinum/Nodes: No hilar or mediastinal adenopathy. The esophagus and the thyroid gland are grossly unremarkable. No mediastinal fluid collection. Lungs/Pleura: Several small calcified granuloma. No focal consolidation, pleural effusion, or pneumothorax. The central airways are patent. Musculoskeletal: Degenerative changes of the spine. No acute osseous pathology. CT ABDOMEN PELVIS FINDINGS No intra-abdominal free air or free fluid. Hepatobiliary: Diffuse fatty liver. No intrahepatic biliary ductal dilatation. The gallbladder is unremarkable. Pancreas: Unremarkable. No pancreatic ductal dilatation or surrounding inflammatory changes. Spleen: Normal in size without focal abnormality. Adrenals/Urinary Tract: The adrenal glands unremarkable. Status post prior right nephrectomy. The left kidney is unremarkable. Excreted contrast noted in the left renal collecting system. The urinary  bladder is minimally distended and contains excreted contrast. Stomach/Bowel: There is no bowel obstruction or active inflammation. The appendix is normal. Vascular/Lymphatic: The abdominal aorta and IVC are grossly unremarkable on this noncontrast CT. No portal venous gas. There is no adenopathy Reproductive: The prostate and seminal vesicles are grossly unremarkable. Other: Apparent hernia repair mesh within the pelvis. There is a 5.6 x 3.6 x 5.5 cm loculated collection along the mesh with surrounding stranding concerning for an infected collection or abscess. There is a small fat containing right inguinal hernia. Musculoskeletal: Degenerative changes of the spine. No acute osseous pathology. IMPRESSION: 1. No acute intrathoracic pathology. 2. A 5.6 x 3.6 x 5.5 cm loculated collection along the hernia repair mesh within the pelvis concerning for an infected collection or abscess. 3. Fatty liver. 4. Status post prior right nephrectomy. Electronically Signed: By: Anner Crete M.D. On: 04/05/2021 23:07    ____________________________________________   PROCEDURES  Procedures   Medications  iohexol (OMNIPAQUE) 350 MG/ML injection 125 mL (125 mLs Intravenous Contrast Given 04/05/21 2212)    Critical Care performed: No  ____________________________________________   INITIAL IMPRESSION / ASSESSMENT AND PLAN / ED COURSE  Pertinent labs & imaging results that were available during my care of the patient were reviewed by me and considered in my medical decision making (see chart for details).       Anthony Fuller is a 60 y.o. male, history of hypertension, diabetes, presents to the emergency department for evaluation of hypertension and chest pain.  Additionally endorses headache, fatigue, and sense of impending death.  History significant for blood pressure of 115 systolic yesterday, as well as acute onset of scrotal and left leg swelling.  Chart review shows that the patient was recently seen by  his family physician who diagnosed him with cellulitis on the scrotum and treated with doxycycline.  Physical exam significant for diffuse erythema across his body, chest wall tenderness, and 2+ pitting edema in the left lower extremity.  Initial work-up is unremarkable.  Chest x-ray shows no signs of cardiopulmonary disease.  No leukocytosis noted on CBC.  BMP unremarkable.  EKG shows sinus rhythm with no ST segment changes.  Will add BNP, UA, hepatic function panel, and bilateral venous ultrasound to evaluate for DVT's. If negative, will perform CT angio to rule out aortic dissection.   Differentials included, but not limited to: Serious: aortic dissection, ACS, pulmonary embolism, cardiac tamponade, Boerhaave syndrome, pneumothorax, myocarditis, pericarditis, acute chest syndrome, aortic stenosis. Common/Non-emergent: gastritis, esophagitis, costochondritis, pneumonia, anxiety  Clinical Course as of 04/05/21 2358  Wed Apr 05, 2021  1924 Troponin I (High Sensitivity): 7 [BS]  2221 Ultrasound negative for evidence of DVTs bilaterally. [BS]  2224 Radiology called stating the patient experienced IV extravasation of contrast halfway through the exam.  Patient states that he does not want to finish the exam with any more contrast due to concern for his kidneys.  We will complete the abdominal/pelvic CT without contrast. [BS]  2250 Examined the patient's right arm at the site of extravasation.  Notable swelling, but no tenderness or erythema.  Pulse, motor, sensation intact distally.  Provided the patient with warm compress. [BS]  2257 Respiratory panel negative for COVID or influenza.  Unremarkable hepatic function panel.,  Remarkable lipase.  BNP negative.  [BS]  2307 UA positive for proteinuria and bacteria present.  Will order urine culture. [BS]  2315 CT chest, abdomen, pelvis unremarkable for dissection. Radiologist incidentally found loculated collection along hernia repair mesh concerning for  infected collection vs abscess. Spoke with Dr. Dahlia Byes with general surgery who reviewed the images and states that he believes the collection is likely a benign seroma and that the patient is safe for discharge with outpatient follow up. After further discussion with radiology, agreed that this could be a seroma and they will addend their note.  [BS]    Clinical Course User Index [BS] Teodoro Spray, PA   Upon reexamination, patient appears well.  He is not actively in any pain at this time.  Work-up thus far has been negative for evidence of ACS, dissection, or acute abdominal pathology.  No evidence of DVT's with little suspicion for PE.  UA positive for bacteria and proteinuria, though specific gravity > 1.025 lowers diagnostic significance of protein.  Urine culture pending. I suspect that the patient's edema and transient symptoms may be related to his uncontrolled hypertension at home.  His blood pressure has been slightly elevated at 155/70 for the past few hours.  No further treatment or testing indicated at this time.  We will plan to discharge the patient with close primary care follow-up for HTN management and strict return precautions.   ____________________________________________   FINAL CLINICAL IMPRESSION(S) / ED DIAGNOSES  Final diagnoses:  Chest pain, unspecified type  Hypertension, unspecified type     NEW MEDICATIONS STARTED DURING THIS VISIT:  ED Discharge Orders     None        Note:  This document was prepared using Dragon voice recognition software and may include unintentional dictation errors.    Teodoro Spray, [place] 04/05/21 [phone]

## 2021-04-06 NOTE — ED Provider Notes (Signed)
12:00 AM  Saw patient along with Mardee Postin, PA.  Patient is a 60 year old male with history of hypertension, diabetes, obesity who presented to the emergency department for chest pain and hypertension.  EKG nonischemic.  Troponin x2 negative.  Blood pressure currently 150s/80s.  Currently asymptomatic.  CTA of the chest, abdomen pelvis was obtained to evaluate for any aortic pathology.  CT scan shows a fluid collection along the mesh of a recent hernia repair.  Patient had bilateral inguinal hernia repairs by Dr. Hampton Abbot on 02/27/2021.  On my exam, patient denies any abdominal pain.  States he has been sore since his surgery but this is improving.  Denies fevers, chills, nausea, vomiting.  States he is having normal bowel movements.  He is afebrile here.  No leukocytosis.  His abdominal exam is benign.  Discussed CT findings with Dr. Coralie Common on-call for general surgery.  He has reviewed patient's imaging and feels this is more likely a seroma and recommends follow-up with [facility] as an outpatient.  Given patient has a benign abdominal exam, no complaints of any abdominal symptoms and is otherwise well-appearing, asymptomatic at this time I feel that this is a reasonable plan and patient also in agreement.  We discussed at length return precautions.  He verbalized understanding and is comfortable with this plan.   Lakin Rhine, Delice Bison, DO 04/06/21 0009

## 2021-04-07 LAB — URINE CULTURE: Culture: NO GROWTH

## 2021-04-13 ENCOUNTER — Other Ambulatory Visit: Payer: Self-pay | Admitting: General Surgery

## 2021-04-13 DIAGNOSIS — R188 Other ascites: Secondary | ICD-10-CM

## 2021-04-13 DIAGNOSIS — R1030 Lower abdominal pain, unspecified: Secondary | ICD-10-CM

## 2021-04-14 ENCOUNTER — Encounter: Payer: Self-pay | Admitting: Urology

## 2021-04-14 ENCOUNTER — Ambulatory Visit: Payer: Medicare HMO | Admitting: Urology

## 2021-04-14 ENCOUNTER — Other Ambulatory Visit: Payer: Self-pay

## 2021-04-14 VITALS — BP 179/96 | HR 83 | Ht 74.0 in | Wt 302.0 lb

## 2021-04-14 DIAGNOSIS — N5089 Other specified disorders of the male genital organs: Secondary | ICD-10-CM | POA: Diagnosis not present

## 2021-04-14 DIAGNOSIS — R399 Unspecified symptoms and signs involving the genitourinary system: Secondary | ICD-10-CM

## 2021-04-14 DIAGNOSIS — N5201 Erectile dysfunction due to arterial insufficiency: Secondary | ICD-10-CM | POA: Diagnosis not present

## 2021-04-14 LAB — URINALYSIS, COMPLETE
Bilirubin, UA: NEGATIVE
Leukocytes,UA: NEGATIVE
Nitrite, UA: NEGATIVE
Specific Gravity, UA: 1.02 (ref 1.005–1.030)
Urobilinogen, Ur: 0.2 mg/dL (ref 0.2–1.0)
pH, UA: 5.5 (ref 5.0–7.5)

## 2021-04-14 LAB — MICROSCOPIC EXAMINATION
Bacteria, UA: NONE SEEN
Epithelial Cells (non renal): NONE SEEN /hpf (ref 0–10)
RBC, Urine: NONE SEEN /hpf (ref 0–2)

## 2021-04-14 MED ORDER — TADALAFIL 20 MG PO TABS: ORAL_TABLET | ORAL | 0 refills | Status: DC

## 2021-04-14 NOTE — Progress Notes (Signed)
04/14/2021 8:50 AM   Anthony Fuller 1960-07-30 [id number]  Referring provider: Herbert Pun, [address] [place],  [place] [address]  Chief Complaint  Patient presents with   Groin Swelling    HPI: Anthony Fuller is a 60 y.o. male who presents for evaluation of scrotal swelling.  Bilateral inguinal hernia repair by Dr. Windell Moment 02/27/2021 At follow-up visit 04/04/2021 had a 1 day history of penile and scrotal swelling.  Had been started on doxycycline by his PCP for cellulitis Also complained of dysuria History of right nephrectomy 2012 for renal cell carcinoma and stone disease status post ureteroscopy 2017 Exam was normal at the time of his surgery follow-up but was referred for dysuria CT 04/05/2021 in the ED showed no urinary tract calculi or hydronephrosis States his swelling occurred 4-5 weeks after surgery and resolved in 2-3 days.  He also had lower extremity edema Dysuria has resolved.  He has baseline urinary hesitancy and postvoid dribbling.  Has taken tamsulosin in the past without any improvement in his symptoms.  CT scan showed no bladder distention Also complains of 10+ year history of ED Organic risk factors include degenerative joint disease, diabetes, hypertension Has tried sildenafil in the past without improvement Has minimal erectile activity   PMH: Past Medical History:  Diagnosis Date   Acid indigestion 02/14/2012   Anxiety    Arthritis    BIL.KNEES   Bulge of lumbar disc without myelopathy 09/05/2011   Cancer ([place]) 2012   R kidney removed   Cervical stenosis of spine    Cervicalgia    Chest wall pain    Clostridium difficile infection 02/04/2014   DDD (degenerative disc disease), lumbar    DDD (degenerative disc disease), lumbar    Degeneration of intervertebral disc of lumbar region 08/23/2011   Diabetes mellitus ([place]) 07/24/2011   Diabetes mellitus without complication ([place])    Diabetic polyneuropathy associated with type  2 diabetes mellitus ([place])    Diarrhea    DM (diabetes mellitus) type 2, uncontrolled, with ketoacidosis ([place]) 05/30/2015   Dyspepsia    Enthesopathy of hip 04/11/2015   Fatty liver disease, nonalcoholic 10/15/4032   GERD (gastroesophageal reflux disease)    Headache    Hepatic cirrhosis ([facility]) 09/12/2014   Hydrocele    Hydrocele    Hypertension    IBS (irritable bowel syndrome)    Lumbar herniated disc    Lumbar radiculopathy 09/05/2011   Lumbar stenosis    Peroneal tendon injury, right, initial encounter    Peroneal tendon tear    Radiculopathy    Renal insufficiency    Right Nephrectomy due to [place].   Sleep apnea    snores   Stenosis of cervical spine    Thoracic and lumbosacral neuritis 07/24/2011   Thrombocytopathia ([place])    Thrombocytopenia ([place])     Surgical History: Past Surgical History:  Procedure Laterality Date   BACK SURGERY     cervical fusion C5-7   CARDIAC CATHETERIZATION Right 06/20/2015   Procedure: Left Heart Cath and Coronary Angiography;  Surgeon: Dionisio David, MD;  Location: [facility];  Service: Cardiovascular;  Laterality: Right;   COLONOSCOPY     CYSTOSCOPY W/ RETROGRADES Left 05/18/2015   Procedure: CYSTOSCOPY WITH RETROGRADE PYELOGRAM;  Surgeon: Nickie Retort, MD;  Location: [facility];  Service: Urology;  Laterality: Left;   CYSTOSCOPY WITH STENT PLACEMENT Left 05/18/2015   Procedure: CYSTOSCOPY WITH STENT PLACEMENT;  Surgeon: Nickie Retort, MD;  Location: [facility];  Service: Urology;  Laterality: Left;   CYSTOSCOPY WITH STENT PLACEMENT Left 06/28/2015   Procedure: CYSTOSCOPY WITH STENT PLACEMENT/ EXCHANGE;  Surgeon: Hollice Espy, MD;  Location: [facility];  Service: Urology;  Laterality: Left;   ESOPHAGOGASTRODUODENOSCOPY (EGD) WITH PROPOFOL N/A 09/03/2016   Procedure: ESOPHAGOGASTRODUODENOSCOPY (EGD) WITH PROPOFOL;  Surgeon: Manya Silvas, MD;  Location: [facility];  Service: Endoscopy;  Laterality: N/A;   ESOPHAGOGASTRODUODENOSCOPY  ENDOSCOPY N/A 03/14/2018   Procedure: ESOPHAGOGASTRODUODENOSCOPY ENDOSCOPY;  Surgeon: Manya Silvas, MD;  Location: [facility] END[facility]COPY;  Service: Endoscopy;  Laterality: N/A;   HALLUX VALGUS BASE WEDGE Right 08/23/2017   Procedure: HALLUX VALGUS BASE WEDGE;  Surgeon: Samara Deist, DPM;  Location: [facility];  Service: Podiatry;  Laterality: Right;   INSERTION OF MESH  02/27/2021   Procedure: INSERTION OF MESH;  Surgeon: Herbert Pun, MD;  Location: [facility];  Service: General;;   JOINT REPLACEMENT     right knee    kidney removed Right    2012   NAFLD     NEPHRECTOMY Right 04/2011   ORIF TOE FRACTURE Right 08/23/2017   Procedure: OPEN REDUCTION INTERNAL FIXATION (ORIF) METATARSAL (TOE) FRACTURE;  Surgeon: Samara Deist, DPM;  Location: [facility];  Service: Podiatry;  Laterality: Right;   SPINE SURGERY     lumbar 2013   URETEROSCOPY WITH HOLMIUM LASER LITHOTRIPSY Left 05/18/2015   Procedure: URETEROSCOPY WITH HOLMIUM LASER LITHOTRIPSY;  Surgeon: Nickie Retort, MD;  Location: [facility];  Service: Urology;  Laterality: Left;   URETEROSCOPY WITH HOLMIUM LASER LITHOTRIPSY Left 06/28/2015   Procedure: URETEROSCOPY WITH HOLMIUM LASER LITHOTRIPSY;  Surgeon: Hollice Espy, MD;  Location: [facility];  Service: Urology;  Laterality: Left;    Home Medications:  Allergies as of 04/14/2021       Reactions   Hydromorphone Other (See Comments)   Severe hypotension   Aspirin    Hx GI bleed   Nsaids    Hx of GI bleed   Floxin [ofloxacin] Rash        Medication List        Accurate as of April 14, 2021  8:50 AM. If you have any questions, ask your nurse or doctor.          cetirizine 10 MG tablet Commonly known as: ZYRTEC Take 10 mg by mouth daily as needed for allergies.   clopidogrel 75 MG tablet Commonly known as: PLAVIX Take 75 mg by mouth daily.   gabapentin 400 MG capsule Commonly known as: NEURONTIN Take 400 mg by mouth 3 (three) times daily.   insulin  NPH-regular Human (70-30) 100 UNIT/ML injection Inject 55-60 Units into the skin See admin instructions. Inject 60 units in the morning and 55 units at night   lidocaine 5 % ointment Commonly known as: XYLOCAINE Apply 1 application topically 2 (two) times daily.   metFORMIN 500 MG 24 hr tablet Commonly known as: GLUCOPHAGE-XR Take 500-1,000 mg by mouth See admin instructions. Take 1000 mg in the morning and 500 mg in the evening   multivitamin with minerals Tabs tablet Take 1 tablet by mouth daily.   omeprazole 40 MG capsule Commonly known as: PRILOSEC Take 40 mg by mouth daily.   tadalafil 20 MG tablet Commonly known as: CIALIS Take 1 tablet 1 hour prior to intercourse. Started by: Abbie Sons, MD   tiZANidine 4 MG capsule Commonly known as: ZANAFLEX Take 4 mg by mouth 3 (three) times daily.   traMADol 50 MG tablet Commonly known as: ULTRAM Take 50  mg by mouth every 6 (six) hours as needed for moderate pain. for pain   traMADol 50 MG tablet Commonly known as: Ultram Take 1 tablet (50 mg total) by mouth every 6 (six) hours as needed.        Allergies:  Allergies  Allergen Reactions   Hydromorphone Other (See Comments)    Severe hypotension    Aspirin     Hx GI bleed   Nsaids     Hx of GI bleed   Floxin [Ofloxacin] Rash    Family History: Family History  Problem Relation Age of Onset   Diabetes Mother    Hypertension Mother    Asthma Mother    Heart disease Father    Kidney disease Father    Diabetes Father    Stroke Father     Social History:  reports that he has never smoked. He has never used smokeless tobacco. He reports that he does not drink alcohol and does not use drugs.   Physical Exam: BP (!) 179/96   Pulse 83   Ht 6\' 2"  (1.88 m)   Wt (!) 302 lb (137 kg)   BMI 38.77 kg/m   Constitutional:  Alert and oriented, No acute distress. HEENT: [place] AT, moist mucus membranes.  Trachea midline, no masses. Cardiovascular: No clubbing,  cyanosis, or edema. Respiratory: Normal respiratory effort, no increased work of breathing. GI: Abdomen is soft, nontender, nondistended, no abdominal masses GU: Penis retracted.  Testes descended bilaterally without masses or tenderness.  No scrotal swelling. Skin: No rashes, bruises or suspicious lesions. Neurologic: Grossly intact, no focal deficits, moving all 4 extremities. Psychiatric: Normal mood and affect.    Assessment & Plan:    1. Scrotal swelling Resolved Most likely secondary to scrotal edema  2.  Lower urinary tract symptoms Voiding symptoms not bothersome and he declined additional therapy Urinalysis today was unremarkable  3.  Erectile dysfunction Significant organic risk factors Sildenafil was not effective and based on his minimal erectile activity unlikely tadalafil will be effective however he did desire a trial Second line options were discussed including intracavernosal injections however he is not interested in pursuing   Abbie Sons, MD  [facility] [address], [place], [place] [address] [phone]

## 2021-04-16 ENCOUNTER — Encounter: Payer: Self-pay | Admitting: Urology

## 2021-04-18 ENCOUNTER — Other Ambulatory Visit: Payer: Self-pay

## 2021-04-18 ENCOUNTER — Ambulatory Visit
Admission: RE | Admit: 2021-04-18 | Discharge: 2021-04-18 | Disposition: A | Discharge status: Home / Self Care | Payer: Medicare HMO | Source: Ambulatory Visit | Attending: Neurology | Admitting: Neurology

## 2021-04-18 DIAGNOSIS — R29818 Other symptoms and signs involving the nervous system: Secondary | ICD-10-CM | POA: Diagnosis present

## 2021-04-18 MED ORDER — GADOBUTROL 1 MMOL/ML IV SOLN
10.0000 mL | Freq: Once | INTRAVENOUS | Status: AC | PRN
  Administered 2021-04-18: 10 mL via INTRAVENOUS

## 2021-04-19 ENCOUNTER — Other Ambulatory Visit (HOSPITAL_COMMUNITY): Payer: Self-pay | Admitting: Neurology

## 2021-04-19 ENCOUNTER — Other Ambulatory Visit: Payer: Self-pay | Admitting: Neurology

## 2021-04-19 DIAGNOSIS — R29818 Other symptoms and signs involving the nervous system: Secondary | ICD-10-CM

## 2021-04-24 ENCOUNTER — Encounter (INDEPENDENT_AMBULATORY_CARE_PROVIDER_SITE_OTHER): Payer: Medicare HMO

## 2021-04-24 ENCOUNTER — Ambulatory Visit (INDEPENDENT_AMBULATORY_CARE_PROVIDER_SITE_OTHER): Payer: Medicare HMO | Admitting: Nurse Practitioner

## 2021-05-02 ENCOUNTER — Ambulatory Visit: Payer: Medicare HMO

## 2021-05-10 ENCOUNTER — Ambulatory Visit
Admission: RE | Admit: 2021-05-10 | Discharge: 2021-05-10 | Disposition: A | Discharge status: Home / Self Care | Payer: Medicare HMO | Source: Ambulatory Visit | Attending: General Surgery | Admitting: General Surgery

## 2021-05-10 ENCOUNTER — Other Ambulatory Visit: Payer: Self-pay

## 2021-05-10 DIAGNOSIS — R1030 Lower abdominal pain, unspecified: Secondary | ICD-10-CM | POA: Insufficient documentation

## 2021-05-10 DIAGNOSIS — R188 Other ascites: Secondary | ICD-10-CM | POA: Diagnosis not present

## 2021-05-10 LAB — POCT I-STAT CREATININE: Creatinine, Ser: 1.9 mg/dL — ABNORMAL HIGH (ref 0.61–1.24)

## 2021-05-10 MED ORDER — IOHEXOL 350 MG/ML SOLN
60.0000 mL | Freq: Once | INTRAVENOUS | Status: AC | PRN
  Administered 2021-05-10: 11:00:00 60 mL via INTRAVENOUS

## 2021-11-22 ENCOUNTER — Other Ambulatory Visit: Payer: Self-pay | Admitting: Family Medicine

## 2021-11-22 DIAGNOSIS — I8312 Varicose veins of left lower extremity with inflammation: Secondary | ICD-10-CM

## 2021-11-29 ENCOUNTER — Ambulatory Visit
Admission: RE | Admit: 2021-11-29 | Discharge: 2021-11-29 | Disposition: A | Discharge status: Home / Self Care | Payer: Medicare HMO | Source: Ambulatory Visit | Attending: Family Medicine | Admitting: Family Medicine

## 2021-11-29 DIAGNOSIS — I8312 Varicose veins of left lower extremity with inflammation: Secondary | ICD-10-CM | POA: Diagnosis present

## 2021-12-11 ENCOUNTER — Inpatient Hospital Stay
Admission: EM | Admit: 2021-12-11 | Discharge: 2021-12-14 | DRG: 373 | Disposition: A | Discharge status: Home / Self Care | Payer: Medicare HMO | Attending: Internal Medicine | Admitting: Internal Medicine

## 2021-12-11 ENCOUNTER — Other Ambulatory Visit: Payer: Self-pay

## 2021-12-11 ENCOUNTER — Emergency Department: Payer: Medicare HMO

## 2021-12-11 DIAGNOSIS — G4733 Obstructive sleep apnea (adult) (pediatric): Secondary | ICD-10-CM | POA: Diagnosis present

## 2021-12-11 DIAGNOSIS — Z6838 Body mass index (BMI) 38.0-38.9, adult: Secondary | ICD-10-CM

## 2021-12-11 DIAGNOSIS — R1011 Right upper quadrant pain: Secondary | ICD-10-CM | POA: Diagnosis not present

## 2021-12-11 DIAGNOSIS — E669 Obesity, unspecified: Secondary | ICD-10-CM | POA: Diagnosis present

## 2021-12-11 DIAGNOSIS — Z888 Allergy status to other drugs, medicaments and biological substances status: Secondary | ICD-10-CM

## 2021-12-11 DIAGNOSIS — Z8619 Personal history of other infectious and parasitic diseases: Secondary | ICD-10-CM

## 2021-12-11 DIAGNOSIS — A045 Campylobacter enteritis: Secondary | ICD-10-CM | POA: Diagnosis not present

## 2021-12-11 DIAGNOSIS — K7460 Unspecified cirrhosis of liver: Secondary | ICD-10-CM | POA: Diagnosis present

## 2021-12-11 DIAGNOSIS — Z20822 Contact with and (suspected) exposure to covid-19: Secondary | ICD-10-CM | POA: Diagnosis present

## 2021-12-11 DIAGNOSIS — Z825 Family history of asthma and other chronic lower respiratory diseases: Secondary | ICD-10-CM

## 2021-12-11 DIAGNOSIS — R197 Diarrhea, unspecified: Secondary | ICD-10-CM

## 2021-12-11 DIAGNOSIS — E1142 Type 2 diabetes mellitus with diabetic polyneuropathy: Secondary | ICD-10-CM

## 2021-12-11 DIAGNOSIS — N200 Calculus of kidney: Secondary | ICD-10-CM | POA: Diagnosis present

## 2021-12-11 DIAGNOSIS — Z905 Acquired absence of kidney: Secondary | ICD-10-CM

## 2021-12-11 DIAGNOSIS — I10 Essential (primary) hypertension: Secondary | ICD-10-CM

## 2021-12-11 DIAGNOSIS — Z885 Allergy status to narcotic agent status: Secondary | ICD-10-CM

## 2021-12-11 DIAGNOSIS — K760 Fatty (change of) liver, not elsewhere classified: Secondary | ICD-10-CM | POA: Diagnosis present

## 2021-12-11 DIAGNOSIS — Z7984 Long term (current) use of oral hypoglycemic drugs: Secondary | ICD-10-CM

## 2021-12-11 DIAGNOSIS — K219 Gastro-esophageal reflux disease without esophagitis: Secondary | ICD-10-CM | POA: Diagnosis present

## 2021-12-11 DIAGNOSIS — Z841 Family history of disorders of kidney and ureter: Secondary | ICD-10-CM

## 2021-12-11 DIAGNOSIS — K529 Noninfective gastroenteritis and colitis, unspecified: Secondary | ICD-10-CM | POA: Diagnosis not present

## 2021-12-11 DIAGNOSIS — K828 Other specified diseases of gallbladder: Secondary | ICD-10-CM

## 2021-12-11 DIAGNOSIS — Z794 Long term (current) use of insulin: Secondary | ICD-10-CM

## 2021-12-11 DIAGNOSIS — E1165 Type 2 diabetes mellitus with hyperglycemia: Secondary | ICD-10-CM | POA: Diagnosis present

## 2021-12-11 DIAGNOSIS — Z886 Allergy status to analgesic agent status: Secondary | ICD-10-CM

## 2021-12-11 DIAGNOSIS — Z8249 Family history of ischemic heart disease and other diseases of the circulatory system: Secondary | ICD-10-CM

## 2021-12-11 DIAGNOSIS — E876 Hypokalemia: Secondary | ICD-10-CM | POA: Diagnosis present

## 2021-12-11 DIAGNOSIS — Z85528 Personal history of other malignant neoplasm of kidney: Secondary | ICD-10-CM

## 2021-12-11 DIAGNOSIS — Z7902 Long term (current) use of antithrombotics/antiplatelets: Secondary | ICD-10-CM

## 2021-12-11 DIAGNOSIS — Z823 Family history of stroke: Secondary | ICD-10-CM

## 2021-12-11 DIAGNOSIS — Z79899 Other long term (current) drug therapy: Secondary | ICD-10-CM

## 2021-12-11 DIAGNOSIS — Z833 Family history of diabetes mellitus: Secondary | ICD-10-CM

## 2021-12-11 DIAGNOSIS — R1084 Generalized abdominal pain: Principal | ICD-10-CM

## 2021-12-11 LAB — CBC WITH DIFFERENTIAL/PLATELET
Abs Immature Granulocytes: 0.17 10*3/uL — ABNORMAL HIGH (ref 0.00–0.07)
Basophils Absolute: 0.1 10*3/uL (ref 0.0–0.1)
Basophils Relative: 1 %
Eosinophils Absolute: 0 10*3/uL (ref 0.0–0.5)
Eosinophils Relative: 0 %
HCT: 43.7 % (ref 39.0–52.0)
Hemoglobin: 14.6 g/dL (ref 13.0–17.0)
Immature Granulocytes: 1 %
Lymphocytes Relative: 14 %
Lymphs Abs: 1.7 10*3/uL (ref 0.7–4.0)
MCH: 28.7 pg (ref 26.0–34.0)
MCHC: 33.4 g/dL (ref 30.0–36.0)
MCV: 85.9 fL (ref 80.0–100.0)
Monocytes Absolute: 0.9 10*3/uL (ref 0.1–1.0)
Monocytes Relative: 7 %
Neutro Abs: 9.4 10*3/uL — ABNORMAL HIGH (ref 1.7–7.7)
Neutrophils Relative %: 77 %
Platelets: 307 10*3/uL (ref 150–400)
RBC: 5.09 MIL/uL (ref 4.22–5.81)
RDW: 12.9 % (ref 11.5–15.5)
WBC: 12.2 10*3/uL — ABNORMAL HIGH (ref 4.0–10.5)
nRBC: 0 % (ref 0.0–0.2)

## 2021-12-11 LAB — COMPREHENSIVE METABOLIC PANEL
ALT: 26 U/L (ref 0–44)
AST: 27 U/L (ref 15–41)
Albumin: 3.7 g/dL (ref 3.5–5.0)
Alkaline Phosphatase: 66 U/L (ref 38–126)
Anion gap: 10 (ref 5–15)
BUN: 15 mg/dL (ref 6–20)
CO2: 20 mmol/L — ABNORMAL LOW (ref 22–32)
Calcium: 8.9 mg/dL (ref 8.9–10.3)
Chloride: 108 mmol/L (ref 98–111)
Creatinine, Ser: 1.33 mg/dL — ABNORMAL HIGH (ref 0.61–1.24)
GFR, Estimated: >60 mL/min (ref >60)
Glucose, Bld: 172 mg/dL — ABNORMAL HIGH (ref 70–99)
Potassium: 4.4 mmol/L (ref 3.5–5.1)
Sodium: 138 mmol/L (ref 135–145)
Total Bilirubin: 1.5 mg/dL — ABNORMAL HIGH (ref 0.3–1.2)
Total Protein: 8.3 g/dL — ABNORMAL HIGH (ref 6.5–8.1)

## 2021-12-11 LAB — SARS CORONAVIRUS 2 BY RT PCR: SARS Coronavirus 2 by RT PCR: NEGATIVE

## 2021-12-11 LAB — LIPASE, BLOOD: Lipase: 36 U/L (ref 11–51)

## 2021-12-11 LAB — TROPONIN I (HIGH SENSITIVITY): Troponin I (High Sensitivity): 10 ng/L (ref <18)

## 2021-12-11 MED ORDER — INSULIN ASPART PROT & ASPART (70-30 MIX) 100 UNIT/ML ~~LOC~~ SUSP
60.0000 [IU] | Freq: Every day | SUBCUTANEOUS | Status: DC
Start: 2021-12-12 — End: 2021-12-12
  Filled 2021-12-11: qty 10

## 2021-12-11 MED ORDER — AMLODIPINE BESYLATE 5 MG PO TABS
5.0000 mg | ORAL_TABLET | Freq: Two times a day (BID) | ORAL | Status: DC
  Administered 2021-12-12 – 2021-12-13 (×4): 5 mg via ORAL
  Filled 2021-12-11 (×4): qty 1

## 2021-12-11 MED ORDER — SODIUM CHLORIDE 0.9 % IV SOLN: INTRAVENOUS | Status: DC

## 2021-12-11 MED ORDER — TRAMADOL HCL 50 MG PO TABS
50.0000 mg | ORAL_TABLET | Freq: Four times a day (QID) | ORAL | Status: DC | PRN
  Administered 2021-12-13 – 2021-12-14 (×5): 50 mg via ORAL
  Filled 2021-12-11 (×5): qty 1

## 2021-12-11 MED ORDER — INSULIN ASPART PROT & ASPART (70-30 MIX) 100 UNIT/ML ~~LOC~~ SUSP
55.0000 [IU] | Freq: Every day | SUBCUTANEOUS | Status: DC
  Filled 2021-12-11: qty 10

## 2021-12-11 MED ORDER — IOHEXOL 300 MG/ML  SOLN
100.0000 mL | Freq: Once | INTRAMUSCULAR | Status: AC | PRN
  Administered 2021-12-11: 100 mL via INTRAVENOUS

## 2021-12-11 MED ORDER — ALBUTEROL SULFATE (2.5 MG/3ML) 0.083% IN NEBU: 2.5000 mg | INHALATION_SOLUTION | RESPIRATORY_TRACT | Status: DC | PRN

## 2021-12-11 MED ORDER — ACETAMINOPHEN 325 MG PO TABS
650.0000 mg | ORAL_TABLET | Freq: Four times a day (QID) | ORAL | Status: DC | PRN
  Administered 2021-12-13 – 2021-12-14 (×2): 650 mg via ORAL
  Filled 2021-12-11 (×2): qty 2

## 2021-12-11 MED ORDER — PANTOPRAZOLE SODIUM 40 MG PO TBEC
40.0000 mg | DELAYED_RELEASE_TABLET | Freq: Every day | ORAL | Status: DC
  Administered 2021-12-12 – 2021-12-14 (×3): 40 mg via ORAL
  Filled 2021-12-11 (×3): qty 1

## 2021-12-11 MED ORDER — TIZANIDINE HCL 2 MG PO TABS
4.0000 mg | ORAL_TABLET | Freq: Three times a day (TID) | ORAL | Status: DC
  Administered 2021-12-12 – 2021-12-14 (×9): 4 mg via ORAL
  Filled 2021-12-11 (×10): qty 2

## 2021-12-11 MED ORDER — CLOPIDOGREL BISULFATE 75 MG PO TABS: 75.0000 mg | ORAL_TABLET | Freq: Every day | ORAL | Status: DC

## 2021-12-11 MED ORDER — ONDANSETRON HCL 4 MG/2ML IJ SOLN
4.0000 mg | Freq: Once | INTRAMUSCULAR | Status: AC
  Administered 2021-12-11: 4 mg via INTRAVENOUS
  Filled 2021-12-11: qty 2

## 2021-12-11 MED ORDER — METOPROLOL SUCCINATE ER 50 MG PO TB24
25.0000 mg | ORAL_TABLET | Freq: Every day | ORAL | Status: DC
  Administered 2021-12-12 – 2021-12-13 (×2): 25 mg via ORAL
  Filled 2021-12-11 (×3): qty 1

## 2021-12-11 MED ORDER — ADULT MULTIVITAMIN W/MINERALS CH
1.0000 | ORAL_TABLET | Freq: Every day | ORAL | Status: DC
  Administered 2021-12-12 – 2021-12-14 (×3): 1 via ORAL
  Filled 2021-12-11 (×3): qty 1

## 2021-12-11 MED ORDER — ACETAMINOPHEN 650 MG RE SUPP: 650.0000 mg | Freq: Four times a day (QID) | RECTAL | Status: DC | PRN

## 2021-12-11 MED ORDER — TRAZODONE HCL 50 MG PO TABS: 25.0000 mg | ORAL_TABLET | Freq: Every evening | ORAL | Status: DC | PRN

## 2021-12-11 MED ORDER — GABAPENTIN 300 MG PO CAPS
400.0000 mg | ORAL_CAPSULE | Freq: Three times a day (TID) | ORAL | Status: DC
  Administered 2021-12-12 – 2021-12-14 (×9): 400 mg via ORAL
  Filled 2021-12-11 (×9): qty 1

## 2021-12-11 MED ORDER — ENOXAPARIN SODIUM 40 MG/0.4ML IJ SOSY: 40.0000 mg | PREFILLED_SYRINGE | INTRAMUSCULAR | Status: DC

## 2021-12-11 MED ORDER — ROSUVASTATIN CALCIUM 5 MG PO TABS
5.0000 mg | ORAL_TABLET | Freq: Every day | ORAL | Status: DC
  Administered 2021-12-12 – 2021-12-14 (×3): 5 mg via ORAL
  Filled 2021-12-11 (×3): qty 1

## 2021-12-11 MED ORDER — ONDANSETRON HCL 4 MG PO TABS
4.0000 mg | ORAL_TABLET | Freq: Four times a day (QID) | ORAL | Status: DC | PRN
  Filled 2021-12-11: qty 1

## 2021-12-11 MED ORDER — LORATADINE 10 MG PO TABS: 10.0000 mg | ORAL_TABLET | Freq: Every day | ORAL | Status: DC | PRN

## 2021-12-11 MED ORDER — ONDANSETRON HCL 4 MG/2ML IJ SOLN
4.0000 mg | Freq: Four times a day (QID) | INTRAMUSCULAR | Status: DC | PRN
  Administered 2021-12-13 (×3): 4 mg via INTRAVENOUS
  Filled 2021-12-11 (×3): qty 2

## 2021-12-11 MED ORDER — FENTANYL CITRATE PF 50 MCG/ML IJ SOSY
50.0000 ug | PREFILLED_SYRINGE | Freq: Once | INTRAMUSCULAR | Status: AC
  Administered 2021-12-11: 50 ug via INTRAVENOUS
  Filled 2021-12-11: qty 1

## 2021-12-11 MED ORDER — LIDOCAINE 5 % EX OINT
1.0000 | TOPICAL_OINTMENT | Freq: Two times a day (BID) | CUTANEOUS | Status: DC
  Filled 2021-12-11 (×2): qty 35.44

## 2021-12-11 NOTE — ED Provider Triage Note (Signed)
  Emergency Medicine Provider Triage Evaluation Note  Anthony Fuller , a 61 y.o.male,  was evaluated in triage.  Pt complains of diffuse abdominal pain and persistent diarrhea x9 days.  He states that he has had a stool sample taken, but this came back negative.  Reports feeling feverish as well.   Review of Systems  Positive: Fever, abdominal pain. Negative: Denies shortness of breath, chest pain, vomiting  Physical Exam   Vitals:   12/11/21 1734  BP: (!) 162/94  Pulse: 90  Resp: 17  Temp: 100.1 F (37.8 C)  SpO2: 96%   Gen:   Awake, appears uncomfortable. Resp:  Normal effort  MSK:   Moves extremities without difficulty  Other:  Diffuse tenderness across his abdomen.  Medical Decision Making  Given the patient's initial medical screening exam, the following diagnostic evaluation has been ordered. The patient will be placed in the appropriate treatment space, once one is available, to complete the evaluation and treatment. I have discussed the plan of care with the patient and I have advised the patient that an ED physician or mid-level practitioner will reevaluate their condition after the test results have been received, as the results may give them additional insight into the type of treatment they may need.    Diagnostics: Labs, UA, EKG, abdominal CT  Treatments: none immediately   Teodoro Spray, [place] 12/11/21 1740

## 2021-12-11 NOTE — ED Provider Notes (Signed)
[facility] Provider Note    Event Date/Time   First MD Initiated Contact with Patient 12/11/21 2107     (approximate)   History   Abdominal Pain   HPI  Anthony Fuller is a 61 y.o. male with acid reflux, diabetes, prior C. difficile who comes in with concern for abdominal pain.  Patient reports 1 week of abdominal pain all over the abdomen.  Reporting some nausea and every time that he tries to eat that the diarrhea just comes right through.  Denies ever having this previously.   Physical Exam   Triage Vital Signs: ED Triage Vitals  Enc Vitals Group     BP 12/11/21 1734 (!) 162/94     Pulse Rate 12/11/21 1734 90     Resp 12/11/21 1734 17     Temp 12/11/21 1734 100.1 F (37.8 C)     Temp Source 12/11/21 1734 Oral     SpO2 12/11/21 1734 96 %     Weight 12/11/21 1735 (!) 302 lb 0.5 oz (137 kg)     Height 12/11/21 1735 '6\' 2"'$  (1.88 m)     Head Circumference --      Peak Flow --      Pain Score 12/11/21 1734 9     Pain Loc --      Pain Edu? --      Excl. in [place]? --     Most recent vital signs: Vitals:   12/11/21 1734 12/11/21 2007  BP: (!) 162/94 133/82  Pulse: 90 74  Resp: 17 18  Temp: 100.1 F (37.8 C) 99.6 F (37.6 C)  SpO2: 96% 93%     General: Awake, no distress.  CV:  Good peripheral perfusion.  Resp:  Normal effort.  Abd:  No distention.  Tender throughout the abdomen. Other:     ED Results / Procedures / Treatments   Labs (all labs ordered are listed, but only abnormal results are displayed) Labs Reviewed  COMPREHENSIVE METABOLIC PANEL - Abnormal; Notable for the following components:      Result Value   CO2 20 (*)    Glucose, Bld 172 (*)    Creatinine, Ser 1.33 (*)    Total Protein 8.3 (*)    Total Bilirubin 1.5 (*)    All other components within normal limits  CBC WITH DIFFERENTIAL/PLATELET - Abnormal; Notable for the following components:   WBC 12.2 (*)    Neutro Abs 9.4 (*)    Abs Immature Granulocytes 0.17  (*)    All other components within normal limits  SARS CORONAVIRUS 2 BY RT PCR  LIPASE, BLOOD  URINALYSIS, ROUTINE W REFLEX MICROSCOPIC  TROPONIN I (HIGH SENSITIVITY)     EKG  My interpretation of EKG:  Normal sinus rate of 89 without any ST elevation, T wave inversion in lead III, normal intervals  RADIOLOGY I have reviewed the x-ray personally and interpreted and no pneumonia  PROCEDURES:  Critical Care performed: No  .1-3 Lead EKG Interpretation  Performed by: Vanessa [place], MD Authorized by: Vanessa Hernandez, MD     Interpretation: normal     ECG rate:  70   ECG rate assessment: normal     Rhythm: sinus rhythm     Ectopy: none     Conduction: normal      MEDICATIONS ORDERED IN ED: Medications  traMADol (ULTRAM) tablet 50 mg (has no administration in time range)  amLODipine (NORVASC) tablet 5 mg (has no administration in  time range)  metoprolol succinate (TOPROL-XL) 24 hr tablet 25 mg (has no administration in time range)  rosuvastatin (CRESTOR) tablet 5 mg (has no administration in time range)  insulin aspart protamine- aspart (NOVOLOG MIX 70/30) injection 55 Units (has no administration in time range)  pantoprazole (PROTONIX) EC tablet 40 mg (has no administration in time range)  clopidogrel (PLAVIX) tablet 75 mg (has no administration in time range)  gabapentin (NEURONTIN) capsule 400 mg (has no administration in time range)  tiZANidine (ZANAFLEX) capsule 4 mg (has no administration in time range)  multivitamin with minerals tablet 1 tablet (has no administration in time range)  albuterol (VENTOLIN HFA) 108 (90 Base) MCG/ACT inhaler 2 puff (has no administration in time range)  loratadine (CLARITIN) tablet 10 mg (has no administration in time range)  lidocaine (XYLOCAINE) 5 % ointment 1 Application (has no administration in time range)  enoxaparin (LOVENOX) injection 40 mg (has no administration in time range)  0.9 %  sodium chloride infusion (has no  administration in time range)  acetaminophen (TYLENOL) tablet 650 mg (has no administration in time range)    Or  acetaminophen (TYLENOL) suppository 650 mg (has no administration in time range)  traZODone (DESYREL) tablet 25 mg (has no administration in time range)  ondansetron (ZOFRAN) tablet 4 mg (has no administration in time range)    Or  ondansetron (ZOFRAN) injection 4 mg (has no administration in time range)  iohexol (OMNIPAQUE) 300 MG/ML solution 100 mL (100 mLs Intravenous Contrast Given 12/11/21 1833)  fentaNYL (SUBLIMAZE) injection 50 mcg (50 mcg Intravenous Given 12/11/21 2217)  ondansetron (ZOFRAN) injection 4 mg (4 mg Intravenous Given 12/11/21 2217)     IMPRESSION / MDM / ASSESSMENT AND PLAN / ED COURSE  I reviewed the triage vital signs and the nursing notes.   Patient's presentation is most consistent with acute presentation with potential threat to life or bodily function.   Differential includes ACS, cholecystitis, obstruction, perforation, C. difficile, colitis.  CT imaging ordered from triage  Labs show slightly elevated creatinine but similar to prior.  COVID-negative.  White count is elevated.  Lipase normal troponin negative.  Patient does not meet sepsis criteria at this time holding off on antibiotics.  CT scan concerning for some hepatic steatosis but LFTs were normal possible cholecystitis however on the ultrasound it was difficult to tell if it was a cholecystitis or not.  Discussed with surgery Dr. Lysle Pearl who recommended admission to the hospitalist for HIDA scan, C. difficile testing etc.  Will order stool testing as well to make sure there is no other pathogens given recurrent diarrhea.  The patient is on the cardiac monitor to evaluate for evidence of arrhythmia and/or significant heart rate changes.      FINAL CLINICAL IMPRESSION(S) / ED DIAGNOSES   Final diagnoses:  Generalized abdominal pain  Diarrhea, unspecified type     Rx / DC Orders   ED  Discharge Orders     None        Note:  This document was prepared using Dragon voice recognition software and may include unintentional dictation errors.   Vanessa [place], MD 12/11/21 [phone]

## 2021-12-11 NOTE — ED Triage Notes (Signed)
Patient arrived by [facility] from home. C/o diarrhea X9 days. [facility] primary stool sample came back negative but reports still experiencing abdominal pain and diarrhea. Reports stool has sour smell.   87HR 95% Ra 140/73 b/p 98.4 oral temp 164 CBG

## 2021-12-11 NOTE — H&P (Signed)
Subjective:   CC: diarrhea and abdominal pain  HPI:  Anthony Fuller is a 61 y.o. male who is consulted by [facility] for evaluation of above cc.  Symptoms were first noted several days ago. Pain is diffuse, crampy, constant, worsening since noted at the same time as the onset of diarrhea.  Describes diarrhea as multiple times throughout the day only liquid initially started greenish color but now notes specks of black stooling similar to "cigarette ashes" .  Denies any sick contacts or recent travel around time of symptom onset.  Pain continued to worsen so decided to come into the ED.  Pain is not worse in one place or the other.  Stool studies negative at primary's office, but not tested for C. difficile.  No hematochezia.  Nauseous but no episodes of vomiting  Past Medical History:  has a past medical history of Acid indigestion (02/14/2012), Anxiety, Arthritis, Bulge of lumbar disc without myelopathy (09/05/2011), Cancer ([place]) (2012), Cervical stenosis of spine, Cervicalgia, Chest wall pain, Clostridium difficile infection (02/04/2014), DDD (degenerative disc disease), lumbar, DDD (degenerative disc disease), lumbar, Degeneration of intervertebral disc of lumbar region (08/23/2011), Diabetes mellitus ([place]) (07/24/2011), Diabetes mellitus without complication ([place]), Diabetic polyneuropathy associated with type 2 diabetes mellitus ([place]), Diarrhea, DM (diabetes mellitus) type 2, uncontrolled, with ketoacidosis ([place]) (05/30/2015), Dyspepsia, Enthesopathy of hip (04/11/2015), Fatty liver disease, nonalcoholic (07/20/9371), GERD (gastroesophageal reflux disease), Headache, Hepatic cirrhosis ([place]) (09/12/2014), Hydrocele, Hydrocele, Hypertension, IBS (irritable bowel syndrome), Lumbar herniated disc, Lumbar radiculopathy (09/05/2011), Lumbar stenosis, Peroneal tendon injury, right, initial encounter, Peroneal tendon tear, Radiculopathy, Renal insufficiency, Sleep apnea, Stenosis of cervical spine, Thoracic and lumbosacral  neuritis (07/24/2011), 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  Past Surgical History:  has a past surgical history that includes Spine surgery; kidney removed (Right); Nephrectomy (Right, 04/2011); Ureteroscopy with holmium laser lithotripsy (Left, 05/18/2015); Cystoscopy w/ retrogrades (Left, 05/18/2015); Cystoscopy with stent placement (Left, 05/18/2015); Cardiac catheterization (Right, 06/20/2015); Ureteroscopy with holmium laser lithotripsy (Left, 06/28/2015); Cystoscopy with stent placement (Left, 06/28/2015); Esophagogastroduodenoscopy (egd) with propofol (N/A, 09/03/2016); Hallux valgus base wedge (Right, 08/23/2017); ORIF toe fracture (Right, 08/23/2017); Colonoscopy; NAFLD; Joint replacement; Back surgery; Esophagogastroduodenoscopy endoscopy (N/A, 03/14/2018); and Insertion of mesh (02/27/2021).  Family History: family history includes Asthma in his mother; Diabetes in his father and mother; Heart disease in his father; Hypertension in his mother; Kidney disease in his father; Stroke in his father.  Social History:  reports that he has never smoked. He has never used smokeless tobacco. He reports that he does not drink alcohol and does not use drugs.  Current Medications:  Prior to Admission medications   Medication Sig Start Date End Date Taking? Authorizing Provider  cetirizine (ZYRTEC) 10 MG tablet Take 10 mg by mouth daily as needed for allergies.    [provider]  clopidogrel (PLAVIX) 75 MG tablet Take 75 mg by mouth daily. 03/27/21   [provider]  gabapentin (NEURONTIN) 400 MG capsule Take 400 mg by mouth 3 (three) times daily.    [provider]  insulin NPH-regular Human (70-30) 100 UNIT/ML injection Inject 55-60 Units into the skin See admin instructions. Inject 60 units in the morning and 55 units at night    [provider]  lidocaine (XYLOCAINE) 5 % ointment Apply 1 application topically 2 (two) times daily.    [provider]   metFORMIN (GLUCOPHAGE-XR) 500 MG 24 hr tablet Take 500-1,000 mg by mouth See admin instructions. Take 1000 mg in the morning and 500 mg in the  evening 12/05/15   [provider]  Multiple Vitamin (MULTIVITAMIN WITH MINERALS) TABS tablet Take 1 tablet by mouth daily.    [provider]  omeprazole (PRILOSEC) 40 MG capsule Take 40 mg by mouth daily.    [provider]  tadalafil (CIALIS) 20 MG tablet Take 1 tablet 1 hour prior to intercourse. 04/14/21   Stoioff, Ronda Fairly, MD  tiZANidine (ZANAFLEX) 4 MG capsule Take 4 mg by mouth 3 (three) times daily.    [provider]  traMADol (ULTRAM) 50 MG tablet Take 50 mg by mouth every 6 (six) hours as needed for moderate pain. for pain    [provider]  traMADol (ULTRAM) 50 MG tablet Take 1 tablet (50 mg total) by mouth every 6 (six) hours as needed. 02/27/21 02/27/22  Herbert Pun, MD    Allergies:  Allergies as of 12/11/2021 - Review Complete 12/11/2021  Allergen Reaction Noted   Hydromorphone Other (See Comments) 09/20/2014   Aspirin  02/27/2016   Nsaids  02/17/2021   Floxin [ofloxacin] Rash 09/20/2014    ROS:  General: Denies weight loss, weight gain, fatigue, fevers, chills, and night sweats. Eyes: Denies blurry vision, double vision, eye pain, itchy eyes, and tearing. Ears: Denies hearing loss, earache, and ringing in ears. Nose: Denies sinus pain, congestion, infections, runny nose, and nosebleeds. Mouth/throat: Denies hoarseness, sore throat, bleeding gums, and difficulty swallowing. Heart: Denies chest pain, palpitations, racing heart, irregular heartbeat, leg pain or swelling, and decreased activity tolerance. Respiratory: Denies breathing difficulty, shortness of breath, wheezing, cough, and sputum. GI: Denies change in appetite, heartburn, nausea, vomiting, constipation, diarrhea, and blood in stool. GU: Denies difficulty urinating, pain with urinating, urgency, frequency, blood in  urine. Musculoskeletal: Denies joint stiffness, pain, swelling, muscle weakness. Skin: Denies rash, itching, mass, tumors, sores, and boils Neurologic: Denies headache, fainting, dizziness, seizures, numbness, and tingling. Psychiatric: Denies depression, anxiety, difficulty sleeping, and memory loss. Endocrine: Denies heat or cold intolerance, and increased thirst or urination. Blood/lymph: Denies easy bruising, and swollen glands     Objective:     BP (!) 153/92   Pulse 82   Temp 99.6 F (37.6 C) (Oral)   Resp 18   Ht '6\' 2"'$  (1.88 m)   Wt (!) 137 kg   SpO2 100%   BMI 38.78 kg/m    Constitutional :  alert, cooperative, appears stated age, and mild distress  Lymphatics/Throat:  no asymmetry, masses, or scars  Respiratory:  clear to auscultation bilaterally  Cardiovascular:  regular rate and rhythm  Gastrointestinal: Soft, voluntary guarding noted in left lower quadrant and lower right lower quadrant.  Tenderness to palpation noted in the right upper quadrant as well but least tender out of all the quadrants .   Musculoskeletal: Steady movement  Skin: Cool and moist  Psychiatric: Normal affect, non-agitated, not confused       LABS:     Latest Ref Rng & Units 12/11/2021    5:48 PM 05/10/2021   10:37 AM 04/05/2021    3:40 PM  CMP  Glucose 70 - 99 mg/dL 172   237   BUN 6 - 20 mg/dL 15   23   Creatinine 0.61 - 1.24 mg/dL 1.33  1.90  1.23   Sodium 135 - 145 mmol/L 138   134   Potassium 3.5 - 5.1 mmol/L 4.4   4.1   Chloride 98 - 111 mmol/L 108   101   CO2 22 - 32 mmol/L 20   23  Calcium 8.9 - 10.3 mg/dL 8.9   9.0   Total Protein 6.5 - 8.1 g/dL 8.3   7.7   Total Bilirubin 0.3 - 1.2 mg/dL 1.5   0.7   Alkaline Phos 38 - 126 U/L 66   87   AST 15 - 41 U/L 27   30   ALT 0 - 44 U/L 26   44       Latest Ref Rng & Units 12/11/2021    5:48 PM 04/05/2021    3:40 PM 02/23/2021    1:15 PM  CBC  WBC 4.0 - 10.5 K/uL 12.2  6.7  6.6   Hemoglobin 13.0 - 17.0 g/dL 14.6  15.3  16.5    Hematocrit 39.0 - 52.0 % 43.7  45.4  49.6   Platelets 150 - 400 K/uL 307  215  177      RADS: CLINICAL DATA:  Abdomen pain history of nephrectomy   EXAM: CT ABDOMEN AND PELVIS WITH CONTRAST   TECHNIQUE: Multidetector CT imaging of the abdomen and pelvis was performed using the standard protocol following bolus administration of intravenous contrast.   RADIATION DOSE REDUCTION: This exam was performed according to the departmental dose-optimization program which includes automated exposure control, adjustment of the mA and/or kV according to patient size and/or use of iterative reconstruction technique.   CONTRAST:  137m OMNIPAQUE IOHEXOL 300 MG/ML  SOLN   COMPARISON:  CT 05/10/2021, 04/05/2021, 03/06/2018   FINDINGS: Lower chest: Lung bases demonstrate no acute airspace disease. Calcified granuloma at the left lung base.   Hepatobiliary: Hepatic steatosis. Right upper quadrant soft tissue stranding and inflammatory process appears to be centered around the gallbladder. No calcified stone. No biliary dilatation.   Pancreas: Unremarkable. No pancreatic ductal dilatation or surrounding inflammatory changes.   Spleen: Normal in size without focal abnormality.   Adrenals/Urinary Tract: Status post right nephrectomy. Adrenal glands are normal. Left kidney shows no hydronephrosis. Multiple left kidney stones measuring up to 6 mm. The bladder is unremarkable   Stomach/Bowel: Stomach nonenlarged. No dilated small bowel. No acute bowel wall thickening. Negative appendix.   Vascular/Lymphatic: Nonaneurysmal aorta.  No suspicious lymph nodes.   Reproductive: Prostate is unremarkable.   Other: Negative for pelvic effusion or free air. Fat containing right inguinal hernia   Musculoskeletal: No acute osseous abnormality.   IMPRESSION: 1. Right upper quadrant inflammatory process, appears to be centered around the gallbladder, raising concern for cholecystitis,  suggest correlation with ultrasound. 2. Hepatic steatosis 3. Status post right nephrectomy. Left kidney stones without hydronephrosis     Electronically Signed   By: KDonavan FoilM.D.   On: 12/11/2021 19:07  Narrative & Impression  CLINICAL DATA:  Right upper quadrant abdominal pain.   EXAM: ULTRASOUND ABDOMEN LIMITED RIGHT UPPER QUADRANT   COMPARISON:  CT abdomen pelvis dated 12/11/2021 and ultrasound dated 04/15/2020.   FINDINGS: Gallbladder:   A 7 mm echogenic focus along the gallbladder wall with no posterior shadowing may represent an adherent sludge. A polyp is less likely. No shadowing stone. The gallbladder wall is thickened and cysts edematous measuring up to 6-7 mm in thickness. There is trace pericholecystic fluid. Positive sonographic Murphy's sign is reported.   Common bile duct:   Diameter: 7 mm   Liver:   There is diffuse increased liver echogenicity most commonly seen in the setting of fatty infiltration. Superimposed inflammation or fibrosis is not excluded. Clinical correlation is recommended. Portal vein is patent on color Doppler imaging with normal direction of  blood flow towards the liver.   Other: None.   IMPRESSION: 1. Probable gallbladder sludge with thickened gallbladder wall and trace pericholecystic fluid. Findings may be related to underlying liver disease or represent acute cholecystitis. A hepatobiliary scintigraphy may provide better evaluation of the gallbladder if there is a high clinical concern for acute cholecystitis . 2. Fatty liver.     Electronically Signed   By: Anner Crete M.D.   On: 12/11/2021 21:44     Assessment:      Severe diarrhea, diffuse abdominal pain Gallbladder sludge and additional ultrasound findings noted above  Plan:   History is not consistent with typical acute cholecystitis, especially with the very severe diarrhea and diffuse rather than localized abdominal pain.  Complaining of black  specks in his stool may be indications of possible GI bleed.  Hemoglobin is still within normal limits but it did drop close to a gram compared to a month ago.  Recommend proceeding with a HIDA scan to see if there is actual gallbladder dysfunction due to the atypical presentation, and not definitive ultrasound report as noted above recommend admission to hospitalist service for his diarrhea and further work-up for possible other etiologies in the meantime.  Of note, patient med rec states he is on Plavix.  Patient cannot definitively say whether or not he Is still on it.  Recommend holding the Plavix for now just in case HIDA scan comes back positive and need to proceed with a cholecystectomy during this admission.  N.p.o. in the meantime to prevent further episodes of diarrhea and dehydration.  labs/images/medications/previous chart entries reviewed personally and relevant changes/updates noted above.

## 2021-12-11 NOTE — ED Notes (Signed)
Portable ultrasound at bedside at this time.

## 2021-12-12 ENCOUNTER — Observation Stay: Payer: Medicare HMO

## 2021-12-12 ENCOUNTER — Other Ambulatory Visit: Payer: Self-pay | Admitting: Radiology

## 2021-12-12 DIAGNOSIS — N200 Calculus of kidney: Secondary | ICD-10-CM | POA: Diagnosis present

## 2021-12-12 DIAGNOSIS — Z888 Allergy status to other drugs, medicaments and biological substances status: Secondary | ICD-10-CM | POA: Diagnosis not present

## 2021-12-12 DIAGNOSIS — Z825 Family history of asthma and other chronic lower respiratory diseases: Secondary | ICD-10-CM | POA: Diagnosis not present

## 2021-12-12 DIAGNOSIS — Z8249 Family history of ischemic heart disease and other diseases of the circulatory system: Secondary | ICD-10-CM | POA: Diagnosis not present

## 2021-12-12 DIAGNOSIS — Z841 Family history of disorders of kidney and ureter: Secondary | ICD-10-CM | POA: Diagnosis not present

## 2021-12-12 DIAGNOSIS — I10 Essential (primary) hypertension: Secondary | ICD-10-CM | POA: Diagnosis present

## 2021-12-12 DIAGNOSIS — Z8619 Personal history of other infectious and parasitic diseases: Secondary | ICD-10-CM | POA: Diagnosis not present

## 2021-12-12 DIAGNOSIS — Z6838 Body mass index (BMI) 38.0-38.9, adult: Secondary | ICD-10-CM | POA: Diagnosis not present

## 2021-12-12 DIAGNOSIS — K7460 Unspecified cirrhosis of liver: Secondary | ICD-10-CM | POA: Diagnosis present

## 2021-12-12 DIAGNOSIS — Z885 Allergy status to narcotic agent status: Secondary | ICD-10-CM | POA: Diagnosis not present

## 2021-12-12 DIAGNOSIS — G4733 Obstructive sleep apnea (adult) (pediatric): Secondary | ICD-10-CM | POA: Diagnosis present

## 2021-12-12 DIAGNOSIS — Z886 Allergy status to analgesic agent status: Secondary | ICD-10-CM | POA: Diagnosis not present

## 2021-12-12 DIAGNOSIS — K529 Noninfective gastroenteritis and colitis, unspecified: Secondary | ICD-10-CM | POA: Diagnosis present

## 2021-12-12 DIAGNOSIS — K219 Gastro-esophageal reflux disease without esophagitis: Secondary | ICD-10-CM | POA: Diagnosis present

## 2021-12-12 DIAGNOSIS — E1142 Type 2 diabetes mellitus with diabetic polyneuropathy: Secondary | ICD-10-CM | POA: Diagnosis present

## 2021-12-12 DIAGNOSIS — E876 Hypokalemia: Secondary | ICD-10-CM | POA: Diagnosis present

## 2021-12-12 DIAGNOSIS — R1011 Right upper quadrant pain: Secondary | ICD-10-CM

## 2021-12-12 DIAGNOSIS — K828 Other specified diseases of gallbladder: Secondary | ICD-10-CM | POA: Diagnosis present

## 2021-12-12 DIAGNOSIS — A045 Campylobacter enteritis: Secondary | ICD-10-CM | POA: Diagnosis present

## 2021-12-12 DIAGNOSIS — Z823 Family history of stroke: Secondary | ICD-10-CM | POA: Diagnosis not present

## 2021-12-12 DIAGNOSIS — Z20822 Contact with and (suspected) exposure to covid-19: Secondary | ICD-10-CM | POA: Diagnosis present

## 2021-12-12 DIAGNOSIS — E669 Obesity, unspecified: Secondary | ICD-10-CM | POA: Diagnosis present

## 2021-12-12 DIAGNOSIS — E1165 Type 2 diabetes mellitus with hyperglycemia: Secondary | ICD-10-CM | POA: Diagnosis present

## 2021-12-12 DIAGNOSIS — K760 Fatty (change of) liver, not elsewhere classified: Secondary | ICD-10-CM | POA: Diagnosis present

## 2021-12-12 DIAGNOSIS — Z85528 Personal history of other malignant neoplasm of kidney: Secondary | ICD-10-CM | POA: Diagnosis not present

## 2021-12-12 DIAGNOSIS — Z833 Family history of diabetes mellitus: Secondary | ICD-10-CM | POA: Diagnosis not present

## 2021-12-12 LAB — BASIC METABOLIC PANEL
Anion gap: 7 (ref 5–15)
BUN: 16 mg/dL (ref 6–20)
CO2: 23 mmol/L (ref 22–32)
Calcium: 8.5 mg/dL — ABNORMAL LOW (ref 8.9–10.3)
Chloride: 108 mmol/L (ref 98–111)
Creatinine, Ser: 1.31 mg/dL — ABNORMAL HIGH (ref 0.61–1.24)
GFR, Estimated: >60 mL/min (ref >60)
Glucose, Bld: 118 mg/dL — ABNORMAL HIGH (ref 70–99)
Potassium: 3.3 mmol/L — ABNORMAL LOW (ref 3.5–5.1)
Sodium: 138 mmol/L (ref 135–145)

## 2021-12-12 LAB — URINALYSIS, ROUTINE W REFLEX MICROSCOPIC
Bacteria, UA: NONE SEEN
Bilirubin Urine: NEGATIVE
Glucose, UA: NEGATIVE mg/dL
Hgb urine dipstick: NEGATIVE
Ketones, ur: NEGATIVE mg/dL
Leukocytes,Ua: NEGATIVE
Nitrite: NEGATIVE
Protein, ur: 100 mg/dL — AB
Specific Gravity, Urine: >1.046 — ABNORMAL HIGH (ref 1.005–1.030)
Squamous Epithelial / HPF: NONE SEEN (ref 0–5)
pH: 5 (ref 5.0–8.0)

## 2021-12-12 LAB — GASTROINTESTINAL PANEL BY PCR, STOOL (REPLACES STOOL CULTURE)

## 2021-12-12 LAB — GLUCOSE, CAPILLARY
Glucose-Capillary: 123 mg/dL — ABNORMAL HIGH (ref 70–99)
Glucose-Capillary: 128 mg/dL — ABNORMAL HIGH (ref 70–99)
Glucose-Capillary: 144 mg/dL — ABNORMAL HIGH (ref 70–99)
Glucose-Capillary: 201 mg/dL — ABNORMAL HIGH (ref 70–99)

## 2021-12-12 LAB — CBC
HCT: 41.3 % (ref 39.0–52.0)
Hemoglobin: 14 g/dL (ref 13.0–17.0)
MCH: 29.2 pg (ref 26.0–34.0)
MCHC: 33.9 g/dL (ref 30.0–36.0)
MCV: 86 fL (ref 80.0–100.0)
Platelets: 248 10*3/uL (ref 150–400)
RBC: 4.8 MIL/uL (ref 4.22–5.81)
RDW: 13 % (ref 11.5–15.5)
WBC: 8.8 10*3/uL (ref 4.0–10.5)
nRBC: 0 % (ref 0.0–0.2)

## 2021-12-12 LAB — HEMOGLOBIN A1C
Hgb A1c MFr Bld: 7.6 % — ABNORMAL HIGH (ref 4.8–5.6)
Mean Plasma Glucose: 171.42 mg/dL

## 2021-12-12 LAB — C DIFFICILE QUICK SCREEN W PCR REFLEX
C Diff antigen: NEGATIVE
C Diff interpretation: NOT DETECTED
C Diff toxin: NEGATIVE

## 2021-12-12 LAB — HIV ANTIBODY (ROUTINE TESTING W REFLEX): HIV Screen 4th Generation wRfx: NONREACTIVE

## 2021-12-12 MED ORDER — INSULIN ASPART PROT & ASPART (70-30 MIX) 100 UNIT/ML ~~LOC~~ SUSP
40.0000 [IU] | Freq: Once | SUBCUTANEOUS | Status: DC
  Filled 2021-12-12: qty 10

## 2021-12-12 MED ORDER — GUAIFENESIN-DM 100-10 MG/5ML PO SYRP
5.0000 mL | ORAL_SOLUTION | ORAL | Status: DC | PRN
  Administered 2021-12-12 – 2021-12-13 (×2): 5 mL via ORAL
  Filled 2021-12-12 (×2): qty 10

## 2021-12-12 MED ORDER — TECHNETIUM TC 99M MEBROFENIN IV KIT
4.9700 | PACK | Freq: Once | INTRAVENOUS | Status: AC | PRN
  Administered 2021-12-12: 4.97 via INTRAVENOUS

## 2021-12-12 MED ORDER — SODIUM CHLORIDE 0.9 % IV SOLN
500.0000 mg | INTRAVENOUS | Status: DC
  Administered 2021-12-12: 500 mg via INTRAVENOUS
  Filled 2021-12-12: qty 5

## 2021-12-12 MED ORDER — INSULIN ASPART 100 UNIT/ML IJ SOLN
0.0000 [IU] | INTRAMUSCULAR | Status: DC
  Administered 2021-12-12: 3 [IU] via SUBCUTANEOUS
  Administered 2021-12-12: 5 [IU] via SUBCUTANEOUS
  Administered 2021-12-13: 2 [IU] via SUBCUTANEOUS
  Administered 2021-12-13: 3 [IU] via SUBCUTANEOUS
  Administered 2021-12-13 (×2): 2 [IU] via SUBCUTANEOUS
  Administered 2021-12-13: 3 [IU] via SUBCUTANEOUS
  Administered 2021-12-14 (×2): 2 [IU] via SUBCUTANEOUS
  Filled 2021-12-12 (×11): qty 1

## 2021-12-12 MED ORDER — INSULIN ASPART 100 UNIT/ML IJ SOLN: 0.0000 [IU] | Freq: Three times a day (TID) | INTRAMUSCULAR | Status: DC

## 2021-12-12 MED ORDER — POTASSIUM CHLORIDE CRYS ER 20 MEQ PO TBCR
40.0000 meq | EXTENDED_RELEASE_TABLET | Freq: Once | ORAL | Status: AC
  Administered 2021-12-12: 40 meq via ORAL
  Filled 2021-12-12: qty 2

## 2021-12-12 MED ORDER — INSULIN ASPART 100 UNIT/ML IJ SOLN: 0.0000 [IU] | Freq: Every day | INTRAMUSCULAR | Status: DC

## 2021-12-12 MED ORDER — LEVOFLOXACIN 750 MG PO TABS
750.0000 mg | ORAL_TABLET | Freq: Every day | ORAL | Status: DC
  Administered 2021-12-12 – 2021-12-14 (×3): 750 mg via ORAL
  Filled 2021-12-12 (×3): qty 1

## 2021-12-12 MED ORDER — INSULIN ASPART PROT & ASPART (70-30 MIX) 100 UNIT/ML ~~LOC~~ SUSP
55.0000 [IU] | Freq: Every day | SUBCUTANEOUS | Status: DC
  Filled 2021-12-12: qty 10

## 2021-12-12 MED ORDER — AZITHROMYCIN 500 MG IV SOLR: 500.0000 mg | Freq: Two times a day (BID) | INTRAVENOUS | Status: DC

## 2021-12-12 NOTE — Progress Notes (Signed)
Subjective:  CC: Anthony Fuller is a 61 y.o. male  Hospital stay day 0,   gastroenteritis  HPI: Campylobacter positive.  Patient states pain overall remains same, still diffuse  ROS:  General: Denies weight loss, weight gain, fatigue, fevers, chills, and night sweats. Heart: Denies chest pain, palpitations, racing heart, irregular heartbeat, leg pain or swelling, and decreased activity tolerance. Respiratory: Denies breathing difficulty, shortness of breath, wheezing, cough, and sputum. GI: Denies change in appetite, heartburn, nausea, vomiting, constipation, diarrhea, and blood in stool. GU: Denies difficulty urinating, pain with urinating, urgency, frequency, blood in urine.   Objective:   Temp:  [97.6 F (36.4 C)-99.6 F (37.6 C)] 98.3 F (36.8 C) (08/01 1946) Pulse Rate:  [58-82] 62 (08/01 1946) Resp:  [16-20] 20 (08/01 1946) BP: (101-153)/(59-92) 133/76 (08/01 1946) SpO2:  [93 %-100 %] 98 % (08/01 1946)     Height: '6\' 2"'$  (188 cm) Weight: (!) 137 kg BMI (Calculated): 38.76   Intake/Output this shift:   Intake/Output Summary (Last 24 hours) at 12/12/2021 2000 Last data filed at 12/12/2021 1724 Gross per 24 hour  Intake 1592.14 ml  Output --  Net 1592.14 ml    Constitutional :  alert, cooperative, appears stated age, and no distress  Respiratory:  clear to auscultation bilaterally  Cardiovascular:  regular rate and rhythm  Gastrointestinal: Soft, no guarding, focal TTP in LLQ, nothing in RUQ today .   Skin: Cool and moist.   Psychiatric: Normal affect, non-agitated, not confused       LABS:     Latest Ref Rng & Units 12/12/2021    5:50 AM 12/11/2021    5:48 PM 05/10/2021   10:37 AM  CMP  Glucose 70 - 99 mg/dL 118  172    BUN 6 - 20 mg/dL 16  15    Creatinine 0.61 - 1.24 mg/dL 1.31  1.33  1.90   Sodium 135 - 145 mmol/L 138  138    Potassium 3.5 - 5.1 mmol/L 3.3  4.4    Chloride 98 - 111 mmol/L 108  108    CO2 22 - 32 mmol/L 23  20    Calcium 8.9 - 10.3 mg/dL 8.5   8.9    Total Protein 6.5 - 8.1 g/dL  8.3    Total Bilirubin 0.3 - 1.2 mg/dL  1.5    Alkaline Phos 38 - 126 U/L  66    AST 15 - 41 U/L  27    ALT 0 - 44 U/L  26        Latest Ref Rng & Units 12/12/2021    5:50 AM 12/11/2021    5:48 PM 04/05/2021    3:40 PM  CBC  WBC 4.0 - 10.5 K/uL 8.8  12.2  6.7   Hemoglobin 13.0 - 17.0 g/dL 14.0  14.6  15.3   Hematocrit 39.0 - 52.0 % 41.3  43.7  45.4   Platelets 150 - 400 K/uL 248  307  215     RADS: EXAM: NUCLEAR MEDICINE HEPATOBILIARY IMAGING   TECHNIQUE: Sequential images of the abdomen were obtained out to 60 minutes following intravenous administration of radiopharmaceutical.   RADIOPHARMACEUTICALS:  4.9 mCi Tc-19m Choletec IV   COMPARISON:  CT 12/11/2021   FINDINGS: Prompt clearance radiotracer from blood pool and homogeneous uptake in liver. Counts are present in the small bowel by 25 minutes. At 50 minutes, the gallbladder begins to fill.   IMPRESSION: 1. Patent cystic duct with filling of the gallbladder.  2. Patent common bile duct.     Electronically Signed   By: Suzy Bouchard M.D.   On: 12/12/2021 14:38   Assessment:   Gastroenteritis with negative HIDA.  No need to pursue surgical intervention due to above.  Still asymptomatic from GB standpoint.  Surgery to sign off.  Please call with questions  labs/images/medications/previous chart entries reviewed personally and relevant changes/updates noted above.

## 2021-12-12 NOTE — Assessment & Plan Note (Signed)
-   We will continue PPI therapy 

## 2021-12-12 NOTE — Consult Note (Signed)
Subjective:   CC: diarrhea and abdominal pain  HPI:  Anthony Fuller is a 61 y.o. male who is consulted by [facility] for evaluation of above cc.  Symptoms were first noted several days ago. Pain is diffuse, crampy, constant, worsening since noted at the same time as the onset of diarrhea.  Describes diarrhea as multiple times throughout the day only liquid initially started greenish color but now notes specks of black stooling similar to "cigarette ashes" .  Denies any sick contacts or recent travel around time of symptom onset.  Pain continued to worsen so decided to come into the ED.  Pain is not worse in one place or the other.  Stool studies negative at primary's office, but not tested for C. difficile.  No hematochezia.  Nauseous but no episodes of vomiting  Past Medical History:  has a past medical history of Acid indigestion (02/14/2012), Anxiety, Arthritis, Bulge of lumbar disc without myelopathy (09/05/2011), Cancer ([place]) (2012), Cervical stenosis of spine, Cervicalgia, Chest wall pain, Clostridium difficile infection (02/04/2014), DDD (degenerative disc disease), lumbar, DDD (degenerative disc disease), lumbar, Degeneration of intervertebral disc of lumbar region (08/23/2011), Diabetes mellitus ([place]) (07/24/2011), Diabetes mellitus without complication ([place]), Diabetic polyneuropathy associated with type 2 diabetes mellitus ([place]), Diarrhea, DM (diabetes mellitus) type 2, uncontrolled, with ketoacidosis ([place]) (05/30/2015), Dyspepsia, Enthesopathy of hip (04/11/2015), Fatty liver disease, nonalcoholic (10/13/9507), GERD (gastroesophageal reflux disease), Headache, Hepatic cirrhosis ([place]) (09/12/2014), Hydrocele, Hydrocele, Hypertension, IBS (irritable bowel syndrome), Lumbar herniated disc, Lumbar radiculopathy (09/05/2011), Lumbar stenosis, Peroneal tendon injury, right, initial encounter, Peroneal tendon tear, Radiculopathy, Renal insufficiency, Sleep apnea, Stenosis of cervical spine, Thoracic and lumbosacral  neuritis (07/24/2011), 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  Past Surgical History:  has a past surgical history that includes Spine surgery; kidney removed (Right); Nephrectomy (Right, 04/2011); Ureteroscopy with holmium laser lithotripsy (Left, 05/18/2015); Cystoscopy w/ retrogrades (Left, 05/18/2015); Cystoscopy with stent placement (Left, 05/18/2015); Cardiac catheterization (Right, 06/20/2015); Ureteroscopy with holmium laser lithotripsy (Left, 06/28/2015); Cystoscopy with stent placement (Left, 06/28/2015); Esophagogastroduodenoscopy (egd) with propofol (N/A, 09/03/2016); Hallux valgus base wedge (Right, 08/23/2017); ORIF toe fracture (Right, 08/23/2017); Colonoscopy; NAFLD; Joint replacement; Back surgery; Esophagogastroduodenoscopy endoscopy (N/A, 03/14/2018); and Insertion of mesh (02/27/2021).  Family History: family history includes Asthma in his mother; Diabetes in his father and mother; Heart disease in his father; Hypertension in his mother; Kidney disease in his father; Stroke in his father.  Social History:  reports that he has never smoked. He has never used smokeless tobacco. He reports that he does not drink alcohol and does not use drugs.  Current Medications:  Prior to Admission medications   Medication Sig Start Date End Date Taking? Authorizing Provider  cetirizine (ZYRTEC) 10 MG tablet Take 10 mg by mouth daily as needed for allergies.    [provider]  clopidogrel (PLAVIX) 75 MG tablet Take 75 mg by mouth daily. 03/27/21   [provider]  gabapentin (NEURONTIN) 400 MG capsule Take 400 mg by mouth 3 (three) times daily.    [provider]  insulin NPH-regular Human (70-30) 100 UNIT/ML injection Inject 55-60 Units into the skin See admin instructions. Inject 60 units in the morning and 55 units at night    [provider]  lidocaine (XYLOCAINE) 5 % ointment Apply 1 application topically 2 (two) times daily.    [provider]   metFORMIN (GLUCOPHAGE-XR) 500 MG 24 hr tablet Take 500-1,000 mg by mouth See admin instructions. Take 1000 mg in the morning and 500 mg in the  evening 12/05/15   [provider]  Multiple Vitamin (MULTIVITAMIN WITH MINERALS) TABS tablet Take 1 tablet by mouth daily.    [provider]  omeprazole (PRILOSEC) 40 MG capsule Take 40 mg by mouth daily.    [provider]  tadalafil (CIALIS) 20 MG tablet Take 1 tablet 1 hour prior to intercourse. 04/14/21   Stoioff, Ronda Fairly, MD  tiZANidine (ZANAFLEX) 4 MG capsule Take 4 mg by mouth 3 (three) times daily.    [provider]  traMADol (ULTRAM) 50 MG tablet Take 50 mg by mouth every 6 (six) hours as needed for moderate pain. for pain    [provider]  traMADol (ULTRAM) 50 MG tablet Take 1 tablet (50 mg total) by mouth every 6 (six) hours as needed. 02/27/21 02/27/22  Herbert Pun, MD    Allergies:  Allergies as of 12/11/2021 - Review Complete 12/11/2021  Allergen Reaction Noted   Hydromorphone Other (See Comments) 09/20/2014   Aspirin  02/27/2016   Nsaids  02/17/2021   Floxin [ofloxacin] Rash 09/20/2014    ROS:  General: Denies weight loss, weight gain, fatigue, fevers, chills, and night sweats. Eyes: Denies blurry vision, double vision, eye pain, itchy eyes, and tearing. Ears: Denies hearing loss, earache, and ringing in ears. Nose: Denies sinus pain, congestion, infections, runny nose, and nosebleeds. Mouth/throat: Denies hoarseness, sore throat, bleeding gums, and difficulty swallowing. Heart: Denies chest pain, palpitations, racing heart, irregular heartbeat, leg pain or swelling, and decreased activity tolerance. Respiratory: Denies breathing difficulty, shortness of breath, wheezing, cough, and sputum. GI: Denies change in appetite, heartburn, nausea, vomiting, constipation, diarrhea, and blood in stool. GU: Denies difficulty urinating, pain with urinating, urgency, frequency, blood in  urine. Musculoskeletal: Denies joint stiffness, pain, swelling, muscle weakness. Skin: Denies rash, itching, mass, tumors, sores, and boils Neurologic: Denies headache, fainting, dizziness, seizures, numbness, and tingling. Psychiatric: Denies depression, anxiety, difficulty sleeping, and memory loss. Endocrine: Denies heat or cold intolerance, and increased thirst or urination. Blood/lymph: Denies easy bruising, and swollen glands     Objective:     BP 130/87 (BP Location: Left Arm)   Pulse 72   Temp 97.6 F (36.4 C) (Oral)   Resp 18   Ht '6\' 2"'$  (1.88 m)   Wt (!) 137 kg   SpO2 95%   BMI 38.78 kg/m    Constitutional :  alert, cooperative, appears stated age, and mild distress  Lymphatics/Throat:  no asymmetry, masses, or scars  Respiratory:  clear to auscultation bilaterally  Cardiovascular:  regular rate and rhythm  Gastrointestinal: Soft, voluntary guarding noted in left lower quadrant and lower right lower quadrant.  Tenderness to palpation noted in the right upper quadrant as well but least tender out of all the quadrants .   Musculoskeletal: Steady movement  Skin: Cool and moist  Psychiatric: Normal affect, non-agitated, not confused       LABS:     Latest Ref Rng & Units 12/12/2021    5:50 AM 12/11/2021    5:48 PM 05/10/2021   10:37 AM  CMP  Glucose 70 - 99 mg/dL 118  172    BUN 6 - 20 mg/dL 16  15    Creatinine 0.61 - 1.24 mg/dL 1.31  1.33  1.90   Sodium 135 - 145 mmol/L 138  138    Potassium 3.5 - 5.1 mmol/L 3.3  4.4    Chloride 98 - 111 mmol/L 108  108    CO2 22 - 32 mmol/L 23  20  Calcium 8.9 - 10.3 mg/dL 8.5  8.9    Total Protein 6.5 - 8.1 g/dL  8.3    Total Bilirubin 0.3 - 1.2 mg/dL  1.5    Alkaline Phos 38 - 126 U/L  66    AST 15 - 41 U/L  27    ALT 0 - 44 U/L  26        Latest Ref Rng & Units 12/12/2021    5:50 AM 12/11/2021    5:48 PM 04/05/2021    3:40 PM  CBC  WBC 4.0 - 10.5 K/uL 8.8  12.2  6.7   Hemoglobin 13.0 - 17.0 g/dL 14.0  14.6  15.3    Hematocrit 39.0 - 52.0 % 41.3  43.7  45.4   Platelets 150 - 400 K/uL 248  307  215      RADS: CLINICAL DATA:  Abdomen pain history of nephrectomy   EXAM: CT ABDOMEN AND PELVIS WITH CONTRAST   TECHNIQUE: Multidetector CT imaging of the abdomen and pelvis was performed using the standard protocol following bolus administration of intravenous contrast.   RADIATION DOSE REDUCTION: This exam was performed according to the departmental dose-optimization program which includes automated exposure control, adjustment of the mA and/or kV according to patient size and/or use of iterative reconstruction technique.   CONTRAST:  170m OMNIPAQUE IOHEXOL 300 MG/ML  SOLN   COMPARISON:  CT 05/10/2021, 04/05/2021, 03/06/2018   FINDINGS: Lower chest: Lung bases demonstrate no acute airspace disease. Calcified granuloma at the left lung base.   Hepatobiliary: Hepatic steatosis. Right upper quadrant soft tissue stranding and inflammatory process appears to be centered around the gallbladder. No calcified stone. No biliary dilatation.   Pancreas: Unremarkable. No pancreatic ductal dilatation or surrounding inflammatory changes.   Spleen: Normal in size without focal abnormality.   Adrenals/Urinary Tract: Status post right nephrectomy. Adrenal glands are normal. Left kidney shows no hydronephrosis. Multiple left kidney stones measuring up to 6 mm. The bladder is unremarkable   Stomach/Bowel: Stomach nonenlarged. No dilated small bowel. No acute bowel wall thickening. Negative appendix.   Vascular/Lymphatic: Nonaneurysmal aorta.  No suspicious lymph nodes.   Reproductive: Prostate is unremarkable.   Other: Negative for pelvic effusion or free air. Fat containing right inguinal hernia   Musculoskeletal: No acute osseous abnormality.   IMPRESSION: 1. Right upper quadrant inflammatory process, appears to be centered around the gallbladder, raising concern for cholecystitis,  suggest correlation with ultrasound. 2. Hepatic steatosis 3. Status post right nephrectomy. Left kidney stones without hydronephrosis     Electronically Signed   By: KDonavan FoilM.D.   On: 12/11/2021 19:07  Narrative & Impression  CLINICAL DATA:  Right upper quadrant abdominal pain.   EXAM: ULTRASOUND ABDOMEN LIMITED RIGHT UPPER QUADRANT   COMPARISON:  CT abdomen pelvis dated 12/11/2021 and ultrasound dated 04/15/2020.   FINDINGS: Gallbladder:   A 7 mm echogenic focus along the gallbladder wall with no posterior shadowing may represent an adherent sludge. A polyp is less likely. No shadowing stone. The gallbladder wall is thickened and cysts edematous measuring up to 6-7 mm in thickness. There is trace pericholecystic fluid. Positive sonographic Murphy's sign is reported.   Common bile duct:   Diameter: 7 mm   Liver:   There is diffuse increased liver echogenicity most commonly seen in the setting of fatty infiltration. Superimposed inflammation or fibrosis is not excluded. Clinical correlation is recommended. Portal vein is patent on color Doppler imaging with normal direction of blood flow towards the liver.  Other: None.   IMPRESSION: 1. Probable gallbladder sludge with thickened gallbladder wall and trace pericholecystic fluid. Findings may be related to underlying liver disease or represent acute cholecystitis. A hepatobiliary scintigraphy may provide better evaluation of the gallbladder if there is a high clinical concern for acute cholecystitis . 2. Fatty liver.     Electronically Signed   By: Anner Crete M.D.   On: 12/11/2021 21:44     Assessment:      Severe diarrhea, diffuse abdominal pain Gallbladder sludge and additional ultrasound findings noted above  Plan:   History is not consistent with typical acute cholecystitis, especially with the very severe diarrhea and diffuse rather than localized abdominal pain.  Complaining of black  specks in his stool may be indications of possible GI bleed.  Hemoglobin is still within normal limits but it did drop close to a gram compared to a month ago.  Recommend proceeding with a HIDA scan to see if there is actual gallbladder dysfunction due to the atypical presentation, and not definitive ultrasound report as noted above recommend admission to hospitalist service for his diarrhea and further work-up for possible other etiologies in the meantime.  Of note, patient med rec states he is on Plavix.  Patient cannot definitively say whether or not he Is still on it.  Recommend holding the Plavix for now just in case HIDA scan comes back positive and need to proceed with a cholecystectomy during this admission.  N.p.o. in the meantime to prevent further episodes of diarrhea and dehydration.  labs/images/medications/previous chart entries reviewed personally and relevant changes/updates noted above.

## 2021-12-12 NOTE — Assessment & Plan Note (Addendum)
-   The patient has a gallbladder sludge and suspicious inflammation around the gallbladder and the abdominal CT scan. - Dr. Lysle Pearl was consulted and will follow HIDA scan in a.m. - We will keep the patient n.p.o. after midnight and hold off his Plavix pending HIDA scan results.

## 2021-12-12 NOTE — Progress Notes (Signed)
       CROSS COVER NOTE  NAME: Anthony Fuller MRN: [id number] DOB : 1960-07-13    Date of Service   12/12/2021  HPI/Events of Note   Notified of GI panel positive for Campylobacter. Per H&P [name] has been symptomatic for 10+ days and febrile with Tmax 102 at home.   Interventions   Plan:  Campylobacter Gastroenteritis IV Azithromycin x 3 days     This document was prepared using Dragon voice recognition software and may include unintentional dictation errors.  Neomia Glass DNP, MHA, FNP-BC Nurse Practitioner [facility] Pager [phone]

## 2021-12-12 NOTE — H&P (Signed)
[name]   PATIENT NAME: Anthony Fuller    MR#:  [id number]  DATE OF BIRTH:  03/10/1961  DATE OF ADMISSION:  12/11/2021  PRIMARY CARE PHYSICIAN: Valera Castle, MD   Patient is coming from: Home  REQUESTING/REFERRING PHYSICIAN: Marjean Donna, MD  CHIEF COMPLAINT:   Chief Complaint  Patient presents with   Abdominal Pain    HISTORY OF PRESENT ILLNESS:  Anthony Fuller is a 61 y.o. Caucasian male with medical history significant for GERD, type 2 diabetes mellitus with peripheral neuropathy, OSA, DDD and renal cancer status post right nephrectomy, who presented to the emergency room with a [place] intractable abdominal pain with associated diarrhea over the last 10 days as well as nausea without vomiting.  He had a fever of 101-102 at home with occasional chills.  He admitted to occasional chest soreness with radiation and denied any dyspnea or cough or wheezing or hemoptysis.  No dysuria, oliguria or hematuria or flank pain.  ED Course: When he came to the ER, temperature was 100.1, BP was 162/94 with otherwise normal vital signs.  Labs revealed a blood glucose of 172 with a CO2 of 20 and creatinine 1.33 and total bili 1.5 with otherwise unremarkable CMP.  CBC showed leukocytosis 12.2 with neutrophilia EKG as reviewed by me : EKG showed normal sinus rhythm with a rate of 89 with T wave inversion inferiorly Imaging: Portable chest x-ray showed no acute cardiopulmonary. Abdominal pelvic CT scan revealed the following: 1. Right upper quadrant inflammatory process, appears to be centered around the gallbladder, raising concern for cholecystitis, suggest correlation with ultrasound. 2. Hepatic steatosis 3. Status post right nephrectomy. Left kidney stones without hydronephrosis. Right upper quadrant ultrasound revealed the following: 1. Probable gallbladder sludge with thickened gallbladder wall and trace pericholecystic fluid. Findings may be related to  underlying liver disease or represent acute cholecystitis. A hepatobiliary scintigraphy may provide better evaluation of the gallbladder if there is a high clinical concern for acute cholecystitis . 2. Fatty liver.  The patient was given 50 mcg of IV fentanyl, 4 mg of IV Zofran and general surgery consult was obtained by Dr. Lysle Pearl is recommending HIDA scan in a.m.  The patient will be admitted to a medical observation bed for further evaluation and management.  PAST MEDICAL HISTORY:   Past Medical History:  Diagnosis Date   Acid indigestion 02/14/2012   Anxiety    Arthritis    BIL.KNEES   Bulge of lumbar disc without myelopathy 09/05/2011   Cancer ([place]) 2012   R kidney removed   Cervical stenosis of spine    Cervicalgia    Chest wall pain    Clostridium difficile infection 02/04/2014   DDD (degenerative disc disease), lumbar    DDD (degenerative disc disease), lumbar    Degeneration of intervertebral disc of lumbar region 08/23/2011   Diabetes mellitus ([place]) 07/24/2011   Diabetes mellitus without complication ([place])    Diabetic polyneuropathy associated with type 2 diabetes mellitus ([place])    Diarrhea    DM (diabetes mellitus) type 2, uncontrolled, with ketoacidosis ([place]) 05/30/2015   Dyspepsia    Enthesopathy of hip 04/11/2015   Fatty liver disease, nonalcoholic 10/16/7844   GERD (gastroesophageal reflux disease)    Headache    Hepatic cirrhosis (HCC) 09/12/2014   Hydrocele    Hydrocele    Hypertension    IBS (irritable bowel syndrome)    Lumbar herniated disc    Lumbar radiculopathy 09/05/2011   Lumbar stenosis  Peroneal tendon injury, right, initial encounter    Peroneal tendon tear    Radiculopathy    Renal insufficiency    Right Nephrectomy due to [place].   Sleep apnea    snores   Stenosis of cervical spine    Thoracic and lumbosacral neuritis 07/24/2011   Thrombocytopathia ([place])    Thrombocytopenia (HCC)     PAST SURGICAL HISTORY:   Past Surgical History:  Procedure  Laterality Date   BACK SURGERY     cervical fusion C5-7   CARDIAC CATHETERIZATION Right 06/20/2015   Procedure: Left Heart Cath and Coronary Angiography;  Surgeon: Dionisio David, MD;  Location: [facility];  Service: Cardiovascular;  Laterality: Right;   COLONOSCOPY     CYSTOSCOPY W/ RETROGRADES Left 05/18/2015   Procedure: CYSTOSCOPY WITH RETROGRADE PYELOGRAM;  Surgeon: Nickie Retort, MD;  Location: [facility];  Service: Urology;  Laterality: Left;   CYSTOSCOPY WITH STENT PLACEMENT Left 05/18/2015   Procedure: CYSTOSCOPY WITH STENT PLACEMENT;  Surgeon: Nickie Retort, MD;  Location: [facility];  Service: Urology;  Laterality: Left;   CYSTOSCOPY WITH STENT PLACEMENT Left 06/28/2015   Procedure: CYSTOSCOPY WITH STENT PLACEMENT/ EXCHANGE;  Surgeon: Hollice Espy, MD;  Location: [facility];  Service: Urology;  Laterality: Left;   ESOPHAGOGASTRODUODENOSCOPY (EGD) WITH PROPOFOL N/A 09/03/2016   Procedure: ESOPHAGOGASTRODUODENOSCOPY (EGD) WITH PROPOFOL;  Surgeon: Manya Silvas, MD;  Location: [facility];  Service: Endoscopy;  Laterality: N/A;   ESOPHAGOGASTRODUODENOSCOPY ENDOSCOPY N/A 03/14/2018   Procedure: ESOPHAGOGASTRODUODENOSCOPY ENDOSCOPY;  Surgeon: Manya Silvas, MD;  Location: [facility] ENDOSCOPY;  Service: Endoscopy;  Laterality: N/A;   HALLUX VALGUS BASE WEDGE Right 08/23/2017   Procedure: HALLUX VALGUS BASE WEDGE;  Surgeon: Samara Deist, DPM;  Location: [facility];  Service: Podiatry;  Laterality: Right;   INSERTION OF MESH  02/27/2021   Procedure: INSERTION OF MESH;  Surgeon: Herbert Pun, MD;  Location: [facility];  Service: General;;   JOINT REPLACEMENT     right knee    kidney removed Right    2012   NAFLD     NEPHRECTOMY Right 04/2011   ORIF TOE FRACTURE Right 08/23/2017   Procedure: OPEN REDUCTION INTERNAL FIXATION (ORIF) METATARSAL (TOE) FRACTURE;  Surgeon: Samara Deist, DPM;  Location: [facility];  Service: Podiatry;  Laterality: Right;   SPINE SURGERY      lumbar 2013   URETEROSCOPY WITH HOLMIUM LASER LITHOTRIPSY Left 05/18/2015   Procedure: URETEROSCOPY WITH HOLMIUM LASER LITHOTRIPSY;  Surgeon: Nickie Retort, MD;  Location: [facility];  Service: Urology;  Laterality: Left;   URETEROSCOPY WITH HOLMIUM LASER LITHOTRIPSY Left 06/28/2015   Procedure: URETEROSCOPY WITH HOLMIUM LASER LITHOTRIPSY;  Surgeon: Hollice Espy, MD;  Location: [facility];  Service: Urology;  Laterality: Left;    SOCIAL HISTORY:   Social History   Tobacco Use   Smoking status: Never   Smokeless tobacco: Never  Substance Use Topics   Alcohol use: No    FAMILY HISTORY:   Family History  Problem Relation Age of Onset   Diabetes Mother    Hypertension Mother    Asthma Mother    Heart disease Father    Kidney disease Father    Diabetes Father    Stroke Father     DRUG ALLERGIES:   Allergies  Allergen Reactions   Hydromorphone Other (See Comments)    Severe hypotension    Aspirin     Hx GI bleed   Nsaids     Hx of GI bleed  Floxin [Ofloxacin] Rash    REVIEW OF SYSTEMS:   ROS As per history of present illness. All pertinent systems were reviewed above. Constitutional, HEENT, cardiovascular, respiratory, GI, GU, musculoskeletal, neuro, psychiatric, endocrine, integumentary and hematologic systems were reviewed and are otherwise negative/unremarkable except for positive findings mentioned above in the HPI.   MEDICATIONS AT HOME:   Prior to Admission medications   Medication Sig Start Date End Date Taking? Authorizing Provider  amLODipine (NORVASC) 5 MG tablet Take 5 mg by mouth 2 (two) times daily. 10/12/21  Yes [provider]  clopidogrel (PLAVIX) 75 MG tablet Take 75 mg by mouth daily. 03/27/21  Yes [provider]  furosemide (LASIX) 20 MG tablet Take 20 mg by mouth daily.   Yes [provider]  gabapentin (NEURONTIN) 400 MG capsule Take 400 mg by mouth 3 (three) times daily.   Yes [provider]  insulin  NPH-regular Human (70-30) 100 UNIT/ML injection Inject 55-60 Units into the skin See admin instructions. Inject 60 units in the morning and 55 units at night   Yes [provider]  losartan (COZAAR) 50 MG tablet Take 50 mg by mouth 2 (two) times daily. 10/12/21  Yes [provider]  metFORMIN (GLUCOPHAGE-XR) 500 MG 24 hr tablet Take 500-1,000 mg by mouth See admin instructions. Take 1000 mg in the morning and 500 mg in the evening 12/05/15  Yes [provider]  methocarbamol (ROBAXIN) 500 MG tablet Take 500 mg by mouth every 8 (eight) hours as needed for muscle spasms.   Yes [provider]  metoprolol succinate (TOPROL-XL) 25 MG 24 hr tablet Take 25 mg by mouth daily. 12/06/21  Yes [provider]  Multiple Vitamin (MULTIVITAMIN WITH MINERALS) TABS tablet Take 1 tablet by mouth daily.   Yes [provider]  omeprazole (PRILOSEC) 40 MG capsule Take 40 mg by mouth daily.   Yes [provider]  rosuvastatin (CRESTOR) 5 MG tablet Take 5 mg by mouth daily. 10/26/21  Yes [provider]  tiZANidine (ZANAFLEX) 4 MG capsule Take 4 mg by mouth 3 (three) times daily.   Yes [provider]  traMADol (ULTRAM) 50 MG tablet Take 50 mg by mouth every 6 (six) hours as needed for moderate pain. for pain   Yes [provider]  albuterol (VENTOLIN HFA) 108 (90 Base) MCG/ACT inhaler SMARTSIG:2 inhalation Via Inhaler Every 6 Hours PRN 10/20/21   [provider]  cetirizine (ZYRTEC) 10 MG tablet Take 10 mg by mouth daily as needed for allergies.    [provider]  lidocaine (XYLOCAINE) 5 % ointment Apply 1 application topically 2 (two) times daily. Patient not taking: Reported on 12/11/2021    [provider]  losartan (COZAAR) 25 MG tablet Take 25 mg by mouth daily. Patient not taking: Reported on 12/11/2021 12/08/21   [provider]  tadalafil (CIALIS) 20 MG tablet Take 1 tablet 1 hour prior to  intercourse. 04/14/21   Stoioff, Ronda Fairly, MD      VITAL SIGNS:  Blood pressure (!) 152/86, pulse 76, temperature 99.3 F (37.4 C), resp. rate 19, height '6\' 2"'$  (1.88 m), weight (!) 137 kg, SpO2 99 %.  PHYSICAL EXAMINATION:  Physical Exam  GENERAL:  61 y.o.-year-old Caucasian patient lying in the bed with no acute distress.  EYES: Pupils equal, round, reactive to light and accommodation. No scleral icterus. Extraocular muscles intact.  HEENT: Head atraumatic, normocephalic. Oropharynx and nasopharynx clear.  NECK:  Supple, no jugular venous distention.  No thyroid enlargement, no tenderness.  LUNGS: Normal breath sounds bilaterally, no wheezing, rales,rhonchi or crepitation. No use of accessory muscles of respiration.  CARDIOVASCULAR: Regular rate and rhythm, S1, S2 normal. No murmurs, rubs, or gallops.  ABDOMEN: Soft, nondistended, with right upper quadrant tenderness and mildly positive Murphy sign with no rebound tenderness guarding or rigidity. Bowel sounds present. No organomegaly or mass.  EXTREMITIES: No pedal edema, cyanosis, or clubbing.  NEUROLOGIC: Cranial nerves II through XII are intact. Muscle strength 5/5 in all extremities. Sensation intact. Gait not checked.  PSYCHIATRIC: The patient is alert and oriented x 3.  Normal affect and good eye contact. SKIN: No obvious rash, lesion, or ulcer.   LABORATORY PANEL:   CBC Recent Labs  Lab 12/11/21 1748  WBC 12.2*  HGB 14.6  HCT 43.7  PLT 307   ------------------------------------------------------------------------------------------------------------------  Chemistries  Recent Labs  Lab 12/11/21 1748  NA 138  K 4.4  CL 108  CO2 20*  GLUCOSE 172*  BUN 15  CREATININE 1.33*  CALCIUM 8.9  AST 27  ALT 26  ALKPHOS 66  BILITOT 1.5*   ------------------------------------------------------------------------------------------------------------------  Cardiac Enzymes No results for input(s): "TROPONINI" in the last 168  hours. ------------------------------------------------------------------------------------------------------------------  RADIOLOGY:  DG Chest Portable 1 View  Result Date: 12/11/2021 CLINICAL DATA:  Cough. EXAM: PORTABLE CHEST 1 VIEW COMPARISON:  Chest radiograph dated 04/02/2021. FINDINGS: No focal consolidation, pleural effusion, pneumothorax. The cardiac silhouette is within limits. No acute osseous pathology. Lower cervical ACDF. IMPRESSION: No active cardiopulmonary disease. Electronically Signed   By: Anner Crete M.D.   On: 12/11/2021 21:55   US Abdomen Limited RUQ (LIVER/GB)  Result Date: 12/11/2021 CLINICAL DATA:  Right upper quadrant abdominal pain. EXAM: ULTRASOUND ABDOMEN LIMITED RIGHT UPPER QUADRANT COMPARISON:  CT abdomen pelvis dated 12/11/2021 and ultrasound dated 04/15/2020. FINDINGS: Gallbladder: A 7 mm echogenic focus along the gallbladder wall with no posterior shadowing may represent an adherent sludge. A polyp is less likely. No shadowing stone. The gallbladder wall is thickened and cysts edematous measuring up to 6-7 mm in thickness. There is trace pericholecystic fluid. Positive sonographic Murphy's sign is reported. Common bile duct: Diameter: 7 mm Liver: There is diffuse increased liver echogenicity most commonly seen in the setting of fatty infiltration. Superimposed inflammation or fibrosis is not excluded. Clinical correlation is recommended. Portal vein is patent on color Doppler imaging with normal direction of blood flow towards the liver. Other: None. IMPRESSION: 1. Probable gallbladder sludge with thickened gallbladder wall and trace pericholecystic fluid. Findings may be related to underlying liver disease or represent acute cholecystitis. A hepatobiliary scintigraphy may provide better evaluation of the gallbladder if there is a high clinical concern for acute cholecystitis . 2. Fatty liver. Electronically Signed   By: Anner Crete M.D.   On: 12/11/2021 21:44    CT Abdomen Pelvis W Contrast  Result Date: 12/11/2021 CLINICAL DATA:  Abdomen pain history of nephrectomy EXAM: CT ABDOMEN AND PELVIS WITH CONTRAST TECHNIQUE: Multidetector CT imaging of the abdomen and pelvis was performed using the standard protocol following bolus administration of intravenous contrast. RADIATION DOSE REDUCTION: This exam was performed according to the departmental dose-optimization program which includes automated exposure control, adjustment of the mA and/or kV according to patient size and/or use of iterative reconstruction technique. CONTRAST:  119m OMNIPAQUE IOHEXOL 300 MG/ML  SOLN COMPARISON:  CT 05/10/2021, 04/05/2021, 03/06/2018 FINDINGS: Lower chest: Lung bases demonstrate no acute airspace disease. Calcified granuloma at the left lung base. Hepatobiliary: Hepatic steatosis. Right upper quadrant  soft tissue stranding and inflammatory process appears to be centered around the gallbladder. No calcified stone. No biliary dilatation. Pancreas: Unremarkable. No pancreatic ductal dilatation or surrounding inflammatory changes. Spleen: Normal in size without focal abnormality. Adrenals/Urinary Tract: Status post right nephrectomy. Adrenal glands are normal. Left kidney shows no hydronephrosis. Multiple left kidney stones measuring up to 6 mm. The bladder is unremarkable Stomach/Bowel: Stomach nonenlarged. No dilated small bowel. No acute bowel wall thickening. Negative appendix. Vascular/Lymphatic: Nonaneurysmal aorta.  No suspicious lymph nodes. Reproductive: Prostate is unremarkable. Other: Negative for pelvic effusion or free air. Fat containing right inguinal hernia Musculoskeletal: No acute osseous abnormality. IMPRESSION: 1. Right upper quadrant inflammatory process, appears to be centered around the gallbladder, raising concern for cholecystitis, suggest correlation with ultrasound. 2. Hepatic steatosis 3. Status post right nephrectomy. Left kidney stones without hydronephrosis  Electronically Signed   By: Donavan Foil M.D.   On: 12/11/2021 19:07      IMPRESSION AND PLAN:  Assessment and Plan: * Acute gastroenteritis - The patient will be admitted to a medical observation bed. - Pain management will be provided. - We will obtain stool studies and C. difficile. - The patient will be hydrated with IV normal saline.  Right upper quadrant pain - The patient has a gallbladder sludge and suspicious inflammation around the gallbladder and the abdominal CT scan. - Dr. Lysle Pearl was consulted and will follow HIDA scan in a.m. - We will keep the patient n.p.o. after midnight and hold off his Plavix pending HIDA scan results.  Type 2 diabetes mellitus with peripheral neuropathy (HCC) - The patient will be admitted placed on supplement coverage with NovoLog. - We will continue basal coverage. - We will hold off metformin and continue Jardiance. - We will continue Neurontin for peripheral neuropathy  Gastroesophageal reflux disease without esophagitis - We will continue PPI therapy.  Essential hypertension - We will continue his antihypertensives.       DVT prophylaxis: Lovenox.  Advanced Care Planning:  Code Status: full code.  Family Communication:  The plan of care was discussed in details with the patient (and family). I answered all questions. The patient agreed to proceed with the above mentioned plan. Further management will depend upon hospital course. Disposition Plan: Back to previous home environment Consults called: General surgery.   All the records are reviewed and case discussed with ED provider.  Status is: Observation   I certify that at the time of admission, it is my clinical judgment that the patient will require hospital care extending less than 2 midnights.                            Dispo: The patient is from: Home              Anticipated d/c is to: Home              Patient currently is not medically stable to d/c.               Difficult to place patient: No  Christel Mormon M.D on 12/12/2021 at 2:36 AM  [facility]   From 7 PM-7 AM, contact night-coverage [email or web address]  CC: Primary care physician; Valera Castle, MD

## 2021-12-12 NOTE — Progress Notes (Signed)
       CROSS COVER NOTE  NAME: Anthony Fuller MRN: [id number] DOB : [birth year]    Date of Service   12/12/2021  HPI/Events of Note    Contacted by nursing with concern that Mr Limas is ordered home 55U 70/30 insulin while NPO. Current CBG 123.  Interventions   Plan: 40U 70/30 (25% dose reduction) q4H POCT CBG monitoring while NPO      This document was prepared using Dragon voice recognition software and may include unintentional dictation errors.  Neomia Glass DNP, MHA, FNP-BC Nurse Practitioner [facility] Pager [phone]

## 2021-12-12 NOTE — Progress Notes (Signed)
Patient admitted early hours of the morning with acute gastroenteritis ongoing for about 7 to 10 days. Also had some abdominal pain and high-grade fever of 102 at home. Patient was found to have Campylobacter on stool PCR study. C. diff negative. Given his fever and abdominal pain treating with IV fluoroquinolones. Ultrasound abdomen positive for acute cholecystitis. HIDA scan pending. Dr. Lysle Pearl surgery consulted. Continue IV fluids. Follow-up surgery recommendations.  25 mins, no charge

## 2021-12-12 NOTE — Assessment & Plan Note (Signed)
-   We will continue his antihypertensives. 

## 2021-12-12 NOTE — Inpatient Diabetes Management (Signed)
Inpatient Diabetes Program Recommendations  AACE/ADA: New Consensus Statement on Inpatient Glycemic Control (2015)  Target Ranges:  Prepandial:   less than 140 mg/dL      Peak postprandial:   less than 180 mg/dL (1-2 hours)      Critically ill patients:  140 - 180 mg/dL    Latest Reference Range & Units 12/12/21 00:47 12/12/21 03:50 12/12/21 08:37  Glucose-Capillary 70 - 99 mg/dL 123 (H) 144 (H) 128 (H)  (H): Data is abnormally high    Admit with:  Acute gastroenteritis  Campylobacter on stool PCR study Acute cholecystitis  History: DM2  Home DM Meds: 70/30 Insulin 60 units AM/ 55 units PM      Metformin 1000 mg AM/ 500 mg PM  Current Orders: Novolog Sensitive Correction Scale/ SSI (0-9 units) TID AC + HS    Current A1c Pending  Recommend changing frequency of Novolog SSI to Q4 hours.  Will follow   --Will follow patient during hospitalization--  Wyn Quaker RN, MSN, [place] Diabetes Coordinator Inpatient Glycemic Control Team Team Pager: [phone] (8a-5p)

## 2021-12-12 NOTE — Assessment & Plan Note (Signed)
-   The patient will be admitted to a medical observation bed. - Pain management will be provided. - We will obtain stool studies and C. difficile. - The patient will be hydrated with IV normal saline.

## 2021-12-12 NOTE — Assessment & Plan Note (Signed)
-   The patient will be admitted placed on supplement coverage with NovoLog. - We will continue basal coverage. - We will hold off metformin and continue Jardiance. - We will continue Neurontin for peripheral neuropathy

## 2021-12-13 DIAGNOSIS — K529 Noninfective gastroenteritis and colitis, unspecified: Secondary | ICD-10-CM | POA: Diagnosis not present

## 2021-12-13 LAB — GLUCOSE, CAPILLARY
Glucose-Capillary: 103 mg/dL — ABNORMAL HIGH (ref 70–99)
Glucose-Capillary: 173 mg/dL — ABNORMAL HIGH (ref 70–99)
Glucose-Capillary: 175 mg/dL — ABNORMAL HIGH (ref 70–99)
Glucose-Capillary: 178 mg/dL — ABNORMAL HIGH (ref 70–99)
Glucose-Capillary: 179 mg/dL — ABNORMAL HIGH (ref 70–99)
Glucose-Capillary: 208 mg/dL — ABNORMAL HIGH (ref 70–99)
Glucose-Capillary: 213 mg/dL — ABNORMAL HIGH (ref 70–99)
Glucose-Capillary: 241 mg/dL — ABNORMAL HIGH (ref 70–99)
Glucose-Capillary: 253 mg/dL — ABNORMAL HIGH (ref 70–99)

## 2021-12-13 LAB — POTASSIUM: Potassium: 3.5 mmol/L (ref 3.5–5.1)

## 2021-12-13 MED ORDER — ENOXAPARIN SODIUM 80 MG/0.8ML IJ SOSY
0.5000 mg/kg | PREFILLED_SYRINGE | INTRAMUSCULAR | Status: DC
  Administered 2021-12-13: 67.5 mg via SUBCUTANEOUS
  Filled 2021-12-13 (×2): qty 0.68

## 2021-12-13 NOTE — Consult Note (Signed)
ANTICOAGULATION CONSULT NOTE - Initial Consult  Pharmacy Consult for enoxaparin Indication: VTE prophylaxis  Allergies  Allergen Reactions   Hydromorphone Other (See Comments)    Severe hypotension    Aspirin     Hx GI bleed   Nsaids     Hx of GI bleed   Floxin [Ofloxacin] Rash    Patient Measurements: Height: '6\' 2"'$  (188 cm) Weight: (!) 137 kg (302 lb 0.5 oz) IBW/kg (Calculated) : 82.2  Vital Signs: Temp: 98.9 F (37.2 C) (08/02 0833) Temp Source: Oral (08/02 0833) BP: 125/67 (08/02 0833) Pulse Rate: 68 (08/02 0833)  Labs: Recent Labs    12/11/21 1748 12/12/21 0550  HGB 14.6 14.0  HCT 43.7 41.3  PLT 307 248  CREATININE 1.33* 1.31*  TROPONINIHS 10  --     Estimated Creatinine Clearance: 89.4 mL/min (A) (by C-G formula based on SCr of 1.31 mg/dL (H)).   Medical History: Past Medical History:  Diagnosis Date   Acid indigestion 02/14/2012   Anxiety    Arthritis    BIL.KNEES   Bulge of lumbar disc without myelopathy 09/05/2011   Cancer ([place]) 2012   R kidney removed   Cervical stenosis of spine    Cervicalgia    Chest wall pain    Clostridium difficile infection 02/04/2014   DDD (degenerative disc disease), lumbar    DDD (degenerative disc disease), lumbar    Degeneration of intervertebral disc of lumbar region 08/23/2011   Diabetes mellitus ([place]) 07/24/2011   Diabetes mellitus without complication ([place])    Diabetic polyneuropathy associated with type 2 diabetes mellitus ([place])    Diarrhea    DM (diabetes mellitus) type 2, uncontrolled, with ketoacidosis ([place]) 05/30/2015   Dyspepsia    Enthesopathy of hip 04/11/2015   Fatty liver disease, nonalcoholic 12/18/5641   GERD (gastroesophageal reflux disease)    Headache    Hepatic cirrhosis (HCC) 09/12/2014   Hydrocele    Hydrocele    Hypertension    IBS (irritable bowel syndrome)    Lumbar herniated disc    Lumbar radiculopathy 09/05/2011   Lumbar stenosis    Peroneal tendon injury, right, initial encounter     Peroneal tendon tear    Radiculopathy    Renal insufficiency    Right Nephrectomy due to Ron[place].   Sleep apnea    snores   Stenosis of cervical spine    Thoracic and lumbosacral neuritis 07/24/2011   Thrombocytopathia (HCC)    Thrombocytopenia (HCC)     Medications:  Medications Prior to Admission  Medication Sig Dispense Refill Last Dose   amLODipine (NORVASC) 5 MG tablet Take 5 mg by mouth 2 (two) times daily.   12/11/2021   clopidogrel (PLAVIX) 75 MG tablet Take 75 mg by mouth daily.   12/11/2021   furosemide (LASIX) 20 MG tablet Take 20 mg by mouth daily.   12/11/2021   gabapentin (NEURONTIN) 400 MG capsule Take 400 mg by mouth 3 (three) times daily.   12/11/2021   insulin NPH-regular Human (70-30) 100 UNIT/ML injection Inject 55-60 Units into the skin See admin instructions. Inject 60 units in the morning and 55 units at night   12/11/2021   losartan (COZAAR) 50 MG tablet Take 50 mg by mouth 2 (two) times daily.   12/11/2021   metFORMIN (GLUCOPHAGE-XR) 500 MG 24 hr tablet Take 500-1,000 mg by mouth See admin instructions. Take 1000 mg in the morning and 500 mg in the evening   12/11/2021   methocarbamol (ROBAXIN) 500 MG tablet  Take 500 mg by mouth every 8 (eight) hours as needed for muscle spasms.   prn at prn   metoprolol succinate (TOPROL-XL) 25 MG 24 hr tablet Take 25 mg by mouth daily.   12/11/2021   Multiple Vitamin (MULTIVITAMIN WITH MINERALS) TABS tablet Take 1 tablet by mouth daily.   12/11/2021   omeprazole (PRILOSEC) 40 MG capsule Take 40 mg by mouth daily.   12/11/2021   rosuvastatin (CRESTOR) 5 MG tablet Take 5 mg by mouth daily.   12/11/2021   tiZANidine (ZANAFLEX) 4 MG capsule Take 4 mg by mouth 3 (three) times daily.   12/11/2021   traMADol (ULTRAM) 50 MG tablet Take 50 mg by mouth every 6 (six) hours as needed for moderate pain. for pain  1 12/11/2021   albuterol (VENTOLIN HFA) 108 (90 Base) MCG/ACT inhaler SMARTSIG:2 inhalation Via Inhaler Every 6 Hours PRN   prn at prn    cetirizine (ZYRTEC) 10 MG tablet Take 10 mg by mouth daily as needed for allergies.   prn at prn   lidocaine (XYLOCAINE) 5 % ointment Apply 1 application topically 2 (two) times daily. (Patient not taking: Reported on 12/11/2021)   Not Taking   losartan (COZAAR) 25 MG tablet Take 25 mg by mouth daily. (Patient not taking: Reported on 12/11/2021)   Not Taking   tadalafil (CIALIS) 20 MG tablet Take 1 tablet 1 hour prior to intercourse. 10 tablet 0 prn at prn   Scheduled:   gabapentin  400 mg Oral TID   insulin aspart  0-9 Units Subcutaneous Q4H   levofloxacin  750 mg Oral Daily   lidocaine  1 Application Topical BID   metoprolol succinate  25 mg Oral Daily   multivitamin with minerals  1 tablet Oral Daily   pantoprazole  40 mg Oral Daily   rosuvastatin  5 mg Oral Daily   tiZANidine  4 mg Oral TID   Infusions:   sodium chloride 100 mL/hr at 12/13/21 0013   PRN: acetaminophen **OR** acetaminophen, albuterol, guaiFENesin-dextromethorphan, loratadine, ondansetron **OR** ondansetron (ZOFRAN) IV, traMADol, traZODone  Assessment: Patient is a 61 YO male with PMH significant for GERD, T2DM with peripheral neuropathy, OSA, DDD and renal cancer s/p right nephrectomy, who presented with a Kalisetti intractable abdominal pain with diarrhea x10 days  and nausea without vomiting. Pharmacy has been consulted to dose enoxaparin for VTE prophylaxis.   Goal of Therapy:  Monitor platelets by anticoagulation protocol: Yes   Plan:  Initiate enoxaparin at 67.5 mg SQ daily. Follow up CBC at least every 3 days.  Gretel Acre, PharmD PGY1 Pharmacy Resident 12/13/2021 3:24 PM

## 2021-12-13 NOTE — Progress Notes (Signed)
At 2345, patient called to inform this nurse that his legs felt like they are bricks, noted warmth on right lower extremity, sensation cannot be assessed as patient has neuropathy, informed Calla Kicks NP, to continue to monitor for now.

## 2021-12-13 NOTE — Progress Notes (Signed)
PROGRESS NOTE    Anthony Fuller  YBO:[id number] DOB: 1961/05/01 DOA: 12/11/2021 PCP: Valera Castle, MD    Brief Narrative:  Anthony Fuller is a 61 y.o. Caucasian male with medical history significant for GERD, type 2 diabetes mellitus with peripheral neuropathy, OSA, DDD and renal cancer status post right nephrectomy, who presented to the emergency room with a Kalisetti intractable abdominal pain with associated diarrhea over the last 10 days as well as nausea without vomiting.  He had a fever of 101-102 at home with occasional chills.   8/2 still with diarrhea. Abd pain + Stool panel + Campylobacter  Consultants:  gsx  Procedures:   Antimicrobials:  Levaquin   Subjective: No n/v  Objective: Vitals:   12/12/21 1702 12/12/21 1946 12/13/21 0346 12/13/21 0833  BP: (!) 101/59 133/76 122/69 125/67  Pulse: (!) 58 62 67 68  Resp: '18 20 20 18  '$ Temp: 97.8 F (36.6 C) 98.3 F (36.8 C) 98.1 F (36.7 C) 98.9 F (37.2 C)  TempSrc: Oral Oral Oral Oral  SpO2: 94% 98% 96% 97%  Weight:      Height:        Intake/Output Summary (Last 24 hours) at 12/13/2021 0837 Last data filed at 12/13/2021 0600 Gross per 24 hour  Intake 2008.07 ml  Output --  Net 2008.07 ml   Filed Weights   12/11/21 1735  Weight: (!) 137 kg    Examination: Calm, NAD Cta no w/r Reg s1/s2 no gallop Soft  mild ttp diffusely+bs No edema Aaoxox3  Mood and affect appropriate in current setting     Data Reviewed: I have personally reviewed following labs and imaging studies  CBC: Recent Labs  Lab 12/11/21 1748 12/12/21 0550  WBC 12.2* 8.8  NEUTROABS 9.4*  --   HGB 14.6 14.0  HCT 43.7 41.3  MCV 85.9 86.0  PLT 307 277   Basic Metabolic Panel: Recent Labs  Lab 12/11/21 1748 12/12/21 0550  NA 138 138  K 4.4 3.3*  CL 108 108  CO2 20* 23  GLUCOSE 172* 118*  BUN 15 16  CREATININE 1.33* 1.31*  CALCIUM 8.9 8.5*   GFR: Estimated Creatinine Clearance: 89.4 mL/min (A) (by C-G formula  based on SCr of 1.31 mg/dL (H)). Liver Function Tests: Recent Labs  Lab 12/11/21 1748  AST 27  ALT 26  ALKPHOS 66  BILITOT 1.5*  PROT 8.3*  ALBUMIN 3.7   Recent Labs  Lab 12/11/21 1748  LIPASE 36   No results for input(s): "AMMONIA" in the last 168 hours. Coagulation Profile: No results for input(s): "INR", "PROTIME" in the last 168 hours. Cardiac Enzymes: No results for input(s): "CKTOTAL", "CKMB", "CKMBINDEX", "TROPONINI" in the last 168 hours. BNP (last 3 results) No results for input(s): "PROBNP" in the last 8760 hours. HbA1C: Recent Labs    12/12/21 0550  HGBA1C 7.6*   CBG: Recent Labs  Lab 12/12/21 1701 12/12/21 1946 12/12/21 2130 12/13/21 0019 12/13/21 0345  GLUCAP 253* 241* 201* 173* 175*   Lipid Profile: No results for input(s): "CHOL", "HDL", "LDLCALC", "TRIG", "CHOLHDL", "LDLDIRECT" in the last 72 hours. Thyroid Function Tests: No results for input(s): "TSH", "T4TOTAL", "FREET4", "T3FREE", "THYROIDAB" in the last 72 hours. Anemia Panel: No results for input(s): "VITAMINB12", "FOLATE", "FERRITIN", "TIBC", "IRON", "RETICCTPCT" in the last 72 hours. Sepsis Labs: No results for input(s): "PROCALCITON", "LATICACIDVEN" in the last 168 hours.  Recent Results (from the past 240 hour(s))  SARS Coronavirus 2 by RT PCR (hospital order, performed in  [facility] hospital lab) *cepheid single result test* Anterior Nasal Swab     Status: None   Collection Time: 12/11/21 10:15 PM   Specimen: Anterior Nasal Swab  Result Value Ref Range Status   SARS Coronavirus 2 by RT PCR NEGATIVE NEGATIVE Final    Comment: (NOTE) SARS-CoV-2 target nucleic acids are NOT DETECTED.  The SARS-CoV-2 RNA is generally detectable in upper and lower respiratory specimens during the acute phase of infection. The lowest concentration of SARS-CoV-2 viral copies this assay can detect is 250 copies / mL. A negative result does not preclude SARS-CoV-2 infection and should not be used as the  sole basis for treatment or other patient management decisions.  A negative result may occur with improper specimen collection / handling, submission of specimen other than nasopharyngeal swab, presence of viral mutation(s) within the areas targeted by this assay, and inadequate number of viral copies (<250 copies / mL). A negative result must be combined with clinical observations, patient history, and epidemiological information.  Fact Sheet for Patients:   https://www.patel.info/  Fact Sheet for Healthcare Providers: https://hall.com/  This test is not yet approved or  cleared by the [place] FDA and has been authorized for detection and/or diagnosis of SARS-CoV-2 by FDA under an Emergency Use Authorization (EUA).  This EUA will remain in effect (meaning this test can be used) for the duration of the COVID-19 declaration under Section 564(b)(1) of the Act, 21 U.S.C. section 360bbb-3(b)(1), unless the authorization is terminated or revoked sooner.  Performed at [facility], [address] [place], [place] [address]   Gastrointestinal Panel by PCR , Stool     Status: Abnormal   Collection Time: 12/12/21  1:43 AM   Specimen: Stool  Result Value Ref Range Status   Campylobacter species DETECTED (A) NOT DETECTED Final    Comment: RESULT CALLED TO, READ BACK BY AND VERIFIED WITH: FADILA BILAL'@0406'$  12/12/21 RH    Plesimonas shigelloides NOT DETECTED NOT DETECTED Final   Salmonella species NOT DETECTED NOT DETECTED Final   Yersinia enterocolitica NOT DETECTED NOT DETECTED Final   Vibrio species NOT DETECTED NOT DETECTED Final   Vibrio cholerae NOT DETECTED NOT DETECTED Final   Enteroaggregative E coli (EAEC) NOT DETECTED NOT DETECTED Final   Enteropathogenic E coli (EPEC) NOT DETECTED NOT DETECTED Final   Enterotoxigenic E coli (ETEC) NOT DETECTED NOT DETECTED Final   Shiga like toxin producing E coli (STEC) NOT DETECTED NOT  DETECTED Final   Shigella/Enteroinvasive E coli (EIEC) NOT DETECTED NOT DETECTED Final   Cryptosporidium NOT DETECTED NOT DETECTED Final   Cyclospora cayetanensis NOT DETECTED NOT DETECTED Final   Entamoeba histolytica NOT DETECTED NOT DETECTED Final   Giardia lamblia NOT DETECTED NOT DETECTED Final   Adenovirus F40/41 NOT DETECTED NOT DETECTED Final   Astrovirus NOT DETECTED NOT DETECTED Final   Norovirus GI/GII NOT DETECTED NOT DETECTED Final   Rotavirus A NOT DETECTED NOT DETECTED Final   Sapovirus (I, II, IV, and V) NOT DETECTED NOT DETECTED Final    Comment: Performed at [facility], [address] [place], [place] [address]  C Difficile Quick Screen w PCR reflex     Status: None   Collection Time: 12/12/21  1:43 AM   Specimen: STOOL  Result Value Ref Range Status   C Diff antigen NEGATIVE NEGATIVE Final   C Diff toxin NEGATIVE NEGATIVE Final   C Diff interpretation No C. difficile detected.  Final    Comment: Performed at [facility], [address] [place], [place] [address]         Radiology Studies: NM Hepato W/EF  Result Date: 12/12/2021 CLINICAL DATA:  RIGHT upper quadrant pain. EXAM: NUCLEAR MEDICINE HEPATOBILIARY IMAGING TECHNIQUE: Sequential images of the abdomen were obtained out to 60 minutes following intravenous administration of radiopharmaceutical. RADIOPHARMACEUTICALS:  4.9 mCi Tc-68m Choletec IV COMPARISON:  CT 12/11/2021 FINDINGS: Prompt clearance radiotracer from blood pool and homogeneous uptake in liver. Counts are present in the small bowel by 25 minutes. At 50 minutes, the gallbladder begins to fill. IMPRESSION: 1. Patent cystic duct with filling of the gallbladder. 2. Patent common bile duct. Electronically Signed   By: SSuzy BouchardM.D.   On: 12/12/2021 14:38   DG Chest Portable 1 View  Result Date: 12/11/2021 CLINICAL DATA:  Cough. EXAM: PORTABLE CHEST 1 VIEW COMPARISON:  Chest radiograph dated 04/02/2021. FINDINGS: No focal  consolidation, pleural effusion, pneumothorax. The cardiac silhouette is within limits. No acute osseous pathology. Lower cervical ACDF. IMPRESSION: No active cardiopulmonary disease. Electronically Signed   By: AAnner CreteM.D.   On: 12/11/2021 21:55   [place]Abdomen Limited RUQ (LIVER/GB)  Result Date: 12/11/2021 CLINICAL DATA:  Right upper quadrant abdominal pain. EXAM: ULTRASOUND ABDOMEN LIMITED RIGHT UPPER QUADRANT COMPARISON:  CT abdomen pelvis dated 12/11/2021 and ultrasound dated 04/15/2020. FINDINGS: Gallbladder: A 7 mm echogenic focus along the gallbladder wall with no posterior shadowing may represent an adherent sludge. A polyp is less likely. No shadowing stone. The gallbladder wall is thickened and cysts edematous measuring up to 6-7 mm in thickness. There is trace pericholecystic fluid. Positive sonographic Murphy's sign is reported. Common bile duct: Diameter: 7 mm Liver: There is diffuse increased liver echogenicity most commonly seen in the setting of fatty infiltration. Superimposed inflammation or fibrosis is not excluded. Clinical correlation is recommended. Portal vein is patent on color Doppler imaging with normal direction of blood flow towards the liver. Other: None. IMPRESSION: 1. Probable gallbladder sludge with thickened gallbladder wall and trace pericholecystic fluid. Findings may be related to underlying liver disease or represent acute cholecystitis. A hepatobiliary scintigraphy may provide better evaluation of the gallbladder if there is a high clinical concern for acute cholecystitis . 2. Fatty liver. Electronically Signed   By: AAnner CreteM.D.   On: 12/11/2021 21:44   CT Abdomen Pelvis W Contrast  Result Date: 12/11/2021 CLINICAL DATA:  Abdomen pain history of nephrectomy EXAM: CT ABDOMEN AND PELVIS WITH CONTRAST TECHNIQUE: Multidetector CT imaging of the abdomen and pelvis was performed using the standard protocol following bolus administration of intravenous  contrast. RADIATION DOSE REDUCTION: This exam was performed according to the departmental dose-optimization program which includes automated exposure control, adjustment of the mA and/or kV according to patient size and/or use of iterative reconstruction technique. CONTRAST:  1039mOMNIPAQUE IOHEXOL 300 MG/ML  SOLN COMPARISON:  CT 05/10/2021, 04/05/2021, 03/06/2018 FINDINGS: Lower chest: Lung bases demonstrate no acute airspace disease. Calcified granuloma at the left lung base. Hepatobiliary: Hepatic steatosis. Right upper quadrant soft tissue stranding and inflammatory process appears to be centered around the gallbladder. No calcified stone. No biliary dilatation. Pancreas: Unremarkable. No pancreatic ductal dilatation or surrounding inflammatory changes. Spleen: Normal in size without focal abnormality. Adrenals/Urinary Tract: Status post right nephrectomy. Adrenal glands are normal. Left kidney shows no hydronephrosis. Multiple left kidney stones measuring up to 6 mm. The bladder is unremarkable Stomach/Bowel: Stomach nonenlarged. No dilated small bowel. No acute bowel wall thickening. Negative appendix. Vascular/Lymphatic: Nonaneurysmal aorta.  No suspicious lymph nodes. Reproductive: Prostate is unremarkable. Other: Negative for pelvic effusion or free air. Fat containing right inguinal hernia Musculoskeletal: No acute osseous abnormality. IMPRESSION: 1. Right upper quadrant inflammatory process, appears to be centered around the gallbladder, raising concern for cholecystitis, suggest correlation with ultrasound. 2. Hepatic steatosis 3. Status post right nephrectomy. Left kidney stones without hydronephrosis Electronically Signed   By: Donavan Foil M.D.   On: 12/11/2021 19:07        Scheduled Meds:  amLODipine  5 mg Oral BID   gabapentin  400 mg Oral TID   insulin aspart  0-9 Units Subcutaneous Q4H   levofloxacin  750 mg Oral Daily   lidocaine  1 Application Topical BID   metoprolol succinate  25  mg Oral Daily   multivitamin with minerals  1 tablet Oral Daily   pantoprazole  40 mg Oral Daily   rosuvastatin  5 mg Oral Daily   tiZANidine  4 mg Oral TID   Continuous Infusions:  sodium chloride 100 mL/hr at 12/13/21 0013    Assessment & Plan:   Principal Problem:   Acute gastroenteritis Active Problems:   Right upper quadrant pain   Type 2 diabetes mellitus with peripheral neuropathy (HCC)   Essential hypertension   Gastroesophageal reflux disease without esophagitis   Acute gastroenteritis Still with diarrhea and abdominal pain C. difficile negative GI panel positive for Campylobacter Continue levaquin Continue ivf   Right upper quadrant pain - The patient has a gallbladder sludge and suspicious inflammation around the gallbladder and the abdominal CT scan. 8/2 surgery input appreciated HIDA negative. Surgery signed off    Type 2 diabetes mellitus with peripheral neuropathy (HCC) Bg stable Continue riss   Gastroesophageal reflux disease without esophagitis Continue PPI   Essential hypertension Bp stable , but since on ivf , will hold amlodipine for now so bp doesn't drop when dc ivf.    DVT prophylaxis: Lovenox Code Status: Full Family Communication: None at bedside Disposition Plan: Back home Status is: Inpatient Remains inpatient appropriate because: IV treatment.        LOS: 1 day   Time spent: 35 minutes    Nolberto Hanlon, MD [facility] Pager [phone]  If 7PM-7AM, please contact night-coverage 12/13/2021, 8:37 AM

## 2021-12-14 ENCOUNTER — Inpatient Hospital Stay: Payer: Medicare HMO

## 2021-12-14 DIAGNOSIS — K529 Noninfective gastroenteritis and colitis, unspecified: Secondary | ICD-10-CM | POA: Diagnosis not present

## 2021-12-14 LAB — BASIC METABOLIC PANEL
Anion gap: 6 (ref 5–15)
BUN: 18 mg/dL (ref 6–20)
CO2: 23 mmol/L (ref 22–32)
Calcium: 8.5 mg/dL — ABNORMAL LOW (ref 8.9–10.3)
Chloride: 112 mmol/L — ABNORMAL HIGH (ref 98–111)
Creatinine, Ser: 1.17 mg/dL (ref 0.61–1.24)
GFR, Estimated: >60 mL/min (ref >60)
Glucose, Bld: 122 mg/dL — ABNORMAL HIGH (ref 70–99)
Potassium: 4 mmol/L (ref 3.5–5.1)
Sodium: 141 mmol/L (ref 135–145)

## 2021-12-14 LAB — GLUCOSE, CAPILLARY
Glucose-Capillary: 128 mg/dL — ABNORMAL HIGH (ref 70–99)
Glucose-Capillary: 94 mg/dL (ref 70–99)
Glucose-Capillary: 155 mg/dL — ABNORMAL HIGH (ref 70–99)
Glucose-Capillary: 174 mg/dL — ABNORMAL HIGH (ref 70–99)

## 2021-12-14 MED ORDER — LEVOFLOXACIN 750 MG PO TABS: 750.0000 mg | ORAL_TABLET | Freq: Every day | ORAL | 0 refills | Status: AC

## 2021-12-14 NOTE — Plan of Care (Signed)
  Problem: Education: Goal: Knowledge of General Education information will improve Description: Including pain rating scale, medication(s)/side effects and non-pharmacologic comfort measures 12/14/2021 1605 by Evelena Peat, RN Outcome: Completed/Met 12/14/2021 1037 by Evelena Peat, RN Outcome: Progressing   Problem: Health Behavior/Discharge Planning: Goal: Ability to manage health-related needs will improve 12/14/2021 1605 by Evelena Peat, RN Outcome: Completed/Met 12/14/2021 1037 by Evelena Peat, RN Outcome: Progressing   Problem: Clinical Measurements: Goal: Ability to maintain clinical measurements within normal limits will improve 12/14/2021 1605 by Evelena Peat, RN Outcome: Completed/Met 12/14/2021 1037 by Evelena Peat, RN Outcome: Progressing Goal: Will remain free from infection 12/14/2021 1605 by Evelena Peat, RN Outcome: Completed/Met 12/14/2021 1037 by Evelena Peat, RN Outcome: Progressing Goal: Diagnostic test results will improve 12/14/2021 1605 by Evelena Peat, RN Outcome: Completed/Met 12/14/2021 1037 by Evelena Peat, RN Outcome: Progressing Goal: Respiratory complications will improve 12/14/2021 1605 by Evelena Peat, RN Outcome: Completed/Met 12/14/2021 1037 by Evelena Peat, RN Outcome: Progressing Goal: Cardiovascular complication will be avoided 12/14/2021 1605 by Evelena Peat, RN Outcome: Completed/Met 12/14/2021 1037 by Evelena Peat, RN Outcome: Progressing

## 2021-12-14 NOTE — Discharge Summary (Signed)
CAMARI WISHAM KXF:[id number] DOB: 18-Dec-1960 DOA: 12/11/2021  PCP: Valera Castle, MD  Admit date: 12/11/2021 Discharge date: 12/14/2021  Admitted From: home Disposition:  home  Recommendations for Outpatient Follow-up:  Follow up with PCP in 1 week Please obtain BMP/CBC in one week      Discharge Condition:Stable CODE STATUS:full  Diet recommendation: Heart Healthy / Carb Modified  Brief/Interim Summary: Per HPI: DOMANI BAKOS is a 61 y.o. Caucasian male with medical history significant for GERD, type 2 diabetes mellitus with peripheral neuropathy, OSA, DDD and renal cancer status post right nephrectomy, who presented to the emergency room with a [place] intractable abdominal pain with associated diarrhea over the last 10 days as well as nausea without vomiting.  He had a fever of 101-102 at home with occasional chills. Abdominal pelvic CT scan revealed the following: 1. Right upper quadrant inflammatory process, appears to be centered around the gallbladder, raising concern for cholecystitis, suggest correlation with ultrasound. 2. Hepatic steatosis 3. Status post right nephrectomy. Left kidney stones without hydronephrosis. Right upper quadrant ultrasound revealed the following: 1. Probable gallbladder sludge with thickened gallbladder wall and trace pericholecystic fluid. Findings may be related to underlying liver disease or represent acute cholecystitis. A hepatobiliary scintigraphy may provide better evaluation of the gallbladder if there is a high clinical concern for acute cholecystitis . 2. Fatty liver  Stool panel + Campylobacter.  Patient was admitted . General surgery consulted for Ct findings. Started on antibiotics for his diarrhea and IVF. Today, he complained of b/l leg heaviness/pain. Venous US obtained which was negative for DVT. He is stable for discharge.   Acute gastroenteritis C. difficile negative GI panel positive for Campylobacter Was  started on Levaquin and IV fluids Diarrhea improving    Right upper quadrant pain - The patient has a gallbladder sludge and suspicious inflammation around the gallbladder and the abdominal CT scan. Surgery consulted HIDA negative. Surgery signed off     Type 2 diabetes mellitus with peripheral neuropathy (HCC) Continue home meds     Gastroesophageal reflux disease without esophagitis Continue PPI   Essential hypertension His bp was on low side due to diarrhea and his medications were held  told patient to check bp at home once it rises to 696'V systolic , should resume meds. F/u with pcp for med adjustment      Discharge Diagnoses:  Principal Problem:   Acute gastroenteritis Active Problems:   Right upper quadrant pain   Type 2 diabetes mellitus with peripheral neuropathy (HCC)   Essential hypertension   Gastroesophageal reflux disease without esophagitis    Discharge Instructions  Discharge Instructions     Call MD for:  severe uncontrolled pain   Complete by: As directed    Diet - low sodium heart healthy   Complete by: As directed    Discharge instructions   Complete by: As directed    F/u with pcp Take your antibiotic starting tomorrow at [place] F/u with vascular doctor Dr. Delana Meyer   Increase activity slowly   Complete by: As directed    No wound care   Complete by: As directed       Allergies as of 12/14/2021       Reactions   Hydromorphone Other (See Comments)   Severe hypotension   Aspirin    Hx GI bleed   Nsaids    Hx of GI bleed   Floxin [ofloxacin] Rash        Medication List  STOP taking these medications    amLODipine 5 MG tablet Commonly known as: NORVASC   furosemide 20 MG tablet Commonly known as: LASIX   losartan 25 MG tablet Commonly known as: COZAAR   losartan 50 MG tablet Commonly known as: COZAAR   tadalafil 20 MG tablet Commonly known as: CIALIS       TAKE these medications    albuterol 108  (90 Base) MCG/ACT inhaler Commonly known as: VENTOLIN HFA SMARTSIG:2 inhalation Via Inhaler Every 6 Hours PRN   cetirizine 10 MG tablet Commonly known as: ZYRTEC Take 10 mg by mouth daily as needed for allergies.   clopidogrel 75 MG tablet Commonly known as: PLAVIX Take 75 mg by mouth daily.   gabapentin 400 MG capsule Commonly known as: NEURONTIN Take 400 mg by mouth 3 (three) times daily.   insulin NPH-regular Human (70-30) 100 UNIT/ML injection Inject 55-60 Units into the skin See admin instructions. Inject 60 units in the morning and 55 units at night   levofloxacin 750 MG tablet Commonly known as: LEVAQUIN Take 1 tablet (750 mg total) by mouth daily for 2 days. Start taking on: December 15, 2021   lidocaine 5 % ointment Commonly known as: XYLOCAINE Apply 1 application topically 2 (two) times daily.   metFORMIN 500 MG 24 hr tablet Commonly known as: GLUCOPHAGE-XR Take 500-1,000 mg by mouth See admin instructions. Take 1000 mg in the morning and 500 mg in the evening   methocarbamol 500 MG tablet Commonly known as: ROBAXIN Take 500 mg by mouth every 8 (eight) hours as needed for muscle spasms.   metoprolol succinate 25 MG 24 hr tablet Commonly known as: TOPROL-XL Take 25 mg by mouth daily.   multivitamin with minerals Tabs tablet Take 1 tablet by mouth daily.   omeprazole 40 MG capsule Commonly known as: PRILOSEC Take 40 mg by mouth daily.   rosuvastatin 5 MG tablet Commonly known as: CRESTOR Take 5 mg by mouth daily.   tiZANidine 4 MG capsule Commonly known as: ZANAFLEX Take 4 mg by mouth 3 (three) times daily.   traMADol 50 MG tablet Commonly known as: ULTRAM Take 50 mg by mouth every 6 (six) hours as needed for moderate pain. for pain        Follow-up Information     Kym Groom Guy Begin, MD Follow up in 1 week(s).   Specialty: Family Medicine Contact information: [place] [address] [phone]         Katha Cabal, MD Follow up in 2 week(s).   Specialties: Vascular Surgery, Cardiology, Radiology, Vascular Surgery Contact information: [place] [address] [phone]                Allergies  Allergen Reactions   Hydromorphone Other (See Comments)    Severe hypotension    Aspirin     Hx GI bleed   Nsaids     Hx of GI bleed   Floxin [Ofloxacin] Rash    Consultations: Surgery   Procedures/Studies: US Venous Img Lower Bilateral (DVT)  Result Date: 12/14/2021 CLINICAL DATA:  61 year old male with a history of pain EXAM: BILATERAL LOWER EXTREMITY VENOUS DOPPLER ULTRASOUND TECHNIQUE: Gray-scale sonography with graded compression, as well as color Doppler and duplex ultrasound were performed to evaluate the lower extremity deep venous systems from the level of the common femoral vein and including the common femoral, femoral, profunda femoral, popliteal and calf veins including the posterior tibial, peroneal and gastrocnemius veins when  visible. The superficial great saphenous vein was also interrogated. Spectral Doppler was utilized to evaluate flow at rest and with distal augmentation maneuvers in the common femoral, femoral and popliteal veins. COMPARISON:  None Available. FINDINGS: RIGHT LOWER EXTREMITY Common Femoral Vein: No evidence of thrombus. Normal compressibility, respiratory phasicity and response to augmentation. Saphenofemoral Junction: No evidence of thrombus. Normal compressibility and flow on color Doppler imaging. Profunda Femoral Vein: No evidence of thrombus. Normal compressibility and flow on color Doppler imaging. Femoral Vein: No evidence of thrombus. Normal compressibility, respiratory phasicity and response to augmentation. Popliteal Vein: No evidence of thrombus. Normal compressibility, respiratory phasicity and response to augmentation. Calf Veins: No evidence of thrombus. Normal compressibility and flow on color Doppler imaging. Superficial Great  Saphenous Vein: No evidence of thrombus. Normal compressibility and flow on color Doppler imaging. Other Findings:  None. LEFT LOWER EXTREMITY Common Femoral Vein: No evidence of thrombus. Normal compressibility, respiratory phasicity and response to augmentation. Saphenofemoral Junction: No evidence of thrombus. Normal compressibility and flow on color Doppler imaging. Profunda Femoral Vein: No evidence of thrombus. Normal compressibility and flow on color Doppler imaging. Femoral Vein: No evidence of thrombus. Normal compressibility, respiratory phasicity and response to augmentation. Popliteal Vein: No evidence of thrombus. Normal compressibility, respiratory phasicity and response to augmentation. Calf Veins: No evidence of thrombus. Normal compressibility and flow on color Doppler imaging. Superficial Great Saphenous Vein: No evidence of thrombus. Normal compressibility and flow on color Doppler imaging. Other Findings:  None. IMPRESSION: Directed duplex of the bilateral lower extremities negative for DVT Signed, Dulcy Fanny. Nadene Rubins, RPVI [facility] Electronically Signed   By: Corrie Mckusick D.O.   On: 12/14/2021 14:52   NM Hepato W/EF  Result Date: 12/12/2021 CLINICAL DATA:  RIGHT upper quadrant pain. EXAM: NUCLEAR MEDICINE HEPATOBILIARY IMAGING TECHNIQUE: Sequential images of the abdomen were obtained out to 60 minutes following intravenous administration of radiopharmaceutical. RADIOPHARMACEUTICALS:  4.9 mCi Tc-27m Choletec IV COMPARISON:  CT 12/11/2021 FINDINGS: Prompt clearance radiotracer from blood pool and homogeneous uptake in liver. Counts are present in the small bowel by 25 minutes. At 50 minutes, the gallbladder begins to fill. IMPRESSION: 1. Patent cystic duct with filling of the gallbladder. 2. Patent common bile duct. Electronically Signed   By: SSuzy BouchardM.D.   On: 12/12/2021 14:38   DG Chest Portable 1 View  Result  Date: 12/11/2021 CLINICAL DATA:  Cough. EXAM: PORTABLE CHEST 1 VIEW COMPARISON:  Chest radiograph dated 04/02/2021. FINDINGS: No focal consolidation, pleural effusion, pneumothorax. The cardiac silhouette is within limits. No acute osseous pathology. Lower cervical ACDF. IMPRESSION: No active cardiopulmonary disease. Electronically Signed   By: AAnner CreteM.D.   On: 12/11/2021 21:55   UKoreaAbdomen Limited RUQ (LIVER/GB)  Result Date: 12/11/2021 CLINICAL DATA:  Right upper quadrant abdominal pain. EXAM: ULTRASOUND ABDOMEN LIMITED RIGHT UPPER QUADRANT COMPARISON:  CT abdomen pelvis dated 12/11/2021 and ultrasound dated 04/15/2020. FINDINGS: Gallbladder: A 7 mm echogenic focus along the gallbladder wall with no posterior shadowing may represent an adherent sludge. A polyp is less likely. No shadowing stone. The gallbladder wall is thickened and cysts edematous measuring up to 6-7 mm in thickness. There is trace pericholecystic fluid. Positive sonographic Murphy's sign is reported. Common bile duct: Diameter: 7 mm Liver: There is diffuse increased liver echogenicity most commonly seen in the setting of fatty infiltration. Superimposed inflammation or fibrosis is not excluded. Clinical correlation is recommended. Portal vein is patent on color Doppler  imaging with normal direction of blood flow towards the liver. Other: None. IMPRESSION: 1. Probable gallbladder sludge with thickened gallbladder wall and trace pericholecystic fluid. Findings may be related to underlying liver disease or represent acute cholecystitis. A hepatobiliary scintigraphy may provide better evaluation of the gallbladder if there is a high clinical concern for acute cholecystitis . 2. Fatty liver. Electronically Signed   By: Anner Crete M.D.   On: 12/11/2021 21:44   CT Abdomen Pelvis W Contrast  Result Date: 12/11/2021 CLINICAL DATA:  Abdomen pain history of nephrectomy EXAM: CT ABDOMEN AND PELVIS WITH CONTRAST TECHNIQUE:  Multidetector CT imaging of the abdomen and pelvis was performed using the standard protocol following bolus administration of intravenous contrast. RADIATION DOSE REDUCTION: This exam was performed according to the departmental dose-optimization program which includes automated exposure control, adjustment of the mA and/or kV according to patient size and/or use of iterative reconstruction technique. CONTRAST:  137m OMNIPAQUE IOHEXOL 300 MG/ML  SOLN COMPARISON:  CT 05/10/2021, 04/05/2021, 03/06/2018 FINDINGS: Lower chest: Lung bases demonstrate no acute airspace disease. Calcified granuloma at the left lung base. Hepatobiliary: Hepatic steatosis. Right upper quadrant soft tissue stranding and inflammatory process appears to be centered around the gallbladder. No calcified stone. No biliary dilatation. Pancreas: Unremarkable. No pancreatic ductal dilatation or surrounding inflammatory changes. Spleen: Normal in size without focal abnormality. Adrenals/Urinary Tract: Status post right nephrectomy. Adrenal glands are normal. Left kidney shows no hydronephrosis. Multiple left kidney stones measuring up to 6 mm. The bladder is unremarkable Stomach/Bowel: Stomach nonenlarged. No dilated small bowel. No acute bowel wall thickening. Negative appendix. Vascular/Lymphatic: Nonaneurysmal aorta.  No suspicious lymph nodes. Reproductive: Prostate is unremarkable. Other: Negative for pelvic effusion or free air. Fat containing right inguinal hernia Musculoskeletal: No acute osseous abnormality. IMPRESSION: 1. Right upper quadrant inflammatory process, appears to be centered around the gallbladder, raising concern for cholecystitis, suggest correlation with ultrasound. 2. Hepatic steatosis 3. Status post right nephrectomy. Left kidney stones without hydronephrosis Electronically Signed   By: KDonavan FoilM.D.   On: 12/11/2021 19:07   UKoreaVenous Img Lower Unilateral Left (DVT)  Result Date: 11/29/2021 CLINICAL DATA:  Inflamed  varicose vein of left lower extremity EXAM: LEFT LOWER EXTREMITY VENOUS DOPPLER ULTRASOUND TECHNIQUE: Gray-scale sonography with compression, as well as color and duplex ultrasound, were performed to evaluate the deep venous system(s) from the level of the common femoral vein through the popliteal and proximal calf veins. COMPARISON:  04/05/2021 FINDINGS: VENOUS Normal compressibility of the common femoral, superficial femoral, and popliteal veins, as well as the visualized calf veins. Visualized portions of profunda femoral vein and great saphenous vein unremarkable. No filling defects to suggest DVT on grayscale or color Doppler imaging. Doppler waveforms show normal direction of venous flow, normal respiratory plasticity and response to augmentation. Limited views of the contralateral common femoral vein are unremarkable. OTHER Patent varicose veins noted in the lateral distal shin. Limitations: none IMPRESSION: No left lower extremity DVT. Electronically Signed   By: FMiachel RouxM.D.   On: 11/29/2021 09:47      Subjective: Feels better.  No shortness of breath  Discharge Exam: Vitals:   12/14/21 0402 12/14/21 0919  BP: 117/75 (!) 141/75  Pulse: (!) 53 67  Resp: 15   Temp: 97.9 F (36.6 C)   SpO2: 97% 97%   Vitals:   12/13/21 1625 12/13/21 1928 12/14/21 0402 12/14/21 0919  BP: 116/66 127/68 117/75 (!) 141/75  Pulse: (!) 59 (!) 58 (!) 53 67  Resp:  $'18 17 15   'R$ Temp: 98 F (36.7 C) 98.5 F (36.9 C) 97.9 F (36.6 C)   TempSrc: Oral Oral Oral   SpO2: 95% 96% 97% 97%  Weight:      Height:        General: Pt is alert, awake, not in acute distress Cardiovascular: RRR, S1/S2 +, no rubs, no gallops Respiratory: CTA bilaterally, no wheezing, no rhonchi Abdominal: Soft, NT, ND, bowel sounds + Extremities: no edema, no cyanosis    The results of significant diagnostics from this hospitalization (including imaging, microbiology, ancillary and laboratory) are listed below for reference.      Microbiology: Recent Results (from the past 240 hour(s))  SARS Coronavirus 2 by RT PCR (hospital order, performed in [facility] hospital lab) *cepheid single result test* Anterior Nasal Swab     Status: None   Collection Time: 12/11/21 10:15 PM   Specimen: Anterior Nasal Swab  Result Value Ref Range Status   SARS Coronavirus 2 by RT PCR NEGATIVE NEGATIVE Final    Comment: (NOTE) SARS-CoV-2 target nucleic acids are NOT DETECTED.  The SARS-CoV-2 RNA is generally detectable in upper and lower respiratory specimens during the acute phase of infection. The lowest concentration of SARS-CoV-2 viral copies this assay can detect is 250 copies / mL. A negative result does not preclude SARS-CoV-2 infection and should not be used as the sole basis for treatment or other patient management decisions.  A negative result may occur with improper specimen collection / handling, submission of specimen other than nasopharyngeal swab, presence of viral mutation(s) within the areas targeted by this assay, and inadequate number of viral copies (<250 copies / mL). A negative result must be combined with clinical observations, patient history, and epidemiological information.  Fact Sheet for Patients:   https://www.patel.info/  Fact Sheet for Healthcare Providers: https://hall.com/  This test is not yet approved or  cleared by the [place] FDA and has been authorized for detection and/or diagnosis of SARS-CoV-2 by FDA under an Emergency Use Authorization (EUA).  This EUA will remain in effect (meaning this test can be used) for the duration of the COVID-19 declaration under Section 564(b)(1) of the Act, 21 U.S.C. section 360bbb-3(b)(1), unless the authorization is terminated or revoked sooner.  Performed at [facility], [address] [place], [place] [address]   Gastrointestinal Panel by PCR , Stool     Status: Abnormal   Collection  Time: 12/12/21  1:43 AM   Specimen: Stool  Result Value Ref Range Status   Campylobacter species DETECTED (A) NOT DETECTED Final    Comment: RESULT CALLED TO, READ BACK BY AND VERIFIED WITH: FADILA BILAL'@0406'$  12/12/21 RH    Plesimonas shigelloides NOT DETECTED NOT DETECTED Final   Salmonella species NOT DETECTED NOT DETECTED Final   Yersinia enterocolitica NOT DETECTED NOT DETECTED Final   Vibrio species NOT DETECTED NOT DETECTED Final   Vibrio cholerae NOT DETECTED NOT DETECTED Final   Enteroaggregative E coli (EAEC) NOT DETECTED NOT DETECTED Final   Enteropathogenic E coli (EPEC) NOT DETECTED NOT DETECTED Final   Enterotoxigenic E coli (ETEC) NOT DETECTED NOT DETECTED Final   Shiga like toxin producing E coli (STEC) NOT DETECTED NOT DETECTED Final   Shigella/Enteroinvasive E coli (EIEC) NOT DETECTED NOT DETECTED Final   Cryptosporidium NOT DETECTED NOT DETECTED Final   Cyclospora cayetanensis NOT DETECTED NOT DETECTED Final   Entamoeba histolytica NOT DETECTED NOT DETECTED Final   Giardia lamblia NOT DETECTED NOT DETECTED Final   Adenovirus  F40/41 NOT DETECTED NOT DETECTED Final   Astrovirus NOT DETECTED NOT DETECTED Final   Norovirus GI/GII NOT DETECTED NOT DETECTED Final   Rotavirus A NOT DETECTED NOT DETECTED Final   Sapovirus (I, II, IV, and V) NOT DETECTED NOT DETECTED Final    Comment: Performed at [facility], [address], [place], [place] [address]  C Difficile Quick Screen w PCR reflex     Status: None   Collection Time: 12/12/21  1:43 AM   Specimen: STOOL  Result Value Ref Range Status   C Diff antigen NEGATIVE NEGATIVE Final   C Diff toxin NEGATIVE NEGATIVE Final   C Diff interpretation No C. difficile detected.  Final    Comment: Performed at [facility], [address] [place], [place] [address]     Labs: BNP (last 3 results) Recent Labs    04/05/21 1540  BNP 94.7   Basic Metabolic Panel: Recent Labs  Lab 12/11/21 1748  12/12/21 0550 12/13/21 0856 12/14/21 0514  NA 138 138  --  141  K 4.4 3.3* 3.5 4.0  CL 108 108  --  112*  CO2 20* 23  --  23  GLUCOSE 172* 118*  --  122*  BUN 15 16  --  18  CREATININE 1.33* 1.31*  --  1.17  CALCIUM 8.9 8.5*  --  8.5*   Liver Function Tests: Recent Labs  Lab 12/11/21 1748  AST 27  ALT 26  ALKPHOS 66  BILITOT 1.5*  PROT 8.3*  ALBUMIN 3.7   Recent Labs  Lab 12/11/21 1748  LIPASE 36   No results for input(s): "AMMONIA" in the last 168 hours. CBC: Recent Labs  Lab 12/11/21 1748 12/12/21 0550  WBC 12.2* 8.8  NEUTROABS 9.4*  --   HGB 14.6 14.0  HCT 43.7 41.3  MCV 85.9 86.0  PLT 307 248   Cardiac Enzymes: No results for input(s): "CKTOTAL", "CKMB", "CKMBINDEX", "TROPONINI" in the last 168 hours. BNP: Invalid input(s): "POCBNP" CBG: Recent Labs  Lab 12/13/21 1929 12/13/21 2357 12/14/21 0402 12/14/21 0807 12/14/21 1220  GLUCAP 208* 178* 128* 94 155*   D-Dimer No results for input(s): "DDIMER" in the last 72 hours. Hgb A1c Recent Labs    12/12/21 0550  HGBA1C 7.6*   Lipid Profile No results for input(s): "CHOL", "HDL", "LDLCALC", "TRIG", "CHOLHDL", "LDLDIRECT" in the last 72 hours. Thyroid function studies No results for input(s): "TSH", "T4TOTAL", "T3FREE", "THYROIDAB" in the last 72 hours.  Invalid input(s): "FREET3" Anemia work up No results for input(s): "VITAMINB12", "FOLATE", "FERRITIN", "TIBC", "IRON", "RETICCTPCT" in the last 72 hours. Urinalysis    Component Value Date/Time   COLORURINE YELLOW (A) 12/12/2021 0143   APPEARANCEUR CLEAR (A) 12/12/2021 0143   APPEARANCEUR Clear 04/14/2021 0806   LABSPEC >1.046 (H) 12/12/2021 0143   LABSPEC 1.029 05/31/2014 0637   PHURINE 5.0 12/12/2021 0143   GLUCOSEU NEGATIVE 12/12/2021 0143   GLUCOSEU 50 mg/dL 05/31/2014 0637   HGBUR NEGATIVE 12/12/2021 0143   BILIRUBINUR NEGATIVE 12/12/2021 0143   BILIRUBINUR Negative 04/14/2021 0806   BILIRUBINUR Negative 05/31/2014 0637    KETONESUR NEGATIVE 12/12/2021 0143   PROTEINUR 100 (A) 12/12/2021 0143   NITRITE NEGATIVE 12/12/2021 0143   LEUKOCYTESUR NEGATIVE 12/12/2021 0143   LEUKOCYTESUR Negative 05/31/2014 0637   Sepsis Labs Recent Labs  Lab 12/11/21 1748 12/12/21 0550  WBC 12.2* 8.8   Microbiology Recent Results (from the past 240 hour(s))  SARS Coronavirus 2 by RT PCR (hospital order, performed in [facility] hospital lab) *cepheid single result test* Anterior Nasal Swab     Status: None   Collection Time: 12/11/21 10:15 PM   Specimen: Anterior Nasal Swab  Result Value Ref Range Status   SARS Coronavirus 2 by RT PCR NEGATIVE NEGATIVE Final    Comment: (NOTE) SARS-CoV-2 target nucleic acids are NOT DETECTED.  The SARS-CoV-2 RNA is generally detectable in upper and lower respiratory specimens during the acute phase of infection. The lowest concentration of SARS-CoV-2 viral copies this assay can detect is 250 copies / mL. A negative result does not preclude SARS-CoV-2 infection and should not be used as the sole basis for treatment or other patient management decisions.  A negative result may occur with improper specimen collection / handling, submission of specimen other than nasopharyngeal swab, presence of viral mutation(s) within the areas targeted by this assay, and inadequate number of viral copies (<250 copies / mL). A negative result must be combined with clinical observations, patient history, and epidemiological information.  Fact Sheet for Patients:   https://www.patel.info/  Fact Sheet for Healthcare Providers: https://hall.com/  This test is not yet approved or  cleared by the [place] FDA and has been authorized for detection and/or diagnosis of SARS-CoV-2 by FDA under an Emergency Use Authorization (EUA).  This EUA will remain in effect (meaning this test can be used) for the duration of the COVID-19 declaration under Section 564(b)(1)  of the Act, 21 U.S.C. section 360bbb-3(b)(1), unless the authorization is terminated or revoked sooner.  Performed at [facility], [address] [place], [place] [address]   Gastrointestinal Panel by PCR , Stool     Status: Abnormal   Collection Time: 12/12/21  1:43 AM   Specimen: Stool  Result Value Ref Range Status   Campylobacter species DETECTED (A) NOT DETECTED Final    Comment: RESULT CALLED TO, READ BACK BY AND VERIFIED WITH: FADILA BILAL'@0406'$  12/12/21 RH    Plesimonas shigelloides NOT DETECTED NOT DETECTED Final   Salmonella species NOT DETECTED NOT DETECTED Final   Yersinia enterocolitica NOT DETECTED NOT DETECTED Final   Vibrio species NOT DETECTED NOT DETECTED Final   Vibrio cholerae NOT DETECTED NOT DETECTED Final   Enteroaggregative E coli (EAEC) NOT DETECTED NOT DETECTED Final   Enteropathogenic E coli (EPEC) NOT DETECTED NOT DETECTED Final   Enterotoxigenic E coli (ETEC) NOT DETECTED NOT DETECTED Final   Shiga like toxin producing E coli (STEC) NOT DETECTED NOT DETECTED Final   Shigella/Enteroinvasive E coli (EIEC) NOT DETECTED NOT DETECTED Final   Cryptosporidium NOT DETECTED NOT DETECTED Final   Cyclospora cayetanensis NOT DETECTED NOT DETECTED Final   Entamoeba histolytica NOT DETECTED NOT DETECTED Final   Giardia lamblia NOT DETECTED NOT DETECTED Final   Adenovirus F40/41 NOT DETECTED NOT DETECTED Final   Astrovirus NOT DETECTED NOT DETECTED Final   Norovirus GI/GII NOT DETECTED NOT DETECTED Final   Rotavirus A NOT DETECTED NOT DETECTED Final   Sapovirus (I, II, IV, and V) NOT DETECTED NOT DETECTED Final    Comment: Performed at [facility], [address] [place], [place] [address]  C Difficile Quick Screen w PCR reflex     Status: None   Collection Time: 12/12/21  1:43 AM   Specimen: STOOL  Result Value Ref Range Status   C Diff antigen NEGATIVE NEGATIVE Final   C Diff toxin NEGATIVE NEGATIVE Final   C Diff interpretation No C.  difficile detected.  Final    Comment: Performed at [facility]  Lab, [place], [place] [address]     Time coordinating discharge: Over 30 minutes  SIGNED:   Nolberto Hanlon, MD  [facility] 12/14/2021, 3:03 PM Pager   If 7PM-7AM, please contact night-coverage [email or web address] Password [email or web address]

## 2021-12-14 NOTE — Plan of Care (Signed)

## 2021-12-14 NOTE — Inpatient Diabetes Management (Signed)
Inpatient Diabetes Program Recommendations  AACE/ADA: New Consensus Statement on Inpatient Glycemic Control (2015)  Target Ranges:  Prepandial:   less than 140 mg/dL      Peak postprandial:   less than 180 mg/dL (1-2 hours)      Critically ill patients:  140 - 180 mg/dL    Latest Reference Range & Units 12/13/21 08:34 12/13/21 12:01 12/13/21 16:27 12/13/21 19:29 12/13/21 23:57 12/14/21 04:02 12/14/21 08:07  Glucose-Capillary 70 - 99 mg/dL 103 (H) 179 (H) 213 (H) 208 (H) 178 (H) 128 (H) 94   Review of Glycemic Control  Diabetes history: DM2 Outpatient Diabetes medications: 70/30 Insulin 60 units AM/ 55 units PM, Metformin 1000 mg AM/ 500 mg PM Current orders for Inpatient glycemic control: Novolog 0-9 units Q4H  Inpatient Diabetes Program Recommendations:    Insulin: If patient is eating well, please consider changing CBGs to ACHS and Novolog to 0-9 units AC&HS. Also, please consider ordering Novolog 3 units TID with meals for meal coverage if patient eats at least 50% of meals.  Thanks, Barnie Alderman, RN, MSN, [place] Diabetes Coordinator Inpatient Diabetes Program [phone] (Team Pager from 8am to [place])

## 2021-12-14 NOTE — Plan of Care (Signed)
  Problem: Elimination: Goal: Will not experience complications related to bowel motility Outcome: Not Progressing   Problem: Pain Managment: Goal: General experience of comfort will improve Outcome: Not Progressing   Problem: Nutritional: Goal: Maintenance of adequate nutrition will improve Outcome: Not Progressing

## 2021-12-14 NOTE — Progress Notes (Signed)
Patient discharged to home with all belongings. Patient home medications and AVS are completed and has been reviewed with patient . PIVX2 removed with catheter Intact.

## 2021-12-18 ENCOUNTER — Emergency Department: Payer: Medicare HMO

## 2021-12-18 ENCOUNTER — Emergency Department
Admission: EM | Admit: 2021-12-18 | Discharge: 2021-12-18 | Disposition: A | Discharge status: Home / Self Care | Payer: Medicare HMO | Attending: Student in an Organized Health Care Education/Training Program | Admitting: Student in an Organized Health Care Education/Training Program

## 2021-12-18 ENCOUNTER — Other Ambulatory Visit: Payer: Self-pay

## 2021-12-18 DIAGNOSIS — R944 Abnormal results of kidney function studies: Secondary | ICD-10-CM | POA: Diagnosis not present

## 2021-12-18 DIAGNOSIS — R0789 Other chest pain: Secondary | ICD-10-CM | POA: Diagnosis present

## 2021-12-18 DIAGNOSIS — Z20822 Contact with and (suspected) exposure to covid-19: Secondary | ICD-10-CM | POA: Insufficient documentation

## 2021-12-18 DIAGNOSIS — R1013 Epigastric pain: Secondary | ICD-10-CM | POA: Diagnosis not present

## 2021-12-18 DIAGNOSIS — R1084 Generalized abdominal pain: Secondary | ICD-10-CM

## 2021-12-18 DIAGNOSIS — R079 Chest pain, unspecified: Secondary | ICD-10-CM

## 2021-12-18 DIAGNOSIS — E860 Dehydration: Secondary | ICD-10-CM | POA: Insufficient documentation

## 2021-12-18 LAB — URINALYSIS, ROUTINE W REFLEX MICROSCOPIC
Bacteria, UA: NONE SEEN
Bilirubin Urine: NEGATIVE
Glucose, UA: NEGATIVE mg/dL
Hgb urine dipstick: NEGATIVE
Ketones, ur: NEGATIVE mg/dL
Leukocytes,Ua: NEGATIVE
Nitrite: NEGATIVE
Protein, ur: 30 mg/dL — AB
Specific Gravity, Urine: 1.042 — ABNORMAL HIGH (ref 1.005–1.030)
Squamous Epithelial / HPF: NONE SEEN (ref 0–5)
pH: 6 (ref 5.0–8.0)

## 2021-12-18 LAB — COMPREHENSIVE METABOLIC PANEL
ALT: 33 U/L (ref 0–44)
AST: 30 U/L (ref 15–41)
Albumin: 3.6 g/dL (ref 3.5–5.0)
Alkaline Phosphatase: 62 U/L (ref 38–126)
Anion gap: 9 (ref 5–15)
BUN: 17 mg/dL (ref 6–20)
CO2: 22 mmol/L (ref 22–32)
Calcium: 9.1 mg/dL (ref 8.9–10.3)
Chloride: 106 mmol/L (ref 98–111)
Creatinine, Ser: 1.27 mg/dL — ABNORMAL HIGH (ref 0.61–1.24)
GFR, Estimated: >60 mL/min (ref >60)
Glucose, Bld: 172 mg/dL — ABNORMAL HIGH (ref 70–99)
Potassium: 3.8 mmol/L (ref 3.5–5.1)
Sodium: 137 mmol/L (ref 135–145)
Total Bilirubin: 0.8 mg/dL (ref 0.3–1.2)
Total Protein: 7.9 g/dL (ref 6.5–8.1)

## 2021-12-18 LAB — SARS CORONAVIRUS 2 BY RT PCR: SARS Coronavirus 2 by RT PCR: NEGATIVE

## 2021-12-18 LAB — LACTIC ACID, PLASMA: Lactic Acid, Venous: 1.5 mmol/L (ref 0.5–1.9)

## 2021-12-18 LAB — CBC
HCT: 41.3 % (ref 39.0–52.0)
Hemoglobin: 13.7 g/dL (ref 13.0–17.0)
MCH: 28.6 pg (ref 26.0–34.0)
MCHC: 33.2 g/dL (ref 30.0–36.0)
MCV: 86.2 fL (ref 80.0–100.0)
Platelets: 257 10*3/uL (ref 150–400)
RBC: 4.79 MIL/uL (ref 4.22–5.81)
RDW: 13.1 % (ref 11.5–15.5)
WBC: 5.9 10*3/uL (ref 4.0–10.5)
nRBC: 0 % (ref 0.0–0.2)

## 2021-12-18 LAB — TROPONIN I (HIGH SENSITIVITY)
Troponin I (High Sensitivity): 6 ng/L (ref <18)
Troponin I (High Sensitivity): 5 ng/L (ref <18)

## 2021-12-18 LAB — LIPASE, BLOOD: Lipase: 36 U/L (ref 11–51)

## 2021-12-18 MED ORDER — ONDANSETRON HCL 4 MG/2ML IJ SOLN
4.0000 mg | Freq: Once | INTRAMUSCULAR | Status: AC
  Administered 2021-12-18: 4 mg via INTRAVENOUS
  Filled 2021-12-18: qty 2

## 2021-12-18 MED ORDER — SODIUM CHLORIDE 0.9 % IV BOLUS
500.0000 mL | Freq: Once | INTRAVENOUS | Status: AC
  Administered 2021-12-18: 500 mL via INTRAVENOUS

## 2021-12-18 MED ORDER — ALUM & MAG HYDROXIDE-SIMETH 200-200-20 MG/5ML PO SUSP
30.0000 mL | Freq: Once | ORAL | Status: AC
  Administered 2021-12-18: 30 mL via ORAL
  Filled 2021-12-18: qty 30

## 2021-12-18 MED ORDER — MORPHINE SULFATE (PF) 4 MG/ML IV SOLN
4.0000 mg | INTRAVENOUS | Status: DC | PRN
  Administered 2021-12-18: 4 mg via INTRAVENOUS
  Filled 2021-12-18: qty 1

## 2021-12-18 MED ORDER — ONDANSETRON 4 MG PO TBDP: 4.0000 mg | ORAL_TABLET | Freq: Three times a day (TID) | ORAL | 0 refills | Status: DC | PRN

## 2021-12-18 MED ORDER — IOHEXOL 300 MG/ML  SOLN
100.0000 mL | Freq: Once | INTRAMUSCULAR | Status: AC | PRN
  Administered 2021-12-18: 100 mL via INTRAVENOUS

## 2021-12-18 NOTE — ED Provider Notes (Signed)
[facility] Provider Note    Event Date/Time   First MD Initiated Contact with Patient 12/18/21 1101     (approximate)   History   No chief complaint on file.   HPI  Anthony Fuller is a 61 y.o. male presents to the ER for evaluation of chest discomfort nausea abdominal discomfort.  Patient recently admitted to the hospital diagnosed with Campylobacter infection.  Has been having some generalized malaise since then.  Has been having persistent loose stools but the diarrhea overall is improving.  This morning started having some chest discomfort and indigestion.  Denies any shortness of breath or discomfort with deep inspiration.     Physical Exam   Triage Vital Signs: ED Triage Vitals  Enc Vitals Group     BP 12/18/21 1006 139/71     Pulse Rate 12/18/21 1006 69     Resp 12/18/21 1006 18     Temp 12/18/21 1006 99 F (37.2 C)     Temp Source 12/18/21 1006 Oral     SpO2 12/18/21 0958 96 %     Weight 12/18/21 1006 (!) 302 lb 0.5 oz (137 kg)     Height 12/18/21 1006 '6\' 2"'$  (1.88 m)     Head Circumference --      Peak Flow --      Pain Score 12/18/21 1006 8     Pain Loc --      Pain Edu? --      Excl. in [place]? --     Most recent vital signs: Vitals:   12/18/21 1400 12/18/21 1430  BP: 136/75 (!) 147/81  Pulse: (!) 58 63  Resp: 17 18  Temp:    SpO2: 98% 97%     Constitutional: Alert  Eyes: Conjunctivae are normal.  Head: Atraumatic. Nose: No congestion/rhinnorhea. Mouth/Throat: Mucous membranes are moist.   Neck: Painless ROM.  Cardiovascular:   Good peripheral circulation. Respiratory: Normal respiratory effort.  No retractions.  Gastrointestinal: Soft and nontender.  Musculoskeletal:  no deformity Neurologic:  MAE spontaneously. No gross focal neurologic deficits are appreciated.  Skin:  Skin is warm, dry and intact. No rash noted. Psychiatric: Mood and affect are normal. Speech and behavior are normal.    ED Results / Procedures /  Treatments   Labs (all labs ordered are listed, but only abnormal results are displayed) Labs Reviewed  COMPREHENSIVE METABOLIC PANEL - Abnormal; Notable for the following components:      Result Value   Glucose, Bld 172 (*)    Creatinine, Ser 1.27 (*)    All other components within normal limits  URINALYSIS, ROUTINE W REFLEX MICROSCOPIC - Abnormal; Notable for the following components:   Color, Urine YELLOW (*)    APPearance CLEAR (*)    Specific Gravity, Urine 1.042 (*)    Protein, ur 30 (*)    All other components within normal limits  SARS CORONAVIRUS 2 BY RT PCR  C DIFFICILE QUICK SCREEN W PCR REFLEX    CBC  LIPASE, BLOOD  LACTIC ACID, PLASMA  TROPONIN I (HIGH SENSITIVITY)  TROPONIN I (HIGH SENSITIVITY)     EKG  ED ECG REPORT I, Merlyn Lot, the attending physician, personally viewed and interpreted this ECG.   Date: 12/18/2021  EKG Time: 10:05  Rate: 70  Rhythm: sinus  Axis: normal  Intervals:normal intervals  ST&T Change: no stemi, no depressions    RADIOLOGY Please see ED Course for my review and interpretation.  I personally reviewed all  radiographic images ordered to evaluate for the above acute complaints and reviewed radiology reports and findings.  These findings were personally discussed with the patient.  Please see medical record for radiology report.    PROCEDURES:  Critical Care performed: No  Procedures   MEDICATIONS ORDERED IN ED: Medications  morphine (PF) 4 MG/ML injection 4 mg (4 mg Intravenous Given 12/18/21 1204)  sodium chloride 0.9 % bolus 500 mL (0 mLs Intravenous Stopped 12/18/21 1432)  ondansetron (ZOFRAN) injection 4 mg (4 mg Intravenous Given 12/18/21 1204)  iohexol (OMNIPAQUE) 300 MG/ML solution 100 mL (100 mLs Intravenous Contrast Given 12/18/21 1146)  alum & mag hydroxide-simeth (MAALOX/MYLANTA) 200-200-20 MG/5ML suspension 30 mL (30 mLs Oral Given 12/18/21 1325)  sodium chloride 0.9 % bolus 500 mL (500 mLs Intravenous New  Bag/Given 12/18/21 1433)     IMPRESSION / MDM / ASSESSMENT AND PLAN / ED COURSE  I reviewed the triage vital signs and the nursing notes.                              Differential diagnosis includes, but is not limited to, enteritis, gastritis, esophagitis, ACS, pneumonia, diverticulitis, biliary pathology, medication of  Patient presented to the ER for evaluation of symptoms as described above.  This presenting complaint could reflect a potentially life-threatening illness therefore the patient will be placed on continuous pulse oximetry and telemetry for monitoring.  Laboratory evaluation will be sent to evaluate for the above complaints.  Given recent presentation of persistent epigastric pain will repeat imaging.  Will provide IV fluids as well as IV morphine for pain.    Clinical Course as of 12/18/21 1451  Mon Dec 18, 2021  1317 Patient's lactate is normal.  Troponin negative.  COVID-negative.  No leukocytosis.  Mild bump in creatinine receiving IV fluids.  CT imaging my review and interpretation does not show any evidence of obstructive pattern perforation.  Per radiology does not show any acute intra-abdominal process. [PR]  1448 Patient tolerating p.o.  Feeling improved.  His blood work is reassuring.  At this point I do believe he is stable and appropriate for outpatient follow-up.  Patient agreeable to plan. [PR]    Clinical Course User Index [PR] Merlyn Lot, MD    FINAL CLINICAL IMPRESSION(S) / ED DIAGNOSES   Final diagnoses:  Generalized abdominal pain  Chest pain, unspecified type  Dehydration     Rx / DC Orders   ED Discharge Orders          Ordered    ondansetron (ZOFRAN-ODT) 4 MG disintegrating tablet  Every 8 hours PRN        12/18/21 1449             Note:  This document was prepared using Dragon voice recognition software and may include unintentional dictation errors.    Merlyn Lot, MD 12/18/21 1451

## 2021-12-18 NOTE — ED Triage Notes (Signed)
Per [facility] pt was just discharged from hospital for GI infection. Since sent home c/o worsening diarrhea and sweats. EMS reports orthostatic and given 500 CC NS and 4 mg zofran.

## 2021-12-18 NOTE — ED Triage Notes (Signed)
Pt to ED via [facility] from home. Pt reports N/V/D, dizziness, diaphoretic and CP. Pt recently admitted and just discharged for GI infections. Pt given 500cc NS and '4mg'$  Zofran in route. 18g LFA

## 2021-12-21 ENCOUNTER — Encounter (INDEPENDENT_AMBULATORY_CARE_PROVIDER_SITE_OTHER): Payer: Medicare HMO

## 2021-12-21 ENCOUNTER — Ambulatory Visit (INDEPENDENT_AMBULATORY_CARE_PROVIDER_SITE_OTHER): Payer: Medicare HMO | Admitting: Nurse Practitioner

## 2022-03-12 ENCOUNTER — Encounter (INDEPENDENT_AMBULATORY_CARE_PROVIDER_SITE_OTHER): Payer: Self-pay

## 2022-05-17 ENCOUNTER — Other Ambulatory Visit: Payer: Self-pay | Admitting: Gastroenterology

## 2022-05-17 DIAGNOSIS — K625 Hemorrhage of anus and rectum: Secondary | ICD-10-CM

## 2022-05-17 DIAGNOSIS — R1013 Epigastric pain: Secondary | ICD-10-CM

## 2022-06-08 ENCOUNTER — Other Ambulatory Visit: Payer: Medicare HMO

## 2022-07-05 ENCOUNTER — Other Ambulatory Visit (INDEPENDENT_AMBULATORY_CARE_PROVIDER_SITE_OTHER): Payer: Self-pay | Admitting: Nurse Practitioner

## 2022-07-05 DIAGNOSIS — I83812 Varicose veins of left lower extremities with pain: Secondary | ICD-10-CM

## 2022-07-09 ENCOUNTER — Encounter (INDEPENDENT_AMBULATORY_CARE_PROVIDER_SITE_OTHER): Payer: Medicare HMO

## 2022-07-09 ENCOUNTER — Encounter (INDEPENDENT_AMBULATORY_CARE_PROVIDER_SITE_OTHER): Payer: Medicare HMO | Admitting: Vascular Surgery

## 2022-07-10 ENCOUNTER — Encounter: Admission: RE | Payer: Self-pay | Source: Home / Self Care

## 2022-07-10 ENCOUNTER — Ambulatory Visit: Admission: RE | Admit: 2022-07-10 | Payer: Medicare HMO | Source: Home / Self Care

## 2022-07-10 SURGERY — COLONOSCOPY WITH PROPOFOL
Anesthesia: General

## 2022-08-07 ENCOUNTER — Ambulatory Visit
Admission: EM | Admit: 2022-08-07 | Discharge: 2022-08-07 | Disposition: A | Discharge status: Home / Self Care | Payer: Medicare HMO | Attending: Emergency Medicine | Admitting: Emergency Medicine

## 2022-08-07 ENCOUNTER — Other Ambulatory Visit: Payer: Self-pay

## 2022-08-07 DIAGNOSIS — M5416 Radiculopathy, lumbar region: Secondary | ICD-10-CM | POA: Insufficient documentation

## 2022-08-07 LAB — URINALYSIS, MICROSCOPIC (REFLEX)

## 2022-08-07 LAB — URINALYSIS, ROUTINE W REFLEX MICROSCOPIC
Bilirubin Urine: NEGATIVE
Glucose, UA: 100 mg/dL — AB
Hgb urine dipstick: NEGATIVE
Ketones, ur: NEGATIVE mg/dL
Leukocytes,Ua: NEGATIVE
Nitrite: NEGATIVE
Protein, ur: 30 mg/dL — AB
Specific Gravity, Urine: 1.025 (ref 1.005–1.030)
pH: 5.5 (ref 5.0–8.0)

## 2022-08-07 MED ORDER — DICLOFENAC SODIUM 1 % EX GEL: 4.0000 g | Freq: Four times a day (QID) | CUTANEOUS | 1 refills | Status: DC

## 2022-08-07 MED ORDER — DEXAMETHASONE SODIUM PHOSPHATE 10 MG/ML IJ SOLN
10.0000 mg | Freq: Once | INTRAMUSCULAR | Status: AC
  Administered 2022-08-07: 10 mg via INTRAMUSCULAR

## 2022-08-07 NOTE — ED Provider Notes (Signed)
[facility]    CSN: [id number] Arrival date & time: 08/07/22  1922      History   Chief Complaint Chief Complaint  Patient presents with   Back Pain    Low back pain radiating down left leg pain started a couple of days ago and getting worse.     HPI Anthony Fuller is a 62 y.o. male.   Patient presents for evaluation of left-sided low back pain radiating down into the buttocks and hips beginning 3 to 4 days ago after lifting objects on the porch.  Pain has been constant, fluctuating in intensity, it is heightened is described as electric shocks.  Has attempted use of daily prescribed medicine of tramadol and Flexeril which have been ineffective.  Denies numbness or tingling, urinary or bowel incontinence.  Patient noticed darkening urine with odor prior to coming to office visit today denies further urinary symptoms     Past Medical History:  Diagnosis Date   Acid indigestion 02/14/2012   Anxiety    Arthritis    BIL.KNEES   Bulge of lumbar disc without myelopathy 09/05/2011   Cancer ([place]) 2012   R kidney removed   Cervical stenosis of spine    Cervicalgia    Chest wall pain    Clostridium difficile infection 02/04/2014   DDD (degenerative disc disease), lumbar    DDD (degenerative disc disease), lumbar    Degeneration of intervertebral disc of lumbar region 08/23/2011   Diabetes mellitus ([place]) 07/24/2011   Diabetes mellitus without complication ([place])    Diabetic polyneuropathy associated with type 2 diabetes mellitus ([place])    Diarrhea    DM (diabetes mellitus) type 2, uncontrolled, with ketoacidosis ([place]) 05/30/2015   Dyspepsia    Enthesopathy of hip 04/11/2015   Fatty liver disease, nonalcoholic [id number]   GERD (gastroesophageal reflux disease)    Headache    Hepatic cirrhosis (HCC) 09/12/2014   Hydrocele    Hydrocele    Hypertension    IBS (irritable bowel syndrome)    Lumbar herniated disc    Lumbar radiculopathy 09/05/2011   Lumbar stenosis     Peroneal tendon injury, right, initial encounter    Peroneal tendon tear    Radiculopathy    Renal insufficiency    Right Nephrectomy due to [place].   Sleep apnea    snores   Stenosis of cervical spine    Thoracic and lumbosacral neuritis 07/24/2011   Thrombocytopathia ([place])    Thrombocytopenia ([place])     Patient Active Problem List   Diagnosis Date Noted   Right upper quadrant pain 12/12/2021   Type 2 diabetes mellitus with peripheral neuropathy ([place]) 12/12/2021   Acute gastroenteritis 12/11/2021   Gastroesophageal reflux disease without esophagitis 08/05/2016   Diabetic polyneuropathy associated with type 2 diabetes mellitus ([place]) 02/03/2016   Essential hypertension 07/12/2015   Cervical spinal stenosis 06/24/2015   Sepsis ([place]) 06/16/2015   Left sided abdominal pain 05/30/2015   Chest pain 05/30/2015   CKD (chronic kidney disease), 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   Hyponatremia 05/30/2015   Leukocytosis 05/30/2015   DM (diabetes mellitus) type 2, uncontrolled, with ketoacidosis ([place]) 05/30/2015   Solitary kidney 05/30/2015   Acute pyelonephritis 05/27/2015   Pain due to ureteral stent ([place]) 05/27/2015   Enthesopathy of hip 04/11/2015   Hepatic cirrhosis ([place]) 09/12/2014   Fatty liver disease, nonalcoholic [id number]   Cervical pain 08/06/2014   Clostridium difficile infection 02/04/2014   D (diarrhea) 12/17/2013   Arthralgia of hip 08/26/2013  H/O urinary disorder 06/29/2013   Acid indigestion 02/14/2012   LBP (low back pain) 12/18/2011   Bulge of lumbar disc without myelopathy 09/05/2011   Lumbar radiculopathy 09/05/2011   Degeneration of intervertebral disc of lumbar region 08/23/2011   Lumbar canal stenosis 08/23/2011   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   Thoracic and lumbosacral neuritis 07/24/2011   Diabetes mellitus ([place]) 07/24/2011    Past Surgical History:  Procedure Laterality Date   BACK SURGERY     cervical fusion C5-7   CARDIAC CATHETERIZATION  Right 06/20/2015   Procedure: Left Heart Cath and Coronary Angiography;  Surgeon: Dionisio David, MD;  Location: [facility];  Service: Cardiovascular;  Laterality: Right;   COLONOSCOPY     CYSTOSCOPY W/ RETROGRADES Left 05/18/2015   Procedure: CYSTOSCOPY WITH RETROGRADE PYELOGRAM;  Surgeon: Nickie Retort, MD;  Location: [facility];  Service: Urology;  Laterality: Left;   CYSTOSCOPY WITH STENT PLACEMENT Left 05/18/2015   Procedure: CYSTOSCOPY WITH STENT PLACEMENT;  Surgeon: Nickie Retort, MD;  Location: [facility];  Service: Urology;  Laterality: Left;   CYSTOSCOPY WITH STENT PLACEMENT Left 06/28/2015   Procedure: CYSTOSCOPY WITH STENT PLACEMENT/ EXCHANGE;  Surgeon: Hollice Espy, MD;  Location: [facility];  Service: Urology;  Laterality: Left;   ESOPHAGOGASTRODUODENOSCOPY (EGD) WITH PROPOFOL N/A 09/03/2016   Procedure: ESOPHAGOGASTRODUODENOSCOPY (EGD) WITH PROPOFOL;  Surgeon: Manya Silvas, MD;  Location: [facility];  Service: Endoscopy;  Laterality: N/A;   ESOPHAGOGASTRODUODENOSCOPY ENDOSCOPY N/A 03/14/2018   Procedure: ESOPHAGOGASTRODUODENOSCOPY ENDOSCOPY;  Surgeon: Manya Silvas, MD;  Location: [facility];  Service: Endoscopy;  Laterality: N/A;   HALLUX VALGUS BASE WEDGE Right 08/23/2017   Procedure: HALLUX VALGUS BASE WEDGE;  Surgeon: Samara Deist, DPM;  Location: [facility];  Service: Podiatry;  Laterality: Right;   INSERTION OF MESH  02/27/2021   Procedure: INSERTION OF MESH;  Surgeon: Herbert Pun, MD;  Location: [facility];  Service: General;;   JOINT REPLACEMENT     right knee    kidney removed Right    2012   NAFLD     NEPHRECTOMY Right 04/2011   ORIF TOE FRACTURE Right 08/23/2017   Procedure: OPEN REDUCTION INTERNAL FIXATION (ORIF) METATARSAL (TOE) FRACTURE;  Surgeon: Samara Deist, DPM;  Location: [facility];  Service: Podiatry;  Laterality: Right;   SPINE SURGERY     lumbar 2013   URETEROSCOPY WITH HOLMIUM LASER LITHOTRIPSY Left 05/18/2015   Procedure:  URETEROSCOPY WITH HOLMIUM LASER LITHOTRIPSY;  Surgeon: Nickie Retort, MD;  Location: [facility];  Service: Urology;  Laterality: Left;   URETEROSCOPY WITH HOLMIUM LASER LITHOTRIPSY Left 06/28/2015   Procedure: URETEROSCOPY WITH HOLMIUM LASER LITHOTRIPSY;  Surgeon: Hollice Espy, MD;  Location: [facility];  Service: Urology;  Laterality: Left;       Home Medications    Prior to Admission medications   Medication Sig Start Date End Date Taking? Authorizing Provider  albuterol (VENTOLIN HFA) 108 (90 Base) MCG/ACT inhaler SMARTSIG:2 inhalation Via Inhaler Every 6 Hours PRN 10/20/21   [provider]  cetirizine (ZYRTEC) 10 MG tablet Take 10 mg by mouth daily as needed for allergies.    [provider]  clopidogrel (PLAVIX) 75 MG tablet Take 75 mg by mouth daily. 03/27/21   [provider]  gabapentin (NEURONTIN) 400 MG capsule Take 400 mg by mouth 3 (three) times daily.    [provider]  insulin NPH-regular Human (70-30) 100 UNIT/ML injection Inject 55-60 Units into the skin See admin instructions. Inject 60  units in the morning and 55 units at night    [provider]  lidocaine (XYLOCAINE) 5 % ointment Apply 1 application topically 2 (two) times daily. Patient not taking: Reported on 12/11/2021    [provider]  metFORMIN (GLUCOPHAGE-XR) 500 MG 24 hr tablet Take 500-1,000 mg by mouth See admin instructions. Take 1000 mg in the morning and 500 mg in the evening 12/05/15   [provider]  methocarbamol (ROBAXIN) 500 MG tablet Take 500 mg by mouth every 8 (eight) hours as needed for muscle spasms.    [provider]  metoprolol succinate (TOPROL-XL) 25 MG 24 hr tablet Take 25 mg by mouth daily. 12/06/21   [provider]  Multiple Vitamin (MULTIVITAMIN WITH MINERALS) TABS tablet Take 1 tablet by mouth daily.    [provider]  omeprazole (PRILOSEC) 40 MG capsule Take 40 mg by mouth daily.    [provider]  ondansetron (ZOFRAN-ODT) 4 MG disintegrating tablet Take 1 tablet (4 mg total) by mouth every 8 (eight) hours as needed for nausea or vomiting. 12/18/21   Merlyn Lot, MD  rosuvastatin (CRESTOR) 5 MG tablet Take 5 mg by mouth daily. 10/26/21   [provider]  tiZANidine (ZANAFLEX) 4 MG capsule Take 4 mg by mouth 3 (three) times daily.    [provider]  traMADol (ULTRAM) 50 MG tablet Take 50 mg by mouth every 6 (six) hours as needed for moderate pain. for pain    [provider]    Family History Family History  Problem Relation Age of Onset   Diabetes Mother    Hypertension Mother    Asthma Mother    Heart disease Father    Kidney disease Father    Diabetes Father    Stroke Father     Social History Social History   Tobacco Use   Smoking status: Never   Smokeless tobacco: Never  Vaping Use   Vaping Use: Never used  Substance Use Topics   Alcohol use: No   Drug use: No     Allergies   Hydromorphone, Aspirin, Nsaids, and Floxin [ofloxacin]   Review of Systems Review of Systems Defer to HPI    Physical Exam Triage Vital Signs ED Triage Vitals [08/07/22 1942]  Enc Vitals Group     BP (!) 143/71     Pulse Rate 75     Resp 20     Temp 98.6 F (37 C)     Temp Source Oral     SpO2 96 %     Weight      Height      Head Circumference      Peak Flow      Pain Score      Pain Loc      Pain Edu?      Excl. in [place]?    No data found.  Updated Vital Signs BP (!) 143/71   Pulse 75   Temp 98.6 F (37 C) (Oral)   Resp 20   SpO2 96%   Visual Acuity Right Eye Distance:   Left Eye Distance:   Bilateral Distance:    Right Eye Near:   Left Eye Near:    Bilateral Near:     Physical Exam Constitutional:      Appearance: Normal appearance.  Eyes:     Extraocular Movements: Extraocular movements intact.  Pulmonary:     Effort: Pulmonary effort is normal.  Musculoskeletal:     Comments:  Bruises present over  the left latissimus dorsi without ecchymosis, swelling or deformity, positive straight leg test, able to sit erect without complication, able to twist turn and bend but pain is elicited with movement  Neurological:     Mental Status: He is alert and oriented to person, place, and time. Mental status is at baseline.      UC Treatments / Results  Labs (all labs ordered are listed, but only abnormal results are displayed) Labs Reviewed  URINALYSIS, [place] MICROSCOPIC    EKG   Radiology No results found.  Procedures Procedures (including critical care time)  Medications Ordered in UC Medications - No data to display  Initial Impression / Assessment and Plan / UC Course  I have reviewed the triage vital signs and the nursing notes.  Pertinent labs & imaging results that were available during my care of the patient were reviewed by me and considered in my medical decision making (see chart for details).  Lumbar back pain with radiculopathy affecting the left lower extremity  Urinalysis is negative etiology is consistent with muscular pain, discussed with patient, most likely flare of chronic conditions, already taking narcotic and muscle relaxant daily, history of GI bleed with use of oral NSAIDs therefore will defer, Decadron injection given in office and discussed its effects of blood sugar, type 2 diabetes currently being managed with insulin will defer oral course of prednisone, patient in agreement, Voltaren gel prescribed as an alternative and recommended RICE, heat massage stretching and activity as tolerated advised follow-up with PCP if symptoms continue to persist Final Clinical Impressions(s) / UC Diagnoses   Final diagnoses:  None   Discharge Instructions   None    ED Prescriptions   None    PDMP not reviewed this encounter.   Hans Eden, NP 08/07/22 2008

## 2022-08-07 NOTE — ED Triage Notes (Signed)
Pt c/o low back pain radiating down left leg and also concerned for UTI because urine is dark

## 2022-08-07 NOTE — Discharge Instructions (Addendum)
On exam today pain is directly over your large lower back muscles, most likely compressing the nerves which cause pain to radiate down the leg, this is most likely related to your chronic back pain flared by recent moving of objects  Urinalysis shows negative  You have been given an injection of Decadron here today in office, this will temporarily increase your blood sugar, and you are tracking your blood sugar for your doctor please note that you were taking the steroids today  Due to your history of diabetes we will avoid further steroid use, you are already taking a narcotic (tramadol) and a muscle relaxant(Flexeril)  May use Voltaren gel every 6 hours over the affected area  May use ice or heat over the affected area in 10 to 15-minute intervals  Massage and stretch as tolerated  If your back pain continues to persist please follow-up with your primary doctor for reevaluation

## 2022-08-21 DIAGNOSIS — M1632 Unilateral osteoarthritis resulting from hip dysplasia, left hip: Secondary | ICD-10-CM | POA: Insufficient documentation

## 2022-08-21 DIAGNOSIS — M1612 Unilateral primary osteoarthritis, left hip: Secondary | ICD-10-CM | POA: Insufficient documentation

## 2022-09-17 ENCOUNTER — Encounter (INDEPENDENT_AMBULATORY_CARE_PROVIDER_SITE_OTHER): Payer: Medicare HMO | Admitting: Vascular Surgery

## 2022-09-17 ENCOUNTER — Encounter (INDEPENDENT_AMBULATORY_CARE_PROVIDER_SITE_OTHER): Payer: Medicare HMO

## 2022-10-16 ENCOUNTER — Other Ambulatory Visit: Payer: Self-pay | Admitting: Orthopedic Surgery

## 2022-10-16 DIAGNOSIS — M5442 Lumbago with sciatica, left side: Secondary | ICD-10-CM

## 2022-10-22 ENCOUNTER — Encounter (INDEPENDENT_AMBULATORY_CARE_PROVIDER_SITE_OTHER): Payer: Medicare HMO

## 2022-10-22 ENCOUNTER — Encounter (INDEPENDENT_AMBULATORY_CARE_PROVIDER_SITE_OTHER): Payer: Medicare HMO | Admitting: Vascular Surgery

## 2022-11-07 ENCOUNTER — Ambulatory Visit
Admission: RE | Admit: 2022-11-07 | Discharge: 2022-11-07 | Disposition: A | Discharge status: Home / Self Care | Payer: Medicare HMO | Source: Ambulatory Visit | Attending: Orthopedic Surgery | Admitting: Orthopedic Surgery

## 2022-11-07 DIAGNOSIS — M5442 Lumbago with sciatica, left side: Secondary | ICD-10-CM

## 2022-11-07 MED ORDER — GADOPICLENOL 0.5 MMOL/ML IV SOLN
10.0000 mL | Freq: Once | INTRAVENOUS | Status: AC | PRN
  Administered 2022-11-07: 10 mL via INTRAVENOUS

## 2022-12-17 ENCOUNTER — Other Ambulatory Visit: Payer: Self-pay | Admitting: Gastroenterology

## 2022-12-17 DIAGNOSIS — K319 Disease of stomach and duodenum, unspecified: Secondary | ICD-10-CM

## 2022-12-28 ENCOUNTER — Ambulatory Visit: Payer: Medicare HMO

## 2023-01-18 ENCOUNTER — Encounter
Admission: RE | Admit: 2023-01-18 | Discharge: 2023-01-18 | Disposition: A | Discharge status: Home / Self Care | Payer: Medicare HMO | Source: Ambulatory Visit | Attending: Gastroenterology | Admitting: Gastroenterology

## 2023-01-18 DIAGNOSIS — K319 Disease of stomach and duodenum, unspecified: Secondary | ICD-10-CM | POA: Insufficient documentation

## 2023-01-18 DIAGNOSIS — R1013 Epigastric pain: Secondary | ICD-10-CM | POA: Insufficient documentation

## 2023-01-18 MED ORDER — TECHNETIUM TC 99M SULFUR COLLOID
2.0000 | Freq: Once | INTRAVENOUS | Status: AC | PRN
  Administered 2023-01-18: 2.09 via ORAL

## 2023-02-22 ENCOUNTER — Encounter: Payer: Self-pay | Admitting: *Deleted

## 2023-02-25 ENCOUNTER — Encounter: Payer: Self-pay | Admitting: *Deleted

## 2023-02-26 ENCOUNTER — Other Ambulatory Visit: Payer: Self-pay

## 2023-03-05 ENCOUNTER — Ambulatory Visit: Admission: RE | Admit: 2023-03-05 | Payer: Medicare HMO | Source: Home / Self Care

## 2023-03-05 HISTORY — DX: Unspecified cirrhosis of liver: K74.60

## 2023-03-05 HISTORY — DX: Hematuria, unspecified: R31.9

## 2023-03-05 HISTORY — DX: Fatty (change of) liver, not elsewhere classified: K76.0

## 2023-03-05 HISTORY — DX: Malignant neoplasm of unspecified kidney, except renal pelvis: C64.9

## 2023-03-05 HISTORY — DX: Dyspnea, unspecified: R06.00

## 2023-03-05 SURGERY — COLONOSCOPY WITH PROPOFOL
Anesthesia: General

## 2023-03-28 ENCOUNTER — Ambulatory Visit
Admission: EM | Admit: 2023-03-28 | Discharge: 2023-03-28 | Disposition: A | Discharge status: Home / Self Care | Payer: Medicare HMO | Attending: Emergency Medicine | Admitting: Emergency Medicine

## 2023-03-28 DIAGNOSIS — J988 Other specified respiratory disorders: Secondary | ICD-10-CM

## 2023-03-28 DIAGNOSIS — J40 Bronchitis, not specified as acute or chronic: Secondary | ICD-10-CM

## 2023-03-28 DIAGNOSIS — R051 Acute cough: Secondary | ICD-10-CM

## 2023-03-28 LAB — RESP PANEL BY RT-PCR (FLU A&B, COVID) ARPGX2
Influenza A by PCR: NEGATIVE
Influenza B by PCR: NEGATIVE
SARS Coronavirus 2 by RT PCR: NEGATIVE

## 2023-03-28 MED ORDER — PROMETHAZINE-DM 6.25-15 MG/5ML PO SYRP: 5.0000 mL | ORAL_SOLUTION | Freq: Four times a day (QID) | ORAL | 0 refills | Status: DC | PRN

## 2023-03-28 MED ORDER — AZITHROMYCIN 250 MG PO TABS: 250.0000 mg | ORAL_TABLET | Freq: Every day | ORAL | 0 refills | Status: DC

## 2023-03-28 NOTE — ED Triage Notes (Signed)
Pt c/o cough & congestion x3 days. Has tried mucinex w/o relief.

## 2023-03-28 NOTE — Discharge Instructions (Addendum)
Most likely this is a viral infection. Check my chart for results. Rest,push fluids, may try using over the counter throat lozenges, hot tea, honey for cough. Take cough med as directed, drowsiness precautions, keep an eye on your Blood sugar as illness may make sugars go up.   If no improvement by 04/01/2023 may pick up your antibiotic and take as prescribed, antibiotic may cause upset stomach, resistance, photosensitivity; if you have improvement of symptoms no need to pick up antibiotic.   Follow up with PCP if worsening. Go to ER for chest pain,shortness of breath or worsening issues or concerns.

## 2023-03-28 NOTE — ED Provider Notes (Signed)
[facility]    CSN: [id number] Arrival date & time: 03/28/23  1846      History   Chief Complaint Chief Complaint  Patient presents with   Cough   Congestion    HPI Anthony Fuller is a 62 y.o. male.   Anthony Fuller, 62 year old male pt, for evaluation of cough and congestion x 2 days.  Patient is taking Mucinex for symptoms, reports granddaughter has similar symptoms.  Patient denies smoking  The history is provided by the patient. No language interpreter was used.    Past Medical History:  Diagnosis Date   Acid indigestion 02/14/2012   Anxiety    Arthritis    BIL.KNEES   Bulge of lumbar disc without myelopathy 09/05/2011   Cancer (HCC) 2012   R kidney removed   Cervical stenosis of spine    Cervicalgia    Chest wall pain    Cirrhosis of liver (HCC)    Clostridium difficile infection 02/04/2014   DDD (degenerative disc disease), lumbar    DDD (degenerative disc disease), lumbar    Degeneration of intervertebral disc of lumbar region 08/23/2011   Diabetes mellitus (HCC) 07/24/2011   Diabetes mellitus without complication (HCC)    Diabetic polyneuropathy associated with type 2 diabetes mellitus (HCC)    Diarrhea    DM (diabetes mellitus) type 2, uncontrolled, with ketoacidosis (HCC) 05/30/2015   Dyspepsia    Dyspnea    Enthesopathy of hip 04/11/2015   Fatty liver disease, nonalcoholic 09/12/2014   GERD (gastroesophageal reflux disease)    Headache    Hematuria    Hepatic cirrhosis (HCC) 09/12/2014   Hydrocele    Hydrocele    Hypertension    IBS (irritable bowel syndrome)    Lumbar herniated disc    Lumbar radiculopathy 09/05/2011   Lumbar stenosis    NAFLD (nonalcoholic fatty liver disease)    Peroneal tendon injury, right, initial encounter    Peroneal tendon tear    Radiculopathy    Renal cancer (HCC)    Renal insufficiency    Right Nephrectomy due to RCC.   Sleep apnea    snores   Stenosis of cervical spine    Thoracic and  lumbosacral neuritis 07/24/2011   Thrombocytopathia (HCC)    Thrombocytopenia (HCC)     Patient Active Problem List   Diagnosis Date Noted   Respiratory infection 03/28/2023   Acute cough 03/28/2023   Bronchitis 03/28/2023   Right upper quadrant pain 12/12/2021   Type 2 diabetes mellitus with peripheral neuropathy (HCC) 12/12/2021   Acute gastroenteritis 12/11/2021   Gastroesophageal reflux disease without esophagitis 08/05/2016   Diabetic polyneuropathy associated with type 2 diabetes mellitus (HCC) 02/03/2016   Essential hypertension 07/12/2015   Cervical spinal stenosis 06/24/2015   Sepsis (HCC) 06/16/2015   Left sided abdominal pain 05/30/2015   Chest pain 05/30/2015   CKD (chronic kidney disease), 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   Hyponatremia 05/30/2015   Leukocytosis 05/30/2015   DM (diabetes mellitus) type 2, uncontrolled, with ketoacidosis (HCC) 05/30/2015   Solitary kidney 05/30/2015   Acute pyelonephritis 05/27/2015   Pain due to ureteral stent (HCC) 05/27/2015   Enthesopathy of hip 04/11/2015   Hepatic cirrhosis (HCC) 09/12/2014   Fatty liver disease, nonalcoholic 09/12/2014   Cervical pain 08/06/2014   Clostridium difficile infection 02/04/2014   Diarrhea 12/17/2013   Arthralgia of hip 08/26/2013   H/O urinary disorder 06/29/2013   Acid indigestion 02/14/2012   Low back pain 12/18/2011   Bulge of lumbar  disc without myelopathy 09/05/2011   Lumbar radiculopathy 09/05/2011   Degeneration of intervertebral disc of lumbar region 08/23/2011   Lumbar canal stenosis 08/23/2011   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   Thoracic and lumbosacral neuritis 07/24/2011   Diabetes mellitus (HCC) 07/24/2011    Past Surgical History:  Procedure Laterality Date   BACK SURGERY     cervical fusion C5-7   CARDIAC CATHETERIZATION Right 06/20/2015   Procedure: Left Heart Cath and Coronary Angiography;  Surgeon: Laurier Nancy, MD;  Location: [facility];  Service:  Cardiovascular;  Laterality: Right;   COLONOSCOPY     CYSTOSCOPY W/ RETROGRADES Left 05/18/2015   Procedure: CYSTOSCOPY WITH RETROGRADE PYELOGRAM;  Surgeon: Hildred Laser, MD;  Location: [facility];  Service: Urology;  Laterality: Left;   CYSTOSCOPY WITH STENT PLACEMENT Left 05/18/2015   Procedure: CYSTOSCOPY WITH STENT PLACEMENT;  Surgeon: Hildred Laser, MD;  Location: [facility];  Service: Urology;  Laterality: Left;   CYSTOSCOPY WITH STENT PLACEMENT Left 06/28/2015   Procedure: CYSTOSCOPY WITH STENT PLACEMENT/ EXCHANGE;  Surgeon: Vanna Scotland, MD;  Location: [facility];  Service: Urology;  Laterality: Left;   ESOPHAGOGASTRODUODENOSCOPY (EGD) WITH PROPOFOL N/A 09/03/2016   Procedure: ESOPHAGOGASTRODUODENOSCOPY (EGD) WITH PROPOFOL;  Surgeon: Scot Jun, MD;  Location: [facility];  Service: Endoscopy;  Laterality: N/A;   ESOPHAGOGASTRODUODENOSCOPY ENDOSCOPY N/A 03/14/2018   Procedure: ESOPHAGOGASTRODUODENOSCOPY ENDOSCOPY;  Surgeon: Scot Jun, MD;  Location: [facility] ENDOSCOPY;  Service: Endoscopy;  Laterality: N/A;   HALLUX VALGUS BASE WEDGE Right 08/23/2017   Procedure: HALLUX VALGUS BASE WEDGE;  Surgeon: Gwyneth Revels, DPM;  Location: [facility];  Service: Podiatry;  Laterality: Right;   INSERTION OF MESH  02/27/2021   Procedure: INSERTION OF MESH;  Surgeon: Carolan Shiver, MD;  Location: [facility];  Service: General;;   JOINT REPLACEMENT     right knee    kidney removed Right    2012   NAFLD     NEPHRECTOMY Right 04/2011   ORIF TOE FRACTURE Right 08/23/2017   Procedure: OPEN REDUCTION INTERNAL FIXATION (ORIF) METATARSAL (TOE) FRACTURE;  Surgeon: Gwyneth Revels, DPM;  Location: [facility];  Service: Podiatry;  Laterality: Right;   OSTEOTOMY     SPINE SURGERY     lumbar 2013   URETEROSCOPY WITH HOLMIUM LASER LITHOTRIPSY Left 05/18/2015   Procedure: URETEROSCOPY WITH HOLMIUM LASER LITHOTRIPSY;  Surgeon: Hildred Laser, MD;  Location: [facility];  Service: Urology;   Laterality: Left;   URETEROSCOPY WITH HOLMIUM LASER LITHOTRIPSY Left 06/28/2015   Procedure: URETEROSCOPY WITH HOLMIUM LASER LITHOTRIPSY;  Surgeon: Vanna Scotland, MD;  Location: [facility];  Service: Urology;  Laterality: Left;       Home Medications    Prior to Admission medications   Medication Sig Start Date End Date Taking? Authorizing Provider  albuterol (VENTOLIN HFA) 108 (90 Base) MCG/ACT inhaler SMARTSIG:2 inhalation Via Inhaler Every 6 Hours PRN 10/20/21  Yes [provider]  amLODipine (NORVASC) 5 MG tablet Take 5 mg by mouth daily.   Yes [provider]  azithromycin (ZITHROMAX) 250 MG tablet Take 1 tablet (250 mg total) by mouth daily. Take first 2 tablets together, then 1 every day until finished. 04/01/23  Yes Assunta Pupo, Para March, NP  cetirizine (ZYRTEC) 10 MG tablet Take 10 mg by mouth daily as needed for allergies.   Yes [provider]  clopidogrel (PLAVIX) 75 MG tablet Take 75 mg by mouth daily. 03/27/21  Yes [provider]  diclofenac Sodium (VOLTAREN) 1 %  GEL Apply 4 g topically 4 (four) times daily. 08/07/22  Yes White, Adrienne R, NP  gabapentin (NEURONTIN) 400 MG capsule Take 400 mg by mouth 3 (three) times daily.   Yes [provider]  insulin NPH-regular Human (70-30) 100 UNIT/ML injection Inject 55-60 Units into the skin See admin instructions. Inject 60 units in the morning and 55 units at night   Yes [provider]  ipratropium (ATROVENT) 0.06 % nasal spray Place 2 sprays into both nostrils 3 (three) times daily.   Yes [provider]  losartan (COZAAR) 50 MG tablet Take 50 mg by mouth daily.   Yes [provider]  metFORMIN (GLUCOPHAGE-XR) 500 MG 24 hr tablet Take 500-1,000 mg by mouth See admin instructions. Take 1000 mg in the morning and 500 mg in the evening 12/05/15  Yes [provider]  methocarbamol (ROBAXIN) 500 MG tablet Take 500 mg by mouth every 8 (eight) hours as needed for  muscle spasms.   Yes [provider]  metoprolol succinate (TOPROL-XL) 25 MG 24 hr tablet Take 25 mg by mouth daily. 12/06/21  Yes [provider]  omeprazole (PRILOSEC) 40 MG capsule Take 40 mg by mouth daily.   Yes [provider]  ondansetron (ZOFRAN-ODT) 4 MG disintegrating tablet Take 1 tablet (4 mg total) by mouth every 8 (eight) hours as needed for nausea or vomiting. 12/18/21  Yes Willy Eddy, MD  promethazine-dextromethorphan (PROMETHAZINE-DM) 6.25-15 MG/5ML syrup Take 5 mLs by mouth 4 (four) times daily as needed. 03/28/23  Yes Arch Methot, Para March, NP  rosuvastatin (CRESTOR) 5 MG tablet Take 5 mg by mouth daily. 10/26/21  Yes [provider]  tiZANidine (ZANAFLEX) 4 MG capsule Take 4 mg by mouth 3 (three) times daily.   Yes [provider]  traMADol (ULTRAM) 50 MG tablet Take 50 mg by mouth every 6 (six) hours as needed for moderate pain. for pain   Yes [provider]  furosemide (LASIX) 20 MG tablet Take 20 mg by mouth daily. Patient not taking: Reported on 02/26/2023    [provider]  lidocaine (XYLOCAINE) 5 % ointment Apply 1 application topically 2 (two) times daily. Patient not taking: Reported on 12/11/2021    [provider]  Multiple Vitamin (MULTIVITAMIN WITH MINERALS) TABS tablet Take 1 tablet by mouth daily. Patient not taking: Reported on 02/26/2023    [provider]  tadalafil (CIALIS) 20 MG tablet Take 20 mg by mouth daily as needed for erectile dysfunction. Patient not taking: Reported on 02/26/2023    [provider]    Family History Family History  Problem Relation Age of Onset   Diabetes Mother    Hypertension Mother    Asthma Mother    Heart disease Father    Kidney disease Father    Diabetes Father    Stroke Father     Social History Social History   Tobacco Use   Smoking status: Never   Smokeless tobacco: Never  Vaping Use   Vaping status: Never Used   Substance Use Topics   Alcohol use: Not Currently   Drug use: No     Allergies   Hydromorphone, Aspirin, Nsaids, and Floxin [ofloxacin]   Review of Systems Review of Systems  Constitutional:  Negative for fever.  HENT:  Positive for congestion.   Respiratory:  Positive for cough.   All other systems reviewed and are negative.    Physical Exam Triage Vital Signs ED Triage Vitals  Encounter Vitals Group  BP      Systolic BP Percentile      Diastolic BP Percentile      Pulse      Resp      Temp      Temp src      SpO2      Weight      Height      Head Circumference      Peak Flow      Pain Score      Pain Loc      Pain Education      Exclude from Growth Chart    No data found.  Updated Vital Signs BP (!) 144/80 (BP Location: Left Arm)   Pulse 85   Temp 98.4 F (36.9 C) (Oral)   Resp 16   Ht 6\' 2"  (1.88 m)   Wt (!) 314 lb (142.4 kg)   SpO2 94%   BMI 40.32 kg/m   Visual Acuity Right Eye Distance:   Left Eye Distance:   Bilateral Distance:    Right Eye Near:   Left Eye Near:    Bilateral Near:     Physical Exam Vitals and nursing note reviewed.  HENT:     Head: Normocephalic.     Right Ear: Tympanic membrane is retracted.     Left Ear: Tympanic membrane is retracted.     Nose: Mucosal edema and congestion present.     Right Sinus: No maxillary sinus tenderness or frontal sinus tenderness.     Left Sinus: No maxillary sinus tenderness or frontal sinus tenderness.     Mouth/Throat:     Lips: Pink.     Mouth: Mucous membranes are moist.     Pharynx: Oropharynx is clear. Uvula midline. Postnasal drip present.  Cardiovascular:     Rate and Rhythm: Normal rate and regular rhythm.     Pulses: Normal pulses.     Heart sounds: Normal heart sounds.  Pulmonary:     Effort: Pulmonary effort is normal.     Breath sounds: Normal breath sounds and air entry.  Neurological:     General: No focal deficit present.     Mental Status: He is alert and  oriented to person, place, and time.     GCS: GCS eye subscore is 4. GCS verbal subscore is 5. GCS motor subscore is 6.     Cranial Nerves: No cranial nerve deficit.     Sensory: No sensory deficit.      UC Treatments / Results  Labs (all labs ordered are listed, but only abnormal results are displayed) Labs Reviewed  RESP PANEL BY RT-PCR (FLU A&B, COVID) [id number]    EKG   Radiology No results found.  Procedures Procedures (including critical care time)  Medications Ordered in UC Medications - No data to display  Initial Impression / Assessment and Plan / UC Course  I have reviewed the triage vital signs and the nursing notes.  Pertinent labs & imaging results that were available during my care of the patient were reviewed by me and considered in my medical decision making (see chart for details).    Discussed exam findings and plan of care with patient, strict go to ER precautions given.   Patient verbalized understanding to this provider.  Ddx: Acute respiratory infection, acute cough, bronchitis, viral illness Final Clinical Impressions(s) / UC Diagnoses   Final diagnoses:  Respiratory infection  Acute cough  Bronchitis     Discharge Instructions  Most likely this is a viral infection. Check my chart for results. Rest,push fluids, may try using over the counter throat lozenges, hot tea, honey for cough. Take cough med as directed, drowsiness precautions, keep an eye on your Blood sugar as illness may make sugars go up.   If no improvement by 04/01/2023 may pick up your antibiotic and take as prescribed, antibiotic may cause upset stomach, resistance, photosensitivity; if you have improvement of symptoms no need to pick up antibiotic.   Follow up with PCP if worsening. Go to ER for chest pain,shortness of breath or worsening issues or concerns.     ED Prescriptions     Medication Sig Dispense Auth. Provider   azithromycin (ZITHROMAX) 250 MG tablet Take  1 tablet (250 mg total) by mouth daily. Take first 2 tablets together, then 1 every day until finished. 6 tablet Sander Remedios, NP   promethazine-dextromethorphan (PROMETHAZINE-DM) 6.25-15 MG/5ML syrup Take 5 mLs by mouth 4 (four) times daily as needed. 118 mL Pattricia Weiher, Para March, NP      PDMP not reviewed this encounter.   Clancy Gourd, NP 03/28/23 2003

## 2023-05-24 ENCOUNTER — Ambulatory Visit
Admission: EM | Admit: 2023-05-24 | Discharge: 2023-05-24 | Disposition: A | Discharge status: Home / Self Care | Payer: Medicare HMO | Attending: Emergency Medicine | Admitting: Emergency Medicine

## 2023-05-24 ENCOUNTER — Ambulatory Visit (INDEPENDENT_AMBULATORY_CARE_PROVIDER_SITE_OTHER): Payer: Medicare HMO

## 2023-05-24 DIAGNOSIS — J988 Other specified respiratory disorders: Secondary | ICD-10-CM | POA: Diagnosis present

## 2023-05-24 DIAGNOSIS — J069 Acute upper respiratory infection, unspecified: Secondary | ICD-10-CM | POA: Diagnosis present

## 2023-05-24 LAB — SARS CORONAVIRUS 2 BY RT PCR: SARS Coronavirus 2 by RT PCR: NEGATIVE

## 2023-05-24 MED ORDER — AEROCHAMBER MV MISC: 2 refills | Status: DC

## 2023-05-24 MED ORDER — BENZONATATE 100 MG PO CAPS: 200.0000 mg | ORAL_CAPSULE | Freq: Three times a day (TID) | ORAL | 0 refills | Status: DC

## 2023-05-24 MED ORDER — IPRATROPIUM BROMIDE 0.06 % NA SOLN: 2.0000 | Freq: Three times a day (TID) | NASAL | 0 refills | Status: DC

## 2023-05-24 MED ORDER — ALBUTEROL SULFATE HFA 108 (90 BASE) MCG/ACT IN AERS: 2.0000 | INHALATION_SPRAY | RESPIRATORY_TRACT | 0 refills | Status: DC | PRN

## 2023-05-24 MED ORDER — PROMETHAZINE-DM 6.25-15 MG/5ML PO SYRP: 5.0000 mL | ORAL_SOLUTION | Freq: Four times a day (QID) | ORAL | 0 refills | Status: DC | PRN

## 2023-05-24 NOTE — ED Provider Notes (Signed)
 [facility]    CSN: [id number] Arrival date & time: 05/24/23  9171      History   Chief Complaint Chief Complaint  Patient presents with   Cough    HPI Anthony Fuller is a 63 y.o. male.   HPI  63 year old male with a past medical history that is significant for diabetes, hypertension, sleep apnea, renal carcinoma with right nephrectomy, cirrhosis, GERD, and arthritis presents for evaluation of 2 to 3 days worth of respiratory symptoms that include nasal congestion and sinus pain with a thick yellow nasal discharge, postnasal drip with a scratchy throat, cough that is intermittently productive for the same thick yellow mucus with associated shortness of breath and wheezing.  He denies any fever.  His daughter and granddaughter both have a runny nose.  He denies travel.  Past Medical History:  Diagnosis Date   Acid indigestion 02/14/2012   Anxiety    Arthritis    BIL.KNEES   Bulge of lumbar disc without myelopathy 09/05/2011   Cancer (HCC) 2012   R kidney removed   Cervical stenosis of spine    Cervicalgia    Chest wall pain    Cirrhosis of liver (HCC)    Clostridium difficile infection 02/04/2014   DDD (degenerative disc disease), lumbar    DDD (degenerative disc disease), lumbar    Degeneration of intervertebral disc of lumbar region 08/23/2011   Diabetes mellitus (HCC) 07/24/2011   Diabetes mellitus without complication (HCC)    Diabetic polyneuropathy associated with type 2 diabetes mellitus (HCC)    Diarrhea    DM (diabetes mellitus) type 2, uncontrolled, with ketoacidosis (HCC) 05/30/2015   Dyspepsia    Dyspnea    Enthesopathy of hip 04/11/2015   Fatty liver disease, nonalcoholic 09/12/2014   GERD (gastroesophageal reflux disease)    Headache    Hematuria    Hepatic cirrhosis (HCC) 09/12/2014   Hydrocele    Hydrocele    Hypertension    IBS (irritable bowel syndrome)    Lumbar herniated disc    Lumbar radiculopathy 09/05/2011   Lumbar  stenosis    NAFLD (nonalcoholic fatty liver disease)    Peroneal tendon injury, right, initial encounter    Peroneal tendon tear    Radiculopathy    Renal cancer (HCC)    Renal insufficiency    Right Nephrectomy due to RCC.   Sleep apnea    snores   Stenosis of cervical spine    Thoracic and lumbosacral neuritis 07/24/2011   Thrombocytopathia (HCC)    Thrombocytopenia (HCC)     Patient Active Problem List   Diagnosis Date Noted   Respiratory infection 03/28/2023   Acute cough 03/28/2023   Bronchitis 03/28/2023   Right upper quadrant pain 12/12/2021   Type 2 diabetes mellitus with peripheral neuropathy (HCC) 12/12/2021   Acute gastroenteritis 12/11/2021   Gastroesophageal reflux disease without esophagitis 08/05/2016   Diabetic polyneuropathy associated with type 2 diabetes mellitus (HCC) 02/03/2016   Essential hypertension 07/12/2015   Cervical spinal stenosis 06/24/2015   Sepsis (HCC) 06/16/2015   Left sided abdominal pain 05/30/2015   Chest pain 05/30/2015   CKD (chronic kidney disease), 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   Hyponatremia 05/30/2015   Leukocytosis 05/30/2015   DM (diabetes mellitus) type 2, uncontrolled, with ketoacidosis (HCC) 05/30/2015   Solitary kidney 05/30/2015   Acute pyelonephritis 05/27/2015   Pain due to ureteral stent (HCC) 05/27/2015   Enthesopathy of hip 04/11/2015   Hepatic cirrhosis (HCC) 09/12/2014   Fatty liver disease, nonalcoholic  09/12/2014   Cervical pain 08/06/2014   Clostridium difficile infection 02/04/2014   Diarrhea 12/17/2013   Arthralgia of hip 08/26/2013   H/O urinary disorder 06/29/2013   Acid indigestion 02/14/2012   Low back pain 12/18/2011   Bulge of lumbar disc without myelopathy 09/05/2011   Lumbar radiculopathy 09/05/2011   Degeneration of intervertebral disc of lumbar region 08/23/2011   Lumbar canal stenosis 08/23/2011   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   Thoracic and lumbosacral neuritis 07/24/2011    Diabetes mellitus (HCC) 07/24/2011    Past Surgical History:  Procedure Laterality Date   BACK SURGERY     cervical fusion C5-7   CARDIAC CATHETERIZATION Right 06/20/2015   Procedure: Left Heart Cath and Coronary Angiography;  Surgeon: Denyse DELENA Bathe, MD;  Location: [facility];  Service: Cardiovascular;  Laterality: Right;   COLONOSCOPY     CYSTOSCOPY W/ RETROGRADES Left 05/18/2015   Procedure: CYSTOSCOPY WITH RETROGRADE PYELOGRAM;  Surgeon: Redell Lynwood Napoleon, MD;  Location: [facility];  Service: Urology;  Laterality: Left;   CYSTOSCOPY WITH STENT PLACEMENT Left 05/18/2015   Procedure: CYSTOSCOPY WITH STENT PLACEMENT;  Surgeon: Redell Lynwood Napoleon, MD;  Location: [facility];  Service: Urology;  Laterality: Left;   CYSTOSCOPY WITH STENT PLACEMENT Left 06/28/2015   Procedure: CYSTOSCOPY WITH STENT PLACEMENT/ EXCHANGE;  Surgeon: Rosina Riis, MD;  Location: [facility];  Service: Urology;  Laterality: Left;   ESOPHAGOGASTRODUODENOSCOPY (EGD) WITH PROPOFOL  N/A 09/03/2016   Procedure: ESOPHAGOGASTRODUODENOSCOPY (EGD) WITH PROPOFOL ;  Surgeon: Lamar ONEIDA Holmes, MD;  Location: [facility];  Service: Endoscopy;  Laterality: N/A;   ESOPHAGOGASTRODUODENOSCOPY ENDOSCOPY N/A 03/14/2018   Procedure: ESOPHAGOGASTRODUODENOSCOPY ENDOSCOPY;  Surgeon: Holmes Lamar ONEIDA, MD;  Location: [facility]OSCOPY;  Service: Endoscopy;  Laterality: N/A;   HALLUX VALGUS BASE WEDGE Right 08/23/2017   Procedure: HALLUX VALGUS BASE WEDGE;  Surgeon: Ashley Soulier, DPM;  Location: [facility];  Service: Podiatry;  Laterality: Right;   INSERTION OF MESH  02/27/2021   Procedure: INSERTION OF MESH;  Surgeon: Rodolph Romano, MD;  Location: [facility];  Service: General;;   JOINT REPLACEMENT     right knee    kidney removed Right    2012   NAFLD     NEPHRECTOMY Right 04/2011   ORIF TOE FRACTURE Right 08/23/2017   Procedure: OPEN REDUCTION INTERNAL FIXATION (ORIF) METATARSAL (TOE) FRACTURE;  Surgeon: Ashley Soulier,  DPM;  Location: [facility];  Service: Podiatry;  Laterality: Right;   OSTEOTOMY     SPINE SURGERY     lumbar 2013   URETEROSCOPY WITH HOLMIUM LASER LITHOTRIPSY Left 05/18/2015   Procedure: URETEROSCOPY WITH HOLMIUM LASER LITHOTRIPSY;  Surgeon: Redell Lynwood Napoleon, MD;  Location: [facility];  Service: Urology;  Laterality: Left;   URETEROSCOPY WITH HOLMIUM LASER LITHOTRIPSY Left 06/28/2015   Procedure: URETEROSCOPY WITH HOLMIUM LASER LITHOTRIPSY;  Surgeon: Rosina Riis, MD;  Location: [facility];  Service: Urology;  Laterality: Left;       Home Medications    Prior to Admission medications   Medication Sig Start Date End Date Taking? Authorizing Provider  albuterol  (VENTOLIN  HFA) 108 (90 Base) MCG/ACT inhaler Inhale 2 puffs into the lungs every 4 (four) hours as needed. 05/24/23  Yes Bernardino Ditch, NP  benzonatate  (TESSALON ) 100 MG capsule Take 2 capsules (200 mg total) by mouth every 8 (eight) hours. 05/24/23  Yes Bernardino Ditch, NP  cetirizine (ZYRTEC) 10 MG tablet Take 10 mg by mouth daily as needed for allergies.   Yes [provider]  clopidogrel  (  PLAVIX ) 75 MG tablet Take 75 mg by mouth daily. 03/27/21  Yes [provider]  gabapentin  (NEURONTIN ) 400 MG capsule Take 400 mg by mouth 3 (three) times daily.   Yes [provider]  insulin  NPH-regular Human (70-30) 100 UNIT/ML injection Inject 55-60 Units into the skin See admin instructions. Inject 60 units in the morning and 55 units at night   Yes [provider]  losartan (COZAAR) 50 MG tablet Take 50 mg by mouth daily.   Yes [provider]  metFORMIN (GLUCOPHAGE-XR) 500 MG 24 hr tablet Take 500-1,000 mg by mouth See admin instructions. Take 1000 mg in the morning and 500 mg in the evening 12/05/15  Yes [provider]  methocarbamol (ROBAXIN) 500 MG tablet Take 500 mg by mouth every 8 (eight) hours as needed for muscle spasms.   Yes [provider]  rosuvastatin  (CRESTOR ) 5 MG tablet  Take 5 mg by mouth daily. 10/26/21  Yes [provider]  Spacer/Aero-Holding Chambers (AEROCHAMBER MV) inhaler Use as instructed 05/24/23  Yes Bernardino Ditch, NP  tiZANidine  (ZANAFLEX ) 4 MG capsule Take 4 mg by mouth 3 (three) times daily.   Yes [provider]  traMADol  (ULTRAM ) 50 MG tablet Take 50 mg by mouth every 6 (six) hours as needed for moderate pain. for pain   Yes [provider]  ipratropium (ATROVENT ) 0.06 % nasal spray Place 2 sprays into both nostrils 3 (three) times daily. 05/24/23   Bernardino Ditch, NP  promethazine -dextromethorphan  (PROMETHAZINE -DM) 6.25-15 MG/5ML syrup Take 5 mLs by mouth 4 (four) times daily as needed. 05/24/23   Bernardino Ditch, NP    Family History Family History  Problem Relation Age of Onset   Diabetes Mother    Hypertension Mother    Asthma Mother    Heart disease Father    Kidney disease Father    Diabetes Father    Stroke Father     Social History Social History   Tobacco Use   Smoking status: Never   Smokeless tobacco: Never  Vaping Use   Vaping status: Never Used  Substance Use Topics   Alcohol use: Not Currently   Drug use: No     Allergies   Hydromorphone, Aspirin , Nsaids, and Floxin [ofloxacin]   Review of Systems Review of Systems  Constitutional:  Negative for fever.  HENT:  Positive for congestion, ear pain, postnasal drip, rhinorrhea, sinus pain and sore throat.   Respiratory:  Positive for cough, shortness of breath and wheezing.      Physical Exam Triage Vital Signs ED Triage Vitals  Encounter Vitals Group     BP 05/24/23 0846 (!) 166/83     Systolic BP Percentile --      Diastolic BP Percentile --      Pulse Rate 05/24/23 0841 75     Resp --      Temp 05/24/23 0846 99.1 F (37.3 C)     Temp Source 05/24/23 0846 Oral     SpO2 05/24/23 0841 97 %     Weight 05/24/23 0842 (!) 315 lb (142.9 kg)     Height 05/24/23 0842 6' 2 (1.88 m)     Head Circumference --      Peak Flow --      Pain  Score 05/24/23 0842 4     Pain Loc --      Pain Education --      Exclude from Growth Chart --    No data found.  Updated Vital  Signs BP (!) 166/83 (BP Location: Left Arm)   Pulse 80   Temp 99.1 F (37.3 C) (Oral)   Ht 6' 2 (1.88 m)   Wt (!) 315 lb (142.9 kg)   SpO2 95%   BMI 40.44 kg/m   Visual Acuity Right Eye Distance:   Left Eye Distance:   Bilateral Distance:    Right Eye Near:   Left Eye Near:    Bilateral Near:     Physical Exam Vitals and nursing note reviewed.  Constitutional:      Appearance: Normal appearance. He is not ill-appearing.  HENT:     Head: Normocephalic and atraumatic.     Right Ear: Tympanic membrane, ear canal and external ear normal. There is no impacted cerumen.     Left Ear: Tympanic membrane, ear canal and external ear normal. There is no impacted cerumen.     Nose: Congestion and rhinorrhea present.     Comments: Nasal mucosa is erythematous and mildly edematous with scant clear discharge in both nares.    Mouth/Throat:     Mouth: Mucous membranes are moist.     Pharynx: Oropharynx is clear. No oropharyngeal exudate or posterior oropharyngeal erythema.  Cardiovascular:     Rate and Rhythm: Normal rate and regular rhythm.     Pulses: Normal pulses.     Heart sounds: Normal heart sounds. No murmur heard.    No friction rub. No gallop.  Pulmonary:     Effort: Pulmonary effort is normal.     Breath sounds: Normal breath sounds. No wheezing, rhonchi or rales.  Musculoskeletal:     Cervical back: Normal range of motion and neck supple. No tenderness.  Lymphadenopathy:     Cervical: No cervical adenopathy.  Skin:    General: Skin is warm and dry.     Capillary Refill: Capillary refill takes less than 2 seconds.     Findings: No rash.  Neurological:     General: No focal deficit present.     Mental Status: He is alert and oriented to person, place, and time.      UC Treatments / Results  Labs (all labs ordered are listed, but  only abnormal results are displayed) Labs Reviewed  SARS CORONAVIRUS 2 BY RT PCR    EKG   Radiology DG Chest 2 View Result Date: 05/24/2023 CLINICAL DATA:  Cough x 2 to 3 days with associated shortness of breath and wheezing. EXAM: CHEST - 2 VIEW COMPARISON:  12/18/2021. FINDINGS: Bilateral lung fields are clear. Bilateral costophrenic angles are clear. Elevated right hemidiaphragm. Normal cardio-mediastinal silhouette. No acute osseous abnormalities. Lower cervical spinal fixation hardware noted. The soft tissues are within normal limits. IMPRESSION: No active cardiopulmonary disease. Electronically Signed   By: Ree Molt M.D.   On: 05/24/2023 09:23    Procedures Procedures (including critical care time)  Medications Ordered in UC Medications - No data to display  Initial Impression / Assessment and Plan / UC Course  I have reviewed the triage vital signs and the nursing notes.  Pertinent labs & imaging results that were available during my care of the patient were reviewed by me and considered in my medical decision making (see chart for details).   Patient is a nontoxic-appearing 63 year old male presenting for evaluation of 2 to 3 days with the respiratory symptoms as outlined in HPI above.  Patient's most significant symptoms consist of bilateral ear pain, cough, shortness breath, and wheezing.  His physical exam reveals pearly gray tympanic membranes  with no effusion.  Both EACs are clear.  Cardiopulmonary exam reveals clear lung sounds in all fields.  He is able to speak in full sentence without dyspnea or tachypnea.  Room air oxygen saturation is 95%.  Patient's temp is mildly elevated to 99.1.  He reports that he is concerned about possibly giving something to his granddaughter.  I explained to him that he is outside the therapeutic window for antivirals for influenza so I will not test him for that at this time.  I will order a COVID PCR as well as a chest x-ray to eval for any  acute cardiopulmonary pathology.  Chest x-ray independently reviewed and evaluated by me.  Impression: Pulmonary vasculature is prominent but lung fields are well aerated without evidence of overt effusion or infiltrate.  Cardiomediastinal silhouette appears normal.  Radiology overread is pending. Radiology impression states no active cardiopulmonary disease.  COVID PCR is negative.  I will discharge patient home with a diagnosis of viral URI with a cough with a prescription for Atrovent  nasal spray to open the nasal congestion, Tessalon  Perles and Promethazine  DM cough syrup for cough and congestion.  I will prescribe an albuterol  inhaler with a spacer and he can use 1 to 2 puffs every 4-6 hours as needed for shortness of breath or wheezing.   Final Clinical Impressions(s) / UC Diagnoses   Final diagnoses:  Respiratory infection  Viral URI with cough     Discharge Instructions      Your chest x-ray did not demonstrate any evidence of pneumonia and your COVID test was negative.  I do believe you have a viral respiratory infection causing your symptoms.  Use the albuterol  inhaler, with the spacer, 1 to 2 puffs every 4-6 hours as needed for shortness of breath or wheezing.  Take over-the-counter Tylenol  and/or ibuprofen according the package instructions as needed for fever or pain.  Use the Atrovent  nasal spray, 2 squirts in each nostril every 6 hours, as needed for runny nose and postnasal drip.  Use the Tessalon  Perles every 8 hours during the day.  Take them with a small sip of water.  They may give you some numbness to the base of your tongue or a metallic taste in your mouth, this is normal.  Use the Promethazine  DM cough syrup at bedtime for cough and congestion.  It will make you drowsy so do not take it during the day.  Return for reevaluation or see your primary care provider for any new or worsening symptoms.      ED Prescriptions     Medication Sig Dispense Auth.  Provider   ipratropium (ATROVENT ) 0.06 % nasal spray Place 2 sprays into both nostrils 3 (three) times daily. 15 mL Bernardino Ditch, NP   promethazine -dextromethorphan  (PROMETHAZINE -DM) 6.25-15 MG/5ML syrup Take 5 mLs by mouth 4 (four) times daily as needed. 118 mL Bernardino Ditch, NP   albuterol  (VENTOLIN  HFA) 108 (90 Base) MCG/ACT inhaler Inhale 2 puffs into the lungs every 4 (four) hours as needed. 18 g Bernardino Ditch, NP   Spacer/Aero-Holding Chambers (AEROCHAMBER MV) inhaler Use as instructed 1 each Bernardino Ditch, NP   benzonatate  (TESSALON ) 100 MG capsule Take 2 capsules (200 mg total) by mouth every 8 (eight) hours. 21 capsule Bernardino Ditch, NP      PDMP not reviewed this encounter.   Bernardino Ditch, NP 05/24/23 1000

## 2023-05-24 NOTE — Discharge Instructions (Addendum)
 Your chest x-ray did not demonstrate any evidence of pneumonia and your COVID test was negative.  I do believe you have a viral respiratory infection causing your symptoms.  Use the albuterol  inhaler, with the spacer, 1 to 2 puffs every 4-6 hours as needed for shortness of breath or wheezing.  Take over-the-counter Tylenol  and/or ibuprofen according the package instructions as needed for fever or pain.  Use the Atrovent  nasal spray, 2 squirts in each nostril every 6 hours, as needed for runny nose and postnasal drip.  Use the Tessalon  Perles every 8 hours during the day.  Take them with a small sip of water.  They may give you some numbness to the base of your tongue or a metallic taste in your mouth, this is normal.  Use the Promethazine  DM cough syrup at bedtime for cough and congestion.  It will make you drowsy so do not take it during the day.  Return for reevaluation or see your primary care provider for any new or worsening symptoms.

## 2023-05-24 NOTE — ED Triage Notes (Addendum)
 Pt c/o chest congestion, Bilateral ear pain, Facial pain, cough, SOB x3days

## 2023-10-01 ENCOUNTER — Encounter (INDEPENDENT_AMBULATORY_CARE_PROVIDER_SITE_OTHER): Payer: Self-pay

## 2023-10-17 ENCOUNTER — Ambulatory Visit
Admission: EM | Admit: 2023-10-17 | Discharge: 2023-10-17 | Disposition: A | Discharge status: Home / Self Care | Attending: Family Medicine | Admitting: Family Medicine

## 2023-10-17 ENCOUNTER — Ambulatory Visit: Payer: Self-pay | Admitting: Family Medicine

## 2023-10-17 DIAGNOSIS — J069 Acute upper respiratory infection, unspecified: Secondary | ICD-10-CM

## 2023-10-17 DIAGNOSIS — H66001 Acute suppurative otitis media without spontaneous rupture of ear drum, right ear: Secondary | ICD-10-CM

## 2023-10-17 LAB — RESP PANEL BY RT-PCR (FLU A&B, COVID) ARPGX2
Influenza A by PCR: NEGATIVE
Influenza B by PCR: NEGATIVE
SARS Coronavirus 2 by RT PCR: NEGATIVE

## 2023-10-17 LAB — GROUP A STREP BY PCR: Group A Strep by PCR: NOT DETECTED

## 2023-10-17 MED ORDER — IPRATROPIUM BROMIDE 0.06 % NA SOLN: 2.0000 | Freq: Four times a day (QID) | NASAL | 0 refills | Status: DC

## 2023-10-17 MED ORDER — AMOXICILLIN-POT CLAVULANATE 875-125 MG PO TABS: 1.0000 | ORAL_TABLET | Freq: Two times a day (BID) | ORAL | 0 refills | Status: DC

## 2023-10-17 NOTE — ED Provider Notes (Signed)
 [facility]    CSN: [id number] Arrival date & time: 10/17/23  1448      History   Chief Complaint Chief Complaint  Patient presents with   Ear Pain   Nasal Congestion         HPI Anthony Fuller is a 63 y.o. male.   HPI  History obtained from the patient. Anthony Fuller presents for sore throat, ear pain, nasal congestion with post-nasal drip with cough that started 3 days ago. No fever but has been sweating at night. No vomiting or diarrhea. Has nausea and chronic belly pain.  Has chest tightness and shortness of breath. Has to sit down to catch his breathe. His wife has similar sx.  They have a 33 yo grand-daughter and duaghter who are that live with them that are sick too.   Taking Tylenol  without relief.       Past Medical History:  Diagnosis Date   Acid indigestion 02/14/2012   Anxiety    Arthritis    BIL.KNEES   Bulge of lumbar disc without myelopathy 09/05/2011   Cancer (HCC) 2012   R kidney removed   Cervical stenosis of spine    Cervicalgia    Chest wall pain    Cirrhosis of liver (HCC)    Clostridium difficile infection 02/04/2014   DDD (degenerative disc disease), lumbar    DDD (degenerative disc disease), lumbar    Degeneration of intervertebral disc of lumbar region 08/23/2011   Diabetes mellitus (HCC) 07/24/2011   Diabetes mellitus without complication (HCC)    Diabetic polyneuropathy associated with type 2 diabetes mellitus (HCC)    Diarrhea    DM (diabetes mellitus) type 2, uncontrolled, with ketoacidosis (HCC) 05/30/2015   Dyspepsia    Dyspnea    Enthesopathy of hip 04/11/2015   Fatty liver disease, nonalcoholic 09/12/2014   GERD (gastroesophageal reflux disease)    Headache    Hematuria    Hepatic cirrhosis (HCC) 09/12/2014   Hydrocele    Hydrocele    Hypertension    IBS (irritable bowel syndrome)    Lumbar herniated disc    Lumbar radiculopathy 09/05/2011   Lumbar stenosis    NAFLD (nonalcoholic fatty liver disease)    Peroneal  tendon injury, right, initial encounter    Peroneal tendon tear    Radiculopathy    Renal cancer (HCC)    Renal insufficiency    Right Nephrectomy due to RCC.   Sleep apnea    snores   Stenosis of cervical spine    Thoracic and lumbosacral neuritis 07/24/2011   Thrombocytopathia (HCC)    Thrombocytopenia (HCC)     Patient Active Problem List   Diagnosis Date Noted   Respiratory infection 03/28/2023   Acute cough 03/28/2023   Bronchitis 03/28/2023   Right upper quadrant pain 12/12/2021   Type 2 diabetes mellitus with peripheral neuropathy (HCC) 12/12/2021   Acute gastroenteritis 12/11/2021   Gastroesophageal reflux disease without esophagitis 08/05/2016   Diabetic polyneuropathy associated with type 2 diabetes mellitus (HCC) 02/03/2016   Essential hypertension 07/12/2015   Cervical spinal stenosis 06/24/2015   Sepsis (HCC) 06/16/2015   Left sided abdominal pain 05/30/2015   Chest pain 05/30/2015   CKD (chronic kidney disease), 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   Hyponatremia 05/30/2015   Leukocytosis 05/30/2015   DM (diabetes mellitus) type 2, uncontrolled, with ketoacidosis (HCC) 05/30/2015   Solitary kidney 05/30/2015   Acute pyelonephritis 05/27/2015   Pain due to ureteral stent (HCC) 05/27/2015   Enthesopathy of hip 04/11/2015  Hepatic cirrhosis (HCC) 09/12/2014   Fatty liver disease, nonalcoholic 09/12/2014   Cervical pain 08/06/2014   Clostridium difficile infection 02/04/2014   Diarrhea 12/17/2013   Arthralgia of hip 08/26/2013   H/O urinary disorder 06/29/2013   Acid indigestion 02/14/2012   Low back pain 12/18/2011   Bulge of lumbar disc without myelopathy 09/05/2011   Lumbar radiculopathy 09/05/2011   Degeneration of intervertebral disc of lumbar region 08/23/2011   Lumbar canal stenosis 08/23/2011   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   Thoracic and lumbosacral neuritis 07/24/2011   Diabetes mellitus (HCC) 07/24/2011    Past Surgical History:   Procedure Laterality Date   BACK SURGERY     cervical fusion C5-7   CARDIAC CATHETERIZATION Right 06/20/2015   Procedure: Left Heart Cath and Coronary Angiography;  Surgeon: Denyse DELENA Bathe, MD;  Location: [facility];  Service: Cardiovascular;  Laterality: Right;   COLONOSCOPY     CYSTOSCOPY W/ RETROGRADES Left 05/18/2015   Procedure: CYSTOSCOPY WITH RETROGRADE PYELOGRAM;  Surgeon: Redell Lynwood Napoleon, MD;  Location: [facility];  Service: Urology;  Laterality: Left;   CYSTOSCOPY WITH STENT PLACEMENT Left 05/18/2015   Procedure: CYSTOSCOPY WITH STENT PLACEMENT;  Surgeon: Redell Lynwood Napoleon, MD;  Location: [facility];  Service: Urology;  Laterality: Left;   CYSTOSCOPY WITH STENT PLACEMENT Left 06/28/2015   Procedure: CYSTOSCOPY WITH STENT PLACEMENT/ EXCHANGE;  Surgeon: Rosina Riis, MD;  Location: [facility];  Service: Urology;  Laterality: Left;   ESOPHAGOGASTRODUODENOSCOPY (EGD) WITH PROPOFOL  N/A 09/03/2016   Procedure: ESOPHAGOGASTRODUODENOSCOPY (EGD) WITH PROPOFOL ;  Surgeon: Lamar ONEIDA Holmes, MD;  Location: [facility];  Service: Endoscopy;  Laterality: N/A;   ESOPHAGOGASTRODUODENOSCOPY ENDOSCOPY N/A 03/14/2018   Procedure: ESOPHAGOGASTRODUODENOSCOPY ENDOSCOPY;  Surgeon: Holmes Lamar ONEIDA, MD;  Location: [facility] ENDOSCOPY;  Service: Endoscopy;  Laterality: N/A;   HALLUX VALGUS BASE WEDGE Right 08/23/2017   Procedure: HALLUX VALGUS BASE WEDGE;  Surgeon: Ashley Soulier, DPM;  Location: [facility];  Service: Podiatry;  Laterality: Right;   INSERTION OF MESH  02/27/2021   Procedure: INSERTION OF MESH;  Surgeon: Rodolph Romano, MD;  Location: [facility];  Service: General;;   JOINT REPLACEMENT     right knee    kidney removed Right    2012   NAFLD     NEPHRECTOMY Right 04/2011   ORIF TOE FRACTURE Right 08/23/2017   Procedure: OPEN REDUCTION INTERNAL FIXATION (ORIF) METATARSAL (TOE) FRACTURE;  Surgeon: Ashley Soulier, DPM;  Location: [facility];  Service: Podiatry;  Laterality: Right;    OSTEOTOMY     SPINE SURGERY     lumbar 2013   URETEROSCOPY WITH HOLMIUM LASER LITHOTRIPSY Left 05/18/2015   Procedure: URETEROSCOPY WITH HOLMIUM LASER LITHOTRIPSY;  Surgeon: Redell Lynwood Napoleon, MD;  Location: [facility];  Service: Urology;  Laterality: Left;   URETEROSCOPY WITH HOLMIUM LASER LITHOTRIPSY Left 06/28/2015   Procedure: URETEROSCOPY WITH HOLMIUM LASER LITHOTRIPSY;  Surgeon: Rosina Riis, MD;  Location: [facility];  Service: Urology;  Laterality: Left;       Home Medications    Prior to Admission medications   Medication Sig Start Date End Date Taking? Authorizing Provider  cetirizine (ZYRTEC) 10 MG tablet Take 10 mg by mouth daily as needed for allergies.   Yes [provider]  clopidogrel  (PLAVIX ) 75 MG tablet Take 75 mg by mouth daily. 03/27/21  Yes [provider]  gabapentin  (NEURONTIN ) 400 MG capsule Take 400 mg by mouth 3 (three) times daily.   Yes [provider]  insulin  NPH-regular Human (  70-30) 100 UNIT/ML injection Inject 55-60 Units into the skin See admin instructions. Inject 60 units in the morning and 55 units at night   Yes [provider]  ipratropium (ATROVENT ) 0.06 % nasal spray Place 2 sprays into both nostrils 4 (four) times daily. 10/17/23  Yes Jazlene Bares, DO  losartan (COZAAR) 50 MG tablet Take 50 mg by mouth daily.   Yes [provider]  metFORMIN (GLUCOPHAGE-XR) 500 MG 24 hr tablet Take 500-1,000 mg by mouth See admin instructions. Take 1000 mg in the morning and 500 mg in the evening 12/05/15  Yes [provider]  methocarbamol (ROBAXIN) 500 MG tablet Take 500 mg by mouth every 8 (eight) hours as needed for muscle spasms.   Yes [provider]  rosuvastatin  (CRESTOR ) 5 MG tablet Take 5 mg by mouth daily. 10/26/21  Yes [provider]  Spacer/Aero-Holding Chambers (AEROCHAMBER MV) inhaler Use as instructed 05/24/23  Yes Bernardino Ditch, NP  tiZANidine  (ZANAFLEX ) 4 MG capsule Take 4 mg  by mouth 3 (three) times daily.   Yes [provider]  traMADol  (ULTRAM ) 50 MG tablet Take 50 mg by mouth every 6 (six) hours as needed for moderate pain. for pain   Yes [provider]  albuterol  (VENTOLIN  HFA) 108 (90 Base) MCG/ACT inhaler Inhale 2 puffs into the lungs every 4 (four) hours as needed. 10/31/23   Caleigha Zale, DO  azithromycin  (ZITHROMAX  Z-PAK) 250 MG tablet Take 2 tablets on day 1 then 1 tablet daily 10/31/23   Makynna Manocchio, Caprice, DO  predniSONE  (STERAPRED UNI-PAK 21 TAB) 10 MG (21) TBPK tablet Take by mouth daily. Take 6 tabs by mouth daily for 1, then 5 tabs for 1 day, then 4 tabs for 1 day, then 3 tabs for 1 day, then 2 tabs for 1 day, then 1 tab for 1 day. 10/31/23   Albaraa Swingle, DO  promethazine -dextromethorphan  (PROMETHAZINE -DM) 6.25-15 MG/5ML syrup Take 5 mLs by mouth 4 (four) times daily as needed. 10/31/23   Myalynn Lingle, DO    Family History Family History  Problem Relation Age of Onset   Diabetes Mother    Hypertension Mother    Asthma Mother    Heart disease Father    Kidney disease Father    Diabetes Father    Stroke Father     Social History Social History   Tobacco Use   Smoking status: Never   Smokeless tobacco: Never  Vaping Use   Vaping status: Never Used  Substance Use Topics   Alcohol use: Not Currently   Drug use: No     Allergies   Hydromorphone, Aspirin , Nsaids, and Floxin [ofloxacin]   Review of Systems Review of Systems: negative unless otherwise stated in HPI.      Physical Exam Triage Vital Signs ED Triage Vitals  Encounter Vitals Group     BP 10/17/23 1513 (!) 155/86     Systolic BP Percentile --      Diastolic BP Percentile --      Pulse Rate 10/17/23 1513 69     Resp --      Temp 10/17/23 1513 98.5 F (36.9 C)     Temp Source 10/17/23 1513 Oral     SpO2 10/17/23 1513 96 %     Weight 10/17/23 1514 (!) 325 lb (147.4 kg)     Height 10/17/23 1514 6' 2 (1.88 m)     Head Circumference --       Peak Flow --  Pain Score 10/17/23 1513 6     Pain Loc --      Pain Education --      Exclude from Growth Chart --    No data found.  Updated Vital Signs BP (!) 155/86 (BP Location: Left Arm)   Pulse 69   Temp 98.5 F (36.9 C) (Oral)   Ht 6' 2 (1.88 m)   Wt (!) 147.4 kg   SpO2 96%   BMI 41.73 kg/m   Visual Acuity Right Eye Distance:   Left Eye Distance:   Bilateral Distance:    Right Eye Near:   Left Eye Near:    Bilateral Near:     Physical Exam GEN:     alert, non-toxic appearing male in no distress    HENT:  mucus membranes moist, oropharyngeal without lesions or erythema, no tonsillar hypertrophy or exudates, clear nasal discharge EYES:  no scleral injection or discharge, left TM is erythematous and bulging RESP:  no increased work of breathing, clear to auscultation bilaterally CVS:   regular rate and rhythm Skin:   warm and dry, no rash on visible skin    UC Treatments / Results  Labs (all labs ordered are listed, but only abnormal results are displayed) Labs Reviewed  GROUP A STREP BY PCR  RESP PANEL BY RT-PCR (FLU A&B, COVID) [id number]    EKG   Radiology No results found.  Procedures Procedures (including critical care time)  Medications Ordered in UC Medications - No data to display  Initial Impression / Assessment and Plan / UC Course  I have reviewed the triage vital signs and the nursing notes.  Pertinent labs & imaging results that were available during my care of the patient were reviewed by me and considered in my medical decision making (see chart for details).       Pt is a 63 y.o. male who presents for 2-3 days of respiratory symptoms. [place] is afebrile here without recent antipyretics. Satting well on room air. Overall pt is non-toxic appearing, well hydrated, without respiratory distress. Pulmonary exam is unremarkable.  COVID and influenza panel obtained and was negative.  Strep PCR is negative.  Pt to quarantine until COVID  test results or longer if positive.  I will call patient with test results, if positive. History consistent with viral respiratory illness. Discussed symptomatic treatment.  Atrovent  nasal spray for nasal congestion.  Typical duration of symptoms discussed.   On exam, patient has evidence of left otitis media.  Treat with Augmentin  as below.  Return and ED precautions given and voiced understanding. Discussed MDM, treatment plan and plan for follow-up with patient who agrees with plan.     Final Clinical Impressions(s) / UC Diagnoses   Final diagnoses:  Non-recurrent acute suppurative otitis media of right ear without spontaneous rupture of tympanic membrane  URI with cough and congestion     Discharge Instructions      I will call you if your COVID, influenza or strep test is positive.   Take the antibiotics for your ear infection and use the nasal spray for your nasal congestion.    I suspect that your respiratory symptoms are largely due to a virus.  This will gradually improve over the next 7-10 days.      ED Prescriptions     Medication Sig Dispense Auth. Provider   amoxicillin -clavulanate (AUGMENTIN ) 875-125 MG tablet Take 1 tablet by mouth every 12 (twelve) hours. 14 tablet Nicolas Sisler, DO   ipratropium (ATROVENT ) 0.06 %  nasal spray Place 2 sprays into both nostrils 4 (four) times daily. 15 mL Namiah Dunnavant, DO      PDMP not reviewed this encounter.   Kriste Berth, DO 11/02/23 2033

## 2023-10-17 NOTE — ED Triage Notes (Signed)
 Pt c/o nasal congestion, cough, ear pain, sore throat x5days  Pt states that he has been around his wife who is sick with the same symptoms.

## 2023-10-17 NOTE — Discharge Instructions (Addendum)
 I will call you if your COVID, influenza or strep test is positive.   Take the antibiotics for your ear infection and use the nasal spray for your nasal congestion.    I suspect that your respiratory symptoms are largely due to a virus.  This will gradually improve over the next 7-10 days.

## 2023-10-31 ENCOUNTER — Ambulatory Visit
Admission: EM | Admit: 2023-10-31 | Discharge: 2023-10-31 | Disposition: A | Discharge status: Home / Self Care | Attending: Family Medicine | Admitting: Family Medicine

## 2023-10-31 DIAGNOSIS — J329 Chronic sinusitis, unspecified: Secondary | ICD-10-CM | POA: Diagnosis not present

## 2023-10-31 DIAGNOSIS — J40 Bronchitis, not specified as acute or chronic: Secondary | ICD-10-CM | POA: Diagnosis not present

## 2023-10-31 MED ORDER — ALBUTEROL SULFATE HFA 108 (90 BASE) MCG/ACT IN AERS: 2.0000 | INHALATION_SPRAY | RESPIRATORY_TRACT | 0 refills | Status: DC | PRN

## 2023-10-31 MED ORDER — PREDNISONE 10 MG (21) PO TBPK: ORAL_TABLET | Freq: Every day | ORAL | 0 refills | Status: DC

## 2023-10-31 MED ORDER — PROMETHAZINE-DM 6.25-15 MG/5ML PO SYRP: 5.0000 mL | ORAL_SOLUTION | Freq: Four times a day (QID) | ORAL | 0 refills | Status: DC | PRN

## 2023-10-31 MED ORDER — AZITHROMYCIN 250 MG PO TABS: ORAL_TABLET | ORAL | 0 refills | Status: DC

## 2023-10-31 NOTE — ED Triage Notes (Signed)
 Pt st's he was seen here approx 2 weeks ago with same symptoms,  St's after he finished his antibiotics he felt better but now symptoms have returned.  Productive cough (yellow) and pressure in left side of face and ear

## 2023-10-31 NOTE — ED Provider Notes (Signed)
 [facility]    CSN: [id number] Arrival date & time: 10/31/23  1545      History   Chief Complaint Chief Complaint  Patient presents with   Cough    HPI Anthony Fuller is a 63 y.o. male.   HPI  History obtained from the patient. Anthony Fuller presents for productive cough with yellow sputum, left sided facial pain, nasal congestion and left ear pain. Has similar sx about 2 weeks ago. Feels like he has pressure in his head. Had bad headache. Tried Mucinex  which dried him up but he still felt bad. Used the nasal spray which helps somewhat.  Walking around makes him a little short of breathe. No fever but feels like he does.  His wife was sick too.    No history asthma. Denies vaping or smoking.      Past Medical History:  Diagnosis Date   Acid indigestion 02/14/2012   Anxiety    Arthritis    BIL.KNEES   Bulge of lumbar disc without myelopathy 09/05/2011   Cancer (HCC) 2012   R kidney removed   Cervical stenosis of spine    Cervicalgia    Chest wall pain    Cirrhosis of liver (HCC)    Clostridium difficile infection 02/04/2014   DDD (degenerative disc disease), lumbar    DDD (degenerative disc disease), lumbar    Degeneration of intervertebral disc of lumbar region 08/23/2011   Diabetes mellitus (HCC) 07/24/2011   Diabetes mellitus without complication (HCC)    Diabetic polyneuropathy associated with type 2 diabetes mellitus (HCC)    Diarrhea    DM (diabetes mellitus) type 2, uncontrolled, with ketoacidosis (HCC) 05/30/2015   Dyspepsia    Dyspnea    Enthesopathy of hip 04/11/2015   Fatty liver disease, nonalcoholic 09/12/2014   GERD (gastroesophageal reflux disease)    Headache    Hematuria    Hepatic cirrhosis (HCC) 09/12/2014   Hydrocele    Hydrocele    Hypertension    IBS (irritable bowel syndrome)    Lumbar herniated disc    Lumbar radiculopathy 09/05/2011   Lumbar stenosis    NAFLD (nonalcoholic fatty liver disease)    Peroneal tendon injury,  right, initial encounter    Peroneal tendon tear    Radiculopathy    Renal cancer (HCC)    Renal insufficiency    Right Nephrectomy due to RCC.   Sleep apnea    snores   Stenosis of cervical spine    Thoracic and lumbosacral neuritis 07/24/2011   Thrombocytopathia (HCC)    Thrombocytopenia (HCC)     Patient Active Problem List   Diagnosis Date Noted   Respiratory infection 03/28/2023   Acute cough 03/28/2023   Bronchitis 03/28/2023   Right upper quadrant pain 12/12/2021   Type 2 diabetes mellitus with peripheral neuropathy (HCC) 12/12/2021   Acute gastroenteritis 12/11/2021   Gastroesophageal reflux disease without esophagitis 08/05/2016   Diabetic polyneuropathy associated with type 2 diabetes mellitus (HCC) 02/03/2016   Essential hypertension 07/12/2015   Cervical spinal stenosis 06/24/2015   Sepsis (HCC) 06/16/2015   Left sided abdominal pain 05/30/2015   Chest pain 05/30/2015   CKD (chronic kidney disease), 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   Hyponatremia 05/30/2015   Leukocytosis 05/30/2015   DM (diabetes mellitus) type 2, uncontrolled, with ketoacidosis (HCC) 05/30/2015   Solitary kidney 05/30/2015   Acute pyelonephritis 05/27/2015   Pain due to ureteral stent (HCC) 05/27/2015   Enthesopathy of hip 04/11/2015   Hepatic cirrhosis (HCC) 09/12/2014  Fatty liver disease, nonalcoholic 09/12/2014   Cervical pain 08/06/2014   Clostridium difficile infection 02/04/2014   Diarrhea 12/17/2013   Arthralgia of hip 08/26/2013   H/O urinary disorder 06/29/2013   Acid indigestion 02/14/2012   Low back pain 12/18/2011   Bulge of lumbar disc without myelopathy 09/05/2011   Lumbar radiculopathy 09/05/2011   Degeneration of intervertebral disc of lumbar region 08/23/2011   Lumbar canal stenosis 08/23/2011   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   Thoracic and lumbosacral neuritis 07/24/2011   Diabetes mellitus (HCC) 07/24/2011    Past Surgical History:  Procedure  Laterality Date   BACK SURGERY     cervical fusion C5-7   CARDIAC CATHETERIZATION Right 06/20/2015   Procedure: Left Heart Cath and Coronary Angiography;  Surgeon: Denyse DELENA Bathe, MD;  Location: [facility];  Service: Cardiovascular;  Laterality: Right;   COLONOSCOPY     CYSTOSCOPY W/ RETROGRADES Left 05/18/2015   Procedure: CYSTOSCOPY WITH RETROGRADE PYELOGRAM;  Surgeon: Redell Lynwood Napoleon, MD;  Location: [facility];  Service: Urology;  Laterality: Left;   CYSTOSCOPY WITH STENT PLACEMENT Left 05/18/2015   Procedure: CYSTOSCOPY WITH STENT PLACEMENT;  Surgeon: Redell Lynwood Napoleon, MD;  Location: [facility];  Service: Urology;  Laterality: Left;   CYSTOSCOPY WITH STENT PLACEMENT Left 06/28/2015   Procedure: CYSTOSCOPY WITH STENT PLACEMENT/ EXCHANGE;  Surgeon: Rosina Riis, MD;  Location: [facility];  Service: Urology;  Laterality: Left;   ESOPHAGOGASTRODUODENOSCOPY (EGD) WITH PROPOFOL  N/A 09/03/2016   Procedure: ESOPHAGOGASTRODUODENOSCOPY (EGD) WITH PROPOFOL ;  Surgeon: Lamar ONEIDA Holmes, MD;  Location: [facility];  Service: Endoscopy;  Laterality: N/A;   ESOPHAGOGASTRODUODENOSCOPY ENDOSCOPY N/A 03/14/2018   Procedure: ESOPHAGOGASTRODUODENOSCOPY ENDOSCOPY;  Surgeon: Holmes Lamar ONEIDA, MD;  Location: [facility];  Service: Endoscopy;  Laterality: N/A;   HALLUX VALGUS BASE WEDGE Right 08/23/2017   Procedure: HALLUX VALGUS BASE WEDGE;  Surgeon: Ashley Soulier, DPM;  Location: [facility];  Service: Podiatry;  Laterality: Right;   INSERTION OF MESH  02/27/2021   Procedure: INSERTION OF MESH;  Surgeon: Rodolph Romano, MD;  Location: [facility];  Service: General;;   JOINT REPLACEMENT     right knee    kidney removed Right    2012   NAFLD     NEPHRECTOMY Right 04/2011   ORIF TOE FRACTURE Right 08/23/2017   Procedure: OPEN REDUCTION INTERNAL FIXATION (ORIF) METATARSAL (TOE) FRACTURE;  Surgeon: Ashley Soulier, DPM;  Location: [facility];  Service: Podiatry;  Laterality: Right;   OSTEOTOMY      SPINE SURGERY     lumbar 2013   URETEROSCOPY WITH HOLMIUM LASER LITHOTRIPSY Left 05/18/2015   Procedure: URETEROSCOPY WITH HOLMIUM LASER LITHOTRIPSY;  Surgeon: Redell Lynwood Napoleon, MD;  Location: [facility];  Service: Urology;  Laterality: Left;   URETEROSCOPY WITH HOLMIUM LASER LITHOTRIPSY Left 06/28/2015   Procedure: URETEROSCOPY WITH HOLMIUM LASER LITHOTRIPSY;  Surgeon: Rosina Riis, MD;  Location: [facility];  Service: Urology;  Laterality: Left;       Home Medications    Prior to Admission medications   Medication Sig Start Date End Date Taking? Authorizing Provider  azithromycin  (ZITHROMAX  Z-PAK) 250 MG tablet Take 2 tablets on day 1 then 1 tablet daily 10/31/23  Yes Dabney Dever, DO  predniSONE  (STERAPRED UNI-PAK 21 TAB) 10 MG (21) TBPK tablet Take by mouth daily. Take 6 tabs by mouth daily for 1, then 5 tabs for 1 day, then 4 tabs for 1 day, then 3 tabs for 1 day, then 2 tabs for 1 day, then  1 tab for 1 day. 10/31/23  Yes Adelene Polivka, DO  promethazine -dextromethorphan  (PROMETHAZINE -DM) 6.25-15 MG/5ML syrup Take 5 mLs by mouth 4 (four) times daily as needed. 10/31/23  Yes Robie Oats, DO  albuterol  (VENTOLIN  HFA) 108 (90 Base) MCG/ACT inhaler Inhale 2 puffs into the lungs every 4 (four) hours as needed. 10/31/23   Kajol Crispen, DO  cetirizine (ZYRTEC) 10 MG tablet Take 10 mg by mouth daily as needed for allergies.    [provider]  clopidogrel  (PLAVIX ) 75 MG tablet Take 75 mg by mouth daily. 03/27/21   [provider]  gabapentin  (NEURONTIN ) 400 MG capsule Take 400 mg by mouth 3 (three) times daily.    [provider]  insulin  NPH-regular Human (70-30) 100 UNIT/ML injection Inject 55-60 Units into the skin See admin instructions. Inject 60 units in the morning and 55 units at night    [provider]  ipratropium (ATROVENT ) 0.06 % nasal spray Place 2 sprays into both nostrils 4 (four) times daily. 10/17/23   Ornella Coderre, DO  losartan  (COZAAR) 50 MG tablet Take 50 mg by mouth daily.    [provider]  metFORMIN (GLUCOPHAGE-XR) 500 MG 24 hr tablet Take 500-1,000 mg by mouth See admin instructions. Take 1000 mg in the morning and 500 mg in the evening 12/05/15   [provider]  methocarbamol (ROBAXIN) 500 MG tablet Take 500 mg by mouth every 8 (eight) hours as needed for muscle spasms.    [provider]  rosuvastatin  (CRESTOR ) 5 MG tablet Take 5 mg by mouth daily. 10/26/21   [provider]  Spacer/Aero-Holding Chambers (AEROCHAMBER MV) inhaler Use as instructed 05/24/23   Bernardino Ditch, NP  tiZANidine  (ZANAFLEX ) 4 MG capsule Take 4 mg by mouth 3 (three) times daily.    [provider]  traMADol  (ULTRAM ) 50 MG tablet Take 50 mg by mouth every 6 (six) hours as needed for moderate pain. for pain    [provider]    Family History Family History  Problem Relation Age of Onset   Diabetes Mother    Hypertension Mother    Asthma Mother    Heart disease Father    Kidney disease Father    Diabetes Father    Stroke Father     Social History Social History   Tobacco Use   Smoking status: Never   Smokeless tobacco: Never  Vaping Use   Vaping status: Never Used  Substance Use Topics   Alcohol use: Not Currently   Drug use: No     Allergies   Hydromorphone, Aspirin , Nsaids, and Floxin [ofloxacin]   Review of Systems Review of Systems: negative unless otherwise stated in HPI.      Physical Exam Triage Vital Signs ED Triage Vitals [10/31/23 1555]  Encounter Vitals Group     BP      Girls Systolic BP Percentile      Girls Diastolic BP Percentile      Boys Systolic BP Percentile      Boys Diastolic BP Percentile      Pulse      Resp      Temp      Temp src      SpO2      Weight      Height      Head Circumference      Peak Flow      Pain Score 7     Pain Loc      Pain Education  Exclude from Growth Chart    No data found.  Updated Vital  Signs BP (!) 145/76 (BP Location: Right Arm)   Pulse 84   Temp 98.6 F (37 C) (Oral)   Resp 20   SpO2 95%   Visual Acuity Right Eye Distance:   Left Eye Distance:   Bilateral Distance:    Right Eye Near:   Left Eye Near:    Bilateral Near:     Physical Exam GEN:     alert, non-toxic appearing male in no distress    HENT:  mucus membranes moist, oropharyngeal without lesions or erythema, no tonsillar hypertrophy or exudates, clear nasal discharge, right TM normal, left TM erythematous bulging, frontal and maxillary sinus tenderness greater on the left EYES:   pupils equal and reactive, no scleral injection or discharge NECK: Good ROM, no lymphadenopathy, no meningismus   RESP:  no increased work of breathing, coarse breath sounds bilaterally CVS:   regular rate and rhythm Skin:   warm and dry    UC Treatments / Results  Labs (all labs ordered are listed, but only abnormal results are displayed) Labs Reviewed - No data to display  EKG   Radiology No results found.    Procedures Procedures (including critical care time)  Medications Ordered in UC Medications - No data to display  Initial Impression / Assessment and Plan / UC Course  I have reviewed the triage vital signs and the nursing notes.  Pertinent labs & imaging results that were available during my care of the patient were reviewed by me and considered in my medical decision making (see chart for details).       Pt is a 63 y.o. male who presents for 2 weeks of cough and left ear pain that are not improving.  Asaph is afebrile here without recent antipyretics. Satting adequately on room air. Overall pt is  non-toxic appearing, well hydrated, without respiratory distress. Pulmonary exam is remarkable for coarse breath sounds bilaterally with frequent cough.  After shared decision making, we will not pursue chest x-ray.  COVID  and influenza testing were negative at his previous urgent care visit.   Treat  acute bronchitis with steroids and antibiotics as below.  Promethazine  DM cough syrup given for cough and allow patient to rest.  Albuterol  inhaler also prescribed.  Typical duration of symptoms discussed. Return for chest x-ray if not improving and ED precautions given and patient voiced understanding.   Discussed MDM, treatment plan and plan for follow-up with patient who agrees with plan.     Final Clinical Impressions(s) / UC Diagnoses   Final diagnoses:  Sinobronchitis     Discharge Instructions      Stop by the pharmacy to pick up your prescriptions.  Follow up with your primary care provider or return to the urgent care, if not improving.   Be sure monitor your blood sugars and blood pressure while taking steroids/prednisone .  Call your doctor, if your blood sugar is above 400. Be sure to drink plenty of fluids to flush out your extra sugar.      ED Prescriptions     Medication Sig Dispense Auth. Provider   predniSONE  (STERAPRED UNI-PAK 21 TAB) 10 MG (21) TBPK tablet Take by mouth daily. Take 6 tabs by mouth daily for 1, then 5 tabs for 1 day, then 4 tabs for 1 day, then 3 tabs for 1 day, then 2 tabs for 1 day, then 1 tab for 1 day. 21 tablet Darrelle Barrell,  Aneri Slagel, DO   azithromycin  (ZITHROMAX  Z-PAK) 250 MG tablet Take 2 tablets on day 1 then 1 tablet daily 6 tablet Alexsys Eskin, DO   albuterol  (VENTOLIN  HFA) 108 (90 Base) MCG/ACT inhaler Inhale 2 puffs into the lungs every 4 (four) hours as needed. 18 g Montrelle Eddings, DO   promethazine -dextromethorphan  (PROMETHAZINE -DM) 6.25-15 MG/5ML syrup Take 5 mLs by mouth 4 (four) times daily as needed. 118 mL Mariah Harn, DO      I have reviewed the PDMP during this encounter.   Kailena Lubas, DO 11/02/23 2038

## 2023-10-31 NOTE — Discharge Instructions (Addendum)
 Stop by the pharmacy to pick up your prescriptions.  Follow up with your primary care provider or return to the urgent care, if not improving.   Be sure monitor your blood sugars and blood pressure while taking steroids/prednisone .  Call your doctor, if your blood sugar is above 400. Be sure to drink plenty of fluids to flush out your extra sugar.

## 2023-11-06 ENCOUNTER — Ambulatory Visit (INDEPENDENT_AMBULATORY_CARE_PROVIDER_SITE_OTHER)

## 2023-11-06 ENCOUNTER — Ambulatory Visit
Admission: EM | Admit: 2023-11-06 | Discharge: 2023-11-06 | Disposition: A | Discharge status: Home / Self Care | Attending: Family Medicine | Admitting: Family Medicine

## 2023-11-06 DIAGNOSIS — J988 Other specified respiratory disorders: Secondary | ICD-10-CM

## 2023-11-06 DIAGNOSIS — J40 Bronchitis, not specified as acute or chronic: Secondary | ICD-10-CM

## 2023-11-06 DIAGNOSIS — I10 Essential (primary) hypertension: Secondary | ICD-10-CM | POA: Diagnosis not present

## 2023-11-06 MED ORDER — CEFDINIR 300 MG PO CAPS: 300.0000 mg | ORAL_CAPSULE | Freq: Two times a day (BID) | ORAL | 0 refills | Status: DC

## 2023-11-06 MED ORDER — ALBUTEROL SULFATE HFA 108 (90 BASE) MCG/ACT IN AERS: 2.0000 | INHALATION_SPRAY | RESPIRATORY_TRACT | 0 refills | Status: DC | PRN

## 2023-11-06 MED ORDER — PROMETHAZINE-DM 6.25-15 MG/5ML PO SYRP: 5.0000 mL | ORAL_SOLUTION | Freq: Four times a day (QID) | ORAL | 0 refills | Status: DC | PRN

## 2023-11-06 MED ORDER — DOXYCYCLINE HYCLATE 100 MG PO CAPS: 100.0000 mg | ORAL_CAPSULE | Freq: Two times a day (BID) | ORAL | 0 refills | Status: DC

## 2023-11-06 MED ORDER — PREDNISONE 10 MG (21) PO TBPK: ORAL_TABLET | Freq: Every day | ORAL | 0 refills | Status: DC

## 2023-11-06 NOTE — ED Provider Notes (Addendum)
 [facility]    CSN: [id number] Arrival date & time: 11/06/23  1317      History   Chief Complaint Chief Complaint  Patient presents with   Cough   Facial Pain    HPI Anthony Fuller is a 63 y.o. male.   HPI  History obtained from the patient. Anthony Fuller presents for productive cough for 3-4 weeks. Has facial pain and ear pain with nasal congestion that continues to be persistent. Took antibiotics and steroids with some relief but symptoms have not resolved.   No history of asthma. Denies vaping and smoking.      Past Medical History:  Diagnosis Date   Acid indigestion 02/14/2012   Anxiety    Arthritis    BIL.KNEES   Bulge of lumbar disc without myelopathy 09/05/2011   Cancer (HCC) 2012   R kidney removed   Cervical stenosis of spine    Cervicalgia    Chest wall pain    Cirrhosis of liver (HCC)    Clostridium difficile infection 02/04/2014   DDD (degenerative disc disease), lumbar    DDD (degenerative disc disease), lumbar    Degeneration of intervertebral disc of lumbar region 08/23/2011   Diabetes mellitus (HCC) 07/24/2011   Diabetes mellitus without complication (HCC)    Diabetic polyneuropathy associated with type 2 diabetes mellitus (HCC)    Diarrhea    DM (diabetes mellitus) type 2, uncontrolled, with ketoacidosis (HCC) 05/30/2015   Dyspepsia    Dyspnea    Enthesopathy of hip 04/11/2015   Fatty liver disease, nonalcoholic 09/12/2014   GERD (gastroesophageal reflux disease)    Headache    Hematuria    Hepatic cirrhosis (HCC) 09/12/2014   Hydrocele    Hydrocele    Hypertension    IBS (irritable bowel syndrome)    Lumbar herniated disc    Lumbar radiculopathy 09/05/2011   Lumbar stenosis    NAFLD (nonalcoholic fatty liver disease)    Peroneal tendon injury, right, initial encounter    Peroneal tendon tear    Radiculopathy    Renal cancer (HCC)    Renal insufficiency    Right Nephrectomy due to RCC.   Sleep apnea    snores   Stenosis  of cervical spine    Thoracic and lumbosacral neuritis 07/24/2011   Thrombocytopathia (HCC)    Thrombocytopenia [facility])     Patient Active Problem List   Diagnosis Date Noted   Respiratory infection 03/28/2023   Acute cough 03/28/2023   Bronchitis 03/28/2023   Right upper quadrant pain 12/12/2021   Type 2 diabetes mellitus with peripheral neuropathy (HCC) 12/12/2021   Acute gastroenteritis 12/11/2021   Gastroesophageal reflux disease without esophagitis 08/05/2016   Diabetic polyneuropathy associated with type 2 diabetes mellitus (HCC) 02/03/2016   Essential hypertension 07/12/2015   Cervical spinal stenosis 06/24/2015   Sepsis (HCC) 06/16/2015   Left sided abdominal pain 05/30/2015   Chest pain 05/30/2015   CKD (chronic kidney disease), 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   Hyponatremia 05/30/2015   Leukocytosis 05/30/2015   DM (diabetes mellitus) type 2, uncontrolled, with ketoacidosis (HCC) 05/30/2015   Solitary kidney 05/30/2015   Acute pyelonephritis 05/27/2015   Pain due to ureteral stent (HCC) 05/27/2015   Enthesopathy of hip 04/11/2015   Hepatic cirrhosis (HCC) 09/12/2014   Fatty liver disease, nonalcoholic 09/12/2014   Cervical pain 08/06/2014   Clostridium difficile infection 02/04/2014   Diarrhea 12/17/2013   Arthralgia of hip 08/26/2013   H/O urinary disorder 06/29/2013   Acid indigestion 02/14/2012  Low back pain 12/18/2011   Bulge of lumbar disc without myelopathy 09/05/2011   Lumbar radiculopathy 09/05/2011   Degeneration of intervertebral disc of lumbar region 08/23/2011   Lumbar canal stenosis 08/23/2011   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   Thoracic and lumbosacral neuritis 07/24/2011   Diabetes mellitus (HCC) 07/24/2011    Past Surgical History:  Procedure Laterality Date   BACK SURGERY     cervical fusion C5-7   CARDIAC CATHETERIZATION Right 06/20/2015   Procedure: Left Heart Cath and Coronary Angiography;  Surgeon: Denyse DELENA Bathe, MD;   Location: [facility] INVASIVE CV [facility];  Service: Cardiovascular;  Laterality: Right;   COLONOSCOPY     CYSTOSCOPY W/ RETROGRADES Left 05/18/2015   Procedure: CYSTOSCOPY WITH RETROGRADE PYELOGRAM;  Surgeon: Redell Lynwood Napoleon, MD;  Location: [facility] ORS;  Service: Urology;  Laterality: Left;   CYSTOSCOPY WITH STENT PLACEMENT Left 05/18/2015   Procedure: CYSTOSCOPY WITH STENT PLACEMENT;  Surgeon: Redell Lynwood Napoleon, MD;  Location: [facility] ORS;  Service: Urology;  Laterality: Left;   CYSTOSCOPY WITH STENT PLACEMENT Left 06/28/2015   Procedure: CYSTOSCOPY WITH STENT PLACEMENT/ EXCHANGE;  Surgeon: Rosina Riis, MD;  Location: [facility] ORS;  Service: Urology;  Laterality: Left;   ESOPHAGOGASTRODUODENOSCOPY (EGD) WITH PROPOFOL  N/A 09/03/2016   Procedure: ESOPHAGOGASTRODUODENOSCOPY (EGD) WITH PROPOFOL ;  Surgeon: Lamar ONEIDA Holmes, MD;  [facility] ENDOSCOPY;  Service: Endoscopy;  Laterality: N/A;   ESOPHAGOGASTRODUODENOSCOPY ENDOSCOPY N/A 03/14/2018   Procedure: ESOPHAGOGASTRODUODENOSCOPY ENDOSCOPY;  Surgeon: Holmes Lamar ONEIDA, MD;  Location: [facility] ENDOSCOPY;  Service: Endoscopy;  Laterality: N/A;   HALLUX VALGUS BASE WEDGE Right 08/23/2017   Procedure: HALLUX VALGUS BASE WEDGE;  Surgeon: Ashley Soulier, DPM;  Location: [facility] ORS;  Service: Podiatry;  Laterality: Right;   INSERTION OF MESH  02/27/2021   Procedure: INSERTION OF MESH;  Surgeon: Rodolph Romano, MD;  Location: [facility] ORS;  Service: General;;   JOINT REPLACEMENT     right knee    kidney removed Right    2012   NAFLD     NEPHRECTOMY Right 04/2011   ORIF TOE FRACTURE Right 08/23/2017   Procedure: OPEN REDUCTION INTERNAL FIXATION (ORIF) METATARSAL (TOE) FRACTURE;  Surgeon: Ashley Soulier, DPM;  Location: [facility] ORS;  Service: Podiatry;  Laterality: Right;   OSTEOTOMY     SPINE SURGERY     lumbar 2013   URETEROSCOPY WITH HOLMIUM LASER LITHOTRIPSY Left 05/18/2015   Procedure: URETEROSCOPY WITH HOLMIUM LASER LITHOTRIPSY;  Surgeon: Redell Lynwood Napoleon,  MD;  Location: [facility] ORS;  Service: Urology;  Laterality: Left;   URETEROSCOPY WITH HOLMIUM LASER LITHOTRIPSY Left 06/28/2015   Procedure: URETEROSCOPY WITH HOLMIUM LASER LITHOTRIPSY;  Surgeon: Rosina Riis, MD;  Location: [facility] ORS;  Service: Urology;  Laterality: Left;       Home Medications    Prior to Admission medications   Medication Sig Start Date End Date Taking? Authorizing Provider  cefdinir  (OMNICEF ) 300 MG capsule Take 1 capsule (300 mg total) by mouth 2 (two) times daily. 11/06/23  Yes Devyn Sheerin, DO  cetirizine (ZYRTEC) 10 MG tablet Take 10 mg by mouth daily as needed for allergies.   Yes [provider]  doxycycline  (VIBRAMYCIN ) 100 MG capsule Take 1 capsule (100 mg total) by mouth 2 (two) times daily. 11/06/23  Yes Almus Woodham, DO  gabapentin  (NEURONTIN ) 400 MG capsule Take 400 mg by mouth 3 (three) times daily.   Yes [provider]  insulin  NPH-regular Human (70-30) 100 UNIT/ML injection Inject 55-60 Units into the skin See admin instructions. Inject 60  units in the morning and 55 units at night   Yes [provider]  ipratropium (ATROVENT ) 0.06 % nasal spray Place 2 sprays into both nostrils 4 (four) times daily. 10/17/23  Yes Ryelynn Guedea, DO  losartan (COZAAR) 50 MG tablet Take 50 mg by mouth daily.   Yes [provider]  metFORMIN (GLUCOPHAGE-XR) 500 MG 24 hr tablet Take 500-1,000 mg by mouth See admin instructions. Take 1000 mg in the morning and 500 mg in the evening 12/05/15  Yes [provider]  predniSONE  (STERAPRED UNI-PAK 21 TAB) 10 MG (21) TBPK tablet Take by mouth daily. Take 6 tabs by mouth daily for 1, then 5 tabs for 1 day, then 4 tabs for 1 day, then 3 tabs for 1 day, then 2 tabs for 1 day, then 1 tab for 1 day. 11/06/23  Yes Domnique Vanegas, DO  promethazine -dextromethorphan  (PROMETHAZINE -DM) 6.25-15 MG/5ML syrup Take 5 mLs by mouth 4 (four) times daily as needed. 11/06/23  Yes Ava Deguire, DO  tiZANidine   (ZANAFLEX ) 4 MG capsule Take 4 mg by mouth 3 (three) times daily.   Yes [provider]  traMADol  (ULTRAM ) 50 MG tablet Take 50 mg by mouth every 6 (six) hours as needed for moderate pain. for pain   Yes [provider]  albuterol  (VENTOLIN  HFA) 108 (90 Base) MCG/ACT inhaler Inhale 2 puffs into the lungs every 4 (four) hours as needed. 11/06/23   Lynde Ludwig, DO  clopidogrel  (PLAVIX ) 75 MG tablet Take 75 mg by mouth daily. 03/27/21   [provider]  methocarbamol (ROBAXIN) 500 MG tablet Take 500 mg by mouth every 8 (eight) hours as needed for muscle spasms.    [provider]  rosuvastatin  (CRESTOR ) 5 MG tablet Take 5 mg by mouth daily. 10/26/21   [provider]  Spacer/Aero-Holding Raguel (AEROCHAMBER MV) inhaler Use as instructed 05/24/23   Bernardino Ditch, NP    Family History Family History  Problem Relation Age of Onset   Diabetes Mother    Hypertension Mother    Asthma Mother    Heart disease Father    Kidney disease Father    Diabetes Father    Stroke Father     Social History Social History   Tobacco Use   Smoking status: Never   Smokeless tobacco: Never  Vaping Use   Vaping status: Never Used  Substance Use Topics   Alcohol use: Not Currently   Drug use: No     Allergies   Hydromorphone, Aspirin , Nsaids, and Floxin [ofloxacin]   Review of Systems Review of Systems: negative unless otherwise stated in HPI.      Physical Exam Triage Vital Signs ED Triage Vitals  Encounter Vitals Group     BP 11/06/23 1349 (!) 167/96     Girls Systolic BP Percentile --      Girls Diastolic BP Percentile --      Boys Systolic BP Percentile --      Boys Diastolic BP Percentile --      Pulse Rate 11/06/23 1349 89     Resp 11/06/23 1349 18     Temp 11/06/23 1349 98.9 F (37.2 C)     Temp Source 11/06/23 1349 Oral     SpO2 11/06/23 1349 90 %     Weight --      Height --      Head Circumference --      Peak Flow --      Pain  Score 11/06/23 1350 7  Pain Loc --      Pain Education --      Exclude from Growth Chart --    No data found.  Updated Vital Signs BP (!) 167/96 (BP Location: Left Arm)   Pulse 89   Temp 98.9 F (37.2 C) (Oral)   Resp 18   SpO2 93%   Visual Acuity Right Eye Distance:   Left Eye Distance:   Bilateral Distance:    Right Eye Near:   Left Eye Near:    Bilateral Near:     Physical Exam GEN:     alert, non-toxic appearing male in no distress    HENT:  mucus membranes moist,  moderate erythematous edematous turbinates, thick nasal discharge, bilateral TM normal EYES:   pupils equal and reactive, no scleral injection or discharge  RESP:  no increased work of breathing, coarse breathe sounds bilaterally CVS:   regular rate and rhythm Skin:   warm and dry    UC Treatments / Results  Labs (all labs ordered are listed, but only abnormal results are displayed) Labs Reviewed - No data to display  EKG   Radiology DG Chest 2 View Result Date: 11/06/2023 CLINICAL DATA:  cough for 3-4 weeks EXAM: CHEST - 2 VIEW COMPARISON:  05/24/2023 FINDINGS: Lungs are clear. Heart size and mediastinal contours are within normal limits. No pleural effusion. Vertebral endplate spurring at multiple levels in the lower thoracic spine. Cervical fixation hardware. IMPRESSION: No acute cardiopulmonary disease. Electronically Signed   By: JONETTA Faes M.D.   On: 11/06/2023 14:37     Procedures Procedures (including critical care time)  Medications Ordered in UC Medications - No data to display  Initial Impression / Assessment and Plan / UC Course  I have reviewed the triage vital signs and the nursing notes.  Pertinent labs & imaging results that were available during my care of the patient were reviewed by me and considered in my medical decision making (see chart for details).       Pt is a 63 y.o. male who presents for 3-4  weeks of cough and nasal congestion that is not improving.  Damarri  is afebrile here without recent antipyretics. Satting 93% on room air. Overall pt is  non-toxic appearing, well hydrated, without respiratory distress. Pulmonary exam is remarkable for coarse breathe sounds with cough.  After shared decision making, we will pursue chest x-ray.  COVID, RSV  and influenza testing deferred due to length of symptoms.   Chest xray personally reviewed by me without focal pneumonia, pleural effusion, cardiomegaly or pneumothorax. Radiologist impression reviewed.    Treat acute bronchitis with steroids and antibiotics as below. Promethazine  DM cough syrup given for cough and allow patient to rest. Albuterol  inhaler prescribed.  Typical duration of symptoms discussed. Follow up with PCP as he may need to see a specialist if not improving.  ED precautions given and patient voiced understanding.    Chronic hypertension: Elevated BP here at 167/96. Denies needing refills. Advised to monitor BP while taking steroids.  Red flag symptoms discussed.  Discussed MDM, treatment plan and plan for follow-up with patient who agrees with plan.      Final Clinical Impressions(s) / UC Diagnoses   Final diagnoses:  Respiratory infection  Bronchitis  Elevated blood pressure reading with diagnosis of hypertension     Discharge Instructions      Your chest xray did not show evidence of pneumonia.  If symptoms do not improve you may need to see  a lung or ear, nose and throat specialist. Follow up with your primary care doctor, if not improving.       ED Prescriptions     Medication Sig Dispense Auth. Provider   predniSONE  (STERAPRED UNI-PAK 21 TAB) 10 MG (21) TBPK tablet Take by mouth daily. Take 6 tabs by mouth daily for 1, then 5 tabs for 1 day, then 4 tabs for 1 day, then 3 tabs for 1 day, then 2 tabs for 1 day, then 1 tab for 1 day. 21 tablet Reighlyn Elmes, DO   cefdinir  (OMNICEF ) 300 MG capsule Take 1 capsule (300 mg total) by mouth 2 (two) times daily. 14 capsule  Iriana Artley, DO   doxycycline  (VIBRAMYCIN ) 100 MG capsule Take 1 capsule (100 mg total) by mouth 2 (two) times daily. 14 capsule Bearett Porcaro, DO   albuterol  (VENTOLIN  HFA) 108 (90 Base) MCG/ACT inhaler Inhale 2 puffs into the lungs every 4 (four) hours as needed. 18 g Geniece Akers, DO   promethazine -dextromethorphan  (PROMETHAZINE -DM) 6.25-15 MG/5ML syrup Take 5 mLs by mouth 4 (four) times daily as needed. 118 mL Jalilah Wiltsie, DO      I have reviewed the PDMP during this encounter.       Patsy Varma, DO 11/08/23 1916

## 2023-11-06 NOTE — Discharge Instructions (Addendum)
 Your chest xray did not show evidence of pneumonia.  If symptoms do not improve you may need to see a lung or ear, nose and throat specialist. Follow up with your primary care doctor, if not improving.

## 2023-11-06 NOTE — ED Triage Notes (Signed)
 Pt c/o of sinus pain and pressure, cough, congestion, ear pain and fullness x 3-4 weeks. Pt was seen on 6/5 and 6/19 for same symptoms and took prescribed mediations but still having symptoms.

## 2023-12-16 ENCOUNTER — Encounter: Payer: Self-pay | Admitting: Urology

## 2023-12-23 ENCOUNTER — Ambulatory Visit: Admitting: Urology

## 2023-12-25 ENCOUNTER — Ambulatory Visit
Admission: EM | Admit: 2023-12-25 | Discharge: 2023-12-25 | Disposition: A | Discharge status: Home / Self Care | Attending: Family Medicine | Admitting: Family Medicine

## 2023-12-25 DIAGNOSIS — J069 Acute upper respiratory infection, unspecified: Secondary | ICD-10-CM | POA: Diagnosis present

## 2023-12-25 LAB — SARS CORONAVIRUS 2 BY RT PCR: SARS Coronavirus 2 by RT PCR: NEGATIVE

## 2023-12-25 LAB — GROUP A STREP BY PCR: Group A Strep by PCR: NOT DETECTED

## 2023-12-25 MED ORDER — IPRATROPIUM BROMIDE 0.06 % NA SOLN: 2.0000 | Freq: Four times a day (QID) | NASAL | 0 refills | Status: AC

## 2023-12-25 MED ORDER — PROMETHAZINE-DM 6.25-15 MG/5ML PO SYRP: 5.0000 mL | ORAL_SOLUTION | Freq: Four times a day (QID) | ORAL | 0 refills | Status: DC | PRN

## 2023-12-25 NOTE — ED Triage Notes (Signed)
 Pt c/o cough,sore throat,bodyaches & congestion x3 days. Has tried mucinex  w/o relief.

## 2023-12-25 NOTE — Discharge Instructions (Addendum)
 Anthony Fuller, your strep and COVID tests are negative. You have a viral respiratory infection that will gradually improve over the next 7-10 days. Cough may last up to 3 weeks.    You can take Tylenol  and/or Ibuprofen as needed for fever reduction and pain relief.    For cough: honey 1/2 to 1 teaspoon (you can dilute the honey in water or another fluid).  Stop at the pharmacy to pick up your prescription cough medication.  You can use a humidifier for chest congestion and cough.  If you don't have a humidifier, you can sit in the bathroom with the hot shower running.      For sore throat: try warm salt water gargles, Mucinex  sore throat cough drops or cepacol lozenges, throat spray, warm tea or water with lemon/honey, popsicles or ice, or OTC cold relief medicine for throat discomfort. You can also purchase chloraseptic spray at the pharmacy or dollar store.   For congestion: take a daily anti-histamine like Zyrtec, Claritin , and a oral decongestant, such as pseudoephedrine.  You can also use Flonase 1-2 sprays in each nostril daily. Afrin is also a good option, if you do not have high blood pressure.    It is important to stay hydrated: drink plenty of fluids (water, gatorade/powerade/pedialyte, juices, or teas) to keep your throat moisturized and help further relieve irritation/discomfort.    Return or go to the Emergency Department if symptoms worsen or do not improve in the next few days

## 2023-12-25 NOTE — ED Provider Notes (Signed)
 [facility]    CSN: [id number] Arrival date & time: 12/25/23  1804      History   Chief Complaint Chief Complaint  Patient presents with   Cough   Sore Throat    HPI Anthony Fuller is a 63 y.o. male.   HPI  History obtained from the patient. Anthony Fuller presents for cough, sore throat, body aches, fatigue  and nasal congestion for the past 3 days. Has been softer stools but no watery diarrhea. No vomiting.     His grand-daughter has been sick with similar sx.    No history history of smoking or vaping. Denies asthma or COPD.     Past Medical History:  Diagnosis Date   Acid indigestion 02/14/2012   Anxiety    Arthritis    BIL.KNEES   Bulge of lumbar disc without myelopathy 09/05/2011   Cancer (HCC) 2012   R kidney removed   Cervical stenosis of spine    Cervicalgia    Chest wall pain    Cirrhosis of liver (HCC)    Clostridium difficile infection 02/04/2014   DDD (degenerative disc disease), lumbar    DDD (degenerative disc disease), lumbar    Degeneration of intervertebral disc of lumbar region 08/23/2011   Diabetes mellitus (HCC) 07/24/2011   Diabetes mellitus without complication (HCC)    Diabetic polyneuropathy associated with type 2 diabetes mellitus (HCC)    Diarrhea    DM (diabetes mellitus) type 2, uncontrolled, with ketoacidosis (HCC) 05/30/2015   Dyspepsia    Dyspnea    Enthesopathy of hip 04/11/2015   Fatty liver disease, nonalcoholic 09/12/2014   GERD (gastroesophageal reflux disease)    Headache    Hematuria    Hepatic cirrhosis (HCC) 09/12/2014   Hydrocele    Hydrocele    Hypertension    IBS (irritable bowel syndrome)    Lumbar herniated disc    Lumbar radiculopathy 09/05/2011   Lumbar stenosis    NAFLD (nonalcoholic fatty liver disease)    Peroneal tendon injury, right, initial encounter    Peroneal tendon tear    Radiculopathy    Renal cancer (HCC)    Renal insufficiency    Right Nephrectomy due to RCC.   Sleep apnea     snores   Stenosis of cervical spine    Thoracic and lumbosacral neuritis 07/24/2011   Thrombocytopathia (HCC)    Thrombocytopenia (HCC)     Patient Active Problem List   Diagnosis Date Noted   Respiratory infection 03/28/2023   Acute cough 03/28/2023   Bronchitis 03/28/2023   Osteoarthritis of left hip 08/21/2022   Osteoarthritis of left hip joint due to dysplasia 08/21/2022   Right upper quadrant pain 12/12/2021   Type 2 diabetes mellitus with peripheral neuropathy (HCC) 12/12/2021   Acute gastroenteritis 12/11/2021   Obstructive sleep apnea 11/24/2019   SOB (shortness of breath) on exertion 11/05/2019   Daytime somnolence 10/22/2019   Left flank pain 05/28/2019   Hematuria, gross 04/03/2019   Type 2 diabetes mellitus with hyperglycemia, without long-term current use of insulin  (HCC) 07/01/2018   Headache disorder 07/18/2017   Status post total left knee replacement 05/23/2017   Osteoarthritis of left knee 05/22/2017   Other irritable bowel syndrome 05/20/2017   Closed nondisplaced fracture of fifth metatarsal bone of right foot 08/16/2016   Complex regional pain syndrome type 1 of right lower extremity 08/16/2016   Gastroesophageal reflux disease without esophagitis 08/05/2016   Cellulitis of foot, right 08/05/2016   Finding of above normal  blood pressure 08/05/2016   Hypertriglyceridemia 07/19/2016   Nephrolithiasis 07/19/2016   Primary osteoarthritis of knees, bilateral 07/09/2016   Peroneal tendon tear, right, initial encounter 03/08/2016   Diabetic polyneuropathy associated with type 2 diabetes mellitus (HCC) 02/03/2016   Status post cervical spinal fusion 07/14/2015   Essential hypertension 07/12/2015   Hydrocele 07/12/2015   Obesity, unspecified 07/12/2015   Thrombocytopenia (HCC) 07/12/2015   Cervical spinal stenosis 06/24/2015   Sepsis (HCC) 06/16/2015   Left sided abdominal pain 05/30/2015   Left-sided chest pain 05/30/2015   CKD (chronic kidney disease),  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   Hyponatremia 05/30/2015   Leukocytosis 05/30/2015   DM (diabetes mellitus) type 2, uncontrolled, with ketoacidosis (HCC) 05/30/2015   Solitary kidney 05/30/2015   Acute pyelonephritis 05/27/2015   Pain due to ureteral stent (HCC) 05/27/2015   Enthesopathy of hip 04/11/2015   Hepatic cirrhosis (HCC) 09/12/2014   Fatty liver disease, nonalcoholic 09/12/2014   Cervical pain 08/06/2014   Clostridium difficile infection 02/04/2014   Diarrhea 12/17/2013   Pain in joint involving ankle and foot 08/26/2013   H/O urinary disorder 06/29/2013   Acid indigestion 02/14/2012   Low back pain 12/18/2011   Bulge of lumbar disc without myelopathy 09/05/2011   Lumbar radiculopathy 09/05/2011   Degeneration of intervertebral disc of lumbar region 08/23/2011   Lumbar canal stenosis 08/23/2011   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   Thoracic and lumbosacral neuritis 07/24/2011   Diabetes mellitus (HCC) 07/24/2011    Past Surgical History:  Procedure Laterality Date   BACK SURGERY     cervical fusion C5-7   CARDIAC CATHETERIZATION Right 06/20/2015   Procedure: Left Heart Cath and Coronary Angiography;  Surgeon: Denyse DELENA Bathe, MD;  Location: [facility];  Service: Cardiovascular;  Laterality: Right;   COLONOSCOPY     CYSTOSCOPY W/ RETROGRADES Left 05/18/2015   Procedure: CYSTOSCOPY WITH RETROGRADE PYELOGRAM;  Surgeon: Redell Lynwood Napoleon, MD;  Location: [facility];  Service: Urology;  Laterality: Left;   CYSTOSCOPY WITH STENT PLACEMENT Left 05/18/2015   Procedure: CYSTOSCOPY WITH STENT PLACEMENT;  Surgeon: Redell Lynwood Napoleon, MD;  Location: [facility];  Service: Urology;  Laterality: Left;   CYSTOSCOPY WITH STENT PLACEMENT Left 06/28/2015   Procedure: CYSTOSCOPY WITH STENT PLACEMENT/ EXCHANGE;  Surgeon: Rosina Riis, MD;  Location: [facility];  Service: Urology;  Laterality: Left;   ESOPHAGOGASTRODUODENOSCOPY (EGD) WITH PROPOFOL  N/A 09/03/2016   Procedure:  ESOPHAGOGASTRODUODENOSCOPY (EGD) WITH PROPOFOL ;  Surgeon: Lamar ONEIDA Holmes, MD;  Location: [facility];  Service: Endoscopy;  Laterality: N/A;   ESOPHAGOGASTRODUODENOSCOPY ENDOSCOPY N/A 03/14/2018   Procedure: ESOPHAGOGASTRODUODENOSCOPY ENDOSCOPY;  Surgeon: Holmes Lamar ONEIDA, MD;  Location: [facility] ENDOSCOPY;  Service: Endoscopy;  Laterality: N/A;   HALLUX VALGUS BASE WEDGE Right 08/23/2017   Procedure: HALLUX VALGUS BASE WEDGE;  Surgeon: Ashley Soulier, DPM;  Location: [facility];  Service: Podiatry;  Laterality: Right;   INSERTION OF MESH  02/27/2021   Procedure: INSERTION OF MESH;  Surgeon: Rodolph Romano, MD;  Location: [facility];  Service: General;;   JOINT REPLACEMENT     right knee    kidney removed Right    2012   NAFLD     NEPHRECTOMY Right 04/2011   ORIF TOE FRACTURE Right 08/23/2017   Procedure: OPEN REDUCTION INTERNAL FIXATION (ORIF) METATARSAL (TOE) FRACTURE;  Surgeon: Ashley Soulier, DPM;  Location: [facility];  Service: Podiatry;  Laterality: Right;   OSTEOTOMY     SPINE SURGERY     lumbar 2013   URETEROSCOPY WITH HOLMIUM  LASER LITHOTRIPSY Left 05/18/2015   Procedure: URETEROSCOPY WITH HOLMIUM LASER LITHOTRIPSY;  Surgeon: Redell Lynwood Napoleon, MD;  Location: [facility];  Service: Urology;  Laterality: Left;   URETEROSCOPY WITH HOLMIUM LASER LITHOTRIPSY Left 06/28/2015   Procedure: URETEROSCOPY WITH HOLMIUM LASER LITHOTRIPSY;  Surgeon: Rosina Riis, MD;  Location: [facility];  Service: Urology;  Laterality: Left;       Home Medications    Prior to Admission medications   Medication Sig Start Date End Date Taking? Authorizing Provider  diclofenac  Sodium (VOLTAREN ) 1 % GEL Apply 2 g topically 4 (four) times daily. 11/28/23 11/27/24 Yes [provider]  omeprazole (PRILOSEC) 40 MG capsule TAKE 1 CAPSULE BY MOUTH EVERY DAY 12/02/23  Yes [provider]  albuterol  (VENTOLIN  HFA) 108 (90 Base) MCG/ACT inhaler Inhale 2 puffs into the lungs every 4 (four) hours as  needed. 11/06/23   Elisse Pennick, DO  cetirizine (ZYRTEC) 10 MG tablet Take 10 mg by mouth daily as needed for allergies.    [provider]  clopidogrel  (PLAVIX ) 75 MG tablet Take 75 mg by mouth daily. 03/27/21   [provider]  gabapentin  (NEURONTIN ) 400 MG capsule Take 400 mg by mouth 3 (three) times daily.    [provider]  insulin  NPH-regular Human (70-30) 100 UNIT/ML injection Inject 55-60 Units into the skin See admin instructions. Inject 60 units in the morning and 55 units at night    [provider]  ipratropium (ATROVENT ) 0.06 % nasal spray Place 2 sprays into both nostrils 4 (four) times daily. 12/25/23   Chelsei Mcchesney, DO  losartan (COZAAR) 50 MG tablet Take 50 mg by mouth daily.    [provider]  metFORMIN (GLUCOPHAGE-XR) 500 MG 24 hr tablet Take 500-1,000 mg by mouth See admin instructions. Take 1000 mg in the morning and 500 mg in the evening 12/05/15   [provider]  methocarbamol (ROBAXIN) 500 MG tablet Take 500 mg by mouth every 8 (eight) hours as needed for muscle spasms.    [provider]  NOVOLOG  MIX 70/30 FLEXPEN (70-30) 100 UNIT/ML FlexPen INJECT 80 UNITS EVERY MORNING AND 75 UNITS EVERY EVENING.    [provider]  promethazine -dextromethorphan  (PROMETHAZINE -DM) 6.25-15 MG/5ML syrup Take 5 mLs by mouth 4 (four) times daily as needed. 12/25/23   Mercedies Ganesh, DO  rosuvastatin  (CRESTOR ) 5 MG tablet Take 5 mg by mouth daily. 10/26/21   [provider]  Spacer/Aero-Holding Chambers (AEROCHAMBER MV) inhaler Use as instructed 05/24/23   Bernardino Ditch, NP  tiZANidine  (ZANAFLEX ) 4 MG capsule Take 4 mg by mouth 3 (three) times daily.    [provider]  traMADol  (ULTRAM ) 50 MG tablet Take 50 mg by mouth every 6 (six) hours as needed for moderate pain. for pain    [provider]    Family History Family History  Problem Relation Age of Onset   Diabetes Mother    Hypertension  Mother    Asthma Mother    Heart disease Father    Kidney disease Father    Diabetes Father    Stroke Father     Social History Social History   Tobacco Use   Smoking status: Never   Smokeless tobacco: Never  Vaping Use   Vaping status: Never Used  Substance Use Topics   Alcohol use: Not Currently   Drug use: No     Allergies   Hydromorphone, Aspirin , Nsaids, and Floxin [ofloxacin]   Review of Systems Review of Systems: negative unless otherwise  stated in HPI.      Physical Exam Triage Vital Signs ED Triage Vitals  Encounter Vitals Group     BP 12/25/23 1835 127/74     Girls Systolic BP Percentile --      Girls Diastolic BP Percentile --      Boys Systolic BP Percentile --      Boys Diastolic BP Percentile --      Pulse Rate 12/25/23 1835 73     Resp 12/25/23 1835 16     Temp 12/25/23 1835 99 F (37.2 C)     Temp Source 12/25/23 1835 Oral     SpO2 12/25/23 1835 94 %     Weight 12/25/23 1834 (!) 318 lb 8 oz (144.5 kg)     Height --      Head Circumference --      Peak Flow --      Pain Score 12/25/23 1838 5     Pain Loc --      Pain Education --      Exclude from Growth Chart --    No data found.  Updated Vital Signs BP 127/74 (BP Location: Right Arm)   Pulse 73   Temp 99 F (37.2 C) (Oral)   Resp 16   Wt (!) 144.5 kg   SpO2 94%   BMI 40.89 kg/m   Visual Acuity Right Eye Distance:   Left Eye Distance:   Bilateral Distance:    Right Eye Near:   Left Eye Near:    Bilateral Near:     Physical Exam GEN:     alert, ill but non-toxic appearing male in no distress    HENT:  mucus membranes moist, oropharyngeal without lesions, + petechia and erythema, 3+ tonsillar hypertrophy or exudates, clear nasal discharge EYES:   no scleral injection or discharge RESP:  no increased work of breathing, clear to auscultation bilaterally CVS:   regular rate and rhythm Skin:   warm and dry, no rash on visible skin    UC Treatments / Results  Labs (all  labs ordered are listed, but only abnormal results are displayed) Labs Reviewed  SARS CORONAVIRUS 2 BY RT PCR  GROUP A STREP BY PCR    EKG   Radiology No results found.  Procedures Procedures (including critical care time)  Medications Ordered in UC Medications - No data to display  Initial Impression / Assessment and Plan / UC Course  I have reviewed the triage vital signs and the nursing notes.  Pertinent labs & imaging results that were available during my care of the patient were reviewed by me and considered in my medical decision making (see chart for details).       Pt is a 63 y.o. male who presents for 3 days of respiratory symptoms. Deagan is afebrile here without recent antipyretics. Satting well on room air. Overall pt is non-toxic appearing, well hydrated, without respiratory distress. Pulmonary exam is unremarkable.  COVID and was negative. Strep PCR is negative.   History most consistent with viral respiratory illness. Discussed symptomatic treatment. Promethazine  DM and Atrovent  nasal spray prescribed.  Explained lack of efficacy of antibiotics in viral disease.  Typical duration of symptoms discussed.   Return and ED precautions given and voiced understanding. Discussed MDM, treatment plan and plan for follow-up with patient who agrees with plan.     Final Clinical Impressions(s) / UC Diagnoses   Final diagnoses:  Viral URI with cough     Discharge Instructions  [name], your strep and COVID tests are negative. You have a viral respiratory infection that will gradually improve over the next 7-10 days. Cough may last up to 3 weeks.    You can take Tylenol  and/or Ibuprofen as needed for fever reduction and pain relief.    For cough: honey 1/2 to 1 teaspoon (you can dilute the honey in water or another fluid).  Stop at the pharmacy to pick up your prescription cough medication.  You can use a humidifier for chest congestion and cough.  If you  don't have a humidifier, you can sit in the bathroom with the hot shower running.      For sore throat: try warm salt water gargles, Mucinex  sore throat cough drops or cepacol lozenges, throat spray, warm tea or water with lemon/honey, popsicles or ice, or OTC cold relief medicine for throat discomfort. You can also purchase chloraseptic spray at the pharmacy or dollar store.   For congestion: take a daily anti-histamine like Zyrtec, Claritin , and a oral decongestant, such as pseudoephedrine.  You can also use Flonase 1-2 sprays in each nostril daily. Afrin is also a good option, if you do not have high blood pressure.    It is important to stay hydrated: drink plenty of fluids (water, gatorade/powerade/pedialyte, juices, or teas) to keep your throat moisturized and help further relieve irritation/discomfort.    Return or go to the Emergency Department if symptoms worsen or do not improve in the next few days      ED Prescriptions     Medication Sig Dispense Auth. Provider   promethazine -dextromethorphan  (PROMETHAZINE -DM) 6.25-15 MG/5ML syrup Take 5 mLs by mouth 4 (four) times daily as needed. 118 mL Kemon Devincenzi, DO   ipratropium (ATROVENT ) 0.06 % nasal spray Place 2 sprays into both nostrils 4 (four) times daily. 15 mL Mickaela Starlin, DO      PDMP not reviewed this encounter.   Akilah Cureton, DO 12/25/23 1951

## 2024-01-27 ENCOUNTER — Ambulatory Visit: Admitting: Urology

## 2024-01-29 NOTE — Progress Notes (Signed)
 Chief Complaint  Patient presents with  . Annual Exam    Subjective  Anthony Fuller is a 63 y.o. male who presents for Annual Exam HPI History of Present Illness Anthony Fuller is a 63 year old male who presents for an annual physical exam.  He is concerned about potential changes in insurance coverage related to [employer] and [facility], expressing worry about the need to switch providers if an agreement is not reached by October 20th.  He has a history of type 2 diabetes, with his last hemoglobin A1c recorded on September 5th as 7.4, showing improvement from a previous level of 7.7. He continues his current management plan. Lab Results  Component Value Date   HGBA1C 7.4 (H) 01/17/2024   HGBA1C 7.7 (H) 09/13/2023   HGBA1C 7.5 (H) 04/19/2023   Lab Results  Component Value Date   MICROALBUR 137.1 03/07/2022   LDLCALC 50 04/19/2023   CREATININE 1.2 04/19/2023    He recalls a past positive suicide screen but is unsure when this occurred.  Currently not having any suicidal ideations.  He mentions family matters, noting that his family member June recently had large kidney stones removed surgically. His brother Rutha is managing independently despite significant physical challenges.  He has a history of hemorrhoids but states they have not been problematic recently. He recalls a colonoscopy was scheduled for 2020 but was canceled due to a family member's illness.  Review of Systems  Patient Active Problem List  Diagnosis  . DDD (degenerative disc disease), lumbar  . Lumbar stenosis  . Single kidney  . Radiculopathy, lumbar region  . Lumbar herniated disc  . Lower back pain  . Dyspepsia  . Personal history of renal cancer  . Pain, hip  . Cervicalgia  . NAFLD (nonalcoholic fatty liver disease)  . Cirrhosis of liver without ascites (CMS/HHS-HCC)  . Enthesopathy of left hip region  . Stenosis of cervical spine with myelopathy (CMS/HHS-HCC)  . Essential hypertension  . Hydrocele  .  Obesity with body mass index of 30.0-39.9, unspecified  . Thrombocytopenia ()  . Chest wall pain, chronic  . Status post cervical spinal fusion  . Diabetic polyneuropathy associated with type 2 diabetes mellitus (CMS/HHS-HCC)  . Peroneal tendon tear, right, initial encounter  . Primary osteoarthritis of knees, bilateral  . Complex regional pain syndrome type 1 of right lower extremity  . Closed nondisplaced fracture of fifth metatarsal bone of right foot  . Gastroesophageal reflux disease without esophagitis  . Other irritable bowel syndrome  . Osteoarthritis of left knee  . Type 2 diabetes mellitus with complication, with long-term current use of insulin  (CMS-HCC)  . Status post total left knee replacement  . Headache disorder  . Spondylosis of lumbar region without myelopathy or radiculopathy  . Type 2 diabetes mellitus with hyperglycemia, without long-term current use of insulin  (CMS/HHS-HCC)  . Obesity (BMI 30-39.9), unspecified  . Hematuria, gross  . Left flank pain, unspecified  . Daytime somnolence  . SOB (shortness of breath) on exertion  . Obstructive sleep apnea  . Body mass index (BMI) 40.0-44.9, adult (CMS/HHS-HCC)  . Acute gastroenteritis  . Right upper quadrant pain  . Osteoarthritis of left hip joint due to dysplasia  . Bronchitis  . Osteoarthritis of left hip  . Finding of above normal blood pressure    Outpatient Medications Prior to Visit  Medication Sig Dispense Refill  . amLODIPine  (NORVASC ) 5 MG tablet TAKE 1 TABLET BY MOUTH TWICE A DAY 180 tablet 3  .  blood-glucose sensor (FREESTYLE LIBRE 2 PLUS SENSOR) Use 1 each every 14 (fourteen) days 6 each 3  . FUROsemide (LASIX) 20 MG tablet Take 1 tablet by mouth once daily    . gabapentin  (NEURONTIN ) 400 MG capsule TAKE 1 CAPSULE BY MOUTH THREE TIMES A DAY 90 capsule 5  . insulin  ASPART PROTAMINE-ASPART (NOVOLOG  MIX 70-30FLEXPEN U-100) 100 unit/mL (70-30) pen injector Inject 80 units every morning and 75 units  every evening. 45 mL 12  . insulin  syringe-needle U-100 (BD INSULIN  SYRINGE ULTRA-FINE) 1 mL 31 gauge x 5/16 syringe USE TO INJECT INSULIN  TWICE DAILY E11.65 200 each 3  . insulin  syringe-needle U-100 (BD INSULIN  SYRINGE ULTRA-FINE) 1 mL 31 gauge x 5/16 syringe USE TO INJECT INSULIN  TWICE DAILY E11.65    . insulin  syringe-needle U-100 (BD VEO INSULIN  SYRINGE UF) 1 mL 31 gauge x 15/64 Syrg as directed    . insulin  syringe-needle U-100 1 mL 31 gauge x 5/16 syringe     . insulin  syringe-needle U-100 1 mL 31 gauge x 5/16 syringe Use to inject insulin  twice daily E11.65 200 each 3  . ipratropium (ATROVENT ) 0.06 % nasal spray Place 2 sprays into both nostrils 3 (three) times daily 15 mL 0  . losartan (COZAAR) 100 MG tablet Take 1 tablet (100 mg total) by mouth once daily 30 tablet 11  . metFORMIN (GLUCOPHAGE-XR) 500 MG XR tablet TAKE 2 TABLETS IN THE MORNING AND 1 TABLET IN THE EVENING 270 tablet 3  . methocarbamoL (ROBAXIN) 500 MG tablet Take 500 mg by mouth 3 (three) times daily as needed    . metoprolol  succinate (TOPROL -XL) 25 MG XL tablet Take 1 tablet (25 mg total) by mouth once daily 30 tablet 11  . multivitamin tablet Take 1 tablet by mouth once daily    . omeprazole (PRILOSEC) 40 MG DR capsule TAKE 1 CAPSULE BY MOUTH EVERY DAY 90 capsule 3  . pen needle, diabetic (NANO 2ND GEN PEN NEEDLE) 32 gauge x 5/32 Ndle USE 1 EACH TWICE A DAY 200 each 4  . phentermine (ADIPEX-P) 37.5 mg tablet Take 1 tablet (37.5 mg total) by mouth every morning before breakfast 30 tablet 5  . rosuvastatin  (CRESTOR ) 5 MG tablet Take 1 tablet (5 mg total) by mouth once daily 90 tablet 4  . tadalafiL  (CIALIS ) 20 MG tablet     . tiZANidine  (ZANAFLEX ) 4 MG tablet Take 1 tablet (4 mg total) by mouth 3 (three) times daily 270 tablet 3  . traMADoL  (ULTRAM ) 50 mg tablet Take 1-2 tablet of 50MG  by oral route 6-8 hours 60 tablet 0  . albuterol  90 mcg/actuation inhaler Inhale 2 inhalations into the lungs every 6 (six) hours as  needed for Wheezing for up to 14 days (Patient not taking: Reported on 01/17/2024) 1 each 0  . diclofenac  (VOLTAREN ) 1 % topical gel Apply 2 g topically 4 (four) times daily 100 g 1  . ondansetron  (ZOFRAN -ODT) 4 MG disintegrating tablet Take 4 mg by mouth every 8 (eight) hours as needed    . predniSONE  (DELTASONE ) 20 MG tablet Take 1 tablet (20 mg total) by mouth once daily 4 tablet 0   No facility-administered medications prior to visit.      Objective  Vitals:   01/29/24 1405  BP: (!) 147/83  Pulse: 74  Weight: (!) 144.3 kg (318 lb 3.2 oz)  PainSc: 0-No pain   Body mass index is 40.85 kg/m.  Home Vitals:     Physical Exam Physical Exam  Constitutional: alert, obese, in NAD, and communicates well Eye exam: sclera anicteric. Neck: supple, no thyroid enlargement or cervical adenopathy, and no bruits heard Respiratory: clear to auscultation, without rales or wheezes  Cardiovascular: regular rate and rhythm and without murmurs, rubs or gallops Lower extremities: lower extremity edema moderate Skin ankles/feet: warm, good capillary refill Neurological: sensorimotor grossly intact and normal muscle tone  Results LABS   A1c: 7.4 (01/17/2024)     Assessment/Plan:   Assessment & Plan   Type 2 Diabetes Mellitus Type 2 diabetes mellitus with recent improvement in A1c from 7.7 to 7.4.  He was reminded of dietary efforts as well as continuation with current regimen.  Essential hypertension Essential hypertension, currently managed with medication.  Levels have been slightly elevated.  Reminded the patient to take his medications.  Hypertriglyceridemia Hypertriglyceridemia with last lipid panel done nearly a year ago. - Order lipid panel to assess triglyceride levels  Cirrhosis of the liver Most recent blood work shows that levels are stable.  We will recheck today.  Renal carcinoma Patient is followed by oncology team.  Currently in remission of his renal  carcinoma. Adult Wellness Visit Routine adult wellness visit. Discussed insurance changes and potential impact on care. Reviewed health maintenance needs including PSA screening, colon cancer screening, and flu vaccination. - Order PSA level for prostate cancer screening - Perform colon cancer screening with provided container - Administer flu vaccine  Obesity As part of the visit and considering the current Body mass index is 40.85 kg/m. we discussed with the patient the importance of weight control including the reduction of portion sizes, decrease sweets, increase physical activity, etc   Diagnoses and all orders for this visit:  Type 2 diabetes mellitus with complication, with long-term current use of insulin  (CMS-HCC)  Class 3 severe obesity due to excess calories with body mass index (BMI) of 40.0 to 44.9 in adult  Essential hypertension -     Comprehensive Metabolic Panel (CMP); Future  Renal cell carcinoma of right kidney (CMS/HHS-HCC)  Cirrhosis of liver without ascites, unspecified hepatic cirrhosis type (CMS/HHS-HCC) -     Comprehensive Metabolic Panel (CMP); Future  Hypertriglyceridemia -     Lipid Panel W/Reflex Direct Low Density Lipoprotein (LDL) Cholesterol; Future  Well adult exam  Screening for prostate cancer -     Prostate Specific Antigen (PSA) Total W/Reflex Prostate Specific Antigen Free, Screen; Future  Colon cancer screening -     Occult Blood CRC (Colorectal Cancer) Screening, Stool FIT; Future  Need for vaccination -     Flu Vaccine IIV3, IM PF (33MO+)(Fluarix, Flulaval, Fluzone)  Encounter for behavioral health screening (Z13.30) -     Anxiety Screen [NUR6106]  Depression screening (Z13.31) -     Medicare Depression Screen    This visit was coded based on medical decision making (MDM).           Future Appointments     Date/Time Provider Department Center Visit Type   05/22/2024 11:15 AM Cherilyn Debby Quivers, MD [facility] CLI RETURN VISIT       There are no Patient Instructions on file for this visit.  An after visit summary was provided for the patient either in written format (printed) or through [facility].  This note has been created using automated tools and reviewed for accuracy by MARIO E OLMEDO.

## 2024-02-07 ENCOUNTER — Other Ambulatory Visit: Payer: Self-pay | Admitting: Urology

## 2024-02-07 DIAGNOSIS — E1169 Type 2 diabetes mellitus with other specified complication: Secondary | ICD-10-CM

## 2024-02-07 DIAGNOSIS — N200 Calculus of kidney: Secondary | ICD-10-CM

## 2024-02-07 DIAGNOSIS — N451 Epididymitis: Secondary | ICD-10-CM

## 2024-02-07 NOTE — Progress Notes (Signed)
 02/10/2024 9:33 AM   Anthony Fuller 1961/02/14 [id number]  Referring provider: Eliverto Bette Hover, MD [address] [place],  [place] [address]  Urological history: 1. pT1aNX right renal cell carcinoma - right radical nephrectomy (2012) - mixed cystology with both cystic, papillary and clear cell variants   2. High risk hematuria - Non-smoker - CTU (2015) - Stable postsurgical changes of right nephrectomy without evidence of local recurrence or metastatic disease in the abdomen and pelvis. Multiple nonobstructing calculi in the left kidney.  - cysto (2015) - NED  3.  Nephrolithiasis - left URS (2017) - contrast CT (12/2023) -nonobstructive left nephrolithiasis  4. BPH with LU TS - PSA (01/2024) 0.68  5. ED - Failed PDE 5 inhibitors  Chief Complaint  Patient presents with   Follow-up   epididymitis   Nephrolithiasis   HPI: Anthony Fuller is a 63 y.o. man who presents today for chronic epididymitis.   Previous records reviewed.  He has been having problems in his scrotal area for the last 2 months.  He states that sitting and sliding back-and-forth to get into his car will elicit the pain.  He states that 1 side of his scrotum is bigger than the other, but this has been longstanding.  He denied any trauma to the scrotal area, he denied any infections and he denied any swelling.  He has been having urinary urgency for the last 2 months which occurs when he touches something cold.  He has been having postvoid dribbling after every voiding and some rare urge incontinence.   He denies any nocturia, but does feel he has incomplete bladder emptying.  Patient denies any modifying or aggravating factors.  Patient denies any recent UTI's, gross hematuria, dysuria or suprapubic/flank pain.  Patient denies any fevers, chills, nausea or vomiting.  UA clear, specific gravity greater than 1.030, pH 5.5, small bilirubin, greater than 300 protein, 0-5 squamous epithelia cells,  0-5 WBCs, 6-10 RBCs, few bacteria present, mucus present and amorphous crystals present  PVR 0 mL   PSA (01/2024) 0.68  Serum creatinine (01/2024) 1.4, eGFR 57  Hemoglobin A1c (01/2024) 7.4  Diuretics: Furosemide 20 mg tablet  He does not have confidence that he could get and keep an erection, his erections are not firm enough for penetrative intercourse, he has difficulty maintaining his erections,  and he is not finding intercourse satisfactory for him.  Patient is not having spontaneous erections.  He denies any pain or curvature with erections prior to his ED. he was not having issues with ejaculations prior to his ED.  He has not had an erection in over 2 years.    Testosterone level pending   Cholesterol (01/2024) triglycerides at 162  TSH (04/2023) 2.658   Tried and failed sildenafil and tadalafil   PMH: Past Medical History:  Diagnosis Date   Acid indigestion 02/14/2012   Anxiety    Arthritis    BIL.KNEES   Bulge of lumbar disc without myelopathy 09/05/2011   Cancer (HCC) 2012   R kidney removed   Cervical stenosis of spine    Cervicalgia    Chest wall pain    Cirrhosis of liver (HCC)    Clostridium difficile infection 02/04/2014   DDD (degenerative disc disease), lumbar    DDD (degenerative disc disease), lumbar    Degeneration of intervertebral disc of lumbar region 08/23/2011   Diabetes mellitus (HCC) 07/24/2011   Diabetes mellitus without complication (HCC)    Diabetic polyneuropathy associated with type  2 diabetes mellitus (HCC)    Diarrhea    DM (diabetes mellitus) type 2, uncontrolled, with ketoacidosis (HCC) 05/30/2015   Dyspepsia    Dyspnea    Enthesopathy of hip 04/11/2015   Fatty liver disease, nonalcoholic 09/12/2014   GERD (gastroesophageal reflux disease)    Headache    Hematuria    Hepatic cirrhosis (HCC) 09/12/2014   Hydrocele    Hydrocele    Hypertension    IBS (irritable bowel syndrome)    Lumbar herniated disc    Lumbar radiculopathy  09/05/2011   Lumbar stenosis    NAFLD (nonalcoholic fatty liver disease)    Peroneal tendon injury, right, initial encounter    Peroneal tendon tear    Radiculopathy    Renal cancer [facility])    Renal insufficiency    Right Nephrectomy due to RCC.   Sleep apnea    snores   Stenosis of cervical spine    Thoracic and lumbosacral neuritis 07/24/2011   Thrombocytopathia (HCC)    Thrombocytopenia     Surgical History: Past Surgical History:  Procedure Laterality Date   BACK SURGERY     cervical fusion C5-7   CARDIAC CATHETERIZATION Right 06/20/2015   Procedure: Left Heart Cath and Coronary Angiography;  Surgeon: Denyse DELENA Bathe, MD;  Location: [facility] INVASIVE CV LAB;  Service: Cardiovascular;  Laterality: Right;   COLONOSCOPY     CYSTOSCOPY W/ RETROGRADES Left 05/18/2015   Procedure: CYSTOSCOPY WITH RETROGRADE PYELOGRAM;  Surgeon: Redell Lynwood Napoleon, MD;  Location: [facility] ORS;  Service: Urology;  Laterality: Left;   CYSTOSCOPY WITH STENT PLACEMENT Left 05/18/2015   Procedure: CYSTOSCOPY WITH STENT PLACEMENT;  Surgeon: Redell Lynwood Napoleon, MD;  Location: [facility] ORS;  Service: Urology;  Laterality: Left;   CYSTOSCOPY WITH STENT PLACEMENT Left 06/28/2015   Procedure: CYSTOSCOPY WITH STENT PLACEMENT/ EXCHANGE;  Surgeon: Rosina Riis, MD;  Location: [facility] ORS;  Service: Urology;  Laterality: Left;   ESOPHAGOGASTRODUODENOSCOPY (EGD) WITH PROPOFOL  N/A 09/03/2016   Procedure: ESOPHAGOGASTRODUODENOSCOPY (EGD) WITH PROPOFOL ;  Surgeon: Lamar ONEIDA Holmes, MD;  Location: [facility] ENDOSCOPY;  Service: Endoscopy;  Laterality: N/A;   ESOPHAGOGASTRODUODENOSCOPY ENDOSCOPY N/A 03/14/2018   Procedure: ESOPHAGOGASTRODUODENOSCOPY ENDOSCOPY;  Surgeon: Holmes Lamar ONEIDA, MD;  Location: [facility] ENDOSCOPY;  Service: Endoscopy;  Laterality: N/A;   HALLUX VALGUS BASE WEDGE Right 08/23/2017   Procedure: HALLUX VALGUS BASE WEDGE;  Surgeon: Ashley Soulier, DPM;  Location: [facility] ORS;  Service: Podiatry;  Laterality: Right;   INSERTION  OF MESH  02/27/2021   Procedure: INSERTION OF MESH;  Surgeon: Rodolph Romano, MD;  Location: [facility] ORS;  Service: General;;   JOINT REPLACEMENT     right knee    kidney removed Right    2012   NAFLD     NEPHRECTOMY Right 04/2011   ORIF TOE FRACTURE Right 08/23/2017   Procedure: OPEN REDUCTION INTERNAL FIXATION (ORIF) METATARSAL (TOE) FRACTURE;  Surgeon: Ashley Soulier, DPM;  Location: [facility] ORS;  Service: Podiatry;  Laterality: Right;   OSTEOTOMY     SPINE SURGERY     lumbar 2013   URETEROSCOPY WITH HOLMIUM LASER LITHOTRIPSY Left 05/18/2015   Procedure: URETEROSCOPY WITH HOLMIUM LASER LITHOTRIPSY;  Surgeon: Redell Lynwood Napoleon, MD;  Location: [facility] ORS;  Service: Urology;  Laterality: Left;   URETEROSCOPY WITH HOLMIUM LASER LITHOTRIPSY Left 06/28/2015   Procedure: URETEROSCOPY WITH HOLMIUM LASER LITHOTRIPSY;  Surgeon: Rosina Riis, MD;  Location: [facility] ORS;  Service: Urology;  Laterality: Left;    Home Medications:  Allergies as of 02/10/2024  Reactions   Hydromorphone Other (See Comments)   Severe hypotension Hypotension  Severe hypotension Other Reaction(s): decreased blood pressure   Aspirin  Other (See Comments)   Hx GI bleed GI bleeding Other reaction(s): Other (See Comments)    Hx GI bleed    GI bleeding   Nsaids    Hx of GI bleed   Floxin [ofloxacin] Rash        Medication List        Accurate as of February 10, 2024  9:33 AM. If you have any questions, ask your nurse or doctor.          AeroChamber MV inhaler Use as instructed   albuterol  108 (90 Base) MCG/ACT inhaler Commonly known as: VENTOLIN  HFA Inhale 2 puffs into the lungs every 4 (four) hours as needed.   cetirizine 10 MG tablet Commonly known as: ZYRTEC Take 10 mg by mouth daily as needed for allergies.   clopidogrel  75 MG tablet Commonly known as: PLAVIX  Take 75 mg by mouth daily.   diclofenac  Sodium 1 % Gel Commonly known as: VOLTAREN  Apply 2 g topically 4 (four) times  daily.   gabapentin  400 MG capsule Commonly known as: NEURONTIN  Take 400 mg by mouth 3 (three) times daily.   insulin  NPH-regular Human (70-30) 100 UNIT/ML injection Inject 55-60 Units into the skin See admin instructions. Inject 60 units in the morning and 55 units at night   ipratropium 0.06 % nasal spray Commonly known as: ATROVENT  Place 2 sprays into both nostrils 4 (four) times daily.   losartan 50 MG tablet Commonly known as: COZAAR Take 50 mg by mouth daily.   metFORMIN 500 MG 24 hr tablet Commonly known as: GLUCOPHAGE-XR Take 500-1,000 mg by mouth See admin instructions. Take 1000 mg in the morning and 500 mg in the evening   methocarbamol 500 MG tablet Commonly known as: ROBAXIN Take 500 mg by mouth every 8 (eight) hours as needed for muscle spasms.   NovoLOG  Mix 70/30 FlexPen (70-30) 100 UNIT/ML FlexPen Generic drug: insulin  aspart protamine - aspart INJECT 80 UNITS EVERY MORNING AND 75 UNITS EVERY EVENING.   omeprazole 40 MG capsule Commonly known as: PRILOSEC TAKE 1 CAPSULE BY MOUTH EVERY DAY   promethazine -dextromethorphan  6.25-15 MG/5ML syrup Commonly known as: PROMETHAZINE -DM Take 5 mLs by mouth 4 (four) times daily as needed.   rosuvastatin  5 MG tablet Commonly known as: CRESTOR  Take 5 mg by mouth daily.   tiZANidine  4 MG capsule Commonly known as: ZANAFLEX  Take 4 mg by mouth 3 (three) times daily.   traMADol  50 MG tablet Commonly known as: ULTRAM  Take 50 mg by mouth every 6 (six) hours as needed for moderate pain. for pain        Allergies:  Allergies  Allergen Reactions   Hydromorphone Other (See Comments)    Severe hypotension  Hypotension  Severe hypotension  Other Reaction(s): decreased blood pressure   Aspirin  Other (See Comments)    Hx GI bleed  GI bleeding  Other reaction(s): Other (See Comments)    Hx GI bleed    GI bleeding   Nsaids     Hx of GI bleed   Floxin [Ofloxacin] Rash    Family History: Family History   Problem Relation Age of Onset   Diabetes Mother    Hypertension Mother    Asthma Mother    Heart disease Father    Kidney disease Father    Diabetes Father    Stroke Father     Social  History:  reports that he has never smoked. He has never used smokeless tobacco. He reports that he does not currently use alcohol. He reports that he does not use drugs.  ROS: Pertinent ROS in HPI  Physical Exam: BP (!) 160/86 (BP Location: Left Arm, Patient Position: Sitting, Cuff Size: Normal)   Pulse 75   Wt (!) 318 lb (144.2 kg)   SpO2 97%   BMI 40.83 kg/m   Constitutional:  Well nourished. Alert and oriented, No acute distress. HEENT: [place] AT, moist mucus membranes.  Trachea midline Cardiovascular: No clubbing, cyanosis, or edema. Respiratory: Normal respiratory effort, no increased work of breathing. GU: No CVA tenderness.  No bladder fullness or masses.  Patient with uncircumcised phallus.  Foreskin easily retracted   Urethral meatus is patent.  No penile discharge. No penile lesions or rashes. Scrotum without lesions, cysts, rashes and/or edema.  Testicles are located scrotally bilaterally. No masses are appreciated in the testicles. Left and right epididymis are normal. Rectal: Patient with  normal sphincter tone. Anus and perineum without scarring or rashes. No rectal masses are appreciated. Prostate is approximately 50 + grams, could not palpate the entire gland, no nodules are appreciated. Seminal vesicles could not be palpated.  Neurologic: Grossly intact, no focal deficits, moving all 4 extremities. Psychiatric: Normal mood and affect.  Laboratory Data: See HPI and EPIC I have reviewed the labs.   Pertinent Imaging:  02/10/24 09:26  Scan Result    Assessment & Plan:    1. Scrotal pain - UA w/ micro heme - urine culture pending - exam benign - obtain a scrotal ultrasound to rule out any occult pathology  2. BPH with LU TS - worsening, moderate symptoms  - PSA up to  date  - DRE benign  - UA benign w/ micro heme - PVR < 300 cc  - most bothersome symptoms are post void dribbling and urgency - encouraged avoiding bladder irritants, fluid restriction before bedtime and timed voiding's - educated on red flag symptoms: acute retention, gross hematuria, fever, severe pain - advised to call clinic or go to the ED if these occur  3. ED - testosterone level pending - Has failed PDE 5 inhibitors, has been offered ICI in the past, but he states he is scared of needles, we will address this further at subsequent appointments  4. Renal cell carcinoma - right nephrectomy (2012) -no recurrence  5.  Nephrolithiasis - UA w/ micro heme - KUB 11 mm left lower pole stone  6. Microscopic hematuria - UA w/ 6-10 RBC's - Urine culture pending -did not prescribe an antibiotic at this time as he was not having UTI symptoms - May be secondary to left renal stone, urinary tract infection or BPH, will continue to monitor, may need cystoscopy if urine culture is negative or his LUTS persists along with microscopic hematuria  Return in about 3 weeks (around 03/02/2024) for scrotal US  report .  These notes generated with voice recognition software. I apologize for typographical errors.  CLOTILDA HELON RIGGERS  [facility] [address]  [address] [place], [place] [address] [phone]

## 2024-02-10 ENCOUNTER — Other Ambulatory Visit
Admission: RE | Admit: 2024-02-10 | Discharge: 2024-02-10 | Disposition: A | Source: Home / Self Care | Attending: Urology | Admitting: Urology

## 2024-02-10 ENCOUNTER — Ambulatory Visit
Admission: RE | Admit: 2024-02-10 | Discharge: 2024-02-10 | Disposition: A | Discharge status: Home / Self Care | Source: Ambulatory Visit | Attending: Urology | Admitting: Urology

## 2024-02-10 ENCOUNTER — Ambulatory Visit: Admitting: Urology

## 2024-02-10 ENCOUNTER — Encounter: Payer: Self-pay | Admitting: Urology

## 2024-02-10 ENCOUNTER — Ambulatory Visit
Admission: RE | Admit: 2024-02-10 | Discharge: 2024-02-10 | Disposition: A | Discharge status: Home / Self Care | Attending: Urology | Admitting: Urology

## 2024-02-10 VITALS — BP 160/86 | HR 75 | Wt 318.0 lb

## 2024-02-10 DIAGNOSIS — N451 Epididymitis: Secondary | ICD-10-CM | POA: Insufficient documentation

## 2024-02-10 DIAGNOSIS — N5082 Scrotal pain: Secondary | ICD-10-CM | POA: Diagnosis not present

## 2024-02-10 DIAGNOSIS — N401 Enlarged prostate with lower urinary tract symptoms: Secondary | ICD-10-CM | POA: Diagnosis not present

## 2024-02-10 DIAGNOSIS — N529 Male erectile dysfunction, unspecified: Secondary | ICD-10-CM

## 2024-02-10 DIAGNOSIS — N521 Erectile dysfunction due to diseases classified elsewhere: Secondary | ICD-10-CM | POA: Insufficient documentation

## 2024-02-10 DIAGNOSIS — E291 Testicular hypofunction: Secondary | ICD-10-CM | POA: Insufficient documentation

## 2024-02-10 DIAGNOSIS — E1169 Type 2 diabetes mellitus with other specified complication: Secondary | ICD-10-CM | POA: Diagnosis present

## 2024-02-10 DIAGNOSIS — N200 Calculus of kidney: Secondary | ICD-10-CM | POA: Insufficient documentation

## 2024-02-10 DIAGNOSIS — Z85528 Personal history of other malignant neoplasm of kidney: Secondary | ICD-10-CM

## 2024-02-10 DIAGNOSIS — N138 Other obstructive and reflux uropathy: Secondary | ICD-10-CM

## 2024-02-10 DIAGNOSIS — R3129 Other microscopic hematuria: Secondary | ICD-10-CM

## 2024-02-10 LAB — URINALYSIS, COMPLETE (UACMP) WITH MICROSCOPIC
Glucose, UA: NEGATIVE mg/dL
Hgb urine dipstick: NEGATIVE
Ketones, ur: NEGATIVE mg/dL
Leukocytes,Ua: NEGATIVE
Nitrite: NEGATIVE
Protein, ur: >300 mg/dL — AB
Specific Gravity, Urine: >1.03 — ABNORMAL HIGH (ref 1.005–1.030)
pH: 5.5 (ref 5.0–8.0)

## 2024-02-10 LAB — BLADDER SCAN AMB NON-IMAGING

## 2024-02-10 NOTE — Addendum Note (Signed)
 Addended by: SHERRE MONTIE DEL on: 02/10/2024 08:26 AM   Modules accepted: Orders

## 2024-02-10 NOTE — Patient Instructions (Signed)
 We have ordered a scrotal ultrasound and the scheduling department should reach out to you to schedule this appointment.  Sometimes, they have difficulty reaching patients because they are calling from an unknown number and many people have their phones set to ignore unknown numbers.  They do try to leave messages, but sometimes voicemails are not set up or mailboxes are full.  If you have not heard from the scheduling department in a week, please call [phone] to schedule your scrotal ultrasound

## 2024-02-11 ENCOUNTER — Other Ambulatory Visit: Payer: Self-pay

## 2024-02-11 ENCOUNTER — Ambulatory Visit

## 2024-02-11 ENCOUNTER — Ambulatory Visit: Payer: Self-pay | Admitting: Urology

## 2024-02-11 DIAGNOSIS — E1169 Type 2 diabetes mellitus with other specified complication: Secondary | ICD-10-CM

## 2024-02-11 LAB — URINE CULTURE: Culture: NO GROWTH

## 2024-02-11 LAB — TESTOSTERONE: Testosterone: 133 ng/dL — ABNORMAL LOW (ref 264–916)

## 2024-02-18 ENCOUNTER — Other Ambulatory Visit
Admission: RE | Admit: 2024-02-18 | Discharge: 2024-02-18 | Disposition: A | Discharge status: Home / Self Care | Source: Home / Self Care | Attending: Urology | Admitting: Urology

## 2024-02-18 ENCOUNTER — Ambulatory Visit
Admission: RE | Admit: 2024-02-18 | Discharge: 2024-02-18 | Disposition: A | Discharge status: Home / Self Care | Source: Ambulatory Visit | Attending: Urology | Admitting: Urology

## 2024-02-18 DIAGNOSIS — N50819 Testicular pain, unspecified: Secondary | ICD-10-CM | POA: Insufficient documentation

## 2024-02-18 DIAGNOSIS — N521 Erectile dysfunction due to diseases classified elsewhere: Secondary | ICD-10-CM | POA: Diagnosis present

## 2024-02-18 DIAGNOSIS — E1169 Type 2 diabetes mellitus with other specified complication: Secondary | ICD-10-CM | POA: Diagnosis present

## 2024-02-18 DIAGNOSIS — I861 Scrotal varices: Secondary | ICD-10-CM | POA: Insufficient documentation

## 2024-02-18 DIAGNOSIS — N5082 Scrotal pain: Secondary | ICD-10-CM | POA: Insufficient documentation

## 2024-02-19 ENCOUNTER — Other Ambulatory Visit: Payer: Self-pay | Admitting: Urology

## 2024-02-19 ENCOUNTER — Ambulatory Visit: Payer: Self-pay | Admitting: Urology

## 2024-02-19 DIAGNOSIS — E1169 Type 2 diabetes mellitus with other specified complication: Secondary | ICD-10-CM

## 2024-02-19 LAB — TESTOSTERONE: Testosterone: 137 ng/dL — ABNORMAL LOW (ref 264–916)

## 2024-02-20 ENCOUNTER — Other Ambulatory Visit
Admission: RE | Admit: 2024-02-20 | Discharge: 2024-02-20 | Disposition: A | Discharge status: Home / Self Care | Attending: Urology | Admitting: Urology

## 2024-02-20 DIAGNOSIS — E1169 Type 2 diabetes mellitus with other specified complication: Secondary | ICD-10-CM | POA: Insufficient documentation

## 2024-02-20 DIAGNOSIS — E291 Testicular hypofunction: Secondary | ICD-10-CM | POA: Diagnosis not present

## 2024-02-20 DIAGNOSIS — N521 Erectile dysfunction due to diseases classified elsewhere: Secondary | ICD-10-CM | POA: Insufficient documentation

## 2024-02-21 ENCOUNTER — Ambulatory Visit: Payer: Self-pay | Admitting: Urology

## 2024-02-21 LAB — TESTOSTERONE: Testosterone: 108 ng/dL — ABNORMAL LOW (ref 264–916)

## 2024-03-01 NOTE — Progress Notes (Unsigned)
 03/02/2024 7:03 PM   Anthony Fuller 1960-10-14 [id number]  Referring provider: Eliverto Bette Hover, MD [address] [place],  [place] [address]  Urological history: 1. pT1aNX right renal cell carcinoma - right radical nephrectomy (2012) - mixed cystology with both cystic, papillary and clear cell variants   2. High risk hematuria - Non-smoker - CTU (2015) - Stable postsurgical changes of right nephrectomy without evidence of local recurrence or metastatic disease in the abdomen and pelvis. Multiple nonobstructing calculi in the left kidney.  - cysto (2015) - NED  3.  Nephrolithiasis - left URS (2017) - contrast CT (12/2023) -nonobstructive left nephrolithiasis  4. BPH with LU TS - PSA (01/2024) 0.68  5. ED - Failed PDE 5 inhibitors  No chief complaint on file.  HPI: Anthony Fuller is a 63 y.o. man who presents today for scrotal ultrasound report.  Previous records reviewed.  I saw him on February 10, 2024 for scrotal pain, worsening BPH with LUTS and erectile dysfunction.  He is close scrotal exam was benign, he is urinalysis had micro heme but urine culture was negative.  We checked a morning testosterone level which returned at 133 and his second morning testosterone a week later was 108.  He had failed PDE 5 inhibitors and was not interested in ICI because he was afraid of needles.  He was also having issues with postvoid dribbling and urgency.  PSA was normal and PVR demonstrated adequate bladder emptying.  Scrotal ultrasound completed on February 18, 2024 noted mild left varicocele.  He has been experiencing reduced energy, reduced endurance, diminished work performance, diminished physical performance, loss of body hair, reduced beard growth, fatigue, reduced lean muscle mass and obesity.  He has also noticed depressive symptoms, cognitive dysfunction, reduced motivation, poor concentration, poor memory and irritability.  There is also been a decreased in his  libido and issues with erectile dysfunction.  He has a history of a prolonged period and sustained-release opioids, sleep apnea, and Type 2 diabetes mellitus.   PMH: Past Medical History:  Diagnosis Date   Acid indigestion 02/14/2012   Anxiety    Arthritis    BIL.KNEES   Bulge of lumbar disc without myelopathy 09/05/2011   Cancer (HCC) 2012   R kidney removed   Cervical stenosis of spine    Cervicalgia    Chest wall pain    Cirrhosis of liver (HCC)    Clostridium difficile infection 02/04/2014   DDD (degenerative disc disease), lumbar    DDD (degenerative disc disease), lumbar    Degeneration of intervertebral disc of lumbar region 08/23/2011   Diabetes mellitus (HCC) 07/24/2011   Diabetes mellitus without complication (HCC)    Diabetic polyneuropathy associated with type 2 diabetes mellitus (HCC)    Diarrhea    DM (diabetes mellitus) type 2, uncontrolled, with ketoacidosis (HCC) 05/30/2015   Dyspepsia    Dyspnea    Enthesopathy of hip 04/11/2015   Fatty liver disease, nonalcoholic 09/12/2014   GERD (gastroesophageal reflux disease)    Headache    Hematuria    Hepatic cirrhosis (HCC) 09/12/2014   Hydrocele    Hydrocele    Hypertension    IBS (irritable bowel syndrome)    Lumbar herniated disc    Lumbar radiculopathy 09/05/2011   Lumbar stenosis    NAFLD (nonalcoholic fatty liver disease)    Peroneal tendon injury, right, initial encounter    Peroneal tendon tear    Radiculopathy    Renal cancer (HCC)  Renal insufficiency    Right Nephrectomy due to RCC.   Sleep apnea    snores   Stenosis of cervical spine    Thoracic and lumbosacral neuritis 07/24/2011   Thrombocytopathia (HCC)    Thrombocytopenia     Surgical History: Past Surgical History:  Procedure Laterality Date   BACK SURGERY     cervical fusion C5-7   CARDIAC CATHETERIZATION Right 06/20/2015   Procedure: Left Heart Cath and Coronary Angiography;  Surgeon: Denyse DELENA Bathe, MD;  Location: [facility]  INVASIVE CV LAB;  Service: Cardiovascular;  Laterality: Right;   COLONOSCOPY     CYSTOSCOPY W/ RETROGRADES Left 05/18/2015   Procedure: CYSTOSCOPY WITH RETROGRADE PYELOGRAM;  Surgeon: Redell Lynwood Napoleon, MD;  Location: [facility] ORS;  Service: Urology;  Laterality: Left;   CYSTOSCOPY WITH STENT PLACEMENT Left 05/18/2015   Procedure: CYSTOSCOPY WITH STENT PLACEMENT;  Surgeon: Redell Lynwood Napoleon, MD;  Location: [facility] ORS;  Service: Urology;  Laterality: Left;   CYSTOSCOPY WITH STENT PLACEMENT Left 06/28/2015   Procedure: CYSTOSCOPY WITH STENT PLACEMENT/ EXCHANGE;  Surgeon: Rosina Riis, MD;  Location: [facility] ORS;  Service: Urology;  Laterality: Left;   ESOPHAGOGASTRODUODENOSCOPY (EGD) WITH PROPOFOL  N/A 09/03/2016   Procedure: ESOPHAGOGASTRODUODENOSCOPY (EGD) WITH PROPOFOL ;  Surgeon: Lamar ONEIDA Holmes, MD;  Location: [facility] ENDOSCOPY;  Service: Endoscopy;  Laterality: N/A;   ESOPHAGOGASTRODUODENOSCOPY ENDOSCOPY N/A 03/14/2018   Procedure: ESOPHAGOGASTRODUODENOSCOPY ENDOSCOPY;  Surgeon: Holmes Lamar ONEIDA, MD;  Location: [facility] ENDOSCOPY;  Service: Endoscopy;  Laterality: N/A;   HALLUX VALGUS BASE WEDGE Right 08/23/2017   Procedure: HALLUX VALGUS BASE WEDGE;  Surgeon: Ashley Soulier, DPM;  Location: [facility] ORS;  Service: Podiatry;  Laterality: Right;   INSERTION OF MESH  02/27/2021   Procedure: INSERTION OF MESH;  Surgeon: Rodolph Romano, MD;  Location: [facility] ORS;  Service: General;;   JOINT REPLACEMENT     right knee    kidney removed Right    2012   NAFLD     NEPHRECTOMY Right 04/2011   ORIF TOE FRACTURE Right 08/23/2017   Procedure: OPEN REDUCTION INTERNAL FIXATION (ORIF) METATARSAL (TOE) FRACTURE;  Surgeon: Ashley Soulier, DPM;  Location: [facility] ORS;  Service: Podiatry;  Laterality: Right;   OSTEOTOMY     SPINE SURGERY     lumbar 2013   URETEROSCOPY WITH HOLMIUM LASER LITHOTRIPSY Left 05/18/2015   Procedure: URETEROSCOPY WITH HOLMIUM LASER LITHOTRIPSY;  Surgeon: Redell Lynwood Napoleon, MD;  Location:  [facility] ORS;  Service: Urology;  Laterality: Left;   URETEROSCOPY WITH HOLMIUM LASER LITHOTRIPSY Left 06/28/2015   Procedure: URETEROSCOPY WITH HOLMIUM LASER LITHOTRIPSY;  Surgeon: Rosina Riis, MD;  Location: [facility] ORS;  Service: Urology;  Laterality: Left;    Home Medications:  Allergies as of 03/02/2024       Reactions   Hydromorphone Other (See Comments)   Severe hypotension Hypotension  Severe hypotension Other Reaction(s): decreased blood pressure   Aspirin  Other (See Comments)   Hx GI bleed GI bleeding Other reaction(s): Other (See Comments)    Hx GI bleed    GI bleeding   Nsaids    Hx of GI bleed   Floxin [ofloxacin] Rash        Medication List        Accurate as of March 01, 2024  7:03 PM. If you have any questions, ask your nurse or doctor.          AeroChamber MV inhaler Use as instructed   albuterol  108 (90 Base) MCG/ACT inhaler Commonly known as: VENTOLIN  HFA Inhale 2 puffs into the  lungs every 4 (four) hours as needed.   cetirizine 10 MG tablet Commonly known as: ZYRTEC Take 10 mg by mouth daily as needed for allergies.   clopidogrel  75 MG tablet Commonly known as: PLAVIX  Take 75 mg by mouth daily.   diclofenac  Sodium 1 % Gel Commonly known as: VOLTAREN  Apply 2 g topically 4 (four) times daily.   gabapentin  400 MG capsule Commonly known as: NEURONTIN  Take 400 mg by mouth 3 (three) times daily.   insulin  NPH-regular Human (70-30) 100 UNIT/ML injection Inject 55-60 Units into the skin See admin instructions. Inject 60 units in the morning and 55 units at night   ipratropium 0.06 % nasal spray Commonly known as: ATROVENT  Place 2 sprays into both nostrils 4 (four) times daily.   losartan 50 MG tablet Commonly known as: COZAAR Take 50 mg by mouth daily.   metFORMIN 500 MG 24 hr tablet Commonly known as: GLUCOPHAGE-XR Take 500-1,000 mg by mouth See admin instructions. Take 1000 mg in the morning and 500 mg in the evening    methocarbamol 500 MG tablet Commonly known as: ROBAXIN Take 500 mg by mouth every 8 (eight) hours as needed for muscle spasms.   NovoLOG  Mix 70/30 FlexPen (70-30) 100 UNIT/ML FlexPen Generic drug: insulin  aspart protamine - aspart INJECT 80 UNITS EVERY MORNING AND 75 UNITS EVERY EVENING.   omeprazole 40 MG capsule Commonly known as: PRILOSEC TAKE 1 CAPSULE BY MOUTH EVERY DAY   promethazine -dextromethorphan  6.25-15 MG/5ML syrup Commonly known as: PROMETHAZINE -DM Take 5 mLs by mouth 4 (four) times daily as needed.   rosuvastatin  5 MG tablet Commonly known as: CRESTOR  Take 5 mg by mouth daily.   tiZANidine  4 MG capsule Commonly known as: ZANAFLEX  Take 4 mg by mouth 3 (three) times daily.   traMADol  50 MG tablet Commonly known as: ULTRAM  Take 50 mg by mouth every 6 (six) hours as needed for moderate pain. for pain        Allergies:  Allergies  Allergen Reactions   Hydromorphone Other (See Comments)    Severe hypotension  Hypotension  Severe hypotension  Other Reaction(s): decreased blood pressure   Aspirin  Other (See Comments)    Hx GI bleed  GI bleeding  Other reaction(s): Other (See Comments)    Hx GI bleed    GI bleeding   Nsaids     Hx of GI bleed   Floxin [Ofloxacin] Rash    Family History: Family History  Problem Relation Age of Onset   Diabetes Mother    Hypertension Mother    Asthma Mother    Heart disease Father    Kidney disease Father    Diabetes Father    Stroke Father     Social History:  reports that he has never smoked. He has never used smokeless tobacco. He reports that he does not currently use alcohol. He reports that he does not use drugs.  ROS: Pertinent ROS in HPI  Physical Exam: There were no vitals taken for this visit.  Constitutional:  Well nourished. Alert and oriented, No acute distress. HEENT: [place] AT, moist mucus membranes.  Trachea midline Cardiovascular: No clubbing, cyanosis, or edema. Respiratory: Normal  respiratory effort, no increased work of breathing. Neurologic: Grossly intact, no focal deficits, moving all 4 extremities. Psychiatric: Normal mood and affect.   Laboratory Data: See HPI and EPIC I have reviewed the labs.   Pertinent Imaging: N/A  Assessment & Plan:    1. Left varicocele - Explained to the patient that  his varicocele doesn't require intervention - Encouraged the use of supportive underwear - CT urogram and cystoscopy are pending and may reveal a source of referred pain for his scrotal pain, but not likely, if he continues to experience scrotal pain despite conservative management, may consider referral to Dr. Lovie at [facility]   2. BPH with LU TS - having issues with post void dribbling and urgency - cysto pending   3. ED - Has failed PDE 5 inhibitors, has been offered ICI in the past, but he states he is scared of needles, we will address this further at subsequent appointments - found to hypogonadal, will assess once testosterone levels are therapeutic   4. Renal cell carcinoma - right nephrectomy (2012)  - CTU pending  5.  Nephrolithiasis - UA w/ micro heme - KUB 11 mm left lower pole stone - CTU pending   6. Hypogonadism  - explained that the diagnosis of testosterone deficiency/hypogonadism requires two morning testosterones at least two days apart below 300 to meet criteria, which he has met - LH and prolactin checked today - will follow pending those results    7. Microscopic hematuria - UA w/ 6-10 RBC's - we discussed that there are a number of causes that can be associated with blood in the urine, such as stones, BPH, UTI's, damage to the urinary tract and/or cancer.   Sometimes, we do not find a cause or source of the hematuria.   -we discussed that the recommended protocol for further work up are are CT urogram and cysto  -we discussed that for a CT urogram a contrast material will be injected into a vein and that in rare instances,  an allergic reaction can result and may even life threatening (1:100,000)  The patient denies any allergies to contrast, iodine  and/or seafood and is not taking metformin. - we discussed that following the imaging study,  a cystoscopy is performed  -we discussed that a cystoscopy is a procedure that consists of passing a camera up their urethra after administering lidocaine  to anesthetize and that after the procedure a minor amount of blood in the urine and/or burning which usually resolves in 24 to 48 hours may occur  -the patient had the opportunity to ask questions which were answered. Based upon this discussion, the patient is willing to proceed. Therefore, I've ordered: a CT Urogram and cystoscopy w/ Dr. Twylla  -the patient will return following all of the above for discussion of the results.    No follow-ups on file.  These notes generated with voice recognition software. I apologize for typographical errors.  CLOTILDA HELON RIGGERS  [facility] [address]  [address] [place], [place] [address] [phone]

## 2024-03-02 ENCOUNTER — Other Ambulatory Visit
Admission: RE | Admit: 2024-03-02 | Discharge: 2024-03-02 | Disposition: A | Discharge status: Home / Self Care | Attending: Urology | Admitting: Urology

## 2024-03-02 ENCOUNTER — Other Ambulatory Visit: Payer: Self-pay

## 2024-03-02 ENCOUNTER — Encounter: Payer: Self-pay | Admitting: Urology

## 2024-03-02 ENCOUNTER — Ambulatory Visit: Admitting: Urology

## 2024-03-02 VITALS — BP 177/90 | HR 87 | Wt 314.4 lb

## 2024-03-02 DIAGNOSIS — R3129 Other microscopic hematuria: Secondary | ICD-10-CM | POA: Diagnosis not present

## 2024-03-02 DIAGNOSIS — N529 Male erectile dysfunction, unspecified: Secondary | ICD-10-CM | POA: Insufficient documentation

## 2024-03-02 DIAGNOSIS — I861 Scrotal varices: Secondary | ICD-10-CM | POA: Diagnosis not present

## 2024-03-02 DIAGNOSIS — E291 Testicular hypofunction: Secondary | ICD-10-CM | POA: Diagnosis not present

## 2024-03-02 NOTE — Patient Instructions (Signed)
 Please Call [phone] to schedule CT scan   Cystoscopy Cystoscopy is a procedure that is used to help diagnose and sometimes treat conditions that affect the lower urinary tract. The lower urinary tract includes the bladder and the urethra. The urethra is the tube that drains urine from the bladder. Cystoscopy is done using a thin, tube-shaped instrument with a light and camera at the end (cystoscope). The cystoscope may be hard or flexible, depending on the goal of the procedure. The cystoscope is inserted through the urethra, into the bladder. Cystoscopy may be recommended if you have: Urinary tract infections that keep coming back. Blood in the urine (hematuria). An inability to control when you urinate (urinary incontinence) or an overactive bladder. Unusual cells found in a urine sample. A blockage in the urethra, such as a urinary stone. Painful urination. An abnormality in the bladder found during an intravenous pyelogram (IVP) or CT scan. What are the risks? Generally, this is a safe procedure. However, problems may occur, including: Infection. Bleeding.  What happens during the procedure?  You will be given one or more of the following: A medicine to numb the area (local anesthetic). The area around the opening of your urethra will be cleaned. The cystoscope will be passed through your urethra into your bladder. Germ-free (sterile) fluid will flow through the cystoscope to fill your bladder. The fluid will stretch your bladder so that your health care provider can clearly examine your bladder walls. Your doctor will look at the urethra and bladder. The cystoscope will be removed The procedure may vary among health care providers  What can I expect after the procedure? After the procedure, it is common to have: Some soreness or pain in your urethra. Urinary symptoms. These include: Mild pain or burning when you urinate. Pain should stop within a few minutes after you  urinate. This may last for up to a few days after the procedure. A small amount of blood in your urine for several days. Feeling like you need to urinate but producing only a small amount of urine. Follow these instructions at home: General instructions Return to your normal activities as told by your health care provider.  Drink plenty of fluids after the procedure. Keep all follow-up visits as told by your health care provider. This is important. Contact a health care provider if you: Have pain that gets worse or does not get better with medicine, especially pain when you urinate lasting longer than 72 hours after the procedure. Have trouble urinating. Get help right away if you: Have blood clots in your urine. Have a fever or chills. Are unable to urinate. Summary Cystoscopy is a procedure that is used to help diagnose and sometimes treat conditions that affect the lower urinary tract. Cystoscopy is done using a thin, tube-shaped instrument with a light and camera at the end. After the procedure, it is common to have some soreness or pain in your urethra. It is normal to have blood in your urine after the procedure.  If you were prescribed an antibiotic medicine, take it as told by your health care provider.  This information is not intended to replace advice given to you by your health care provider. Make sure you discuss any questions you have with your health care provider. Document Revised: 04/22/2018 Document Reviewed: 04/22/2018 Elsevier Patient Education  2020 [employer].

## 2024-03-03 ENCOUNTER — Ambulatory Visit: Payer: Self-pay | Admitting: Urology

## 2024-03-03 LAB — LUTEINIZING HORMONE: LH: 4.9 m[IU]/mL (ref 1.7–8.6)

## 2024-03-03 LAB — PROLACTIN: Prolactin: 9.4 ng/mL (ref 3.6–25.2)

## 2024-03-10 ENCOUNTER — Ambulatory Visit
Admission: RE | Admit: 2024-03-10 | Discharge: 2024-03-10 | Disposition: A | Discharge status: Home / Self Care | Source: Ambulatory Visit | Attending: Urology | Admitting: Urology

## 2024-03-10 DIAGNOSIS — R3129 Other microscopic hematuria: Secondary | ICD-10-CM | POA: Insufficient documentation

## 2024-03-12 ENCOUNTER — Ambulatory Visit: Payer: Self-pay | Admitting: Urology

## 2024-03-31 ENCOUNTER — Other Ambulatory Visit: Admitting: Urology

## 2024-04-14 ENCOUNTER — Encounter: Payer: Self-pay | Admitting: Urology

## 2024-04-14 ENCOUNTER — Ambulatory Visit: Admitting: Urology

## 2024-04-14 VITALS — BP 193/76 | HR 118 | Ht 74.0 in | Wt 314.0 lb

## 2024-04-14 DIAGNOSIS — N401 Enlarged prostate with lower urinary tract symptoms: Secondary | ICD-10-CM | POA: Diagnosis not present

## 2024-04-14 DIAGNOSIS — N138 Other obstructive and reflux uropathy: Secondary | ICD-10-CM | POA: Diagnosis not present

## 2024-04-14 LAB — URINALYSIS, COMPLETE
Bilirubin, UA: NEGATIVE
Ketones, UA: NEGATIVE
Leukocytes,UA: NEGATIVE
Nitrite, UA: NEGATIVE
Specific Gravity, UA: 1.03 (ref 1.005–1.030)
Urobilinogen, Ur: 0.2 mg/dL (ref 0.2–1.0)
pH, UA: 6 (ref 5.0–7.5)

## 2024-04-14 LAB — MICROSCOPIC EXAMINATION

## 2024-04-14 NOTE — Progress Notes (Signed)
   04/14/24  CC:  Chief Complaint  Patient presents with   Cysto    HPI: Refer to Anthony Fuller's previous note 03/02/2024.  He declined contrast for the CT hematuria study.  Nonobstructing left renal calculi noted.  No hydronephrosis.  Blood pressure (!) 193/76, pulse (!) 118, height 6' 2 (1.88 m), weight (!) 314 lb (142.4 kg). NED. A&Ox3.   No respiratory distress   Abd soft, NT, ND Normal phallus with bilateral descended testicles  Cystoscopy Procedure Note  Patient identification was confirmed, informed consent was obtained, and patient was prepped using Betadine  solution.  Lidocaine  jelly was administered per urethral meatus.     Pre-Procedure: - Inspection reveals a normal caliber urethral meatus.  Procedure: The flexible cystoscope was introduced without difficulty - No urethral strictures/lesions are present. - Prominent lateral lobe enlargement prostate with hypervascularity/friability - Elevated bladder neck - Bilateral ureteral orifices identified - Bladder mucosa  reveals no ulcers, tumors, or lesions - No bladder stones - Mild trabeculation  Retroflexion shows no intravesical median lobe; backbleeding from prostate   Post-Procedure: - Patient tolerated the procedure well  Assessment/ Plan: No bladder tumor/mucosal abnormalities noted on cystoscopy BPH with hypervascularity/friability We discussed the most likely causes of his microhematuria are either BPH or stone disease Keep scheduled PA follow-up    Glendia JAYSON Barba, MD

## 2024-04-28 ENCOUNTER — Ambulatory Visit
Admission: EM | Admit: 2024-04-28 | Discharge: 2024-04-28 | Disposition: A | Discharge status: Home / Self Care | Attending: Emergency Medicine | Admitting: Emergency Medicine

## 2024-04-28 DIAGNOSIS — R051 Acute cough: Secondary | ICD-10-CM

## 2024-04-28 DIAGNOSIS — J101 Influenza due to other identified influenza virus with other respiratory manifestations: Secondary | ICD-10-CM

## 2024-04-28 LAB — POC SOFIA SARS ANTIGEN FIA: SARS Coronavirus 2 Ag: NEGATIVE

## 2024-04-28 LAB — POCT RAPID STREP A (OFFICE): Rapid Strep A Screen: NEGATIVE

## 2024-04-28 LAB — POCT INFLUENZA A/B
Influenza A, POC: POSITIVE — AB
Influenza B, POC: NEGATIVE

## 2024-04-28 MED ORDER — OSELTAMIVIR PHOSPHATE 75 MG PO CAPS: 75.0000 mg | ORAL_CAPSULE | Freq: Two times a day (BID) | ORAL | 0 refills | Status: AC

## 2024-04-28 MED ORDER — PROMETHAZINE-DM 6.25-15 MG/5ML PO SYRP: 5.0000 mL | ORAL_SOLUTION | Freq: Four times a day (QID) | ORAL | 0 refills | Status: AC | PRN

## 2024-04-28 MED ORDER — FLUTICASONE PROPIONATE 50 MCG/ACT NA SUSP: 2.0000 | Freq: Every day | NASAL | 0 refills | Status: AC

## 2024-04-28 MED ORDER — AEROCHAMBER MV MISC: 1 refills | Status: AC

## 2024-04-28 MED ORDER — ALBUTEROL SULFATE HFA 108 (90 BASE) MCG/ACT IN AERS: 1.0000 | INHALATION_SPRAY | RESPIRATORY_TRACT | 0 refills | Status: AC | PRN

## 2024-04-28 NOTE — Discharge Instructions (Signed)
 Influenza A positive . start Mucinex  to keep the mucous thin and to decongest you.   You may take 1000 mg of tylenol  up to 3 times a day as needed for pain. Use a NeilMed sinus rinse with distilled water as often as you want to to reduce nasal congestion. Follow the directions on the box.  Flonase  will help with nasal congestion.  Promethazine  DM for cough.  2 puffs from your albuterol  inhaler using your spacer every 4-6 hours as needed for coughing, wheezing, shortness of breath.  Finish the Tamiflu , even if you feel better.  Go to www.goodrx.com to look up your medications. This will give you a list of where you can find your prescriptions at the most affordable prices. Or you can ask the pharmacist what the cash price is. This is frequently cheaper than going through insurance.

## 2024-04-28 NOTE — ED Provider Notes (Signed)
 HPI  SUBJECTIVE:  Anthony Fuller is a 63 y.o. male who presents with 2 days of headache, productive cough, bilateral lower chest soreness from the cough, watery, nonbloody diarrhea, body aches, nasal congestion, clear extensive rhinorrhea, postnasal drip, sore throat from postnasal drip, sinus pain or pressure, wheezing, shortness of breath with exertion.  He is unable to sleep at night because of the cough.  He reports left ear pain and decreased hearing.  States that he has felt feverish but did not take his temperature.  No facial swelling, abdominal pain, nausea, vomiting, other abdominal pain.  His daughter was diagnosed with flu a few days ago.  He got 2 doses of COVID-vaccine and this year's flu vaccine.  No antibiotics in the past 3 months.  No antipyretic in the past 6 hours.  He tried Excedrin, leftover Promethazine  DM, TheraFlu and Mucinex .  The Excedrin and promethazine  DM help.  Symptoms worse with exertion.  He has a past medical history of kidney cancer status post right nephrectomy, cirrhosis, C. difficile infection, diabetes, hypertension, chronic kidney disease stage II, thrombocytopenia, pyelonephritis, sepsis, peptic ulcer disease, on Plavix , GI bleed, peptic ulcer disease.  No history of pulmonary disease, smoking.  PCP: [facility]  Past Medical History:  Diagnosis Date   Acid indigestion 02/14/2012   Anxiety    Arthritis    BIL.KNEES   Bulge of lumbar disc without myelopathy 09/05/2011   Cancer (HCC) 2012   R kidney removed   Cervical stenosis of spine    Cervicalgia    Chest wall pain    Cirrhosis of liver (HCC)    Clostridium difficile infection 02/04/2014   DDD (degenerative disc disease), lumbar    DDD (degenerative disc disease), lumbar    Degeneration of intervertebral disc of lumbar region 08/23/2011   Diabetes mellitus (HCC) 07/24/2011   Diabetes mellitus without complication (HCC)    Diabetic polyneuropathy associated with type 2 diabetes mellitus  (HCC)    Diarrhea    DM (diabetes mellitus) type 2, uncontrolled, with ketoacidosis (HCC) 05/30/2015   Dyspepsia    Dyspnea    Enthesopathy of hip 04/11/2015   Fatty liver disease, nonalcoholic 09/12/2014   GERD (gastroesophageal reflux disease)    Headache    Hematuria    Hepatic cirrhosis (HCC) 09/12/2014   Hydrocele    Hydrocele    Hypertension    IBS (irritable bowel syndrome)    Lumbar herniated disc    Lumbar radiculopathy 09/05/2011   Lumbar stenosis    NAFLD (nonalcoholic fatty liver disease)    Peroneal tendon injury, right, initial encounter    Peroneal tendon tear    Radiculopathy    Renal cancer (HCC)    Renal insufficiency    Right Nephrectomy due to RCC.   Sleep apnea    snores   Stenosis of cervical spine    Thoracic and lumbosacral neuritis 07/24/2011   Thrombocytopathia (HCC)    Thrombocytopenia     Past Surgical History:  Procedure Laterality Date   BACK SURGERY     cervical fusion C5-7   CARDIAC CATHETERIZATION Right 06/20/2015   Procedure: Left Heart Cath and Coronary Angiography;  Surgeon: Denyse DELENA Bathe, MD;  Location: [facility];  Service: Cardiovascular;  Laterality: Right;   COLONOSCOPY     CYSTOSCOPY W/ RETROGRADES Left 05/18/2015   Procedure: CYSTOSCOPY WITH RETROGRADE PYELOGRAM;  Surgeon: Redell Lynwood Napoleon, MD;  Location: [facility];  Service: Urology;  Laterality: Left;   CYSTOSCOPY WITH STENT PLACEMENT  Left 05/18/2015   Procedure: CYSTOSCOPY WITH STENT PLACEMENT;  Surgeon: Redell Lynwood Napoleon, MD;  Location: [facility];  Service: Urology;  Laterality: Left;   CYSTOSCOPY WITH STENT PLACEMENT Left 06/28/2015   Procedure: CYSTOSCOPY WITH STENT PLACEMENT/ EXCHANGE;  Surgeon: Rosina Riis, MD;  Location: [facility];  Service: Urology;  Laterality: Left;   ESOPHAGOGASTRODUODENOSCOPY (EGD) WITH PROPOFOL  N/A 09/03/2016   Procedure: ESOPHAGOGASTRODUODENOSCOPY (EGD) WITH PROPOFOL ;  Surgeon: Lamar ONEIDA Holmes, MD;  Location[facility];   Service: Endoscopy;  Laterality: N/A;   ESOPHAGOGASTRODUODENOSCOPY ENDOSCOPY N/A 03/14/2018   Procedure: ESOPHAGOGASTRODUODENOSCOPY ENDOSCOPY;  Surgeon: Holmes Lamar ONEIDA, MD;  Location: [facility];  Service: Endoscopy;  Laterality: N/A;   HALLUX VALGUS BASE WEDGE Right 08/23/2017   Procedure: HALLUX VALGUS BASE WEDGE;  Surgeon: Alishia Lebo Soulier, DPM;  Location: [facility];  Service: Podiatry;  Laterality: Right;   INSERTION OF MESH  02/27/2021   Procedure: INSERTION OF MESH;  Surgeon: Rodolph Romano, MD;  Location: [facility];  Service: General;;   JOINT REPLACEMENT     right knee    kidney removed Right    2012   NAFLD     NEPHRECTOMY Right 04/2011   ORIF TOE FRACTURE Right 08/23/2017   Procedure: OPEN REDUCTION INTERNAL FIXATION (ORIF) METATARSAL (TOE) FRACTURE;  Surgeon: Apollo Timothy Soulier, DPM;  Location: [facility];  Service: Podiatry;  Laterality: Right;   OSTEOTOMY     SPINE SURGERY     lumbar 2013   URETEROSCOPY WITH HOLMIUM LASER LITHOTRIPSY Left 05/18/2015   Procedure: URETEROSCOPY WITH HOLMIUM LASER LITHOTRIPSY;  Surgeon: Redell Lynwood Napoleon, MD;  Location: [facility];  Service: Urology;  Laterality: Left;   URETEROSCOPY WITH HOLMIUM LASER LITHOTRIPSY Left 06/28/2015   Procedure: URETEROSCOPY WITH HOLMIUM LASER LITHOTRIPSY;  Surgeon: Rosina Riis, MD;  Location: [facility];  Service: Urology;  Laterality: Left;    Family History  Problem Relation Age of Onset   Diabetes Mother    Hypertension Mother    Asthma Mother    Heart disease Father    Kidney disease Father    Diabetes Father    Stroke Father     Social History[1]  Current Medications[2]  Allergies[3]   ROS  As noted in HPI.   Physical Exam  BP (!) 149/79 (BP Location: Left Arm)   Pulse 80   Temp 98.5 F (36.9 C) (Oral)   Resp 18   Wt (!) 141.1 kg   SpO2 95%   BMI 39.93 kg/m   Constitutional: Well developed, well nourished, no acute distress.  Coughing. Eyes: PERRL, EOMI, conjunctiva normal  bilaterally HENT: Normocephalic, atraumatic,mucus membranes moist.  Clear nasal congestion.  Erythematous, swollen turbinates.  Positive maxillary sinus tenderness.  No frontal sinus tenderness.  TMs normal bilaterally.  Normal tonsils without exudates.  Uvula midline.  Positive postnasal drip. Neck: Positive cervical lymphadenopathy Respiratory: Occasional wheezing.  Positive exquisite anterior, lateral chest tenderness Cardiovascular: Normal rate and rhythm, no murmurs, no gallops, no rubs GI: nondistended skin: No rash, skin intact Musculoskeletal: no deformities Neurologic: Alert & oriented x 3, CN III-XII grossly intact, no motor deficits, sensation grossly intact Psychiatric: Speech and behavior appropriate   ED Course   Medications - No data to display  Orders Placed This Encounter  Procedures   POC rapid strep A    Standing Status:   Standing    Number of Occurrences:   1   POC Influenza A/B    Standing Status:   Standing    Number of Occurrences:   1  POC SARS Coronavirus Ag    Standing Status:   Standing    Number of Occurrences:   1   Results for orders placed or performed during the hospital encounter of 04/28/24 (from the past 24 hours)  POC rapid strep A     Status: Normal   Collection Time: 04/28/24  8:44 AM  Result Value Ref Range   Rapid Strep A Screen Negative Negative  POC Influenza A/B     Status: Abnormal   Collection Time: 04/28/24  8:44 AM  Result Value Ref Range   Influenza A, POC Positive (A) Negative   Influenza B, POC Negative Negative  POC SARS Coronavirus Ag     Status: Normal   Collection Time: 04/28/24  8:45 AM  Result Value Ref Range   SARS Coronavirus 2 Ag Negative Negative   No results found.  ED Clinical Impression  1. Influenza A   2. Acute cough      ED Assessment/Plan    Outside records, labs reviewed.  Additional medical history obtained.  Calculated creatinine clearance from outside labs done in September 2025 111  mL/min  Influenza A positive.  COVID, flu B and strep negative.  Discussed with patient while in department.  Patient presents with influenza A.  Home with Tamiflu , Flonase , Mucinex , saline nasal irrigation,  2 puffs from albuterol  inhaler with a spacer every 4-6 hours as needed for coughing, wheezing, shortness of breath, Tylenol  1000 mg 3 times a day.  No NSAIDs he has a history of peptic ulcer disease/GI bleed and is on Plavix .  No Tussionex as he has hypotension with Dilaudid.  Follow-up with PCP if not better in 5 days.  ER return precautions given.  [place] Narcotic database reviewed for this patient, and feel that the risk/benefit ratio today is favorable for proceeding with a prescription for controlled substance.  Last opiate prescription was tramadol  in November.  Discussed labs, MDM, treatment plan, and plan for follow-up with patient Discussed sn/sx that should prompt return to the ED. patient agrees with plan.   Meds ordered this encounter  Medications   oseltamivir  (TAMIFLU ) 75 MG capsule    Sig: Take 1 capsule (75 mg total) by mouth 2 (two) times daily. X 5 days    Dispense:  10 capsule    Refill:  0   fluticasone  (FLONASE ) 50 MCG/ACT nasal spray    Sig: Place 2 sprays into both nostrils daily.    Dispense:  16 g    Refill:  0   albuterol  (VENTOLIN  HFA) 108 (90 Base) MCG/ACT inhaler    Sig: Inhale 1-2 puffs into the lungs every 4 (four) hours as needed for wheezing or shortness of breath.    Dispense:  1 each    Refill:  0   Spacer/Aero-Holding Chambers (AEROCHAMBER MV) inhaler    Sig: Use as instructed    Dispense:  1 each    Refill:  1   promethazine -dextromethorphan  (PROMETHAZINE -DM) 6.25-15 MG/5ML syrup    Sig: Take 5 mLs by mouth 4 (four) times daily as needed for cough.    Dispense:  118 mL    Refill:  0      *This clinic note was created using Scientist, clinical (histocompatibility and immunogenetics). Therefore, there may be occasional mistakes despite careful proofreading. ?      [1]   Social History Tobacco Use   Smoking status: Never   Smokeless tobacco: Never  Vaping Use   Vaping status: Never Used  Substance Use Topics  Alcohol use: Not Currently   Drug use: No  [2] No current facility-administered medications for this encounter.  Current Outpatient Medications:    albuterol  (VENTOLIN  HFA) 108 (90 Base) MCG/ACT inhaler, Inhale 1-2 puffs into the lungs every 4 (four) hours as needed for wheezing or shortness of breath., Disp: 1 each, Rfl: 0   fluticasone  (FLONASE ) 50 MCG/ACT nasal spray, Place 2 sprays into both nostrils daily., Disp: 16 g, Rfl: 0   oseltamivir  (TAMIFLU ) 75 MG capsule, Take 1 capsule (75 mg total) by mouth 2 (two) times daily. X 5 days, Disp: 10 capsule, Rfl: 0   promethazine -dextromethorphan  (PROMETHAZINE -DM) 6.25-15 MG/5ML syrup, Take 5 mLs by mouth 4 (four) times daily as needed for cough., Disp: 118 mL, Rfl: 0   Spacer/Aero-Holding Chambers (AEROCHAMBER MV) inhaler, Use as instructed, Disp: 1 each, Rfl: 1   [Paused] cetirizine (ZYRTEC) 10 MG tablet, Take 10 mg by mouth daily as needed for allergies., Disp: , Rfl:    clopidogrel  (PLAVIX ) 75 MG tablet, Take 75 mg by mouth daily., Disp: , Rfl:    diclofenac  Sodium (VOLTAREN ) 1 % GEL, Apply 2 g topically 4 (four) times daily., Disp: , Rfl:    gabapentin  (NEURONTIN ) 400 MG capsule, Take 400 mg by mouth 3 (three) times daily., Disp: , Rfl:    insulin  NPH-regular Human (70-30) 100 UNIT/ML injection, Inject 55-60 Units into the skin See admin instructions. Inject 60 units in the morning and 55 units at night, Disp: , Rfl:    ipratropium (ATROVENT ) 0.06 % nasal spray, Place 2 sprays into both nostrils 4 (four) times daily., Disp: 15 mL, Rfl: 0   losartan (COZAAR) 50 MG tablet, Take 50 mg by mouth daily., Disp: , Rfl:    metFORMIN (GLUCOPHAGE-XR) 500 MG 24 hr tablet, Take 500-1,000 mg by mouth See admin instructions. Take 1000 mg in the morning and 500 mg in the evening, Disp: , Rfl:    methocarbamol  (ROBAXIN) 500 MG tablet, Take 500 mg by mouth every 8 (eight) hours as needed for muscle spasms., Disp: , Rfl:    NOVOLOG  MIX 70/30 FLEXPEN (70-30) 100 UNIT/ML FlexPen, INJECT 80 UNITS EVERY MORNING AND 75 UNITS EVERY EVENING., Disp: , Rfl:    omeprazole (PRILOSEC) 40 MG capsule, TAKE 1 CAPSULE BY MOUTH EVERY DAY, Disp: , Rfl:    rosuvastatin  (CRESTOR ) 5 MG tablet, Take 5 mg by mouth daily., Disp: , Rfl:    tiZANidine  (ZANAFLEX ) 4 MG capsule, Take 4 mg by mouth 3 (three) times daily., Disp: , Rfl:    [Paused] traMADol  (ULTRAM ) 50 MG tablet, Take 50 mg by mouth every 6 (six) hours as needed for moderate pain. for pain, Disp: , Rfl: 1 [3]  Allergies Allergen Reactions   Hydromorphone Other (See Comments)    Severe hypotension  Hypotension  Severe hypotension  Other Reaction(s): decreased blood pressure   Aspirin  Other (See Comments)    Hx GI bleed  GI bleeding  Other reaction(s): Other (See Comments)    Hx GI bleed    GI bleeding   Nsaids     Hx of GI bleed   Floxin [Ofloxacin] Rash     Van Knee, MD 04/29/24 1006

## 2024-04-28 NOTE — ED Triage Notes (Signed)
 Pt c/o cough,sore throat,bodyaches & HA x2 days. Has tried OTC meds w/o relief.

## 2024-05-27 ENCOUNTER — Telehealth: Payer: Self-pay

## 2024-05-27 NOTE — Telephone Encounter (Signed)
 Patient was called to schedule appointment for next week to discuss Testosterone  Therapy. Patient verbalized understanding of appointment date and time.  Andrea Kirks LPN

## 2024-06-02 NOTE — Progress Notes (Unsigned)
 "    06/03/2024 1:02 PM   Anthony Fuller 1961-02-24 [id number]  Referring provider: Eliverto Bette Hover, MD [address] [place],  [place] [address]  Urological history: 1. pT1aNX right renal cell carcinoma - right radical nephrectomy (2012) - mixed cystology with both cystic, papillary and clear cell variants   2. High risk hematuria - Non-smoker - CTU (2015) - Stable postsurgical changes of right nephrectomy without evidence of local recurrence or metastatic disease in the abdomen and pelvis. Multiple nonobstructing calculi in the left kidney.  - cysto (2015) - NED - non-contrast CT (02/2024) left nephrolithiasis - cysto (04/2024) BPH with hypervascularity/friability   3.  Nephrolithiasis - left URS (2017) - contrast CT (12/2023) -nonobstructive left nephrolithiasis  4. BPH with LU TS - PSA (01/2024) 0.68  5. ED - Failed PDE 5 inhibitors  No chief complaint on file.  HPI: Anthony Fuller is a 64 y.o. man who presents today for discussion regarding TRT.   Previous records reviewed.  He has been experiencing reduced energy, reduced endurance, diminished work performance, diminished physical performance, loss of body hair, reduced beard growth, fatigue, reduced lean muscle mass and obesity.  He has also noticed depressive symptoms, cognitive dysfunction, reduced motivation, poor concentration, poor memory and irritability.  There is also been a decreased in his libido and issues with erectile dysfunction.  He has a history of a prolonged period and sustained-release opioids, sleep apnea, and Type 2 diabetes mellitus which would contribute to low testosterone .  He has had two morning testosterones below 300 at least two days apart.  His LH and prolactin levels were normal.     PMH: Past Medical History:  Diagnosis Date   Acid indigestion 02/14/2012   Anxiety    Arthritis    BIL.KNEES   Bulge of lumbar disc without myelopathy 09/05/2011   Cancer (HCC) 2012   R kidney  removed   Cervical stenosis of spine    Cervicalgia    Chest wall pain    Cirrhosis of liver (HCC)    Clostridium difficile infection 02/04/2014   DDD (degenerative disc disease), lumbar    DDD (degenerative disc disease), lumbar    Degeneration of intervertebral disc of lumbar region 08/23/2011   Diabetes mellitus (HCC) 07/24/2011   Diabetes mellitus without complication (HCC)    Diabetic polyneuropathy associated with type 2 diabetes mellitus (HCC)    Diarrhea    DM (diabetes mellitus) type 2, uncontrolled, with ketoacidosis (HCC) 05/30/2015   Dyspepsia    Dyspnea    Enthesopathy of hip 04/11/2015   Fatty liver disease, nonalcoholic 09/12/2014   GERD (gastroesophageal reflux disease)    Headache    Hematuria    Hepatic cirrhosis (HCC) 09/12/2014   Hydrocele    Hydrocele    Hypertension    IBS (irritable bowel syndrome)    Lumbar herniated disc    Lumbar radiculopathy 09/05/2011   Lumbar stenosis    NAFLD (nonalcoholic fatty liver disease)    Peroneal tendon injury, right, initial encounter    Peroneal tendon tear    Radiculopathy    Renal cancer (HCC)    Renal insufficiency    Right Nephrectomy due to RCC.   Sleep apnea    snores   Stenosis of cervical spine    Thoracic and lumbosacral neuritis 07/24/2011   Thrombocytopathia (HCC)    Thrombocytopenia     Surgical History: Past Surgical History:  Procedure Laterality Date   BACK SURGERY     cervical fusion  C5-7   CARDIAC CATHETERIZATION Right 06/20/2015   Procedure: Left Heart Cath and Coronary Angiography;  Surgeon: Denyse DELENA Bathe, MD;  Location: [facility];  Service: Cardiovascular;  Laterality: Right;   COLONOSCOPY     CYSTOSCOPY W/ RETROGRADES Left 05/18/2015   Procedure: CYSTOSCOPY WITH RETROGRADE PYELOGRAM;  Surgeon: Redell Lynwood Napoleon, MD;  Location: [facility];  Service: Urology;  Laterality: Left;   CYSTOSCOPY WITH STENT PLACEMENT Left 05/18/2015   Procedure: CYSTOSCOPY WITH STENT PLACEMENT;   Surgeon: Redell Lynwood Napoleon, MD;  Location: [facility];  Service: Urology;  Laterality: Left;   CYSTOSCOPY WITH STENT PLACEMENT Left 06/28/2015   Procedure: CYSTOSCOPY WITH STENT PLACEMENT/ EXCHANGE;  Surgeon: Rosina Riis, MD;  Location: [facility];  Service: Urology;  Laterality: Left;   ESOPHAGOGASTRODUODENOSCOPY (EGD) WITH PROPOFOL  N/A 09/03/2016   Procedure: ESOPHAGOGASTRODUODENOSCOPY (EGD) WITH PROPOFOL ;  Surgeon: Lamar ONEIDA Holmes, MD;  Location[facility];  Service: Endoscopy;  Laterality: N/A;   ESOPHAGOGASTRODUODENOSCOPY ENDOSCOPY N/A 03/14/2018   Procedure: ESOPHAGOGASTRODUODENOSCOPY ENDOSCOPY;  Surgeon: Holmes Lamar ONEIDA, MD;  Location: [facility] ENDOSCOPY;  Service: Endoscopy;  Laterality: N/A;   HALLUX VALGUS BASE WEDGE Right 08/23/2017   Procedure: HALLUX VALGUS BASE WEDGE;  Surgeon: Ashley Soulier, DPM;  Location: [facility];  Service: Podiatry;  Laterality: Right;   INSERTION OF MESH  02/27/2021   Procedure: INSERTION OF MESH;  Surgeon: Rodolph Romano, MD;  Location: [facility];  Service: General;;   JOINT REPLACEMENT     right knee    kidney removed Right    2012   NAFLD     NEPHRECTOMY Right 04/2011   ORIF TOE FRACTURE Right 08/23/2017   Procedure: OPEN REDUCTION INTERNAL FIXATION (ORIF) METATARSAL (TOE) FRACTURE;  Surgeon: Ashley Soulier, DPM;  Location: [facility];  Service: Podiatry;  Laterality: Right;   OSTEOTOMY     SPINE SURGERY     lumbar 2013   URETEROSCOPY WITH HOLMIUM LASER LITHOTRIPSY Left 05/18/2015   Procedure: URETEROSCOPY WITH HOLMIUM LASER LITHOTRIPSY;  Surgeon: Redell Lynwood Napoleon, MD;  Location: [facility];  Service: Urology;  Laterality: Left;   URETEROSCOPY WITH HOLMIUM LASER LITHOTRIPSY Left 06/28/2015   Procedure: URETEROSCOPY WITH HOLMIUM LASER LITHOTRIPSY;  Surgeon: Rosina Riis, MD;  Location: [facility];  Service: Urology;  Laterality: Left;    Home Medications:  Allergies as of 06/03/2024       Reactions   Hydromorphone Other (See Comments)    Severe hypotension Hypotension  Severe hypotension Other Reaction(s): decreased blood pressure   Aspirin  Other (See Comments)   Hx GI bleed GI bleeding Other reaction(s): Other (See Comments)    Hx GI bleed    GI bleeding   Nsaids    Hx of GI bleed   Floxin [ofloxacin] Rash        Medication List        Accurate as of June 02, 2024  1:02 PM. If you have any questions, ask your nurse or doctor.          AeroChamber MV inhaler Use as instructed   albuterol  108 (90 Base) MCG/ACT inhaler Commonly known as: VENTOLIN  HFA Inhale 1-2 puffs into the lungs every 4 (four) hours as needed for wheezing or shortness of breath.   cetirizine 10 MG tablet Commonly known as: ZYRTEC Take 10 mg by mouth daily as needed for allergies.   clopidogrel  75 MG tablet Commonly known as: PLAVIX  Take 75 mg by mouth daily.   diclofenac  Sodium 1 % Gel Commonly known as: VOLTAREN  Apply 2 g  topically 4 (four) times daily.   fluticasone  50 MCG/ACT nasal spray Commonly known as: FLONASE  Place 2 sprays into both nostrils daily.   gabapentin  400 MG capsule Commonly known as: NEURONTIN  Take 400 mg by mouth 3 (three) times daily.   insulin  NPH-regular Human (70-30) 100 UNIT/ML injection Inject 55-60 Units into the skin See admin instructions. Inject 60 units in the morning and 55 units at night   ipratropium 0.06 % nasal spray Commonly known as: ATROVENT  Place 2 sprays into both nostrils 4 (four) times daily.   losartan 50 MG tablet Commonly known as: COZAAR Take 50 mg by mouth daily.   metFORMIN 500 MG 24 hr tablet Commonly known as: GLUCOPHAGE-XR Take 500-1,000 mg by mouth See admin instructions. Take 1000 mg in the morning and 500 mg in the evening   methocarbamol 500 MG tablet Commonly known as: ROBAXIN Take 500 mg by mouth every 8 (eight) hours as needed for muscle spasms.   NovoLOG  Mix 70/30 FlexPen (70-30) 100 UNIT/ML FlexPen Generic drug: insulin  aspart protamine -  aspart INJECT 80 UNITS EVERY MORNING AND 75 UNITS EVERY EVENING.   omeprazole 40 MG capsule Commonly known as: PRILOSEC TAKE 1 CAPSULE BY MOUTH EVERY DAY   oseltamivir  75 MG capsule Commonly known as: TAMIFLU  Take 1 capsule (75 mg total) by mouth 2 (two) times daily. X 5 days   promethazine -dextromethorphan  6.25-15 MG/5ML syrup Commonly known as: PROMETHAZINE -DM Take 5 mLs by mouth 4 (four) times daily as needed for cough.   rosuvastatin  5 MG tablet Commonly known as: CRESTOR  Take 5 mg by mouth daily.   tiZANidine  4 MG capsule Commonly known as: ZANAFLEX  Take 4 mg by mouth 3 (three) times daily.   traMADol  50 MG tablet Commonly known as: ULTRAM  Take 50 mg by mouth every 6 (six) hours as needed for moderate pain. for pain        Allergies:  Allergies  Allergen Reactions   Hydromorphone Other (See Comments)    Severe hypotension  Hypotension  Severe hypotension  Other Reaction(s): decreased blood pressure   Aspirin  Other (See Comments)    Hx GI bleed  GI bleeding  Other reaction(s): Other (See Comments)    Hx GI bleed    GI bleeding   Nsaids     Hx of GI bleed   Floxin [Ofloxacin] Rash    Family History: Family History  Problem Relation Age of Onset   Diabetes Mother    Hypertension Mother    Asthma Mother    Heart disease Father    Kidney disease Father    Diabetes Father    Stroke Father     Social History:  reports that he has never smoked. He has never used smokeless tobacco. He reports that he does not currently use alcohol. He reports that he does not use drugs.  ROS: Pertinent ROS in HPI  Physical Exam: There were no vitals taken for this visit.  Constitutional:  Well nourished. Alert and oriented, No acute distress. HEENT: [place] AT, moist mucus membranes.  Trachea midline Cardiovascular: No clubbing, cyanosis, or edema. Respiratory: Normal respiratory effort, no increased work of breathing. Neurologic: Grossly intact, no focal  deficits, moving all 4 extremities. Psychiatric: Normal mood and affect.   Laboratory Data: See HPI and EPIC I have reviewed the labs.   Pertinent Imaging: N/A  Assessment & Plan:    1. Hypogonadism  - explained that the diagnosis of testosterone  deficiency/hypogonadism requires two morning testosterones at least two days apart below  300 to meet criteria** - explained some insurances also require free and total testosterone , SHBG, FH/LH and prolactin levels as well  - explained some insurances will even require weight loss, managing diabetes, high blood pressure, sleep apnea and liver disease prior to covering testosterone  therapy - explained that TRT is not a treatment for ED, he may see some improvement in his erections, but his ED will likely persist even with therapeutic levels of testosterone  - discussed potential side effects of testosterone  replacement  including stimulation of erythrocytosis; edema; gynecomastia; worsening sleep apnea; venous thromboembolism; testicular atrophy and infertility.   The theoretical risk of growth stimulation of an undetected prostate cancer was also discussed.  He was informed that current evidence does not provide any definitive answers regarding the risks of testosterone  therapy on prostate cancer and cardiovascular disease. The need for periodic monitoring of his testosterone  level, PSA, hematocrit and DRE was discussed.  This monitoring will be conducted every three months during the first year of TRT and then every 6 months if blood work remains stable, if there is an abnormality found in follow up blood work, it will result in the monitoring of blood work more frequently  - advised that any missed or delayed appointments will also result in delays in the refilling of the TRT as it is a controlled substance - advised that the office requires one week to refill TRT - Near castrate testosterone  levels*** - Significant symptoms***feeling depressed or  tired, having little to no interest in sex, low energy and weak muscles or bones - Recommend starting TRT*** - We discussed the most common forms of replacement including intramuscular injection and gels and he desires to start injections*** - Rx testosterone  cypionate-200 mg every 2 weeks to start*** - Appointment will be made for injection training*** - Follow-up 5 weeks after starting TRT for testosterone  level and symptom check*** - discussed that we follow guidelines for age appropriate testosterone  levels to decide on dosing regimens for TRT and this is based on current agreements in the medical community and we will not deviate from this unless there is good scientific data to do so [facility] [employer] [facility] may not recognize the parameters we use to determine that the patient has low testosterone  and therefore, if they want to pursue TRT, it will have be out-of-pocket    20-39 years ([phone]) 40-49 ([phone]) (5.3-26.3) 50-59 ([phone]) (4.2-22.2) 60-69 ([phone]) (3.7-18.9) 70-     ([phone]) (2.2-14.7)         No follow-ups on file.  These notes generated with voice recognition software. I apologize for typographical errors.  CLOTILDA HELON RIGGERS  [facility] [address]  [address] [place], [place] [address] [phone]  "

## 2024-06-03 ENCOUNTER — Ambulatory Visit: Admitting: Urology

## 2024-06-03 DIAGNOSIS — E291 Testicular hypofunction: Secondary | ICD-10-CM

## 2024-07-14 ENCOUNTER — Ambulatory Visit: Admitting: Urology
